# Patient Record
Sex: Female | Born: 1940 | Race: White | Hispanic: No | Smoking: Former smoker
Health system: Southern US, Community
[De-identification: ages and names within clinical notes are randomized; demographics above are authoritative.]

## PROBLEM LIST (undated history)

## (undated) DIAGNOSIS — E119 Type 2 diabetes mellitus without complications: Secondary | ICD-10-CM

## (undated) DIAGNOSIS — M1990 Unspecified osteoarthritis, unspecified site: Secondary | ICD-10-CM

## (undated) DIAGNOSIS — E785 Hyperlipidemia, unspecified: Secondary | ICD-10-CM

## (undated) DIAGNOSIS — I10 Essential (primary) hypertension: Secondary | ICD-10-CM

## (undated) DIAGNOSIS — G4700 Insomnia, unspecified: Secondary | ICD-10-CM

## (undated) DIAGNOSIS — H269 Unspecified cataract: Secondary | ICD-10-CM

## (undated) DIAGNOSIS — M549 Dorsalgia, unspecified: Secondary | ICD-10-CM

## (undated) DIAGNOSIS — E039 Hypothyroidism, unspecified: Secondary | ICD-10-CM

## (undated) DIAGNOSIS — H3530 Unspecified macular degeneration: Secondary | ICD-10-CM

## (undated) DIAGNOSIS — R252 Cramp and spasm: Secondary | ICD-10-CM

## (undated) DIAGNOSIS — M797 Fibromyalgia: Secondary | ICD-10-CM

## (undated) DIAGNOSIS — J069 Acute upper respiratory infection, unspecified: Secondary | ICD-10-CM

## (undated) DIAGNOSIS — M2540 Effusion, unspecified joint: Secondary | ICD-10-CM

## (undated) DIAGNOSIS — R233 Spontaneous ecchymoses: Secondary | ICD-10-CM

## (undated) DIAGNOSIS — J42 Unspecified chronic bronchitis: Secondary | ICD-10-CM

## (undated) DIAGNOSIS — H9190 Unspecified hearing loss, unspecified ear: Secondary | ICD-10-CM

## (undated) DIAGNOSIS — IMO0001 Reserved for inherently not codable concepts without codable children: Secondary | ICD-10-CM

## (undated) DIAGNOSIS — K219 Gastro-esophageal reflux disease without esophagitis: Secondary | ICD-10-CM

## (undated) DIAGNOSIS — J302 Other seasonal allergic rhinitis: Secondary | ICD-10-CM

## (undated) DIAGNOSIS — J449 Chronic obstructive pulmonary disease, unspecified: Secondary | ICD-10-CM

## (undated) DIAGNOSIS — J4489 Other specified chronic obstructive pulmonary disease: Secondary | ICD-10-CM

## (undated) DIAGNOSIS — R238 Other skin changes: Secondary | ICD-10-CM

## (undated) DIAGNOSIS — S4290XA Fracture of unspecified shoulder girdle, part unspecified, initial encounter for closed fracture: Secondary | ICD-10-CM

## (undated) DIAGNOSIS — R3915 Urgency of urination: Secondary | ICD-10-CM

## (undated) DIAGNOSIS — J189 Pneumonia, unspecified organism: Secondary | ICD-10-CM

## (undated) DIAGNOSIS — J40 Bronchitis, not specified as acute or chronic: Secondary | ICD-10-CM

## (undated) DIAGNOSIS — G8929 Other chronic pain: Secondary | ICD-10-CM

## (undated) DIAGNOSIS — M2550 Pain in unspecified joint: Secondary | ICD-10-CM

## (undated) DIAGNOSIS — R519 Headache, unspecified: Secondary | ICD-10-CM

## (undated) DIAGNOSIS — C801 Malignant (primary) neoplasm, unspecified: Secondary | ICD-10-CM

## (undated) DIAGNOSIS — R51 Headache: Secondary | ICD-10-CM

## (undated) HISTORY — PX: BREAST BIOPSY: SHX20

## (undated) HISTORY — PX: JOINT REPLACEMENT: SHX530

## (undated) HISTORY — DX: Other specified chronic obstructive pulmonary disease: J44.89

## (undated) HISTORY — DX: Essential (primary) hypertension: I10

## (undated) HISTORY — DX: Gastro-esophageal reflux disease without esophagitis: K21.9

## (undated) HISTORY — DX: Chronic obstructive pulmonary disease, unspecified: J44.9

---

## 1970-02-16 HISTORY — PX: BREAST SURGERY: SHX581

## 2003-12-18 ENCOUNTER — Ambulatory Visit: Payer: Self-pay | Admitting: Critical Care Medicine

## 2004-04-18 ENCOUNTER — Ambulatory Visit: Payer: Self-pay | Admitting: Family Medicine

## 2004-04-25 ENCOUNTER — Ambulatory Visit: Payer: Self-pay | Admitting: Critical Care Medicine

## 2004-10-17 ENCOUNTER — Ambulatory Visit: Payer: Self-pay | Admitting: Critical Care Medicine

## 2004-12-25 ENCOUNTER — Ambulatory Visit: Payer: Self-pay | Admitting: Pulmonary Disease

## 2005-03-12 ENCOUNTER — Ambulatory Visit: Payer: Self-pay | Admitting: Critical Care Medicine

## 2005-04-07 ENCOUNTER — Ambulatory Visit: Payer: Self-pay | Admitting: Pulmonary Disease

## 2005-04-15 ENCOUNTER — Ambulatory Visit: Payer: Self-pay | Admitting: Critical Care Medicine

## 2005-05-25 ENCOUNTER — Ambulatory Visit: Payer: Self-pay | Admitting: Critical Care Medicine

## 2005-10-15 ENCOUNTER — Ambulatory Visit: Payer: Self-pay | Admitting: Critical Care Medicine

## 2005-11-23 ENCOUNTER — Ambulatory Visit: Payer: Self-pay | Admitting: Critical Care Medicine

## 2006-01-27 ENCOUNTER — Ambulatory Visit: Payer: Self-pay | Admitting: Critical Care Medicine

## 2006-02-10 ENCOUNTER — Ambulatory Visit: Payer: Self-pay | Admitting: Critical Care Medicine

## 2006-04-13 ENCOUNTER — Ambulatory Visit: Payer: Self-pay | Admitting: Critical Care Medicine

## 2006-05-13 ENCOUNTER — Ambulatory Visit: Payer: Self-pay | Admitting: Critical Care Medicine

## 2006-06-16 ENCOUNTER — Ambulatory Visit: Payer: Self-pay | Admitting: Pulmonary Disease

## 2006-10-07 ENCOUNTER — Ambulatory Visit: Payer: Self-pay | Admitting: Critical Care Medicine

## 2006-11-01 ENCOUNTER — Ambulatory Visit: Payer: Self-pay | Admitting: Critical Care Medicine

## 2006-11-02 DIAGNOSIS — K219 Gastro-esophageal reflux disease without esophagitis: Secondary | ICD-10-CM

## 2006-11-02 DIAGNOSIS — J449 Chronic obstructive pulmonary disease, unspecified: Secondary | ICD-10-CM

## 2006-12-07 ENCOUNTER — Ambulatory Visit: Payer: Self-pay | Admitting: Critical Care Medicine

## 2007-01-11 ENCOUNTER — Ambulatory Visit: Payer: Self-pay | Admitting: Critical Care Medicine

## 2007-02-23 ENCOUNTER — Ambulatory Visit: Payer: Self-pay | Admitting: Critical Care Medicine

## 2007-02-23 DIAGNOSIS — I10 Essential (primary) hypertension: Secondary | ICD-10-CM | POA: Insufficient documentation

## 2007-03-22 ENCOUNTER — Ambulatory Visit: Payer: Self-pay | Admitting: Critical Care Medicine

## 2007-04-13 ENCOUNTER — Ambulatory Visit: Payer: Self-pay | Admitting: Critical Care Medicine

## 2007-07-13 ENCOUNTER — Ambulatory Visit: Payer: Self-pay | Admitting: Critical Care Medicine

## 2007-08-16 ENCOUNTER — Ambulatory Visit: Payer: Self-pay | Admitting: Critical Care Medicine

## 2007-09-05 ENCOUNTER — Ambulatory Visit: Payer: Self-pay | Admitting: Internal Medicine

## 2007-09-29 ENCOUNTER — Ambulatory Visit: Payer: Self-pay | Admitting: Critical Care Medicine

## 2007-11-07 ENCOUNTER — Ambulatory Visit: Payer: Self-pay | Admitting: Critical Care Medicine

## 2007-12-14 ENCOUNTER — Ambulatory Visit: Payer: Self-pay | Admitting: Critical Care Medicine

## 2008-02-16 ENCOUNTER — Ambulatory Visit: Payer: Self-pay | Admitting: Critical Care Medicine

## 2008-04-12 ENCOUNTER — Telehealth (INDEPENDENT_AMBULATORY_CARE_PROVIDER_SITE_OTHER): Payer: Self-pay | Admitting: *Deleted

## 2008-04-25 ENCOUNTER — Ambulatory Visit: Payer: Self-pay | Admitting: Critical Care Medicine

## 2008-05-14 ENCOUNTER — Telehealth: Payer: Self-pay | Admitting: Adult Health

## 2008-05-17 ENCOUNTER — Ambulatory Visit: Payer: Self-pay | Admitting: Critical Care Medicine

## 2008-06-26 ENCOUNTER — Ambulatory Visit: Payer: Self-pay | Admitting: Critical Care Medicine

## 2008-06-28 ENCOUNTER — Telehealth: Payer: Self-pay | Admitting: Adult Health

## 2008-08-22 ENCOUNTER — Ambulatory Visit: Payer: Self-pay | Admitting: Internal Medicine

## 2008-08-22 ENCOUNTER — Ambulatory Visit: Payer: Self-pay | Admitting: Critical Care Medicine

## 2008-08-22 ENCOUNTER — Telehealth: Payer: Self-pay | Admitting: Adult Health

## 2008-09-12 ENCOUNTER — Ambulatory Visit: Payer: Self-pay | Admitting: Critical Care Medicine

## 2008-09-17 ENCOUNTER — Telehealth (INDEPENDENT_AMBULATORY_CARE_PROVIDER_SITE_OTHER): Payer: Self-pay | Admitting: *Deleted

## 2008-10-12 ENCOUNTER — Ambulatory Visit: Payer: Self-pay | Admitting: Pulmonary Disease

## 2008-11-14 ENCOUNTER — Telehealth: Payer: Self-pay | Admitting: Critical Care Medicine

## 2008-12-11 ENCOUNTER — Ambulatory Visit: Payer: Self-pay | Admitting: Critical Care Medicine

## 2008-12-31 ENCOUNTER — Telehealth: Payer: Self-pay | Admitting: Critical Care Medicine

## 2009-01-09 ENCOUNTER — Telehealth: Payer: Self-pay | Admitting: Critical Care Medicine

## 2009-01-09 ENCOUNTER — Encounter: Payer: Self-pay | Admitting: Critical Care Medicine

## 2009-02-04 ENCOUNTER — Ambulatory Visit: Payer: Self-pay | Admitting: Critical Care Medicine

## 2009-02-18 ENCOUNTER — Ambulatory Visit: Payer: Self-pay | Admitting: Cardiology

## 2009-02-18 ENCOUNTER — Ambulatory Visit: Payer: Self-pay | Admitting: Critical Care Medicine

## 2009-04-05 ENCOUNTER — Ambulatory Visit: Payer: Self-pay | Admitting: Critical Care Medicine

## 2009-05-09 ENCOUNTER — Ambulatory Visit: Payer: Self-pay | Admitting: Critical Care Medicine

## 2009-05-20 ENCOUNTER — Ambulatory Visit: Payer: Self-pay | Admitting: Critical Care Medicine

## 2009-05-27 ENCOUNTER — Telehealth (INDEPENDENT_AMBULATORY_CARE_PROVIDER_SITE_OTHER): Payer: Self-pay | Admitting: *Deleted

## 2009-06-10 ENCOUNTER — Ambulatory Visit: Payer: Self-pay | Admitting: Critical Care Medicine

## 2009-07-22 ENCOUNTER — Emergency Department: Payer: Self-pay | Admitting: Emergency Medicine

## 2009-08-06 ENCOUNTER — Ambulatory Visit: Payer: Self-pay | Admitting: Critical Care Medicine

## 2009-10-14 ENCOUNTER — Telehealth: Payer: Self-pay | Admitting: Critical Care Medicine

## 2009-10-29 ENCOUNTER — Telehealth (INDEPENDENT_AMBULATORY_CARE_PROVIDER_SITE_OTHER): Payer: Self-pay | Admitting: *Deleted

## 2009-11-11 ENCOUNTER — Ambulatory Visit: Payer: Self-pay | Admitting: Critical Care Medicine

## 2009-12-11 ENCOUNTER — Ambulatory Visit: Payer: Self-pay | Admitting: Critical Care Medicine

## 2010-03-07 ENCOUNTER — Ambulatory Visit
Admission: RE | Admit: 2010-03-07 | Discharge: 2010-03-07 | Payer: Self-pay | Source: Home / Self Care | Attending: Critical Care Medicine | Admitting: Critical Care Medicine

## 2010-03-18 NOTE — Progress Notes (Signed)
Summary: pt req neb order now/ pt at med supply  Phone Note Call from Patient   Caller: Patient Call For: wright Summary of Call: pt is at [facility] now, requesting that an ordere for new nebulizor as well as supplies/ kit for this be faxed to [phone] attn: nicki. her  (nicki) contact # at [facility] is: [phone].  pt needs this now as she will not be able to come back tomorrow (spouse will have surgery). call pt's cell at [phone]. NOTE: order needs to state how long pt needs to be on neb (pt states "forever") Initial call taken by: Tivis Ringer, CNA,  May 27, 2009 11:24 AM  Follow-up for Phone Call        Please advise if ok to place order for new neb machine and supplies. Pt staets her machine is from 2003 and is has broken. Pt aware PW in office tomorrow.[name] CMA  May 27, 2009 1:25 PM   Additional Follow-up for Phone Call Additional follow up Details #1::        this is ok  Additional Follow-up by: Storm Frisk MD,  May 27, 2009 2:19 PM    Additional Follow-up for Phone Call Additional follow up Details #2::    Order was sent to [facility]. Vernie Murders  May 27, 2009 2:38 PM

## 2010-03-18 NOTE — Progress Notes (Signed)
Summary: fyi  Phone Note Call from Patient   Caller: Patient Call For: wright Summary of Call: pt started taking avelox 09/29/09. pls add to med list Initial call taken by: Rickard Patience,  October 14, 2009 1:40 PM  Follow-up for Phone Call        Spoke with pt.  She states Dr. Delford Field gave her avelox rx to keep on hand.  Would like him to be aware she had to take it d/t sinusitis.  Started on 09/29/09 and finished on 10/03/09.  Will forward message to PW as FYI.   Follow-up by: Gweneth Dimitri RN,  October 14, 2009 1:51 PM  Additional Follow-up for Phone Call Additional follow up Details #1::        noted Additional Follow-up by: Storm Frisk MD,  October 14, 2009 3:55 PM

## 2010-03-18 NOTE — Assessment & Plan Note (Signed)
Summary: Pulmonary OV   Primary Provider/Referring Provider:  Dr. Leim Fabry in [place]  CC:  2 wk follow up.  states breathing is better and still having head congestion with very little green mucus and sinus pressure. Marland Kitchen  History of Present Illness: This is a 70 yo WF with hx of asthmatic bronchitis  10/28 At the last ov we Rx: Rocephin injection will be given Avelox for 7 days one daily  prednisone 10mg  for 12 days  4 each am x3days, 3 x 3days, 2 x 3days, 1 x 3days then stop ambien as needed sleep Pt now at baseline and markedly better.  February 16, 2008 9:49 AM no changes made at last OV two weeks of nose bleeds and nose is congested saline opens but not getting it out and now draining did not want H1N1 vaccine    April 25, 2008 10:06 AM Pt developed sinus infection symptoms and Augmentin called in 2/25.  Pt now better.  Less sinus drainage.  Less cough.  No chest pain. Dyspnea is better.  May 17, 2008--Presents for an acute office viist. Complains of prod cough with green/yellow mucus, increased SOB, head congestion with PND, wheezing x2weeks, made worse with the warm/dry weather.  --RX Augmentin x 7 d  Jun 26, 2008--Presents for acute office viist. Complains of  bronchitis - prod cough(green) - temp 101 last hs, increased productive cough for 1 week, green thick mucous, w/ fever. . No otc used. Previously trial of symbicort, did not tolerate due to sore mouth/tongue. Cant afford Spiriva- will place in [place].    August 22, 2008 --Presents for an acute office visit. prod cough with small amounts of green-yellow sputum, burning and soreness in bronchial tubes, sinus congestion x1day. Mild wheezing. Seen last visit, slow to resolve bronchitis required doxcycline and avelox w/ steroid taper. Symptoms resolved. Did well over last month until last 2 days. Temps outside  ~100 F. causing her breahting to be worse. Denies chest pain,  orthopnea, hemoptysis, fever, n/v/d,  edema, headache.  September 12, 2008 11:09 AM Hot weather is an issue now with dyspnea.    NP saw pt on 7/7 and rx: Levaquin 500mg  once daily for 7 days Mucinex DM two times a day as needed  Prednisone taper over next week.  CXR was neg.  Pt took [place] due to shakiness with prednisone and levaquin. Now has slight cough, no wheeze, no mucous now.    October 12, 2008  (ALVA) C/o hoarseness, cough  with yellow phlegm & 'burning in her bronchial tubes'. No wheezing, feels like her 'bronchitis is coming on'. No pain in chest area.   Pt denies  nasal congestion or excess secretions, fever, chills, sweats, unintended weight loss, pleurtic or exertional chest pain, orthopnea PND, or leg swelling   December 11, 2008 2:35 PM Pt saw Vassie Loll and had tussionex refilled and rx amoxicillin The pt has twice has had issues with cough and used mucinex DM and nasacort Will cough more overall,  cough is dry mostly.  Had hoarseness last week. If uses the Advair will make the pt cough Has insomnia as well and sister is ill and is under stress  February 04, 2009 9:58 AM Noting more cough and dyspnea.  This am with  severe cough paroxysms.  Neb Med help.  Coughs for No chest pain.  Burns in anterior chest area. Notes Headaches and eyes water.  Mucous out of nose is green.  Notes more wheeze. Is  using proair more.   February 18, 2009 9:43 AM Still has mucous in back of head, still with pn drip, notes some headaches. The pt notes the  top of head and over eyes is sore.   The pt now has one pred left. Her cough overall is better.  The dyspnea is better.  There is no chest pain.  The pt has not had a recent CT sinus.  Preventive Screening-Counseling & Management  Alcohol-Tobacco     Smoking Status: quit > 6 months  Current Medications (verified): 1)  Symbicort 160-4.5 Mcg/act Aero (Budesonide-Formoterol Fumarate) .... Inhale 2 Puffs Two Times A Day 2)  Fluticasone Propionate 50 Mcg/act Susp (Fluticasone  Propionate) .... 2 Puffs Two Times A Day 3)  Nexium 40 Mg  Cpdr (Esomeprazole Magnesium) .... One Daily 4)  Diclofenac Sodium 75 Mg  Tbec (Diclofenac Sodium) .... One By Mouth Twice Daily 5)  Zolpidem Tartrate 10 Mg Tabs (Zolpidem Tartrate) .... At Bedtime 6)  Triamterene-Hctz 37.5-25 Mg  Caps (Triamterene-Hctz) .... One By Mouth Once Daily 7)  Proair Hfa 108 (90 Base) Mcg/act  Aers (Albuterol Sulfate) .... One To 2 Puffs  Every 4 Hours As Needed 8)  Nystatin 100000 Unit/ml  Susp (Nystatin) .... 5 Ccc Three Times A Day As Needed 9)  Tussionex Pennkinetic Er 8-10 Mg/17ml  Lqcr (Chlorpheniramine-Hydrocodone) .Marland Kitchen.. 1 Tsp Every 12 Hrs As Needed Cough 10)  Mucinex Dm Maximum Strength 60-1200 Mg Xr12h-Tab (Dextromethorphan-Guaifenesin) .Marland Kitchen.. 1 By Mouth Two Times A Day As Needed 11)  Prednisone 10 Mg  Tabs (Prednisone) .... Take As Directed 4 Each Am X 4 Days, 3 X 4 Days, 2 X 4 Days, 1 X 4 Days Then Stop 12)  Amlodipine Besylate 5 Mg Tabs (Amlodipine Besylate) .... Once Daily  Allergies (verified): No Known Drug Allergies  Past History:  Past medical, surgical, family and social histories (including risk factors) reviewed, and no changes noted (except as noted below).  Past Medical History: BRONCHITIS, ACUTE (ICD-466.0) HYPERTENSION (ICD-401.9) GERD (ICD-530.81) COPD (ICD-496)  -FeV1 67%  DLCO 65%  2007  -CT Sinus neg 02/2009  Past Pulmonary History:  Pulmonary History: CT Sinus neg 02/2009  Family History: Reviewed history from 02/23/2007 and no changes required. MI/Heart Attack  Social History: Reviewed history from 09/05/2007 and no changes required. Patient states former smoker.  Divorced (currently engaged) 1 son Retired-prev. worked at [employer]  Review of Systems       The patient complains of shortness of breath with activity, non-productive cough, chest pain, headaches, and nasal congestion/difficulty breathing through nose.  The patient denies shortness of breath at rest,  productive cough, coughing up blood, irregular heartbeats, acid heartburn, indigestion, loss of appetite, weight change, abdominal pain, difficulty swallowing, sore throat, tooth/dental problems, sneezing, itching, ear ache, anxiety, depression, hand/feet swelling, joint stiffness or pain, rash, change in color of mucus, and fever.    Vital Signs:  Patient profile:   70 year old female Height:      64 inches Weight:      177.25 pounds BMI:     30.53 O2 Sat:      96 % on Room air Temp:     97.5 degrees F oral Pulse rate:   86 / minute BP sitting:   136 / 90  (left arm) Cuff size:   regular  Vitals Entered By: Gweneth Dimitri RN (February 18, 2009 9:37 AM)  O2 Flow:  Room air CC: 2 wk follow up.  states breathing is better,  still having head congestion with very little green mucus and sinus pressure.  Comments Medications reviewed with patient Gweneth Dimitri RN  February 18, 2009 9:37 AM    Physical Exam  Additional Exam:  Gen. Pleasant, well-nourished, in no distress ENT - improved purulence but persistent erythema and nasal narrowing  Neck: No JVD, no thyromegaly, no carotid bruits Lungs: no use of accessory muscles, no dullness to percussion, improved airflow Cardiovascular: Rhythm regular, heart sounds  normal, no murmurs or gallops, no peripheral edema Musculoskeletal: No deformities, no cyanosis or clubbing      Impression & Recommendations:  Problem # 1:  OTHER ACUTE SINUSITIS (ICD-461.8) Assessment Improved Acute sinusitis  ? chronic component however CT Sinus performed today 02/18/09 was neg for chronic disease plan cont nasal hygiene finish prednisone no further ABX for now  Her updated medication list for this problem includes:    Fluticasone Propionate 50 Mcg/act Susp (Fluticasone propionate) .Marland Kitchen... 2 puffs once daily    Tussionex Pennkinetic Er 8-10 Mg/4ml Lqcr (Chlorpheniramine-hydrocodone) .Marland Kitchen... 1 tsp every 12 hrs as needed cough    Mucinex Dm Maximum Strength  60-1200 Mg Xr12h-tab (Dextromethorphan-guaifenesin) .Marland Kitchen... 1 by mouth two times a day as needed  Orders: Est. Patient Level IV (16109) Prescription Created Electronically [phone]) Radiology Referral (Radiology)  Problem # 2:  COPD (ICD-496) Assessment: Unchanged Stable COPD  with  AB flare now improved,  no insurance coverage for [facility] available so pt to continue symbicort due to sinusitis plan No change in inhaled medications.   Maintain treatment program as currently prescribed.  Medications Added to Medication List This Visit: 1)  Fluticasone Propionate 50 Mcg/act Susp (Fluticasone propionate) .... 2 puffs once daily 2)  Zolpidem Tartrate 10 Mg Tabs (Zolpidem tartrate) .... At bedtime 3)  Amlodipine Besylate 5 Mg Tabs (Amlodipine besylate) .... Once daily  Complete Medication List: 1)  Symbicort 160-4.5 Mcg/act Aero (Budesonide-formoterol fumarate) .... Inhale 2 puffs two times a day 2)  Fluticasone Propionate 50 Mcg/act Susp (Fluticasone propionate) .... 2 puffs once daily 3)  Nexium 40 Mg Cpdr (Esomeprazole magnesium) .... One daily 4)  Diclofenac Sodium 75 Mg Tbec (Diclofenac sodium) .... One by mouth twice daily 5)  Zolpidem Tartrate 10 Mg Tabs (Zolpidem tartrate) .... At bedtime 6)  Triamterene-hctz 37.5-25 Mg Caps (Triamterene-hctz) .... One by mouth once daily 7)  Proair Hfa 108 (90 Base) Mcg/act Aers (Albuterol sulfate) .... One to 2 puffs  every 4 hours as needed 8)  Nystatin 100000 Unit/ml Susp (Nystatin) .... 5 ccc three times a day as needed 9)  Tussionex Pennkinetic Er 8-10 Mg/70ml Lqcr (Chlorpheniramine-hydrocodone) .Marland Kitchen.. 1 tsp every 12 hrs as needed cough 10)  Mucinex Dm Maximum Strength 60-1200 Mg Xr12h-tab (Dextromethorphan-guaifenesin) .Marland Kitchen.. 1 by mouth two times a day as needed 11)  Prednisone 10 Mg Tabs (Prednisone) .... Take as directed 4 each am x 4 days, 3 x 4 days, 2 x 4 days, 1 x 4 days then stop 12)  Amlodipine Besylate 5 Mg Tabs (Amlodipine besylate) .... Once  daily  Patient Instructions: 1)  A CT Scan of the Sinuses will be scheduled at [place]  2)  Stay on symbicort,  do not refill Dulera 3)  Stay on fluticasone/nasacort two sprays once a day each nostril 4)  Return one month  Prescriptions: FLUTICASONE PROPIONATE 50 MCG/ACT SUSP (FLUTICASONE PROPIONATE) 2 puffs two times a day  #1 month x 6   Entered and Authorized by:   Storm Frisk MD   Signed by:  Storm Frisk MD on 02/18/2009   Method used:   Electronically to        [facility] (retail)       [address]       [place], [place]  [id number]       Ph: [phone]       Fax: [phone]   RxID:   [id number]

## 2010-03-18 NOTE — Assessment & Plan Note (Signed)
Summary: Pulmonary OV   Primary Provider/Referring Provider:  Dr. Leim Fabry in [place]  CC:  2 wk follow up.  States breathing is doing well overall.  states gerd is better.  states she does have a prod cough with a small amount of thick white mucus.  States she is able to get  up a lot more mucus after doing neb treatments - still thick white.  Marland Kitchen  History of Present Illness: This is a 70 yo WF with hx of asthmatic bronchitis   June 10, 2009 9:49 AM At last ov we: Change flonase to veramyst two sprays each nostril daily Stop Nexium Use Dexilant one daily Follow a strict Reflux Diet Since last ov,  lost 4# and was eating poorly,  still with thick mucous,  had sore throat last week,  muocus is white,  is coughing more,  some sl green to white,. If does a neb will get up mucous  .   Preventive Screening-Counseling & Management  Alcohol-Tobacco     Smoking Status: quit > 6 months  Current Medications (verified): 1)  Symbicort 160-4.5 Mcg/act Aero (Budesonide-Formoterol Fumarate) .... Inhale 2 Puffs Two Times A Day 2)  Veramyst 27.5 Mcg/spray  Susp (Fluticasone Furoate) .... Two Puffs Each Nostril Daily 3)  Dexilant 60 Mg Cpdr (Dexlansoprazole) .... One By Mouth Daily 4)  Diclofenac Sodium 75 Mg  Tbec (Diclofenac Sodium) .... One By Mouth Twice Daily 5)  Zolpidem Tartrate 10 Mg Tabs (Zolpidem Tartrate) .... At Bedtime 6)  Triamterene-Hctz 37.5-25 Mg  Caps (Triamterene-Hctz) .... One By Mouth Once Daily 7)  Proair Hfa 108 (90 Base) Mcg/act  Aers (Albuterol Sulfate) .... One To 2 Puffs  Every 4 Hours As Needed 8)  Nystatin 100000 Unit/ml  Susp (Nystatin) .... 5 Ccc Three Times A Day As Needed 9)  Tussionex Pennkinetic Er 8-10 Mg/100ml  Lqcr (Chlorpheniramine-Hydrocodone) .Marland Kitchen.. 1 Tsp Every 12 Hrs As Needed Cough 10)  Mucinex Dm Maximum Strength 60-1200 Mg Xr12h-Tab (Dextromethorphan-Guaifenesin) .Marland Kitchen.. 1 By Mouth Two Times A Day As Needed 11)  Amlodipine Besylate 5 Mg Tabs  (Amlodipine Besylate) .... Once Daily 12)  Ipratropium Bromide 0.02 % Soln (Ipratropium Bromide) .Marland Kitchen.. 1 Vial in Nebulizer Two Times A Day and As Needed 13)  Albuterol Sulfate (2.5 Mg/34ml) 0.083% Nebu (Albuterol Sulfate) .Marland Kitchen.. 1 in Nebulizer With Ipratropium Two Times A Day As Needed  Allergies (verified): 1)  ! Prednisone  Past History:  Past medical, surgical, family and social histories (including risk factors) reviewed, and no changes noted (except as noted below).  Past Medical History: Reviewed history from 02/18/2009 and no changes required. BRONCHITIS, ACUTE (ICD-466.0) HYPERTENSION (ICD-401.9) GERD (ICD-530.81) COPD (ICD-496)  -FeV1 67%  DLCO 65%  2007  -CT Sinus neg 02/2009  Past Pulmonary History:  Pulmonary History: CT Sinus neg 02/2009  Family History: Reviewed history from 02/23/2007 and no changes required. MI/Heart Attack  Social History: Reviewed history from 09/05/2007 and no changes required. Patient states former smoker.  Divorced (currently engaged) 1 son Retired-prev. worked at [employer]  Review of Systems       The patient complains of shortness of breath with activity, productive cough, and nasal congestion/difficulty breathing through nose.  The patient denies shortness of breath at rest, non-productive cough, coughing up blood, chest pain, irregular heartbeats, acid heartburn, indigestion, loss of appetite, weight change, abdominal pain, difficulty swallowing, sore throat, tooth/dental problems, headaches, sneezing, itching, ear ache, anxiety, depression, hand/feet swelling, joint stiffness or pain, rash, change in color of mucus, and  fever.    Vital Signs:  Patient profile:   70 year old female Height:      64 inches Weight:      167.50 pounds BMI:     28.86 O2 Sat:      95 % on Room air Temp:     97.6 degrees F oral Pulse rate:   75 / minute BP sitting:   118 / 78  (left arm) Cuff size:   regular  Vitals Entered By: Gweneth Dimitri RN (June 10, 2009 9:40 AM)  O2 Flow:  Room air  CC: 2 wk follow up.  States breathing is doing well overall.  states gerd is better.  states she does have a prod cough with a small amount of thick white mucus.  States she is able to get  up a lot more mucus after doing neb treatments - still thick white.   Comments Medications reviewed with patient Daytime contact number verified with patient. Gweneth Dimitri RN  June 10, 2009 9:45 AM    Physical Exam  Additional Exam:  Gen. Pleasant, well-nourished, in no distress ENT - improved purulence but persistent erythema and nasal narrowing  Neck: No JVD, no thyromegaly, no carotid bruits Lungs: no use of accessory muscles, no dullness to percussion, improved airflow.   Cardiovascular: Rhythm regular, heart sounds  normal, no murmurs or gallops, no peripheral edema Musculoskeletal: No deformities, no cyanosis or clubbing      Impression & Recommendations:  Problem # 1:  COPD (ICD-496) Assessment Improved stable chronic obstructive lung disease with decrease airway inflammation reflux disease is likely playing a role. Plan Continue reflux treatment. Continue inhaled medications as prescribed  Medications Added to Medication List This Visit: 1)  Flonase 50 Mcg/act Susp (Fluticasone propionate) .... Two puffs each nostril daily 2)  Nexium 40 Mg Cpdr (Esomeprazole magnesium) .... By mouth daily. take one half hour before eating.  Complete Medication List: 1)  Symbicort 160-4.5 Mcg/act Aero (Budesonide-formoterol fumarate) .... Inhale 2 puffs two times a day 2)  Flonase 50 Mcg/act Susp (Fluticasone propionate) .... Two puffs each nostril daily 3)  Nexium 40 Mg Cpdr (Esomeprazole magnesium) .... By mouth daily. take one half hour before eating. 4)  Diclofenac Sodium 75 Mg Tbec (Diclofenac sodium) .... One by mouth twice daily 5)  Zolpidem Tartrate 10 Mg Tabs (Zolpidem tartrate) .... At bedtime 6)  Triamterene-hctz 37.5-25 Mg Caps (Triamterene-hctz) ....  One by mouth once daily 7)  Proair Hfa 108 (90 Base) Mcg/act Aers (Albuterol sulfate) .... One to 2 puffs  every 4 hours as needed 8)  Nystatin 100000 Unit/ml Susp (Nystatin) .... 5 ccc three times a day as needed 9)  Tussionex Pennkinetic Er 8-10 Mg/13ml Lqcr (Chlorpheniramine-hydrocodone) .Marland Kitchen.. 1 tsp every 12 hrs as needed cough 10)  Mucinex Dm Maximum Strength 60-1200 Mg Xr12h-tab (Dextromethorphan-guaifenesin) .Marland Kitchen.. 1 by mouth two times a day as needed 11)  Amlodipine Besylate 5 Mg Tabs (Amlodipine besylate) .... Once daily 12)  Ipratropium Bromide 0.02 % Soln (Ipratropium bromide) .Marland Kitchen.. 1 vial in nebulizer two times a day and as needed 13)  Albuterol Sulfate (2.5 Mg/40ml) 0.083% Nebu (Albuterol sulfate) .Marland Kitchen.. 1 in nebulizer with ipratropium two times a day as needed  Other Orders: Est. Patient Level III (04540)  Patient Instructions: 1)  May resume flonase and nexium 2)  Continue reflux diet 3)  Use avelox samples one daily if green mucous continues 4)  Use nebulizer three times daily 5)  Return 2 months

## 2010-03-18 NOTE — Assessment & Plan Note (Signed)
Summary: Pulmonary OV   Primary Provider/Referring Provider:  Dr. Leim Fabry in [place]  CC:  Acute Visit.  PND, wheezing, increased SOB, and prod cough with beige/green mucus since Saturday.  Denies f/c/s.Marland Kitchen  History of Present Illness: This is a 70 yo WF with hx of asthmatic bronchitis  August 06, 2009 11:05 AM Had to fill avelox last week for bronchitis.  Has had two flareups since 4/11.  Self Rx pred pulse  at first and got better.   Then fell and fx arm on a sunday at church and went to ED.  Then one week ago was at the [facility] and got worse with hoarseness and felt worse and then took 5 d of avelox.  Now is better.  Fx is in the humerus.  Being treated conservatively. Now:  notes sl wheeze at night,  will cough occasionally.  Ortho is Dr [facility] in [place].  November 11, 2009 2:57 PM Note onset pn drip and burning and drainage in throat.  cough is more productive beige green.  Gets ill fast.  Notes more dyspnea, not bad.  No f/c/s.  Notes sinus pressure over R eye. Cough is worse.  No chest pain.  No f/c/s.    Preventive Screening-Counseling & Management  Alcohol-Tobacco     Smoking Status: quit > 6 months     Year Quit: 2003     Pack years: 77yrs, 2 ppd  Current Medications (verified): 1)  Symbicort 160-4.5 Mcg/act Aero (Budesonide-Formoterol Fumarate) .... Inhale 2 Puffs Two Times A Day 2)  Flonase 50 Mcg/act  Susp (Fluticasone Propionate) .... Two Puffs Each Nostril Daily 3)  Nexium 40 Mg  Cpdr (Esomeprazole Magnesium) .... By Mouth Daily. Take One Half Hour Before Eating. 4)  Diclofenac Sodium 75 Mg  Tbec (Diclofenac Sodium) .... One By Mouth Twice Daily 5)  Zolpidem Tartrate 10 Mg Tabs (Zolpidem Tartrate) .... At Bedtime 6)  Triamterene-Hctz 37.5-25 Mg  Caps (Triamterene-Hctz) .... One By Mouth Once Daily 7)  Proair Hfa 108 (90 Base) Mcg/act  Aers (Albuterol Sulfate) .... One To 2 Puffs  Every 4 Hours As Needed 8)  Nystatin 100000 Unit/ml  Susp (Nystatin) .... 5  Ccc Three Times A Day As Needed 9)  Tussionex Pennkinetic Er 8-10 Mg/55ml  Lqcr (Chlorpheniramine-Hydrocodone) .Marland Kitchen.. 1 Tsp Every 12 Hrs As Needed Cough 10)  Mucinex Dm Maximum Strength 60-1200 Mg Xr12h-Tab (Dextromethorphan-Guaifenesin) .Marland Kitchen.. 1 By Mouth Two Times A Day As Needed 11)  Amlodipine Besylate 5 Mg Tabs (Amlodipine Besylate) .... Once Daily 12)  Ipratropium Bromide 0.02 % Soln (Ipratropium Bromide) .Marland Kitchen.. 1 Vial in Nebulizer Two Times A Day and As Needed 13)  Albuterol Sulfate (2.5 Mg/42ml) 0.083% Nebu (Albuterol Sulfate) .Marland Kitchen.. 1 in Nebulizer With Ipratropium Two Times A Day As Needed 14)  Hydrocodone-Acetaminophen 5-325 Mg Tabs (Hydrocodone-Acetaminophen) .... As Needed  Allergies (verified): 1)  ! Prednisone  Past History:  Past medical, surgical, family and social histories (including risk factors) reviewed, and no changes noted (except as noted below).  Past Medical History: Reviewed history from 02/18/2009 and no changes required. BRONCHITIS, ACUTE (ICD-466.0) HYPERTENSION (ICD-401.9) GERD (ICD-530.81) COPD (ICD-496)  -FeV1 67%  DLCO 65%  2007  -CT Sinus neg 02/2009  Past Pulmonary History:  Pulmonary History: CT Sinus neg 02/2009  Family History: Reviewed history from 02/23/2007 and no changes required. MI/Heart Attack  Social History: Reviewed history from 09/05/2007 and no changes required. Patient states former smoker.  Quit in 2003.  2 ppd x 30 yrs.   Divorced (  currently engaged) 1 son Retired-prev. worked at [employer]  Review of Systems       The patient complains of shortness of breath with activity, shortness of breath at rest, productive cough, non-productive cough, nasal congestion/difficulty breathing through nose, and change in color of mucus.  The patient denies coughing up blood, chest pain, irregular heartbeats, acid heartburn, indigestion, loss of appetite, weight change, abdominal pain, difficulty swallowing, sore throat, tooth/dental problems,  headaches, sneezing, itching, ear ache, anxiety, depression, hand/feet swelling, joint stiffness or pain, rash, and fever.    Vital Signs:  Patient profile:   70 year old female Height:      64 inches O2 Sat:      89 % on Room air Temp:     98.4 degrees F oral Pulse rate:   87 / minute BP sitting:   120 / 88  (left arm) Cuff size:   regular  Vitals Entered By: Gweneth Dimitri RN (November 11, 2009 2:49 PM)  O2 Flow:  Room air  O2 Sat Comments Pt arrived to exam room with o2 sat 89% RA.  After resting, o2 sat increased to 96% RA with pulse of 86. Gweneth Dimitri RN  November 11, 2009 2:52 PM  CC: Acute Visit.  PND, wheezing, increased SOB, prod cough with beige/green mucus since Saturday.  Denies f/c/s. Comments Medications reviewed with patient Daytime contact number verified with patient. Gweneth Dimitri RN  November 11, 2009 2:50 PM    Physical Exam  Additional Exam:  Gen. Pleasant, well-nourished, in no distress ENT - improved purulence but persistent erythema and nasal narrowing  Neck: No JVD, no thyromegaly, no carotid bruits Lungs: no use of accessory muscles, no dullness to percussion, expir wheeze, poor airflow Cardiovascular: Rhythm regular, heart sounds  normal, no murmurs or gallops, no peripheral edema Musculoskeletal: No deformities, no cyanosis or clubbing      Impression & Recommendations:  Problem # 1:  ACUTE BRONCHITIS (ICD-466.0) Assessment Deteriorated acute tracheobronchitis and early sinusitis plan avelox 400mg /d x 5days No change in inhaled medications.   Maintain treatment program as currently prescribed.  Medications Added to Medication List This Visit: 1)  Albuterol Sulfate (2.5 Mg/56ml) 0.083% Nebu (Albuterol sulfate) .Marland Kitchen.. 1 in nebulizer with ipratropium two times a day and  as needed 2)  Nexium 40 Mg Cpdr (Esomeprazole magnesium) .... By mouth daily. take one half hour before eating. 3)  Tussionex Pennkinetic Er 8-10 Mg/40ml Lqcr  (Chlorpheniramine-hydrocodone) .Marland Kitchen.. 1 tsp every 12 hrs as needed cough 4)  Hydrocodone-acetaminophen 5-325 Mg Tabs (Hydrocodone-acetaminophen) .... As needed not taking 5)  Avelox 400 Mg Tabs (Moxifloxacin hcl) .... By mouth daily  Complete Medication List: 1)  Albuterol Sulfate (2.5 Mg/79ml) 0.083% Nebu (Albuterol sulfate) .Marland Kitchen.. 1 in nebulizer with ipratropium two times a day and  as needed 2)  Ipratropium Bromide 0.02 % Soln (Ipratropium bromide) .Marland Kitchen.. 1 vial in nebulizer two times a day and as needed 3)  Amlodipine Besylate 5 Mg Tabs (Amlodipine besylate) .... Once daily 4)  Symbicort 160-4.5 Mcg/act Aero (Budesonide-formoterol fumarate) .... Inhale 2 puffs two times a day 5)  Flonase 50 Mcg/act Susp (Fluticasone propionate) .... Two puffs each nostril daily 6)  Nexium 40 Mg Cpdr (Esomeprazole magnesium) .... By mouth daily. take one half hour before eating. 7)  Diclofenac Sodium 75 Mg Tbec (Diclofenac sodium) .... One by mouth twice daily 8)  Zolpidem Tartrate 10 Mg Tabs (Zolpidem tartrate) .... At bedtime 9)  Triamterene-hctz 37.5-25 Mg Caps (Triamterene-hctz) .... One by mouth once  daily 10)  Tussionex Pennkinetic Er 8-10 Mg/34ml Lqcr (Chlorpheniramine-hydrocodone) .Marland Kitchen.. 1 tsp every 12 hrs as needed cough 11)  Mucinex Dm Maximum Strength 60-1200 Mg Xr12h-tab (Dextromethorphan-guaifenesin) .Marland Kitchen.. 1 by mouth two times a day as needed 12)  Hydrocodone-acetaminophen 5-325 Mg Tabs (Hydrocodone-acetaminophen) .... As needed not taking 13)  Proair Hfa 108 (90 Base) Mcg/act Aers (Albuterol sulfate) .... One to 2 puffs  every 4 hours as needed 14)  Nystatin 100000 Unit/ml Susp (Nystatin) .... 5 ccc three times a day as needed 15)  Avelox 400 Mg Tabs (Moxifloxacin hcl) .... By mouth daily  Other Orders: Est. Patient Level IV (47829) Prescription Created Electronically [phone])  Patient Instructions: 1)  Take Avelox one daily for 5days 2)  No other medication changes 3)  Return one month    Prescriptions: TUSSIONEX PENNKINETIC ER 8-10 MG/5ML  LQCR (CHLORPHENIRAMINE-HYDROCODONE) 1 tsp every 12 hrs as needed cough  #240 ML x 3   Entered and Authorized by:   Storm Frisk MD   Signed by:   Storm Frisk MD on 11/11/2009   Method used:   Print then Give to Patient   RxID:   [id number] NEXIUM 40 MG  CPDR (ESOMEPRAZOLE MAGNESIUM) By mouth daily. Take one half hour before eating.  #30 x 6   Entered and Authorized by:   Storm Frisk MD   Signed by:   Storm Frisk MD on 11/11/2009   Method used:   Electronically to        [facility] (retail)       [address]       [place], [place]  [id number]       Ph: [phone]       Fax: [phone]   RxID:   [id number] AVELOX 400 MG  TABS (MOXIFLOXACIN HCL) By mouth daily  #4 x 0   Entered and Authorized by:   Storm Frisk MD   Signed by:   Storm Frisk MD on 11/11/2009   Method used:   Electronically to        [facility] (retail)       [address]       [place], [place]  [id number]       Ph: [phone]       Fax: [phone]   RxID:   [id number]     Appended Document: Pulmonary OV fax Leim Fabry [place]

## 2010-03-18 NOTE — Assessment & Plan Note (Signed)
Summary: Pulmonary OV   Primary Provider/Referring Provider:  Dr. Leim Fabry in [place]  CC:  1 month follow up.  "some SOB in the evenings but overall breathing doing fine."  Nonprod cough.  Denies wheezing and chest tightness.  Marland Kitchen  History of Present Illness: This is a 70 yo WF with hx of asthmatic bronchitis  August 06, 2009 11:05 AM Had to fill avelox last week for bronchitis.  Has had two flareups since 4/11.  Self Rx pred pulse  at first and got better.   Then fell and fx arm on a sunday at church and went to ED.  Then one week ago was at the [facility] and got worse with hoarseness and felt worse and then took 5 d of avelox.  Now is better.  Fx is in the humerus.  Being treated conservatively. Now:  notes sl wheeze at night,  will cough occasionally.  Ortho is Dr Kennith [facility] in [place].  November 11, 2009 2:57 PM Note onset pn drip and burning and drainage in throat.  cough is more productive beige green.  Gets ill fast.  Notes more dyspnea, not bad.  No f/c/s.  Notes sinus pressure over R eye. Cough is worse.  No chest pain.  No f/c/s.  December 11, 2009 2:25 PM Pt is doing well vs last ov. The pt coughs  minimally the pt is more dyspneic in PM  Current Medications (verified): 1)  Albuterol Sulfate (2.5 Mg/33ml) 0.083% Nebu (Albuterol Sulfate) .Marland Kitchen.. 1 in Nebulizer With Ipratropium Two Times A Day and  As Needed 2)  Ipratropium Bromide 0.02 % Soln (Ipratropium Bromide) .Marland Kitchen.. 1 Vial in Nebulizer Two Times A Day and As Needed 3)  Amlodipine Besylate 5 Mg Tabs (Amlodipine Besylate) .... Once Daily 4)  Symbicort 160-4.5 Mcg/act Aero (Budesonide-Formoterol Fumarate) .... Inhale 2 Puffs Two Times A Day 5)  Flonase 50 Mcg/act  Susp (Fluticasone Propionate) .... Two Puffs Each Nostril Daily 6)  Nexium 40 Mg  Cpdr (Esomeprazole Magnesium) .... By Mouth Daily. Take One Half Hour Before Eating. 7)  Diclofenac Sodium 75 Mg  Tbec (Diclofenac Sodium) .... One By Mouth Twice Daily 8)  Zolpidem  Tartrate 10 Mg Tabs (Zolpidem Tartrate) .... At Bedtime 9)  Triamterene-Hctz 37.5-25 Mg  Caps (Triamterene-Hctz) .... One By Mouth Once Daily 10)  Tussionex Pennkinetic Er 8-10 Mg/25ml  Lqcr (Chlorpheniramine-Hydrocodone) .Marland Kitchen.. 1 Tsp Every 12 Hrs As Needed Cough 11)  Mucinex Dm Maximum Strength 60-1200 Mg Xr12h-Tab (Dextromethorphan-Guaifenesin) .Marland Kitchen.. 1 By Mouth Two Times A Day As Needed 12)  Hydrocodone-Acetaminophen 5-325 Mg Tabs (Hydrocodone-Acetaminophen) .... As Needed Not Taking 13)  Proair Hfa 108 [phone] Base) Mcg/act  Aers (Albuterol Sulfate) .... One To 2 Puffs  Every 4 Hours As Needed 14)  Nystatin 100000 Unit/ml  Susp (Nystatin) .... 5 Ccc Three Times A Day As Needed  Allergies (verified): 1)  ! Prednisone  Past History:  Past medical, surgical, family and social histories (including risk factors) reviewed, and no changes noted (except as noted below).  Past Medical History: Reviewed history from 02/18/2009 and no changes required. BRONCHITIS, ACUTE (ICD-466.0) HYPERTENSION (ICD-401.9) GERD (ICD-530.81) COPD (ICD-496)  -FeV1 67%  DLCO 65%  2007  -CT Sinus neg 02/2009  Past Pulmonary History:  Pulmonary History: CT Sinus neg 02/2009  Family History: Reviewed history from 02/23/2007 and no changes required. MI/Heart Attack  Social History: Reviewed history from 11/11/2009 and no changes required. Patient states former smoker.  Quit in 2003.  2 ppd x 30 yrs.  Divorced (currently engaged) 1 son Retired-prev. worked at [employer]  Review of Systems       The patient complains of shortness of breath with activity.  The patient denies shortness of breath at rest, productive cough, non-productive cough, coughing up blood, chest pain, irregular heartbeats, acid heartburn, indigestion, loss of appetite, weight change, abdominal pain, difficulty swallowing, sore throat, tooth/dental problems, headaches, nasal congestion/difficulty breathing through nose, sneezing, itching, ear ache,  anxiety, depression, hand/feet swelling, joint stiffness or pain, rash, change in color of mucus, and fever.    Vital Signs:  Patient profile:   70 year old female Height:      64 inches Weight:      167.50 pounds O2 Sat:      95 % on Room air Temp:     98.3 degrees F oral Pulse rate:   77 / minute BP sitting:   118 / 72  (right arm) Cuff size:   regular  Vitals Entered By: Gweneth Dimitri RN (December 11, 2009 2:00 PM)  O2 Flow:  Room air CC: 1 month follow up.  "some SOB in the evenings but overall breathing doing fine."  Nonprod cough.  Denies wheezing and chest tightness.   Comments Medications reviewed with patient Daytime contact number verified with patient. Gweneth Dimitri RN  December 11, 2009 2:00 PM     Physical Exam  Additional Exam:  Gen. Pleasant, well-nourished, in no distress ENT - improved purulence but persistent erythema and nasal narrowing  Neck: No JVD, no thyromegaly, no carotid bruits Lungs: no use of accessory muscles, no dullness to percussion, no wheeze Cardiovascular: Rhythm regular, heart sounds  normal, no murmurs or gallops, no peripheral edema Musculoskeletal: No deformities, no cyanosis or clubbing      Impression & Recommendations:  Problem # 1:  COPD (ICD-496) Assessment Improved improved copd/ab flare plan flu vaccine No change in inhaled medications.   Maintain treatment program as currently prescribed.  Medications Added to Medication List This Visit: 1)  Proair Hfa 108 (90 Base) Mcg/act Aers (Albuterol sulfate) .... One to 2 puffs  every 4 hours as needed may substitute  Complete Medication List: 1)  Albuterol Sulfate (2.5 Mg/3ml) 0.083% Nebu (Albuterol sulfate) .Marland Kitchen.. 1 in nebulizer with ipratropium two times a day and  as needed 2)  Ipratropium Bromide 0.02 % Soln (Ipratropium bromide) .Marland Kitchen.. 1 vial in nebulizer two times a day and as needed 3)  Amlodipine Besylate 5 Mg Tabs (Amlodipine besylate) .... Once daily 4)  Symbicort 160-4.5  Mcg/act Aero (Budesonide-formoterol fumarate) .... Inhale 2 puffs two times a day 5)  Flonase 50 Mcg/act Susp (Fluticasone propionate) .... Two puffs each nostril daily 6)  Nexium 40 Mg Cpdr (Esomeprazole magnesium) .... By mouth daily. take one half hour before eating. 7)  Diclofenac Sodium 75 Mg Tbec (Diclofenac sodium) .... One by mouth twice daily 8)  Zolpidem Tartrate 10 Mg Tabs (Zolpidem tartrate) .... At bedtime 9)  Triamterene-hctz 37.5-25 Mg Caps (Triamterene-hctz) .... One by mouth once daily 10)  Tussionex Pennkinetic Er 8-10 Mg/79ml Lqcr (Chlorpheniramine-hydrocodone) .Marland Kitchen.. 1 tsp every 12 hrs as needed cough 11)  Mucinex Dm Maximum Strength 60-1200 Mg Xr12h-tab (Dextromethorphan-guaifenesin) .Marland Kitchen.. 1 by mouth two times a day as needed 12)  Hydrocodone-acetaminophen 5-325 Mg Tabs (Hydrocodone-acetaminophen) .... As needed not taking 13)  Proair Hfa 108 (90 Base) Mcg/act Aers (Albuterol sulfate) .... One to 2 puffs  every 4 hours as needed may substitute 14)  Nystatin 100000 Unit/ml Susp (Nystatin) .... 5 ccc three  times a day as needed  Other Orders: Est. Patient Level III (27253) Flu Vaccine 54yrs + MEDICARE PATIENTS (G6440) Administration Flu vaccine - MCR (H4742)  Patient Instructions: 1)  Flu vaccine today 2)  No change in medication 3)  Return 3 months Prescriptions: PROAIR HFA 108 (90 BASE) MCG/ACT  AERS (ALBUTEROL SULFATE) one to 2 puffs  every 4 hours as needed may substitute  #1 x 6   Entered and Authorized by:   Storm Frisk MD   Signed by:   Storm Frisk MD on 12/11/2009   Method used:   Print then Give to Patient   RxID:   [id number]      Prevention & Chronic Care Immunizations   Influenza vaccine: Fluvax 3+  (12/11/2009)    Tetanus booster: Not documented    Pneumococcal vaccine: Pneumovax  (11/23/2007)    H. zoster vaccine: Not documented  Colorectal Screening   Hemoccult: Not documented    Colonoscopy: Not documented  Other Screening    Pap smear: Not documented    Mammogram: Not documented    DXA bone density scan: Not documented   Smoking status: quit > 6 months  (11/11/2009)  Lipids   Total Cholesterol: Not documented   LDL: Not documented   LDL Direct: Not documented   HDL: Not documented   Triglycerides: Not documented  Hypertension   Last Blood Pressure: 118 / 72  (12/11/2009)   Serum creatinine: Not documented   Serum potassium Not documented  Self-Management Support :    Hypertension self-management support: Not documented   Nursing Instructions: Give Flu vaccine today       Flu Vaccine Consent Questions     Do you have a history of severe allergic reactions to this vaccine? no    Any prior history of allergic reactions to egg and/or gelatin? no    Do you have a sensitivity to the preservative Thimersol? no    Do you have a past history of Guillan-Barre Syndrome? no    Do you currently have an acute febrile illness? no    Have you ever had a severe reaction to latex? no    Vaccine information given and explained to patient? yes    Are you currently pregnant? no    Lot Number:[id number]   Exp Date:08/16/2010   Site Given  Right Deltoid IMflu1 Gweneth Dimitri RN  December 11, 2009 2:37 PM  Appended Document: Pulmonary OV fax Britta Mccreedy [place]

## 2010-03-18 NOTE — Progress Notes (Signed)
Summary: rx refills  Phone Note Call from Patient Call back at Home Phone [phone]   Caller: Patient Call For: wright Summary of Call: pt requests refills of albuterol and ipratropium for neb. says she "usually " gets 4 of each at a time. ok to leave msg on pt's home phone. [facility] [address] Initial call taken by: Tivis Ringer, CNA,  October 29, 2009 12:47 PM  Follow-up for Phone Call        Spoke with pt.  She is requesting 90 day supply for neb meds.  Rx was sent to [facility] in [place].  Pt aware to keep upcoming rov with PW for 11/15/09 Follow-up by: Vernie Murders,  October 29, 2009 1:08 PM    Prescriptions: ALBUTEROL SULFATE (2.5 MG/3ML) 0.083% NEBU (ALBUTEROL SULFATE) 1 in nebulizer with ipratropium two times a day as needed  #180 x 3   Entered by:   Vernie Murders   Authorized by:   Storm Frisk MD   Signed by:   Vernie Murders on 10/29/2009   Method used:   Electronically to        [facility] [address] (retail)       [address]       [place], [place]  [address]       Ph: [phone]       Fax: [phone]   RxID:   [id number] IPRATROPIUM BROMIDE 0.02 % SOLN (IPRATROPIUM BROMIDE) 1 vial in nebulizer two times a day and as needed Brand medically necessary #180 x 3   Entered by:   Vernie Murders   Authorized by:   Storm Frisk MD   Signed by:   Vernie Murders on 10/29/2009   Method used:   Electronically to        [facility] [address] (retail)       [address]       [place], [place]  [address]       Ph: [phone]       Fax: [phone]   RxID:   [id number]

## 2010-03-18 NOTE — Assessment & Plan Note (Signed)
Summary: Pulmonary OV   Primary Provider/Referring Provider:  Dr. Leim Fabry in [place]  CC:   and 4 week follow up sob better uses nebulizer twice a day.  History of Present Illness: This is a 70 yo WF with hx of asthmatic bronchitis  February 18, 2009 9:43 AM Still has mucous in back of head, still with pn drip, notes some headaches. The pt notes the  top of head and over eyes is sore.   The pt now has one pred left. Her cough overall is better.  The dyspnea is better.  There is no chest pain.  The pt has not had a recent CT sinus.  April 05, 2009 10:56 AM Still with issues.  Now :  more pn drip.  two weeks ago more head congestion, was clear and white, did not feel bad.  Then 4 days ago, lost voice, and over past 3 days worse .  Now more cough with green mucous.  More congestion in the chest.  Notes blood out of nostrils.  May 09, 2009 3:05 PM This pt is much better.  She did get agitated with steroids.  She was coughing but now is clear. No wheeze.   No chest pain  Preventive Screening-Counseling & Management  Alcohol-Tobacco     Smoking Status: quit > 6 months  Current Medications (verified): 1)  Symbicort 160-4.5 Mcg/act Aero (Budesonide-Formoterol Fumarate) .... Inhale 2 Puffs Two Times A Day 2)  Fluticasone Propionate 50 Mcg/act Susp (Fluticasone Propionate) .... 2 Puffs Once Daily 3)  Nexium 40 Mg  Cpdr (Esomeprazole Magnesium) .... One Daily 4)  Diclofenac Sodium 75 Mg  Tbec (Diclofenac Sodium) .... One By Mouth Twice Daily 5)  Zolpidem Tartrate 10 Mg Tabs (Zolpidem Tartrate) .... At Bedtime 6)  Triamterene-Hctz 37.5-25 Mg  Caps (Triamterene-Hctz) .... One By Mouth Once Daily 7)  Proair Hfa 108 (90 Base) Mcg/act  Aers (Albuterol Sulfate) .... One To 2 Puffs  Every 4 Hours As Needed 8)  Nystatin 100000 Unit/ml  Susp (Nystatin) .... 5 Ccc Three Times A Day As Needed 9)  Tussionex Pennkinetic Er 8-10 Mg/26ml  Lqcr (Chlorpheniramine-Hydrocodone) .Marland Kitchen.. 1 Tsp Every 12  Hrs As Needed Cough 10)  Mucinex Dm Maximum Strength 60-1200 Mg Xr12h-Tab (Dextromethorphan-Guaifenesin) .Marland Kitchen.. 1 By Mouth Two Times A Day As Needed 11)  Amlodipine Besylate 5 Mg Tabs (Amlodipine Besylate) .... Once Daily 12)  Ipratropium Bromide 0.02 % Soln (Ipratropium Bromide) .Marland Kitchen.. 1 Vial in Nebulizer Two Times A Day and As Needed 13)  Albuterol Sulfate (2.5 Mg/82ml) 0.083% Nebu (Albuterol Sulfate) .Marland Kitchen.. 1 in Nebulizer With Ipratropium Two Times A Day As Needed  Allergies (verified): 1)  ! Prednisone  Past History:  Past medical, surgical, family and social histories (including risk factors) reviewed, and no changes noted (except as noted below).  Past Medical History: Reviewed history from 02/18/2009 and no changes required. BRONCHITIS, ACUTE (ICD-466.0) HYPERTENSION (ICD-401.9) GERD (ICD-530.81) COPD (ICD-496)  -FeV1 67%  DLCO 65%  2007  -CT Sinus neg 02/2009  Past Pulmonary History:  Pulmonary History: CT Sinus neg 02/2009  Family History: Reviewed history from 02/23/2007 and no changes required. MI/Heart Attack  Social History: Reviewed history from 09/05/2007 and no changes required. Patient states former smoker.  Divorced (currently engaged) 1 son Retired-prev. worked at [employer]  Review of Systems       The patient complains of shortness of breath with activity and nasal congestion/difficulty breathing through nose.  The patient denies shortness of breath at rest, productive  cough, non-productive cough, coughing up blood, chest pain, irregular heartbeats, acid heartburn, indigestion, loss of appetite, weight change, abdominal pain, difficulty swallowing, sore throat, tooth/dental problems, headaches, sneezing, itching, ear ache, anxiety, depression, hand/feet swelling, joint stiffness or pain, rash, change in color of mucus, and fever.    Vital Signs:  Patient profile:   70 year old female Height:      64 inches Weight:      171.8 pounds O2 Sat:      94 % on Room  air Temp:     98.3 degrees F oral Pulse rate:   78 / minute BP sitting:   134 / 80  (left arm)  Vitals Entered By: Renold Genta RCP, LPN (May 09, 2009 2:31 PM)  O2 Sat at Rest %:  94% O2 Flow:  Room air CC: ,4 week follow up sob better uses nebulizer twice a day Comments Medications reviewed with patient Renold Genta RCP, LPN  May 09, 2009 2:33 PM    Physical Exam  Additional Exam:  Gen. Pleasant, well-nourished, in no distress ENT - improved purulence but persistent erythema and nasal narrowing  Neck: No JVD, no thyromegaly, no carotid bruits Lungs: no use of accessory muscles, no dullness to percussion, improved airflow.   Cardiovascular: Rhythm regular, heart sounds  normal, no murmurs or gallops, no peripheral edema Musculoskeletal: No deformities, no cyanosis or clubbing      Impression & Recommendations:  Problem # 1:  COPD (ICD-496) Assessment Improved  Stable COPD  with  AB flare now improved plan No change in inhaled medications.   Maintain treatment program as currently prescribed.  Complete Medication List: 1)  Symbicort 160-4.5 Mcg/act Aero (Budesonide-formoterol fumarate) .... Inhale 2 puffs two times a day 2)  Fluticasone Propionate 50 Mcg/act Susp (Fluticasone propionate) .... 2 puffs once daily 3)  Nexium 40 Mg Cpdr (Esomeprazole magnesium) .... One daily 4)  Diclofenac Sodium 75 Mg Tbec (Diclofenac sodium) .... One by mouth twice daily 5)  Zolpidem Tartrate 10 Mg Tabs (Zolpidem tartrate) .... At bedtime 6)  Triamterene-hctz 37.5-25 Mg Caps (Triamterene-hctz) .... One by mouth once daily 7)  Proair Hfa 108 (90 Base) Mcg/act Aers (Albuterol sulfate) .... One to 2 puffs  every 4 hours as needed 8)  Nystatin 100000 Unit/ml Susp (Nystatin) .... 5 ccc three times a day as needed 9)  Tussionex Pennkinetic Er 8-10 Mg/42ml Lqcr (Chlorpheniramine-hydrocodone) .Marland Kitchen.. 1 tsp every 12 hrs as needed cough 10)  Mucinex Dm Maximum Strength 60-1200 Mg Xr12h-tab  (Dextromethorphan-guaifenesin) .Marland Kitchen.. 1 by mouth two times a day as needed 11)  Amlodipine Besylate 5 Mg Tabs (Amlodipine besylate) .... Once daily 12)  Ipratropium Bromide 0.02 % Soln (Ipratropium bromide) .Marland Kitchen.. 1 vial in nebulizer two times a day and as needed 13)  Albuterol Sulfate (2.5 Mg/56ml) 0.083% Nebu (Albuterol sulfate) .Marland Kitchen.. 1 in nebulizer with ipratropium two times a day as needed  Other Orders: Est. Patient Level III (95638)  Patient Instructions: 1)  No change in medications 2)  Return 6-8 weeks

## 2010-03-18 NOTE — Assessment & Plan Note (Signed)
Summary: Pulmonary OV   Primary Provider/Referring Provider:  Dr. Leim Fabry in [place]  CC:  2 month follow up.  Pt states she's had a "couple of spells" since last OV.  States last week she had to fill avelox rx d/t prod cough with green mucus and hoarseness.  Pt states breathing is "ok" now.  Still having "a little" wheezing and nonprod cough.  .  History of Present Illness: This is a 70 yo WF with hx of asthmatic bronchitis  August 06, 2009 11:05 AM Had to fill avelox last week for bronchitis.  Has had two flareups since 4/11.  Self Rx pred pulse  at first and got better.   Then fell and fx arm on a sunday at church and went to ED.  Then one week ago was at the [facility] and got worse with hoarseness and felt worse and then took 5 d of avelox.  Now is better.  Fx is in the humerus.  Being treated conservatively. Now:  notes sl wheeze at night,  will cough occasionally.  Ortho is Dr Kennith Center in [place].   Preventive Screening-Counseling & Management  Alcohol-Tobacco     Smoking Status: quit > 6 months  Current Medications (verified): 1)  Symbicort 160-4.5 Mcg/act Aero (Budesonide-Formoterol Fumarate) .... Inhale 2 Puffs Two Times A Day 2)  Flonase 50 Mcg/act  Susp (Fluticasone Propionate) .... Two Puffs Each Nostril Daily 3)  Nexium 40 Mg  Cpdr (Esomeprazole Magnesium) .... By Mouth Daily. Take One Half Hour Before Eating. 4)  Diclofenac Sodium 75 Mg  Tbec (Diclofenac Sodium) .... One By Mouth Twice Daily 5)  Zolpidem Tartrate 10 Mg Tabs (Zolpidem Tartrate) .... At Bedtime 6)  Triamterene-Hctz 37.5-25 Mg  Caps (Triamterene-Hctz) .... One By Mouth Once Daily 7)  Proair Hfa 108 (90 Base) Mcg/act  Aers (Albuterol Sulfate) .... One To 2 Puffs  Every 4 Hours As Needed 8)  Nystatin 100000 Unit/ml  Susp (Nystatin) .... 5 Ccc Three Times A Day As Needed 9)  Tussionex Pennkinetic Er 8-10 Mg/42ml  Lqcr (Chlorpheniramine-Hydrocodone) .Marland Kitchen.. 1 Tsp Every 12 Hrs As Needed Cough 10)  Mucinex Dm  Maximum Strength 60-1200 Mg Xr12h-Tab (Dextromethorphan-Guaifenesin) .Marland Kitchen.. 1 By Mouth Two Times A Day As Needed 11)  Amlodipine Besylate 5 Mg Tabs (Amlodipine Besylate) .... Once Daily 12)  Ipratropium Bromide 0.02 % Soln (Ipratropium Bromide) .Marland Kitchen.. 1 Vial in Nebulizer Two Times A Day and As Needed 13)  Albuterol Sulfate (2.5 Mg/72ml) 0.083% Nebu (Albuterol Sulfate) .Marland Kitchen.. 1 in Nebulizer With Ipratropium Two Times A Day As Needed 14)  Hydrocodone-Acetaminophen 5-325 Mg Tabs (Hydrocodone-Acetaminophen) .... As Needed  Allergies (verified): 1)  ! Prednisone  Past History:  Past medical, surgical, family and social histories (including risk factors) reviewed, and no changes noted (except as noted below).  Past Medical History: Reviewed history from 02/18/2009 and no changes required. BRONCHITIS, ACUTE (ICD-466.0) HYPERTENSION (ICD-401.9) GERD (ICD-530.81) COPD (ICD-496)  -FeV1 67%  DLCO 65%  2007  -CT Sinus neg 02/2009  Past Pulmonary History:  Pulmonary History: CT Sinus neg 02/2009  Family History: Reviewed history from 02/23/2007 and no changes required. MI/Heart Attack  Social History: Reviewed history from 09/05/2007 and no changes required. Patient states former smoker.  Divorced (currently engaged) 1 son Retired-prev. worked at [employer]  Review of Systems       The patient complains of shortness of breath with activity and non-productive cough.  The patient denies shortness of breath at rest, productive cough, coughing up blood, chest pain, irregular  heartbeats, acid heartburn, indigestion, loss of appetite, weight change, abdominal pain, difficulty swallowing, sore throat, tooth/dental problems, headaches, nasal congestion/difficulty breathing through nose, sneezing, itching, ear ache, anxiety, depression, hand/feet swelling, joint stiffness or pain, rash, change in color of mucus, and fever.    Vital Signs:  Patient profile:   70 year old female Height:      64  inches Weight:      165 pounds BMI:     28.42 O2 Sat:      94 % on Room air Temp:     97.9 degrees F oral Pulse rate:   72 / minute BP sitting:   120 / 76  (right arm) Cuff size:   regular  Vitals Entered By: Gweneth Dimitri RN (August 06, 2009 10:58 AM)  O2 Flow:  Room air CC: 2 month follow up.  Pt states she's had a "couple of spells" since last OV.  States last week she had to fill avelox rx d/t prod cough with green mucus and hoarseness.  Pt states breathing is "ok" now.  Still having "a little" wheezing and nonprod cough.   Comments Medications reviewed with patient Daytime contact number verified with patient. Gweneth Dimitri RN  August 06, 2009 10:58 AM    Physical Exam  Additional Exam:  Gen. Pleasant, well-nourished, in no distress ENT - improved purulence but persistent erythema and nasal narrowing  Neck: No JVD, no thyromegaly, no carotid bruits Lungs: no use of accessory muscles, no dullness to percussion, improved airflow.   Cardiovascular: Rhythm regular, heart sounds  normal, no murmurs or gallops, no peripheral edema Musculoskeletal: No deformities, no cyanosis or clubbing      Impression & Recommendations:  Problem # 1:  COPD (ICD-496) Assessment Improved  stable chronic obstructive lung disease with decrease airway inflammation reflux disease is likely playing a role. Plan Continue reflux treatment. Continue inhaled medications as prescribed  Medications Added to Medication List This Visit: 1)  Hydrocodone-acetaminophen 5-325 Mg Tabs (Hydrocodone-acetaminophen) .... As needed  Complete Medication List: 1)  Symbicort 160-4.5 Mcg/act Aero (Budesonide-formoterol fumarate) .... Inhale 2 puffs two times a day 2)  Flonase 50 Mcg/act Susp (Fluticasone propionate) .... Two puffs each nostril daily 3)  Nexium 40 Mg Cpdr (Esomeprazole magnesium) .... By mouth daily. take one half hour before eating. 4)  Diclofenac Sodium 75 Mg Tbec (Diclofenac sodium) .... One by mouth  twice daily 5)  Zolpidem Tartrate 10 Mg Tabs (Zolpidem tartrate) .... At bedtime 6)  Triamterene-hctz 37.5-25 Mg Caps (Triamterene-hctz) .... One by mouth once daily 7)  Proair Hfa 108 (90 Base) Mcg/act Aers (Albuterol sulfate) .... One to 2 puffs  every 4 hours as needed 8)  Nystatin 100000 Unit/ml Susp (Nystatin) .... 5 ccc three times a day as needed 9)  Tussionex Pennkinetic Er 8-10 Mg/23ml Lqcr (Chlorpheniramine-hydrocodone) .Marland Kitchen.. 1 tsp every 12 hrs as needed cough 10)  Mucinex Dm Maximum Strength 60-1200 Mg Xr12h-tab (Dextromethorphan-guaifenesin) .Marland Kitchen.. 1 by mouth two times a day as needed 11)  Amlodipine Besylate 5 Mg Tabs (Amlodipine besylate) .... Once daily 12)  Ipratropium Bromide 0.02 % Soln (Ipratropium bromide) .Marland Kitchen.. 1 vial in nebulizer two times a day and as needed 13)  Albuterol Sulfate (2.5 Mg/63ml) 0.083% Nebu (Albuterol sulfate) .Marland Kitchen.. 1 in nebulizer with ipratropium two times a day as needed 14)  Hydrocodone-acetaminophen 5-325 Mg Tabs (Hydrocodone-acetaminophen) .... As needed  Other Orders: Est. Patient Level III (16109)  Patient Instructions: 1)  No change in medications 2)  Return in  3       months    Appended Document: Pulmonary OV fax Leim Fabry  [place]

## 2010-03-18 NOTE — Miscellaneous (Signed)
Summary: CT Scan Sinus    Clinical Lists Changes  Observations: Added new observation of CT OF SINUS: IMPRESSION: Retention cyst or polyp in the left maxillary sinus. (02/18/2009 17:19)      CT of Sinus  Procedure date:  02/18/2009  Findings:      IMPRESSION: Retention cyst or polyp in the left maxillary sinus.

## 2010-03-18 NOTE — Assessment & Plan Note (Signed)
Summary: Pulmonary OV   Primary Provider/Referring Provider:  Dr. Leim Fabry in [place]  CC:  Acute Visit.  c/o drainage and chest tightness and increased SOB with activity since Saturday.  Marland Kitchen  History of Present Illness: This is a 70 yo WF with hx of asthmatic bronchitis  February 18, 2009 9:43 AM Still has mucous in back of head, still with pn drip, notes some headaches. The pt notes the  top of head and over eyes is sore.   The pt now has one pred left. Her cough overall is better.  The dyspnea is better.  There is no chest pain.  The pt has not had a recent CT sinus.  April 05, 2009 10:56 AM Still with issues.  Now :  more pn drip.  two weeks ago more head congestion, was clear and white, did not feel bad.  Then 4 days ago, lost voice, and over past 3 days worse .  Now more cough with green mucous.  More congestion in the chest.  Notes blood out of nostrils.  May 09, 2009 3:05 PM This pt is much better.  She did get agitated with steroids.  She was coughing but now is clear. No wheeze.   No chest pain  May 20, 2009 3:47 PM Noting over the past weekend more pndrip and sore throat and chest tight and throat burned.  No real cough.  Notes chest tightness.  Pt is more dyspneic with exertion.    Preventive Screening-Counseling & Management  Alcohol-Tobacco     Smoking Status: quit > 6 months  Current Medications (verified): 1)  Symbicort 160-4.5 Mcg/act Aero (Budesonide-Formoterol Fumarate) .... Inhale 2 Puffs Two Times A Day 2)  Fluticasone Propionate 50 Mcg/act Susp (Fluticasone Propionate) .... 2 Puffs Once Daily 3)  Nexium 40 Mg  Cpdr (Esomeprazole Magnesium) .... One Daily 4)  Diclofenac Sodium 75 Mg  Tbec (Diclofenac Sodium) .... One By Mouth Twice Daily 5)  Zolpidem Tartrate 10 Mg Tabs (Zolpidem Tartrate) .... At Bedtime 6)  Triamterene-Hctz 37.5-25 Mg  Caps (Triamterene-Hctz) .... One By Mouth Once Daily 7)  Proair Hfa 108 (90 Base) Mcg/act  Aers (Albuterol  Sulfate) .... One To 2 Puffs  Every 4 Hours As Needed 8)  Nystatin 100000 Unit/ml  Susp (Nystatin) .... 5 Ccc Three Times A Day As Needed 9)  Tussionex Pennkinetic Er 8-10 Mg/66ml  Lqcr (Chlorpheniramine-Hydrocodone) .Marland Kitchen.. 1 Tsp Every 12 Hrs As Needed Cough 10)  Mucinex Dm Maximum Strength 60-1200 Mg Xr12h-Tab (Dextromethorphan-Guaifenesin) .Marland Kitchen.. 1 By Mouth Two Times A Day As Needed 11)  Amlodipine Besylate 5 Mg Tabs (Amlodipine Besylate) .... Once Daily 12)  Ipratropium Bromide 0.02 % Soln (Ipratropium Bromide) .Marland Kitchen.. 1 Vial in Nebulizer Two Times A Day and As Needed 13)  Albuterol Sulfate (2.5 Mg/33ml) 0.083% Nebu (Albuterol Sulfate) .Marland Kitchen.. 1 in Nebulizer With Ipratropium Two Times A Day As Needed  Allergies (verified): 1)  ! Prednisone  Past History:  Past medical, surgical, family and social histories (including risk factors) reviewed, and no changes noted (except as noted below).  Past Medical History: Reviewed history from 02/18/2009 and no changes required. BRONCHITIS, ACUTE (ICD-466.0) HYPERTENSION (ICD-401.9) GERD (ICD-530.81) COPD (ICD-496)  -FeV1 67%  DLCO 65%  2007  -CT Sinus neg 02/2009  Past Pulmonary History:  Pulmonary History: CT Sinus neg 02/2009  Family History: Reviewed history from 02/23/2007 and no changes required. MI/Heart Attack  Social History: Reviewed history from 09/05/2007 and no changes required. Patient states former smoker.  Divorced (currently engaged) 1 son Retired-prev. worked at [employer]  Review of Systems       The patient complains of shortness of breath with activity, productive cough, non-productive cough, difficulty swallowing, sore throat, and nasal congestion/difficulty breathing through nose.  The patient denies shortness of breath at rest, coughing up blood, chest pain, irregular heartbeats, acid heartburn, indigestion, loss of appetite, weight change, abdominal pain, tooth/dental problems, headaches, sneezing, itching, ear ache, anxiety,  depression, hand/feet swelling, joint stiffness or pain, rash, change in color of mucus, and fever.    Vital Signs:  Patient profile:   70 year old female Height:      64 inches Weight:      171.50 pounds BMI:     29.54 O2 Sat:      95 % on Room air Temp:     98.5 degrees F oral Pulse rate:   80 / minute BP sitting:   152 / 80  (left arm) Cuff size:   regular  Vitals Entered By: Gweneth Dimitri RN (May 20, 2009 3:33 PM)  O2 Flow:  Room air CC: Acute Visit.  c/o drainage, chest tightness and increased SOB with activity since Saturday.   Comments Medications reviewed with patient Daytime contact number verified with patient. Gweneth Dimitri RN  May 20, 2009 3:33 PM    Physical Exam  Additional Exam:  Gen. Pleasant, well-nourished, in no distress ENT - improved purulence but persistent erythema and nasal narrowing  Neck: No JVD, no thyromegaly, no carotid bruits Lungs: no use of accessory muscles, no dullness to percussion, improved airflow.   Cardiovascular: Rhythm regular, heart sounds  normal, no murmurs or gallops, no peripheral edema Musculoskeletal: No deformities, no cyanosis or clubbing      Impression & Recommendations:  Problem # 1:  GERD (ICD-530.81) Assessment Deteriorated Suspect sore throat and increase cough is due to reflux flare,  no evident acute infection plan change nexium to dexilant one daily reflux diet  Her updated medication list for this problem includes:    Dexilant 60 Mg Cpdr (Dexlansoprazole) ..... One by mouth daily  Orders: Est. Patient Level IV (16109)  Problem # 2:  COPD (ICD-496) Assessment: Unchanged stable copd with allergic rhinitis plan change flonase to veramyst two sprays each nostril daily no systemic steroids or abx needed  Medications Added to Medication List This Visit: 1)  Veramyst 27.5 Mcg/spray Susp (Fluticasone furoate) .... Two puffs each nostril daily 2)  Dexilant 60 Mg Cpdr (Dexlansoprazole) .... One by mouth  daily  Complete Medication List: 1)  Symbicort 160-4.5 Mcg/act Aero (Budesonide-formoterol fumarate) .... Inhale 2 puffs two times a day 2)  Veramyst 27.5 Mcg/spray Susp (Fluticasone furoate) .... Two puffs each nostril daily 3)  Dexilant 60 Mg Cpdr (Dexlansoprazole) .... One by mouth daily 4)  Diclofenac Sodium 75 Mg Tbec (Diclofenac sodium) .... One by mouth twice daily 5)  Zolpidem Tartrate 10 Mg Tabs (Zolpidem tartrate) .... At bedtime 6)  Triamterene-hctz 37.5-25 Mg Caps (Triamterene-hctz) .... One by mouth once daily 7)  Proair Hfa 108 (90 Base) Mcg/act Aers (Albuterol sulfate) .... One to 2 puffs  every 4 hours as needed 8)  Nystatin 100000 Unit/ml Susp (Nystatin) .... 5 ccc three times a day as needed 9)  Tussionex Pennkinetic Er 8-10 Mg/67ml Lqcr (Chlorpheniramine-hydrocodone) .Marland Kitchen.. 1 tsp every 12 hrs as needed cough 10)  Mucinex Dm Maximum Strength 60-1200 Mg Xr12h-tab (Dextromethorphan-guaifenesin) .Marland Kitchen.. 1 by mouth two times a day as needed 11)  Amlodipine Besylate 5  Mg Tabs (Amlodipine besylate) .... Once daily 12)  Ipratropium Bromide 0.02 % Soln (Ipratropium bromide) .Marland Kitchen.. 1 vial in nebulizer two times a day and as needed 13)  Albuterol Sulfate (2.5 Mg/14ml) 0.083% Nebu (Albuterol sulfate) .Marland Kitchen.. 1 in nebulizer with ipratropium two times a day as needed  Patient Instructions: 1)  Change flonase to veramyst two sprays each nostril daily 2)  Stop Nexium 3)  Use Dexilant one daily 4)  Follow a strict Reflux Diet 5)  No other medication changes 6)  Return two weeks

## 2010-03-18 NOTE — Assessment & Plan Note (Signed)
Summary: Pulmonary OV   Primary Provider/Referring Provider:  Dr. Leim Fabry in [place]  CC:  Acute visit.  Pt c/o wheezing at night x 2 days.  She states that she has also had some nasal congestion and laryngitis over the past 2 wks.  She has been coughing for 2 days- prod with green sputum.  Also has noticed frequent throat clearing.Kristin Estrada  History of Present Illness: This is a 70 yo WF with hx of asthmatic bronchitis  February 18, 2009 9:43 AM Still has mucous in back of head, still with pn drip, notes some headaches. The pt notes the  top of head and over eyes is sore.   The pt now has one pred left. Her cough overall is better.  The dyspnea is better.  There is no chest pain.  The pt has not had a recent CT sinus.  April 05, 2009 10:56 AM Still with issues.  Now :  more pn drip.  two weeks ago more head congestion, was clear and white, did not feel bad.  Then 4 days ago, lost voice, and over past 3 days worse .  Now more cough with green mucous.  More congestion in the chest.  Notes blood out of nostrils.      Preventive Screening-Counseling & Management  Alcohol-Tobacco     Smoking Status: quit > 6 months  Current Medications (verified): 1)  Symbicort 160-4.5 Mcg/act Aero (Budesonide-Formoterol Fumarate) .... Inhale 2 Puffs Two Times A Day 2)  Fluticasone Propionate 50 Mcg/act Susp (Fluticasone Propionate) .... 2 Puffs Once Daily 3)  Nexium 40 Mg  Cpdr (Esomeprazole Magnesium) .... One Daily 4)  Diclofenac Sodium 75 Mg  Tbec (Diclofenac Sodium) .... One By Mouth Twice Daily 5)  Zolpidem Tartrate 10 Mg Tabs (Zolpidem Tartrate) .... At Bedtime 6)  Triamterene-Hctz 37.5-25 Mg  Caps (Triamterene-Hctz) .... One By Mouth Once Daily 7)  Proair Hfa 108 (90 Base) Mcg/act  Aers (Albuterol Sulfate) .... One To 2 Puffs  Every 4 Hours As Needed 8)  Nystatin 100000 Unit/ml  Susp (Nystatin) .... 5 Ccc Three Times A Day As Needed 9)  Tussionex Pennkinetic Er 8-10 Mg/5ml  Lqcr  (Chlorpheniramine-Hydrocodone) .Kristin Estrada.. 1 Tsp Every 12 Hrs As Needed Cough 10)  Mucinex Dm Maximum Strength 60-1200 Mg Xr12h-Tab (Dextromethorphan-Guaifenesin) .Kristin Estrada.. 1 By Mouth Two Times A Day As Needed 11)  Amlodipine Besylate 5 Mg Tabs (Amlodipine Besylate) .... Once Daily 12)  Ipratropium Bromide 0.02 % Soln (Ipratropium Bromide) .Kristin Estrada.. 1 Vial in Nebulizer Two Times A Day and As Needed 13)  Albuterol Sulfate (2.5 Mg/84ml) 0.083% Nebu (Albuterol Sulfate) .Kristin Estrada.. 1 in Nebulizer With Ipratropium Two Times A Day As Needed  Allergies (verified): No Known Drug Allergies  Past History:  Past medical, surgical, family and social histories (including risk factors) reviewed, and no changes noted (except as noted below).  Past Medical History: Reviewed history from 02/18/2009 and no changes required. BRONCHITIS, ACUTE (ICD-466.0) HYPERTENSION (ICD-401.9) GERD (ICD-530.81) COPD (ICD-496)  -FeV1 67%  DLCO 65%  2007  -CT Sinus neg 02/2009  Past Pulmonary History:  Pulmonary History: CT Sinus neg 02/2009  Family History: Reviewed history from 02/23/2007 and no changes required. MI/Heart Attack  Social History: Reviewed history from 09/05/2007 and no changes required. Patient states former smoker.  Divorced (currently engaged) 1 son Retired-prev. worked at [employer]  Review of Systems       The patient complains of shortness of breath with activity, productive cough, non-productive cough, chest pain, acid heartburn, indigestion, headaches,  nasal congestion/difficulty breathing through nose, and change in color of mucus.  The patient denies shortness of breath at rest, coughing up blood, irregular heartbeats, loss of appetite, weight change, abdominal pain, difficulty swallowing, sore throat, tooth/dental problems, sneezing, itching, ear ache, anxiety, depression, hand/feet swelling, joint stiffness or pain, rash, and fever.    Vital Signs:  Patient profile:   70 year old female Weight:      174  pounds O2 Sat:      93 % on Room air Temp:     97.5 degrees F oral Pulse rate:   89 / minute BP sitting:   134 / 82  (left arm)  Vitals Entered By: Vernie Murders (April 05, 2009 10:41 AM)  O2 Flow:  Room air  Physical Exam  Additional Exam:  Gen. Pleasant, well-nourished, in no distress ENT - improved purulence but persistent erythema and nasal narrowing  Neck: No JVD, no thyromegaly, no carotid bruits Lungs: no use of accessory muscles, no dullness to percussion, expired wheeze Cardiovascular: Rhythm regular, heart sounds  normal, no murmurs or gallops, no peripheral edema Musculoskeletal: No deformities, no cyanosis or clubbing      Impression & Recommendations:  Problem # 1:  OTHER ACUTE SINUSITIS (ICD-461.8) Assessment Deteriorated acute sinusitis and bronchitis flare  plan Prednisone 10mg  4 each am x3days, 3 x 3days, 2 x 3days, 1 x 3days then stop Avelox one daily x 5 days Reduce flonase to two sprays daily Stay on mucinex No change in inhalers Use saline nasal spray three sprays each nostril three times daily Return 4 weeks Her updated medication list for this problem includes:    Fluticasone Propionate 50 Mcg/act Susp (Fluticasone propionate) .Kristin Estrada... 2 puffs once daily    Tussionex Pennkinetic Er 8-10 Mg/48ml Lqcr (Chlorpheniramine-hydrocodone) .Kristin Estrada... 1 tsp every 12 hrs as needed cough    Mucinex Dm Maximum Strength 60-1200 Mg Xr12h-tab (Dextromethorphan-guaifenesin) .Kristin Estrada... 1 by mouth two times a day as needed    Avelox 400 Mg Tabs (Moxifloxacin hcl) ..... By mouth daily  Orders: Est. Patient Level IV ([address])  Medications Added to Medication List This Visit: 1)  Albuterol Sulfate (2.5 Mg/63ml) 0.083% Nebu (Albuterol sulfate) .Kristin Estrada.. 1 in nebulizer with ipratropium two times a day as needed 2)  Avelox 400 Mg Tabs (Moxifloxacin hcl) .... By mouth daily 3)  Prednisone 10 Mg Tabs (Prednisone) .... Take as directed 4 each am x3days, 3 x 3days, 2 x 3days, 1 x 3days then  stop  Complete Medication List: 1)  Symbicort 160-4.5 Mcg/act Aero (Budesonide-formoterol fumarate) .... Inhale 2 puffs two times a day 2)  Fluticasone Propionate 50 Mcg/act Susp (Fluticasone propionate) .... 2 puffs once daily 3)  Nexium 40 Mg Cpdr (Esomeprazole magnesium) .... One daily 4)  Diclofenac Sodium 75 Mg Tbec (Diclofenac sodium) .... One by mouth twice daily 5)  Zolpidem Tartrate 10 Mg Tabs (Zolpidem tartrate) .... At bedtime 6)  Triamterene-hctz 37.5-25 Mg Caps (Triamterene-hctz) .... One by mouth once daily 7)  Proair Hfa 108 (90 Base) Mcg/act Aers (Albuterol sulfate) .... One to 2 puffs  every 4 hours as needed 8)  Nystatin 100000 Unit/ml Susp (Nystatin) .... 5 ccc three times a day as needed 9)  Tussionex Pennkinetic Er 8-10 Mg/58ml Lqcr (Chlorpheniramine-hydrocodone) .Kristin Estrada.. 1 tsp every 12 hrs as needed cough 10)  Mucinex Dm Maximum Strength 60-1200 Mg Xr12h-tab (Dextromethorphan-guaifenesin) .Kristin Estrada.. 1 by mouth two times a day as needed 11)  Amlodipine Besylate 5 Mg Tabs (Amlodipine besylate) .... Once daily 12)  Ipratropium  Bromide 0.02 % Soln (Ipratropium bromide) .Kristin Estrada.. 1 vial in nebulizer two times a day and as needed 13)  Albuterol Sulfate (2.5 Mg/75ml) 0.083% Nebu (Albuterol sulfate) .Kristin Estrada.. 1 in nebulizer with ipratropium two times a day as needed 14)  Avelox 400 Mg Tabs (Moxifloxacin hcl) .... By mouth daily 15)  Prednisone 10 Mg Tabs (Prednisone) .... Take as directed 4 each am x3days, 3 x 3days, 2 x 3days, 1 x 3days then stop  Patient Instructions: 1)  Prednisone 10mg  4 each am x3days, 3 x 3days, 2 x 3days, 1 x 3days then stop 2)  Avelox one daily x 5 days 3)  Reduce flonase to two sprays daily 4)  Stay on mucinex 5)  No change in inhalers 6)  Use saline nasal spray three sprays each nostril three times daily 7)  Return 4 weeks Prescriptions: PREDNISONE 10 MG  TABS (PREDNISONE) Take as directed 4 each am x3days, 3 x 3days, 2 x 3days, 1 x 3days then stop  #30 x 0   Entered  and Authorized by:   Storm Frisk MD   Signed by:   Storm Frisk MD on 04/05/2009   Method used:   Electronically to        [facility] (retail)       [address]       [place], [place]  [id number]       Ph: [phone]       Fax: [phone]   RxID:   [id number] AVELOX 400 MG  TABS (MOXIFLOXACIN HCL) By mouth daily  #5 x 0   Entered and Authorized by:   Storm Frisk MD   Signed by:   Storm Frisk MD on 04/05/2009   Method used:   Electronically to        [facility] (retail)       [address]       [place], [place]  [id number]       Ph: [phone]       Fax: [phone]   RxID:   [id number]

## 2010-03-20 NOTE — Assessment & Plan Note (Signed)
Summary: Pulmonary OV   Primary Provider/Referring Provider:  Dr. Leim Fabry in [place]  CC:  3 month follow up.  Pt states overall breathing is unchanged.  Slight SOB at night, wheezing at times, and occas nonprod cough.  .  History of Present Illness: This is a 70 yo WF with hx of asthmatic bronchitis   March 07, 2010 3:15 PM No real cough.  Now not much dyspnea except at night.  Notes sl amount of cough, mucinex helps Pt denies any significant sore throat, nasal congestion or excess secretions, fever, chills, sweats, unintended weight loss, pleurtic or exertional chest pain, orthopnea PND, or leg swelling Pt denies any increase in rescue therapy over baseline, denies waking up needing it or having any early am or nocturnal exacerbations of coughing/wheezing/or dyspnea.   Current Medications (verified): 1)  Albuterol Sulfate (2.5 Mg/59ml) 0.083% Nebu (Albuterol Sulfate) .Marland Kitchen.. 1 in Nebulizer With Ipratropium Two Times A Day and  As Needed 2)  Ipratropium Bromide 0.02 % Soln (Ipratropium Bromide) .Marland Kitchen.. 1 Vial in Nebulizer Two Times A Day and As Needed 3)  Amlodipine Besylate 5 Mg Tabs (Amlodipine Besylate) .... Once Daily 4)  Symbicort 160-4.5 Mcg/act Aero (Budesonide-Formoterol Fumarate) .... Inhale 2 Puffs Two Times A Day 5)  Flonase 50 Mcg/act  Susp (Fluticasone Propionate) .... Two Puffs Each Nostril Daily 6)  Nexium 40 Mg  Cpdr (Esomeprazole Magnesium) .... By Mouth Daily. Take One Half Hour Before Eating. 7)  Diclofenac Sodium 75 Mg  Tbec (Diclofenac Sodium) .... One By Mouth Twice Daily 8)  Zolpidem Tartrate 10 Mg Tabs (Zolpidem Tartrate) .... At Bedtime 9)  Triamterene-Hctz 37.5-25 Mg  Caps (Triamterene-Hctz) .... One By Mouth Once Daily 10)  Tussionex Pennkinetic Er 8-10 Mg/30ml  Lqcr (Chlorpheniramine-Hydrocodone) .Marland Kitchen.. 1 Tsp Every 12 Hrs As Needed Cough 11)  Mucinex Dm Maximum Strength 60-1200 Mg Xr12h-Tab (Dextromethorphan-Guaifenesin) .Marland Kitchen.. 1 By Mouth Two Times A Day As  Needed 12)  Proair Hfa 108 (90 Base) Mcg/act  Aers (Albuterol Sulfate) .... One To 2 Puffs  Every 4 Hours As Needed May Substitute 13)  Nystatin 100000 Unit/ml  Susp (Nystatin) .... 5 Ccc Three Times A Day As Needed 14)  Tramadol Hcl 50 Mg Tabs (Tramadol Hcl) .... As Needed Pain  Allergies (verified): 1)  ! Prednisone  Past History:  Past medical, surgical, family and social histories (including risk factors) reviewed, and no changes noted (except as noted below).  Past Medical History: Reviewed history from 02/18/2009 and no changes required. BRONCHITIS, ACUTE (ICD-466.0) HYPERTENSION (ICD-401.9) GERD (ICD-530.81) COPD (ICD-496)  -FeV1 67%  DLCO 65%  2007  -CT Sinus neg 02/2009  Past Pulmonary History:  Pulmonary History: CT Sinus neg 02/2009  Family History: Reviewed history from 02/23/2007 and no changes required. MI/Heart Attack  Social History: Reviewed history from 11/11/2009 and no changes required. Patient states former smoker.  Quit in 2003.  2 ppd x 30 yrs.   Divorced (currently engaged) 1 son Retired-prev. worked at [employer]  Review of Systems       The patient complains of shortness of breath with activity.  The patient denies shortness of breath at rest, productive cough, non-productive cough, coughing up blood, chest pain, irregular heartbeats, acid heartburn, indigestion, loss of appetite, weight change, abdominal pain, difficulty swallowing, sore throat, tooth/dental problems, headaches, nasal congestion/difficulty breathing through nose, sneezing, itching, ear ache, anxiety, depression, hand/feet swelling, joint stiffness or pain, rash, change in color of mucus, and fever.    Vital Signs:  Patient profile:  70 year old female Height:      64 inches Weight:      169 pounds BMI:     29.11 O2 Sat:      94 % on Room air Temp:     98.0 degrees F oral Pulse rate:   75 / minute BP sitting:   138 / 76  (left arm) Cuff size:   regular  Vitals Entered By:  Gweneth Dimitri RN (March 07, 2010 2:59 PM)  Nutrition Counseling: Patient's BMI is greater than 25 and therefore counseled on weight management options.  O2 Flow:  Room air CC: 3 month follow up.  Pt states overall breathing is unchanged.  Slight SOB at night, wheezing at times, occas nonprod cough.   Comments Medications reviewed with patient Daytime contact number verified with patient. Gweneth Dimitri RN  March 07, 2010 3:00 PM    Physical Exam  Additional Exam:  Gen. Pleasant, well-nourished, in no distress ENT - improved purulence but persistent erythema and nasal narrowing  Neck: No JVD, no thyromegaly, no carotid bruits Lungs: no use of accessory muscles, no dullness to percussion, no wheeze Cardiovascular: Rhythm regular, heart sounds  normal, no murmurs or gallops, no peripheral edema Musculoskeletal: No deformities, no cyanosis or clubbing      Impression & Recommendations:  Problem # 1:  COPD (ICD-496) Assessment Improved  stable ab/copd plan No change in inhaled medications.   Maintain treatment program as currently prescribed.  Medications Added to Medication List This Visit: 1)  Tramadol Hcl 50 Mg Tabs (Tramadol hcl) .... As needed pain  Complete Medication List: 1)  Albuterol Sulfate (2.5 Mg/46ml) 0.083% Nebu (Albuterol sulfate) .Marland Kitchen.. 1 in nebulizer with ipratropium two times a day and  as needed 2)  Ipratropium Bromide 0.02 % Soln (Ipratropium bromide) .Marland Kitchen.. 1 vial in nebulizer two times a day and as needed 3)  Amlodipine Besylate 5 Mg Tabs (Amlodipine besylate) .... Once daily 4)  Symbicort 160-4.5 Mcg/act Aero (Budesonide-formoterol fumarate) .... Inhale 2 puffs two times a day 5)  Flonase 50 Mcg/act Susp (Fluticasone propionate) .... Two puffs each nostril daily 6)  Nexium 40 Mg Cpdr (Esomeprazole magnesium) .... By mouth daily. take one half hour before eating. 7)  Diclofenac Sodium 75 Mg Tbec (Diclofenac sodium) .... One by mouth twice daily 8)  Zolpidem  Tartrate 10 Mg Tabs (Zolpidem tartrate) .... At bedtime 9)  Triamterene-hctz 37.5-25 Mg Caps (Triamterene-hctz) .... One by mouth once daily 10)  Tussionex Pennkinetic Er 8-10 Mg/77ml Lqcr (Chlorpheniramine-hydrocodone) .Marland Kitchen.. 1 tsp every 12 hrs as needed cough 11)  Mucinex Dm Maximum Strength 60-1200 Mg Xr12h-tab (Dextromethorphan-guaifenesin) .Marland Kitchen.. 1 by mouth two times a day as needed 12)  Proair Hfa 108 (90 Base) Mcg/act Aers (Albuterol sulfate) .... One to 2 puffs  every 4 hours as needed may substitute 13)  Nystatin 100000 Unit/ml Susp (Nystatin) .... 5 ccc three times a day as needed 14)  Tramadol Hcl 50 Mg Tabs (Tramadol hcl) .... As needed pain  Other Orders: Est. Patient Level III (45409)  Patient Instructions: 1)  No change in medications 2)  Return in   4       months    Prevention & Chronic Care Immunizations   Influenza vaccine: Fluvax 3+  (12/11/2009)    Tetanus booster: Not documented    Pneumococcal vaccine: Pneumovax  (11/23/2007)    H. zoster vaccine: Not documented  Colorectal Screening   Hemoccult: Not documented    Colonoscopy: Not documented  Other Screening  Pap smear: Not documented    Mammogram: Not documented    DXA bone density scan: Not documented   Smoking status: quit > 6 months  (11/11/2009)  Lipids   Total Cholesterol: Not documented   LDL: Not documented   LDL Direct: Not documented   HDL: Not documented   Triglycerides: Not documented  Hypertension   Last Blood Pressure: 138 / 76  (03/07/2010)   Serum creatinine: Not documented   Serum potassium Not documented  Self-Management Support :    Hypertension self-management support: Not documented

## 2010-04-08 ENCOUNTER — Ambulatory Visit (INDEPENDENT_AMBULATORY_CARE_PROVIDER_SITE_OTHER): Payer: Medicare Other | Admitting: Adult Health

## 2010-04-08 ENCOUNTER — Encounter: Payer: Self-pay | Admitting: Adult Health

## 2010-04-08 DIAGNOSIS — J449 Chronic obstructive pulmonary disease, unspecified: Secondary | ICD-10-CM

## 2010-04-15 NOTE — Assessment & Plan Note (Signed)
Summary: cough//jd   Primary Provider/Referring Provider:  Dr. Leim Fabry in [place]  CC:  Acute Visit.  PND, chest congestion, prod cough with thick white to yellow mucus, and wheezing x 1 wk.  Taking mucinex with some relief..  History of Present Illness: This is a 70 yo WF with hx of asthmatic bronchitis   March 07, 2010 3:15 PM No real cough.  Now not much dyspnea except at night.  Notes sl amount of cough, mucinex helps>>no changes made   April 08, 2010 -Presents for an acute office visit.  Complains of post nasal drainage ,  chest congestion, prod cough with thick white to yellow mucus, wheezing x 1 wk.  Taking mucinex with some relief. Denies chest pain,  orthopnea, hemoptysis, fever, n/v/d, edema, headache,recent travel .   Medications Prior to Update: 1)  Albuterol Sulfate (2.5 Mg/38ml) 0.083% Nebu (Albuterol Sulfate) .Marland Kitchen.. 1 in Nebulizer With Ipratropium Two Times A Day and  As Needed 2)  Ipratropium Bromide 0.02 % Soln (Ipratropium Bromide) .Marland Kitchen.. 1 Vial in Nebulizer Two Times A Day and As Needed 3)  Amlodipine Besylate 5 Mg Tabs (Amlodipine Besylate) .... Once Daily 4)  Symbicort 160-4.5 Mcg/act Aero (Budesonide-Formoterol Fumarate) .... Inhale 2 Puffs Two Times A Day 5)  Flonase 50 Mcg/act  Susp (Fluticasone Propionate) .... Two Puffs Each Nostril Daily 6)  Nexium 40 Mg  Cpdr (Esomeprazole Magnesium) .... By Mouth Daily. Take One Half Hour Before Eating. 7)  Diclofenac Sodium 75 Mg  Tbec (Diclofenac Sodium) .... One By Mouth Twice Daily 8)  Zolpidem Tartrate 10 Mg Tabs (Zolpidem Tartrate) .... At Bedtime 9)  Triamterene-Hctz 37.5-25 Mg  Caps (Triamterene-Hctz) .... One By Mouth Once Daily 10)  Tussionex Pennkinetic Er 8-10 Mg/25ml  Lqcr (Chlorpheniramine-Hydrocodone) .Marland Kitchen.. 1 Tsp Every 12 Hrs As Needed Cough 11)  Mucinex Dm Maximum Strength 60-1200 Mg Xr12h-Tab (Dextromethorphan-Guaifenesin) .Marland Kitchen.. 1 By Mouth Two Times A Day As Needed 12)  Proair Hfa 108 (90 Base)  Mcg/act  Aers (Albuterol Sulfate) .... One To 2 Puffs  Every 4 Hours As Needed May Substitute 13)  Nystatin 100000 Unit/ml  Susp (Nystatin) .... 5 Ccc Three Times A Day As Needed 14)  Tramadol Hcl 50 Mg Tabs (Tramadol Hcl) .... As Needed Pain  Current Medications (verified): 1)  Albuterol Sulfate (2.5 Mg/83ml) 0.083% Nebu (Albuterol Sulfate) .Marland Kitchen.. 1 in Nebulizer With Ipratropium Two Times A Day and  As Needed 2)  Ipratropium Bromide 0.02 % Soln (Ipratropium Bromide) .Marland Kitchen.. 1 Vial in Nebulizer Two Times A Day and As Needed 3)  Amlodipine Besylate 5 Mg Tabs (Amlodipine Besylate) .... Once Daily 4)  Symbicort 160-4.5 Mcg/act Aero (Budesonide-Formoterol Fumarate) .... Inhale 2 Puffs Two Times A Day 5)  Flonase 50 Mcg/act  Susp (Fluticasone Propionate) .... Two Puffs Each Nostril Daily 6)  Nexium 40 Mg  Cpdr (Esomeprazole Magnesium) .... By Mouth Daily. Take One Half Hour Before Eating. 7)  Diclofenac Sodium 75 Mg  Tbec (Diclofenac Sodium) .... One By Mouth Twice Daily 8)  Zolpidem Tartrate 10 Mg Tabs (Zolpidem Tartrate) .... At Bedtime 9)  Triamterene-Hctz 37.5-25 Mg  Caps (Triamterene-Hctz) .... One By Mouth Once Daily 10)  Tussionex Pennkinetic Er 8-10 Mg/40ml  Lqcr (Chlorpheniramine-Hydrocodone) .Marland Kitchen.. 1 Tsp Every 12 Hrs As Needed Cough 11)  Mucinex Dm Maximum Strength 60-1200 Mg Xr12h-Tab (Dextromethorphan-Guaifenesin) .Marland Kitchen.. 1 By Mouth Two Times A Day As Needed 12)  Proair Hfa 108 (90 Base) Mcg/act  Aers (Albuterol Sulfate) .... One To 2 Puffs  Every 4 Hours As  Needed May Substitute 13)  Nystatin 100000 Unit/ml  Susp (Nystatin) .... 5 Ccc Three Times A Day As Needed 14)  Tramadol Hcl 50 Mg Tabs (Tramadol Hcl) .... As Needed Pain  Allergies (verified): 1)  ! Prednisone  Past History:  Past Medical History: Last updated: 02/18/2009 BRONCHITIS, ACUTE (ICD-466.0) HYPERTENSION (ICD-401.9) GERD (ICD-530.81) COPD (ICD-496)  -FeV1 67%  DLCO 65%  2007  -CT Sinus neg 02/2009  Family History: Last  updated: 02/23/2007 MI/Heart Attack  Social History: Last updated: 11/11/2009 Patient states former smoker.  Quit in 2003.  2 ppd x 30 yrs.   Divorced (currently engaged) 1 son Retired-prev. worked at [employer]  Risk Factors: Smoking Status: quit > 6 months (11/11/2009)  Past Pulmonary History:  Pulmonary History: CT Sinus neg 02/2009  Review of Systems      See HPI  Vital Signs:  Patient profile:   70 year old female Height:      64 inches Weight:      173.25 pounds BMI:     29.85 O2 Sat:      94 % on Room air Temp:     97.6 degrees F oral Pulse rate:   74 / minute BP sitting:   94 / 66  (left arm) Cuff size:   regular  Vitals Entered By: Gweneth Dimitri RN (April 08, 2010 9:17 AM)  O2 Flow:  Room air CC: Acute Visit.  PND, chest congestion, prod cough with thick white to yellow mucus, wheezing x 1 wk.  Taking mucinex with some relief. Is Patient Diabetic? No Comments Medications reviewed with patient Daytime contact number verified with patient. Gweneth Dimitri RN  April 08, 2010 9:17 AM    Physical Exam  Additional Exam:  Gen. Pleasant, well-nourished, in no distress ENT -nasal drainage-clear, non tender sinus  Neck: No JVD, no thyromegaly, no carotid bruits Lungs: coarse BS w/no wheezing Cardiovascular: Rhythm regular, heart sounds  normal, no murmurs or gallops, no peripheral edema Musculoskeletal: No deformities, no cyanosis or clubbing      Impression & Recommendations:  Problem # 1:  COPD (ICD-496) Mild flare with URI  Plan :  Augmentin 875mg  two times a day for 7 days w/food  Mucinex DM two times a day as needed cough/congestion Please contact office for sooner follow up if symptoms do not improve or worsen  follow up Dr. Delford Field as planned.   Medications Added to Medication List This Visit: 1)  Amoxicillin-pot Clavulanate 875-125 Mg Tabs (Amoxicillin-pot clavulanate) .Marland Kitchen.. 1 by mouth two times a day  Other Orders: Est. Patient Level III  ([address])  Patient Instructions: 1)  Augmentin 875mg  two times a day for 7 days w/food  2)  Mucinex DM two times a day as needed cough/congestion 3)  Please contact office for sooner follow up if symptoms do not improve or worsen  4)  follow up Dr. Delford Field as planned.  Prescriptions: AMOXICILLIN-POT CLAVULANATE 875-125 MG TABS (AMOXICILLIN-POT CLAVULANATE) 1 by mouth two times a day  #14 x 0   Entered and Authorized by:   Rubye Oaks NP   Signed by:   Rubye Oaks NP on 04/08/2010   Method used:   Electronically to        [facility] (retail)       [address]       [place], [place]  [id number]       Ph: [phone]       Fax: [phone]  RxID:   [id number]

## 2010-07-01 NOTE — Assessment & Plan Note (Signed)
[facility]                             PULMONARY OFFICE NOTE   CHELBI, HERBER                          MRN:          [id number]  DATE:01/11/2007                            DOB:          Mar 15, 1940    Ms. Kristin Estrada returns in followup.  A 70 year old white female with a  history of increased cough, shortness of breath, hoarseness for three  days, soreness in the throat and chest tightness.  Maintains Advair  250/50 1 spray b.i.d.,  Duoneb b.i.d., Nexium 40 mg daily.   PHYSICAL EXAMINATION:  Temp 98, blood pressure 138/80, pulse 94,  saturation 95% on room air.  CHEST:  Diminished breath sounds.  Prolonged expiratory phase.  No  wheeze or rhonchi noted.  CARDIAC:  Regular rate and rhythm without S3.  Normal S1 and S2.  ABDOMEN:  Soft and nontender.  EXTREMITIES:  No clubbing or edema.  SKIN:  Clear.   IMPRESSION:  Chronic obstructive lung disease with acute bronchitic  flare.   PLAN:  Receive doxycycline 100 mg twice daily for 7 days.  Return to  this office in one month.     Kristin Cradle Delford Field, MD, [facility]  Electronically Signed    PEW/MedQ  DD: 01/11/2007  DT: 01/11/2007  Job #: [id number]

## 2010-07-01 NOTE — Assessment & Plan Note (Signed)
[facility]                             PULMONARY OFFICE NOTE   GWYNETH, FERNANDEZ                          MRN:          [id number]  DATE:11/01/2006                            DOB:          [birth year]    Ms. Kristin Estrada returns in follow-up coughing up thick yellow mucous, noting  increased orthopnea and weakness.  She cannot lay flat at night because  of wheezing.  She added Mucinex to her program without much improvement.  She is on Advair 250/50 one spray b.i.d., DuoNeb b.i.d., Nexium 40 mg  daily, Proair p.r.n.   PHYSICAL EXAMINATION:  VITAL SIGNS:  Temperature 97.6, blood pressure  134/80, pulse 80, saturation 94% on room air.  CHEST:  Expiratory wheezes with poor air flow.  HEART:  Regular rate and rhythm without S3, normal S1 and S2.  ABDOMEN:  Soft and nontender.  EXTREMITIES:  No edema or clubbing.  SKIN:  Clear.   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   PLAN:  She is to receive prednisone 40 mg a day with a rapid taper,  Levaquin 750 mg daily for five days and return to this office in six  weeks.     Charlcie Cradle Delford Field, MD, [facility]  Electronically Signed    PEW/MedQ  DD: 11/01/2006  DT: 11/01/2006  Job #: [id number]   cc:   Levan Hurst, M.D.

## 2010-07-01 NOTE — Assessment & Plan Note (Signed)
[facility]                             PULMONARY OFFICE NOTE   GERARD, BONUS                          MRN:          [id number]  DATE:12/07/2006                            DOB:          16-Sep-1940    HISTORY OF PRESENT ILLNESS:  Ms. Kristin Estrada returns in follow up.  A 70-year-  old white female with history of chronic obstructive lung disease,  asthmatic bronchitis.  She is coughing up thick green mucous, having  just finished a course of Zithromax, and prior to this I gave her five  days of Avelox in mid September.  She is less short of breath and  maintains Advair 250/50 one spray b.i.d., DuoNeb b.i.d.   PHYSICAL EXAMINATION:  VITAL SIGNS:  Temperature 98, blood pressure  160/90, pulse 82, saturation 96% on room air.  CHEST:  Distant breath sounds, scattered rhonchi.  CARDIAC:  Regular rate and rhythm without S3.  Normal S1, S2.  ABDOMEN:  Soft, nontender.  EXTREMITIES:  No edema or clubbing.   IMPRESSION:  Asthmatic bronchitic mild flare.   PLAN:  The patient will receive doxycycline 100 mg b.i.d. for seven  days.  Chest x-ray will be obtained and flu vaccine was administered.     Charlcie Cradle Delford Field, MD, [facility]  Electronically Signed    PEW/MedQ  DD: 12/07/2006  DT: 12/08/2006  Job #: [id number]   cc:   [facility]

## 2010-07-01 NOTE — Assessment & Plan Note (Signed)
[facility]                             PULMONARY OFFICE NOTE   Kristin Estrada, Kristin Estrada                          MRN:          [id number]  DATE:10/07/2006                            DOB:          01/17/1941    Ms. Kristin Estrada is a 70 year old white female, history of chronic obstructive  lung disease, asthmatic bronchitis, overall doing well with no active  respiratory complaints.   She maintains:  1. DuoNeb b.i.d.  2. Advair 250/50 one spray b.i.d.  3. Claritin as needed.  4. She has been using Qvar as needed and I am not sure how she started      that.   EXAMINATION:  Temperature 97, blood pressure 120/80, pulse 81,  saturation 94% room air.  CHEST:  Showed distant breath sounds with prolonged expiratory phase, no  wheeze or rhonchi noted.  CARDIAC:  Showed a regular rate and rhythm without S3, normal S1-S2.  ABDOMEN:  Soft, nontender.  EXTREMITIES:  Showed no edema or clubbing.  SKIN:  Clear.   IMPRESSION:  Chronic obstructive lung disease with asthmatic bronchitic  component.   PLAN:  To maintain Advair twice daily, DuoNeb b.i.d. and begin ProAir  p.r.n. and no longer use Qvar as a rescue inhaler.  She was instructed  as to the proper use of the ProAir inhaler.     Charlcie Cradle Delford Field, MD, [facility]  Electronically Signed    PEW/MedQ  DD: 10/07/2006  DT: 10/08/2006  Job #: [id number]   cc:   Levan Hurst, MD

## 2010-07-04 NOTE — Assessment & Plan Note (Signed)
Kristin Estrada                             PULMONARY OFFICE NOTE   MARKIESHA, DELIA                          MRN:          [id number]  DATE:04/13/2006                            DOB:          05/01/1940    Ms. Benna Dunks is a 70 year old white female with history of chronic  obstructive lung disease, asthmatic bronchitis.  Patient noting some  increase burning in the throat for the last day with increased chest  congestion, loss of voice, no real mucus production.  Maintaining Nexium  40 mg daily, DuoNeb b.i.d., Advair 250/50 one spray b.i.d., Claritin 10  mg daily, HCTZ 25 mg daily.   EXAMINATION:  Temperature 98, blood pressure 118/74, pulse 84,  saturation 94% on room air.  CHEST:  Showed diminished breath sounds, a few expired wheezes.  CARDIAC:  Exam showed a regular rate and rhythm without S3.  Normal S1-  S2.  ABDOMEN:  Soft, nontender.  EXTREMITIES:  Showed no edema or clubbing.  SKIN:  Clear.  NEUROLOGICAL:  Exam was intact.  HEENT:  Exam showed no jugular venous distention.  No lymphadenopathy.  Oropharynx clear.  Neck supple.   IMPRESSION:  Early acute bronchitis with flare.   PLAN:  For the patient to receive Augmentin 875 mg b.i.d. for a 7-day  course.  Other maintenance medicine will be maintained as is and we will  see the patient back in return follow-up.     Charlcie Cradle Delford Field, MD, [facility]  Electronically Signed    PEW/MedQ  DD: 04/13/2006  DT: 04/14/2006  Job #: [id number]

## 2010-07-04 NOTE — Assessment & Plan Note (Signed)
[facility]                             PULMONARY OFFICE NOTE   NETTY, SULLIVANT                          MRN:          [id number]  DATE:05/13/2006                            DOB:          05-Oct-1940    Miss Benna Dunks is a 70 year old white female history of chronic obstructive  lung disease, primary emphysema, ex-smoker. Her acute bronchitis from  February is resolved. She is less short of breath. Maintains;  1. Nexium 40 mg daily.  2. DuoNeb b.i.d.  3. Advair 250/50 one spray b.i.d.  4. Claritin 10 mg daily.  Other maintenance medicines listed in the chart are correct as reviewed.   VITAL SIGNS:  On exam, temperature 97, blood pressure 140/90, pulse 73,  saturation 93% on room air.  CHEST: Showed distant breath sounds with prolonged expiratory phase, no  wheeze or rhonchi were noted.  CARDIAC EXAM: Showed a regular rate and rhythm without S3, normal S1,  S2.  ABDOMEN: Soft, nontender.  EXTREMITIES: Showed no edema or clubbing.  SKIN: Clear.   IMPRESSION:  That of chronic lung disease stable at this time.   PLAN:  Maintain inhaled medicines as currently dosed. We will see the  patient back in return follow up in 3 months.     Charlcie Cradle Delford Field, MD, [facility]  Electronically Signed    PEW/MedQ  DD: 05/13/2006  DT: 05/13/2006  Job #: [id number]

## 2010-07-04 NOTE — Assessment & Plan Note (Signed)
[facility]                               PULMONARY OFFICE NOTE   STUART, GUILLEN                          MRN:          [id number]  DATE:10/15/2005                            DOB:          November 17, 1940    Ms. Kristin Estrada is a 70 year old white female with a history of chronic  obstructive lung disease, asthmatic bronchitis, reflux disease, ex-smoker.  Pulmonary-wise the patient has been doing fairly well with decreased  shortness of breath, decreased cough, no excess mucus production.   MEDICATIONS:  1. She maintains the Advair 250/50 1 spray b.i.d.  2. DuoNeb b.i.d.  3. Nexium 40 mg daily.   EXAMINATION:  VITAL SIGNS:  Temperature 98.  Blood pressure 140/88.  Pulse  84.  Saturation 93% on room air.  CHEST:  Showed diminished breath sounds with long expiratory phase.  No  wheeze or rhonchi noted.  CARDIAC:  Showed a regular rate and rhythm without S3.  Normal S1, S2.  ABDOMEN:  Soft, non-tender.  EXTREMITIES:  Showed no edema or clubbing.  SKIN:  Clear.   IMPRESSION:  1. That of chronic obstructive lung disease.  Stable airway function.   PLAN:  1. To maintain Advair 250/50 1 spray b.i.d.,  DuoNeb b.i.d., Nexium 40 mg      daily.  2. Will see the patient back return in followup four months.                                   Charlcie Cradle Delford Field, MD, [facility]   PEW/MedQ  DD:  10/15/2005  DT:  10/16/2005  Job #:  [id number]

## 2010-07-04 NOTE — Assessment & Plan Note (Signed)
[facility]                             PULMONARY OFFICE NOTE   FLOR, WHITACRE                          MRN:          [id number]  DATE:01/27/2006                            DOB:          1940-10-23    Ms. Benna Dunks is a 70 year old white female with a history of chronic  obstructive lung disease and asthmatic bronchitis. The patient notes  slight increased drainage in the throat, some burning in onset,  occasional cough, some difficulty getting any mucus up, and low grade  fever.   The patient maintains:  1. Advair 250/50 1 spray b.i.d.  2. Nexium 50 mg daily.  3. DuoNeb b.i.d. by nebulization.   PHYSICAL EXAMINATION:  VITAL SIGNS: Temperature 100, pulse 105, blood  pressure 150/84, saturation 90% on room air.  CHEST: Showed rales in the right lung, clear on the left.  CARDIAC: Regular rate and rhythm with resting tachycardia.  ABDOMEN: Soft, nontender.  EXTREMITIES: Showed no edema or clubbing.  SKIN: Clear.  NEUROLOGIC: Intact.  HEENT: No jugular venous distension or lymphadenopathy.  Chest x-ray obtained showed a right upper lobe early infiltrate.   IMPRESSION:  Early community acquired pneumonia, right upper lobe, with  mild chronic obstructive pulmonary disease exacerbation.   PLAN:  Obtain Rocephin 1 gram i. m. and then Avelox 400 mg daily for a 7-  day course. We will see the patient back in return in two weeks.     Charlcie Cradle Delford Field, MD, [facility]  Electronically Signed    PEW/MedQ  DD: 01/27/2006  DT: 01/28/2006  Job #: [id number]

## 2010-07-04 NOTE — Assessment & Plan Note (Signed)
[facility]                             PULMONARY OFFICE NOTE   Kristin, Estrada                          MRN:          [id number]  DATE:06/16/2006                            DOB:          [birth year]    HISTORY OF PRESENT ILLNESS:  The patient is a 70 year old white female  patient of Dr. Lynelle Doctor who has a known history of emphysematous COPD  who presents today for acute office visit. The patient complains of a 4-  day history of nasal congestion, sinus pain and pressure with thick  yellowish-green nasal discharge. The patient denies any hemoptysis,  orthopnea, PND or leg swelling.   PAST MEDICAL HISTORY:  Reviewed.   CURRENT MEDICATIONS:  Reviewed.   PHYSICAL EXAMINATION:  The patient is a pleasant female in no acute  distress. She is afebrile with stable vital signs. O2 saturation was 95%  on room air.  HEENT:  Nasal mucosa was erythematous. Maxillary sinus tenderness.  Posterior pharynx is clear.  NECK:  Is supple without cervical adenopathy. No JVD.  LUNGS:  Sounds reveal decreased breath sounds without any wheezing. No  crackles.  CARDIAC:  Regular rate.  ABDOMEN:  Soft and nontender.  EXTREMITIES:  Warm without any edema.   IMPRESSION AND PLAN:  Acute rhinosinusitis. The patient is given Omnicef  x10 days. Is recommended to use Mucinex DM twice daily. The patient will  return back here with Dr. Delford Field as scheduled or sooner if needed.      Kristin Oaks, NP  Electronically Signed      Charlcie Cradle Delford Field, MD, [facility]  Electronically Signed   TP/MedQ  DD: 06/16/2006  DT: 06/16/2006  Job #: [id number]

## 2010-07-04 NOTE — Assessment & Plan Note (Signed)
[facility]                             PULMONARY OFFICE NOTE   HALLEIGH, COMES                          MRN:          [id number]  DATE:02/10/2006                            DOB:          [birth year]    Ms. Kristin Estrada is a 70 year old white female seen today in return followup.  She was seen on the 12th of December with right upper lobe pneumonia,  community acquired.  She received a course of Rocephin IM, 1 gram, and  then Avelox for 7 days.  She is improving with mild chest tightness and  wheezing.  No chest pain, maintains Nexium 40 mg daily, Duo-Neb b.i.d.,  Advair 250/51 spray b.i.d., HCTZ 25 mg daily, Claritin 10 mg daily,  Amitriptyline 75 mg daily, Diclofenac 75 mg b.i.d.   PHYSICAL EXAMINATION:  VITAL SIGNS:  Temp 97, blood pressure 134/80,  pulse 85, saturation 95% on room air.  CHEST:  Showed to be clear without evidence of wheeze or rhonchi or  other adventitious breath sounds.  CARDIAC:  Exam showed a regular rate and rhythm without S3, normal S1,  S2.  ABDOMEN:  Soft, nontender.  EXTREMITIES:  Showed no edema or clubbing.  SKIN:  Clear.  NEUROLOGIC:  Exam was intact.  HEENT:  Showed no jugular venous distention, lymphadenopathy.  Oropharynx clear.  NECK:  Supple.  CHEST:  Obtained today showed clearance of right upper lobe infiltrate.   IMPRESSION:  Improved and resolved community-acquired pneumonia, right  upper lobe.  No organism specified, with asthmatic bronchitic flare.   PLAN:  For the patient is to maintain, is to increase Advair to 500/51  spray b.i.d. x1 Diskus worth, and then reduce it to 250 strength  thereafter.  No further antibiotics are indicated.  Will see the patient  back in followup in 2 months.     Charlcie Cradle Delford Field, MD, [facility]  Electronically Signed    PEW/MedQ  DD: 02/10/2006  DT: 02/10/2006  Job #: [id number]   cc:   Levan Hurst, M.D.

## 2010-07-25 ENCOUNTER — Encounter: Payer: Self-pay | Admitting: Critical Care Medicine

## 2010-07-28 ENCOUNTER — Ambulatory Visit (INDEPENDENT_AMBULATORY_CARE_PROVIDER_SITE_OTHER): Payer: Medicare Other | Admitting: Critical Care Medicine

## 2010-07-28 ENCOUNTER — Encounter: Payer: Self-pay | Admitting: Critical Care Medicine

## 2010-07-28 VITALS — BP 142/70 | HR 87 | Temp 98.2°F | Ht 64.0 in | Wt 172.4 lb

## 2010-07-28 DIAGNOSIS — J449 Chronic obstructive pulmonary disease, unspecified: Secondary | ICD-10-CM

## 2010-07-28 NOTE — Patient Instructions (Signed)
No change in medications. Return in         4 months 

## 2010-07-28 NOTE — Assessment & Plan Note (Signed)
Stable Golds Stage III Copd Plan No change in inhaled or maintenance medications. Return in  4 months

## 2010-07-28 NOTE — Progress Notes (Signed)
Subjective:    Patient ID: Kristin Estrada, female    DOB: Oct 11, 1940, 70 y.o.   MRN: [id number]  HPI This is a 70 y.o.  WF with hx of asthmatic bronchitis   07/28/2010 Pt has done well since last ov 2/12.  Now no real cough, only occasionally.  Wearing a mask ,  Uses mucinex or pseudophed and helps.  No chest pain.  No real wheeze.  Occ nighttime wheeze. Pt denies any significant sore throat, nasal congestion or excess secretions, fever, chills, sweats, unintended weight loss, pleurtic or exertional chest pain, orthopnea PND, or leg swelling Pt denies any increase in rescue therapy over baseline, denies waking up needing it or having any early am or nocturnal exacerbations of coughing/wheezing/or dyspnea. Pt also denies any obvious fluctuation in symptoms with  weather or environmental change or other alleviating or aggravating factors    Past Medical History  Diagnosis Date  . Acute bronchitis   . Unspecified essential hypertension   . Esophageal reflux   . Chronic airway obstruction, not elsewhere classified      Family History  Problem Relation Age of Onset  . Heart disease       History   Social History  . Marital Status: Widowed    Spouse Name: N/A    Number of Children: N/A  . Years of Education: N/A   Occupational History  . Retired    Social History Main Topics  . Smoking status: Former Smoker -- 1.5 packs/day for 30 years    Types: Cigarettes    Quit date: 02/16/2001  . Smokeless tobacco: Never Used   Comment: 2ppd x 30 years  . Alcohol Use: Not on file  . Drug Use: Not on file  . Sexually Active: Not on file   Other Topics Concern  . Not on file   Social History Narrative  . No narrative on file     Allergies  Allergen Reactions  . Prednisone     REACTION: agitation, insomina, mood change     Outpatient Prescriptions Prior to Visit  Medication Sig Dispense Refill  . albuterol (PROAIR HFA) 108 (90 BASE) MCG/ACT inhaler Inhale 2 puffs into the  lungs every 6 (six) hours as needed.        Marland Kitchen amLODipine (NORVASC) 5 MG tablet Take 5 mg by mouth daily.        . budesonide-formoterol (SYMBICORT) 160-4.5 MCG/ACT inhaler Inhale 2 puffs into the lungs 2 (two) times daily.        . chlorpheniramine-HYDROcodone (TUSSIONEX PENNKINETIC ER) 10-8 MG/5ML LQCR Take 5 mLs by mouth every 12 (twelve) hours as needed.        Marland Kitchen dextromethorphan-guaiFENesin (MUCINEX DM) 30-600 MG per 12 hr tablet Take 1 tablet by mouth every 12 (twelve) hours.        . diclofenac (VOLTAREN) 75 MG EC tablet Take 75 mg by mouth 2 (two) times daily.        Marland Kitchen esomeprazole (NEXIUM) 40 MG capsule Take 40 mg by mouth daily before breakfast. 30 minutes before eating       . ipratropium-albuterol (DUONEB) 0.5-2.5 (3) MG/3ML SOLN Take 3 mLs by nebulization 2 (two) times daily. May use as needed also       . nystatin (MYCOSTATIN) 100000 UNIT/ML suspension Take 500,000 Units by mouth 3 (three) times daily as needed.        . traMADol (ULTRAM) 50 MG tablet Take 50 mg by mouth every 6 (six) hours as needed.        Marland Kitchen  zolpidem (AMBIEN) 10 MG tablet Take 10 mg by mouth at bedtime as needed.        . fluticasone (FLONASE) 50 MCG/ACT nasal spray Place 2 sprays into the nose daily.            Review of Systems Constitutional:   No  weight loss, night sweats,  Fevers, chills, fatigue, lassitude. HEENT:   No headaches,  Difficulty swallowing,  Tooth/dental problems,  Sore throat,                No sneezing, itching, ear ache, nasal congestion, post nasal drip,   CV:  No chest pain,  Orthopnea, PND, swelling in lower extremities, anasarca, dizziness, palpitations  GI  No heartburn, indigestion, abdominal pain, nausea, vomiting, diarrhea, change in bowel habits, loss of appetite  Resp: Notes  shortness of breath with exertion not at rest.  No excess mucus, no productive cough,  Notes  non-productive cough,  No coughing up of blood.  No change in color of mucus.  No wheezing.  No chest wall  deformity  Skin: no rash or lesions.  GU: no dysuria, change in color of urine, no urgency or frequency.  No flank pain.  MS:  No joint pain or swelling.  No decreased range of motion.  No back pain.  Psych:  No change in mood or affect. No depression or anxiety.  No memory loss.     Objective:   Physical Exam Filed Vitals:   07/28/10 1449  BP: 142/70  Pulse: 87  Temp: 98.2 F (36.8 C)  TempSrc: Oral  Height: 5\' 4"  (1.626 m)  Weight: 172 lb 6.4 oz (78.2 kg)  SpO2: 92%    Gen: Pleasant, well-nourished, in no distress,  normal affect  ENT: No lesions,  mouth clear,  oropharynx clear, no postnasal drip  Neck: No JVD, no TMG, no carotid bruits  Lungs: No use of accessory muscles, no dullness to percussion, distant BS  Cardiovascular: RRR, heart sounds normal, no murmur or gallops, no peripheral edema  Abdomen: soft and NT, no HSM,  BS normal  Musculoskeletal: No deformities, no cyanosis or clubbing  Neuro: alert, non focal  Skin: Warm, no lesions or rashes        Assessment & Plan:   COPD Stable Golds Stage III Copd Plan No change in inhaled or maintenance medications. Return in  4 months     Updated Medication List Outpatient Encounter Prescriptions as of 07/28/2010  Medication Sig Dispense Refill  . albuterol (PROAIR HFA) 108 (90 BASE) MCG/ACT inhaler Inhale 2 puffs into the lungs every 6 (six) hours as needed.        Marland Kitchen amLODipine (NORVASC) 5 MG tablet Take 5 mg by mouth daily.        . budesonide-formoterol (SYMBICORT) 160-4.5 MCG/ACT inhaler Inhale 2 puffs into the lungs 2 (two) times daily.        . chlorpheniramine-HYDROcodone (TUSSIONEX PENNKINETIC ER) 10-8 MG/5ML LQCR Take 5 mLs by mouth every 12 (twelve) hours as needed.        Marland Kitchen dextromethorphan-guaiFENesin (MUCINEX DM) 30-600 MG per 12 hr tablet Take 1 tablet by mouth every 12 (twelve) hours.        . diclofenac (VOLTAREN) 75 MG EC tablet Take 75 mg by mouth 2 (two) times daily.        Marland Kitchen  esomeprazole (NEXIUM) 40 MG capsule Take 40 mg by mouth daily before breakfast. 30 minutes before eating       . ipratropium-albuterol (  DUONEB) 0.5-2.5 (3) MG/3ML SOLN Take 3 mLs by nebulization 2 (two) times daily. May use as needed also       . nystatin (MYCOSTATIN) 100000 UNIT/ML suspension Take 500,000 Units by mouth 3 (three) times daily as needed.        . traMADol (ULTRAM) 50 MG tablet Take 50 mg by mouth every 6 (six) hours as needed.        . zolpidem (AMBIEN) 10 MG tablet Take 10 mg by mouth at bedtime as needed.        Marland Kitchen DISCONTD: fluticasone (FLONASE) 50 MCG/ACT nasal spray Place 2 sprays into the nose daily.

## 2010-08-13 ENCOUNTER — Ambulatory Visit (INDEPENDENT_AMBULATORY_CARE_PROVIDER_SITE_OTHER): Payer: Medicare Other | Admitting: Critical Care Medicine

## 2010-08-13 ENCOUNTER — Encounter: Payer: Self-pay | Admitting: Critical Care Medicine

## 2010-08-13 VITALS — BP 160/78 | HR 75 | Temp 97.2°F | Ht 64.0 in | Wt 173.6 lb

## 2010-08-13 DIAGNOSIS — J449 Chronic obstructive pulmonary disease, unspecified: Secondary | ICD-10-CM

## 2010-08-13 MED ORDER — MOXIFLOXACIN HCL 400 MG PO TABS: 400.0000 mg | ORAL_TABLET | Freq: Every day | ORAL | Status: AC

## 2010-08-13 NOTE — Progress Notes (Signed)
Subjective:    Patient ID: Kristin Estrada, female    DOB: Jun 04, 1940, 70 y.o.   MRN: [id number]  HPI  This is a 70 y.o.  WF with hx of asthmatic bronchitis   08/13/2010 Was well 07/29/10 then got [place].  Had to go to ED with husband. Now mucus is yellow to green.  Notes more dyspnea    Past Medical History  Diagnosis Date  . Acute bronchitis   . Unspecified essential hypertension   . Esophageal reflux   . Chronic airway obstruction, not elsewhere classified      Family History  Problem Relation Age of Onset  . Heart disease       History   Social History  . Marital Status: Widowed    Spouse Name: N/A    Number of Children: N/A  . Years of Education: N/A   Occupational History  . Retired    Social History Main Topics  . Smoking status: Former Smoker -- 1.5 packs/day for 30 years    Types: Cigarettes    Quit date: 02/16/2001  . Smokeless tobacco: Never Used   Comment: 2ppd x 30 years  . Alcohol Use: Not on file  . Drug Use: Not on file  . Sexually Active: Not on file   Other Topics Concern  . Not on file   Social History Narrative  . No narrative on file     Allergies  Allergen Reactions  . Prednisone     REACTION: agitation, insomina, mood change     Outpatient Prescriptions Prior to Visit  Medication Sig Dispense Refill  . albuterol (PROAIR HFA) 108 (90 BASE) MCG/ACT inhaler Inhale 2 puffs into the lungs every 6 (six) hours as needed.        Marland Kitchen amLODipine (NORVASC) 5 MG tablet Take 5 mg by mouth daily.        . budesonide-formoterol (SYMBICORT) 160-4.5 MCG/ACT inhaler Inhale 2 puffs into the lungs 2 (two) times daily.        . chlorpheniramine-HYDROcodone (TUSSIONEX PENNKINETIC ER) 10-8 MG/5ML LQCR Take 5 mLs by mouth every 12 (twelve) hours as needed.        Marland Kitchen dextromethorphan-guaiFENesin (MUCINEX DM) 30-600 MG per 12 hr tablet Take 1 tablet by mouth every 12 (twelve) hours.        . diclofenac  (VOLTAREN) 75 MG EC tablet Take 75 mg by mouth 2 (two) times daily.        Marland Kitchen esomeprazole (NEXIUM) 40 MG capsule Take 40 mg by mouth daily before breakfast. 30 minutes before eating       . ipratropium-albuterol (DUONEB) 0.5-2.5 (3) MG/3ML SOLN Take 3 mLs by nebulization 2 (two) times daily. May use as needed also       . nystatin (MYCOSTATIN) 100000 UNIT/ML suspension Take 500,000 Units by mouth 3 (three) times daily as needed.        . traMADol (ULTRAM) 50 MG tablet Take 50 mg by mouth every 6 (six) hours as needed.        . zolpidem (AMBIEN) 10 MG tablet Take 10 mg by mouth at bedtime as needed.            Review of Systems  Constitutional:   No  weight loss, night sweats,  Fevers, chills, fatigue, lassitude. HEENT:   No headaches,  Difficulty swallowing,  Tooth/dental problems,  Sore throat,  No sneezing, itching, ear ache, nasal congestion, post nasal drip,   CV:  No chest pain,  Orthopnea, PND, swelling in lower extremities, anasarca, dizziness, palpitations  GI  No heartburn, indigestion, abdominal pain, nausea, vomiting, diarrhea, change in bowel habits, loss of appetite  Resp: Notes  shortness of breath with exertion not at rest.  No excess mucus, no productive cough,  Notes  non-productive cough,  No coughing up of blood.  No change in color of mucus.  No wheezing.  No chest wall deformity  Skin: no rash or lesions.  GU: no dysuria, change in color of urine, no urgency or frequency.  No flank pain.  MS:  No joint pain or swelling.  No decreased range of motion.  No back pain.  Psych:  No change in mood or affect. No depression or anxiety.  No memory loss.     Objective:   Physical Exam  Filed Vitals:   08/13/10 1543  BP: 160/78  Pulse: 75  Temp: 97.2 F (36.2 C)  TempSrc: Oral  Height: 5\' 4"  (1.626 m)  Weight: 173 lb 9.6 oz (78.744 kg)  SpO2: 94%    Gen: Pleasant, well-nourished, in no distress,  normal affect  ENT: No lesions,  mouth clear,   oropharynx clear, no postnasal drip  Neck: No JVD, no TMG, no carotid bruits  Lungs: No use of accessory muscles, no dullness to percussion, distant BS, scattered rhonchi RUL  Cardiovascular: RRR, heart sounds normal, no murmur or gallops, no peripheral edema  Abdomen: soft and NT, no HSM,  BS normal  Musculoskeletal: No deformities, no cyanosis or clubbing  Neuro: alert, non focal  Skin: Warm, no lesions or rashes        Assessment & Plan:   COPD COPD with acute tracheobronchitis exacerbation Plan Avelox 400mg  /d x 5days No prednisone d/t side effects No change inhaled meds      Updated Medication List Outpatient Encounter Prescriptions as of 08/13/2010  Medication Sig Dispense Refill  . albuterol (PROAIR HFA) 108 (90 BASE) MCG/ACT inhaler Inhale 2 puffs into the lungs every 6 (six) hours as needed.        Marland Kitchen amLODipine (NORVASC) 5 MG tablet Take 5 mg by mouth daily.        . budesonide-formoterol (SYMBICORT) 160-4.5 MCG/ACT inhaler Inhale 2 puffs into the lungs 2 (two) times daily.        . cetirizine (ZYRTEC) 10 MG tablet Take 10 mg by mouth daily.        . chlorpheniramine-HYDROcodone (TUSSIONEX PENNKINETIC ER) 10-8 MG/5ML LQCR Take 5 mLs by mouth every 12 (twelve) hours as needed.        Marland Kitchen dextromethorphan-guaiFENesin (MUCINEX DM) 30-600 MG per 12 hr tablet Take 1 tablet by mouth every 12 (twelve) hours.        . diclofenac (VOLTAREN) 75 MG EC tablet Take 75 mg by mouth 2 (two) times daily.        Marland Kitchen esomeprazole (NEXIUM) 40 MG capsule Take 40 mg by mouth daily before breakfast. 30 minutes before eating       . ipratropium-albuterol (DUONEB) 0.5-2.5 (3) MG/3ML SOLN Take 3 mLs by nebulization 2 (two) times daily. May use as needed also       . nystatin (MYCOSTATIN) 100000 UNIT/ML suspension Take 500,000 Units by mouth 3 (three) times daily as needed.        . traMADol (ULTRAM) 50 MG tablet Take 50 mg by mouth every 6 (six) hours as needed.        Marland Kitchen  zolpidem (AMBIEN) 10  MG tablet Take 10 mg by mouth at bedtime as needed.        . moxifloxacin (AVELOX) 400 MG tablet Take 1 tablet (400 mg total) by mouth daily.  5 tablet  0

## 2010-08-13 NOTE — Patient Instructions (Signed)
Take avelox one daily for 5 days (use samples) No other medication changes Call if unimproving

## 2010-08-13 NOTE — Assessment & Plan Note (Signed)
COPD with acute tracheobronchitis exacerbation Plan Avelox 400mg  /d x 5days No prednisone d/t side effects No change inhaled meds

## 2010-08-21 ENCOUNTER — Telehealth: Payer: Self-pay | Admitting: Critical Care Medicine

## 2010-08-21 MED ORDER — PREDNISONE 10 MG PO TABS: ORAL_TABLET | ORAL | Status: DC

## 2010-08-21 NOTE — Telephone Encounter (Signed)
Pt aware Dr. Maple Hudson is prescribing Prednisone taper and this was sent to [facility] in [place]. Pt will call if she does not improve.

## 2010-08-21 NOTE — Telephone Encounter (Signed)
Spoke with the pt and she states she saw Dr. Delford Field on 08-13-10 and was given avelox x 5 days. She states he cough is some improved but she still has a raw feeling in her chest, she still feels like she has phlegm stuck in her throat and she is wheezing. She states she is able to get some phlegm up and it is thick and white in color. She states she is better but not back to baseline. She states Dr. Delford Field mentioned prednisone at last appt, but due to pt intolerance he decide not to give her any. The pt states she is willing to take prednisone if MD thought it was what would make her well. Please advise. [name], CMA Allergies  Allergen Reactions  . Prednisone     REACTION: agitation, insomina, mood change

## 2010-08-21 NOTE — Telephone Encounter (Signed)
ok 

## 2010-08-21 NOTE — Telephone Encounter (Signed)
Per CY-okay to give Prednisone 10 mg #20 take 4 x 2 days, 3 x 2 days, 2 x 2 days, 1 x 2 days, then stop no refills. Stop Early if necessary.

## 2010-11-03 ENCOUNTER — Other Ambulatory Visit: Payer: Self-pay | Admitting: *Deleted

## 2010-11-03 MED ORDER — BUDESONIDE-FORMOTEROL FUMARATE 160-4.5 MCG/ACT IN AERO: 2.0000 | INHALATION_SPRAY | Freq: Two times a day (BID) | RESPIRATORY_TRACT | Status: DC

## 2010-11-03 MED ORDER — ESOMEPRAZOLE MAGNESIUM 40 MG PO CPDR: 40.0000 mg | DELAYED_RELEASE_CAPSULE | Freq: Every day | ORAL | Status: DC

## 2010-11-26 ENCOUNTER — Encounter: Payer: Self-pay | Admitting: Adult Health

## 2010-11-26 ENCOUNTER — Ambulatory Visit (INDEPENDENT_AMBULATORY_CARE_PROVIDER_SITE_OTHER)
Admission: RE | Admit: 2010-11-26 | Discharge: 2010-11-26 | Disposition: A | Discharge status: Home / Self Care | Payer: Medicare Other | Source: Ambulatory Visit | Attending: Adult Health | Admitting: Adult Health

## 2010-11-26 ENCOUNTER — Ambulatory Visit (INDEPENDENT_AMBULATORY_CARE_PROVIDER_SITE_OTHER): Payer: Medicare Other | Admitting: Adult Health

## 2010-11-26 VITALS — BP 122/70 | HR 73 | Temp 97.7°F | Ht 64.0 in | Wt 175.2 lb

## 2010-11-26 DIAGNOSIS — J449 Chronic obstructive pulmonary disease, unspecified: Secondary | ICD-10-CM

## 2010-11-26 MED ORDER — AMOXICILLIN-POT CLAVULANATE 875-125 MG PO TABS: 1.0000 | ORAL_TABLET | Freq: Two times a day (BID) | ORAL | Status: AC

## 2010-11-26 NOTE — Assessment & Plan Note (Signed)
Slow to resolve Flare  cxr pending.   Plan  Augmentin 875mg  Twice daily  For 7 days with food Mucinex DM Twice daily  As needed  Cough/congestion  Fluids and rest  Please contact office for sooner follow up if symptoms do not improve or worsen or seek emergency care  follow up Dr. Delford Field  As planned

## 2010-11-26 NOTE — Progress Notes (Signed)
Addended by: Marcellus Scott on: 11/26/2010 11:36 AM   Modules accepted: Orders

## 2010-11-26 NOTE — Patient Instructions (Signed)
Augmentin 875mg  Twice daily  For 7 days with food Mucinex DM Twice daily  As needed  Cough/congestion  Fluids and rest  Please contact office for sooner follow up if symptoms do not improve or worsen or seek emergency care  follow up Dr. Delford Field  As planned

## 2010-11-26 NOTE — Progress Notes (Signed)
Subjective:    Patient ID: Kristin Estrada, female    DOB: 1941-02-15, 70 y.o.   MRN: [id number]  HPI  This is a 70 y.o.  WF with hx of asthmatic bronchitis   08/13/2010 Was well 07/29/10 then got [place].  Had to go to ED with husband. Now mucus is yellow to green.  Notes more dyspnea >Avelox  rx   11/26/2010 Acute OV  Pt presents for cough, congestion for 1 week. Seen by PCP last week and given Bactrim . Still has  Productive cough (yellow- white) - SOB about the same - Wheezing. Coughing really bad at times.  Cough keeping her up at night, tussionex helps. OTC not helping.  No ER or hospitalizations since last ov.   Past Medical History  Diagnosis Date  . Acute bronchitis   . Unspecified essential hypertension   . Esophageal reflux   . Chronic airway obstruction, not elsewhere classified      Family History  Problem Relation Age of Onset  . Heart disease       History   Social History  . Marital Status: Widowed    Spouse Name: N/A    Number of Children: N/A  . Years of Education: N/A   Occupational History  . Retired    Social History Main Topics  . Smoking status: Former Smoker -- 1.5 packs/day for 30 years    Types: Cigarettes    Quit date: 02/16/2001  . Smokeless tobacco: Never Used   Comment: 2ppd x 30 years  . Alcohol Use: Not on file  . Drug Use: Not on file  . Sexually Active: Not on file   Other Topics Concern  . Not on file   Social History Narrative  . No narrative on file     Allergies  Allergen Reactions  . Prednisone     REACTION: agitation, insomina, mood change     Outpatient Prescriptions Prior to Visit  Medication Sig Dispense Refill  . albuterol (PROAIR HFA) 108 (90 BASE) MCG/ACT inhaler Inhale 2 puffs into the lungs every 6 (six) hours as needed.        Marland Kitchen amLODipine (NORVASC) 5 MG tablet Take 5 mg by mouth daily.        . budesonide-formoterol (SYMBICORT) 160-4.5 MCG/ACT inhaler  Inhale 2 puffs into the lungs 2 (two) times daily.  1 Inhaler  6  . cetirizine (ZYRTEC) 10 MG tablet Take 10 mg by mouth daily.        . chlorpheniramine-HYDROcodone (TUSSIONEX PENNKINETIC ER) 10-8 MG/5ML LQCR Take 5 mLs by mouth every 12 (twelve) hours as needed.        Marland Kitchen dextromethorphan-guaiFENesin (MUCINEX DM) 30-600 MG per 12 hr tablet Take 1 tablet by mouth every 12 (twelve) hours.        . diclofenac (VOLTAREN) 75 MG EC tablet Take 75 mg by mouth 2 (two) times daily.        Marland Kitchen esomeprazole (NEXIUM) 40 MG capsule Take 1 capsule (40 mg total) by mouth daily before breakfast. 30 minutes before eating  30 capsule  6  . ipratropium-albuterol (DUONEB) 0.5-2.5 (3) MG/3ML SOLN Take 3 mLs by nebulization 2 (two) times daily. May use as needed also       . nystatin (MYCOSTATIN) 100000 UNIT/ML suspension Take 500,000 Units by mouth 3 (three) times daily as needed.        . traMADol (ULTRAM) 50 MG tablet Take 50  mg by mouth every 6 (six) hours as needed.        . zolpidem (AMBIEN) 10 MG tablet Take 10 mg by mouth at bedtime as needed.        . predniSONE (DELTASONE) 10 MG tablet Take 4 by mouth x 2 days, 3 by mouth x 2 days, 2 by mouth x 2 days, 1 by mouth x 2 days then stop.  20 tablet  0      Review of Systems  Constitutional:   No  weight loss, night sweats,  Fevers, chills, fatigue, lassitude. HEENT:   No headaches,  Difficulty swallowing,  Tooth/dental problems,  Sore throat,                No sneezing, itching, ear ache,  +nasal congestion, post nasal drip,   CV:  No chest pain,  Orthopnea, PND, swelling in lower extremities, anasarca, dizziness, palpitations  GI  No heartburn, indigestion, abdominal pain, nausea, vomiting, diarrhea, change in bowel habits, loss of appetite  Resp:  ,  No coughing up of blood.  N   No chest wall deformity  Skin: no rash or lesions.  GU: no dysuria, change in color of urine, no urgency or frequency.  No flank pain.  MS:  No joint pain or swelling.  No  decreased range of motion.  No back pain.  Psych:  No change in mood or affect. No depression or anxiety.  No memory loss.     Objective:   Physical Exam  Filed Vitals:   11/26/10 1059  BP: 122/70  Pulse: 73  Temp: 97.7 F (36.5 C)  TempSrc: Oral  Height: 5\' 4"  (1.626 m)  Weight: 175 lb 3.2 oz (79.47 kg)  SpO2: 96%    Gen: Pleasant, well-nourished, in no distress,  normal affect  ENT: No lesions,  mouth clear,  oropharynx clear, no postnasal drip  Neck: No JVD, no TMG, no carotid bruits  Lungs: No use of accessory muscles, no dullness to percussion, distant BS, scattered rhonchi  Cardiovascular: RRR, heart sounds normal, no murmur or gallops, no peripheral edema  Abdomen: soft and NT, no HSM,  BS normal  Musculoskeletal: No deformities, no cyanosis or clubbing  Neuro: alert, non focal  Skin: Warm, no lesions or rashes        Assessment & Plan:   No problem-specific assessment & plan notes found for this encounter.   Updated Medication List Outpatient Encounter Prescriptions as of 11/26/2010  Medication Sig Dispense Refill  . albuterol (PROAIR HFA) 108 (90 BASE) MCG/ACT inhaler Inhale 2 puffs into the lungs every 6 (six) hours as needed.        Marland Kitchen amLODipine (NORVASC) 5 MG tablet Take 5 mg by mouth daily.        . budesonide-formoterol (SYMBICORT) 160-4.5 MCG/ACT inhaler Inhale 2 puffs into the lungs 2 (two) times daily.  1 Inhaler  6  . cetirizine (ZYRTEC) 10 MG tablet Take 10 mg by mouth daily.        . chlorpheniramine-HYDROcodone (TUSSIONEX PENNKINETIC ER) 10-8 MG/5ML LQCR Take 5 mLs by mouth every 12 (twelve) hours as needed.        . Coenzyme Q10 (CO Q 10) 10 MG CAPS Take 1 capsule by mouth daily.        Marland Kitchen dextromethorphan-guaiFENesin (MUCINEX DM) 30-600 MG per 12 hr tablet Take 1 tablet by mouth every 12 (twelve) hours.        . diclofenac (VOLTAREN) 75 MG EC tablet Take  75 mg by mouth 2 (two) times daily.        Marland Kitchen esomeprazole (NEXIUM) 40 MG capsule  Take 1 capsule (40 mg total) by mouth daily before breakfast. 30 minutes before eating  30 capsule  6  . ipratropium-albuterol (DUONEB) 0.5-2.5 (3) MG/3ML SOLN Take 3 mLs by nebulization 2 (two) times daily. May use as needed also       . Magnesium 400 MG CAPS Take 1 capsule by mouth daily.        . Misc Natural Products (OSTEO BI-FLEX TRIPLE STRENGTH PO) Take 1 tablet by mouth daily.        Marland Kitchen nystatin (MYCOSTATIN) 100000 UNIT/ML suspension Take 500,000 Units by mouth 3 (three) times daily as needed.        . Omega-3 Fatty Acids (FISH OIL) 1200 MG CAPS Take 1 capsule by mouth daily.        . traMADol (ULTRAM) 50 MG tablet Take 50 mg by mouth every 6 (six) hours as needed.        . zolpidem (AMBIEN) 10 MG tablet Take 10 mg by mouth at bedtime as needed.        Marland Kitchen DISCONTD: predniSONE (DELTASONE) 10 MG tablet Take 4 by mouth x 2 days, 3 by mouth x 2 days, 2 by mouth x 2 days, 1 by mouth x 2 days then stop.  20 tablet  0

## 2010-12-08 ENCOUNTER — Telehealth: Payer: Self-pay | Admitting: Critical Care Medicine

## 2010-12-08 MED ORDER — MOXIFLOXACIN HCL 400 MG PO TABS: 400.0000 mg | ORAL_TABLET | Freq: Every day | ORAL | Status: DC

## 2010-12-08 NOTE — Telephone Encounter (Signed)
Would prefer her to come in for OV , but if she will not can have  Avelox 400mg  daily #7 , no refills If not improving will need ov sooner, I will work in before next week.  mucinex dm Twice daily  As needed   Fluids and rest Tylenol As needed   Please contact office for sooner follow up if symptoms do not improve or worsen or seek emergency care

## 2010-12-08 NOTE — Telephone Encounter (Signed)
Called and spoke with pt.  Pt states she was recently seen by TP on 11/26/10.  Was given rx for Augmentin.  Pt states she improved after taking Augmentin but states started feeling 'bad" again over the weekend on Saturday.  C/o non productive cough, unable to get sputum up, occ. Rattling in chest, hoarsness, fever last night of 101.7, chills, sweats, and wheezing.  Denies any increased sob from her normal baseline and no sore throat or tightness in chest.  Pt requesting abx.  Pt does have pending appt with PW for 10/30.  Pt is taking Mucinex with no relief of symptoms.  TP, please advise.  Thanks.

## 2010-12-08 NOTE — Telephone Encounter (Signed)
Called and spoke with pt.  Pt declined appt stating she didn't feel well to come in.  Therefore, informed pt of TP's recs.  Rx sent to pharmacy.  Pt aware.  Pt was instructed to call back and schedule ov if symptoms do not improve or worsen or to seek emergency care if needed.

## 2010-12-16 ENCOUNTER — Encounter: Payer: Self-pay | Admitting: Critical Care Medicine

## 2010-12-16 ENCOUNTER — Ambulatory Visit (INDEPENDENT_AMBULATORY_CARE_PROVIDER_SITE_OTHER): Payer: Medicare Other | Admitting: Critical Care Medicine

## 2010-12-16 VITALS — BP 110/72 | HR 80 | Temp 98.4°F | Ht 64.0 in | Wt 175.2 lb

## 2010-12-16 DIAGNOSIS — Z23 Encounter for immunization: Secondary | ICD-10-CM

## 2010-12-16 DIAGNOSIS — J4489 Other specified chronic obstructive pulmonary disease: Secondary | ICD-10-CM

## 2010-12-16 DIAGNOSIS — J449 Chronic obstructive pulmonary disease, unspecified: Secondary | ICD-10-CM

## 2010-12-16 MED ORDER — FLUTICASONE FUROATE 27.5 MCG/SPRAY NA SUSP: 2.0000 | Freq: Every day | NASAL | Status: DC

## 2010-12-16 NOTE — Progress Notes (Signed)
Subjective:    Patient ID: Kristin Estrada, female    DOB: 03/24/1940, 70 y.o.   MRN: [id number]  HPI  This is a 70 y.o.  WF with hx of asthmatic bronchitis   08/13/2010 Was well 07/29/10 then got [place].  Had to go to ED with husband. Now mucus is yellow to green.  Notes more dyspnea >Avelox  rx   10/10Acute OV  Pt presents for cough, congestion for 1 week. Seen by PCP last week and given Bactrim . Still has  Productive cough (yellow- white) - SOB about the same - Wheezing. Coughing really bad at times.  Cough keeping her up at night, tussionex helps. OTC not helping.  No ER or hospitalizations since last ov.    10/30 Pt was seen 10/10 and rx augmentin , then better then worse and then on 10/22:  Called avelox called in for the pt. The second round helped but still not well.  Still with pndrip,  Sore throat.  Notes some sinus pressure.  No real chest pain.  No real wheeze.    Past Medical History  Diagnosis Date  . Acute bronchitis   . Unspecified essential hypertension   . Esophageal reflux   . Chronic airway obstruction, not elsewhere classified      Family History  Problem Relation Age of Onset  . Heart disease       History   Social History  . Marital Status: Widowed    Spouse Name: N/A    Number of Children: N/A  . Years of Education: N/A   Occupational History  . Retired    Social History Main Topics  . Smoking status: Former Smoker -- 1.5 packs/day for 30 years    Types: Cigarettes    Quit date: 02/16/2001  . Smokeless tobacco: Never Used   Comment: 2ppd x 30 years  . Alcohol Use: Not on file  . Drug Use: Not on file  . Sexually Active: Not on file   Other Topics Concern  . Not on file   Social History Narrative  . No narrative on file     Allergies  Allergen Reactions  . Prednisone     REACTION: agitation, insomina, mood change     Outpatient Prescriptions Prior to Visit  Medication Sig  Dispense Refill  . albuterol (PROAIR HFA) 108 (90 BASE) MCG/ACT inhaler Inhale 2 puffs into the lungs every 6 (six) hours as needed.        Marland Kitchen amLODipine (NORVASC) 5 MG tablet Take 5 mg by mouth daily.        . budesonide-formoterol (SYMBICORT) 160-4.5 MCG/ACT inhaler Inhale 2 puffs into the lungs 2 (two) times daily.  1 Inhaler  6  . cetirizine (ZYRTEC) 10 MG tablet Take 10 mg by mouth daily.        . chlorpheniramine-HYDROcodone (TUSSIONEX PENNKINETIC ER) 10-8 MG/5ML LQCR Take 5 mLs by mouth every 12 (twelve) hours as needed.        . Coenzyme Q10 (CO Q 10) 10 MG CAPS Take 1 capsule by mouth daily.        Marland Kitchen dextromethorphan-guaiFENesin (MUCINEX DM) 30-600 MG per 12 hr tablet Take 1 tablet by mouth every 12 (twelve) hours as needed.       . diclofenac (VOLTAREN) 75 MG EC tablet Take 75 mg by mouth 2 (two) times daily.        Marland Kitchen esomeprazole (NEXIUM) 40 MG capsule  Take 1 capsule (40 mg total) by mouth daily before breakfast. 30 minutes before eating  30 capsule  6  . ipratropium-albuterol (DUONEB) 0.5-2.5 (3) MG/3ML SOLN Take 3 mLs by nebulization 2 (two) times daily. May use as needed also       . Magnesium 400 MG CAPS Take 1 capsule by mouth daily.        . Misc Natural Products (OSTEO BI-FLEX TRIPLE STRENGTH PO) Take 1 tablet by mouth daily.        Marland Kitchen nystatin (MYCOSTATIN) 100000 UNIT/ML suspension Take 500,000 Units by mouth 3 (three) times daily as needed.        . Omega-3 Fatty Acids (FISH OIL) 1200 MG CAPS Take 1 capsule by mouth daily.        . traMADol (ULTRAM) 50 MG tablet Take 50 mg by mouth every 6 (six) hours as needed.        . zolpidem (AMBIEN) 10 MG tablet Take 10 mg by mouth at bedtime as needed.        . moxifloxacin (AVELOX) 400 MG tablet Take 1 tablet (400 mg total) by mouth daily.  7 tablet  0      Review of Systems  Constitutional:   No  weight loss, night sweats,  Fevers, chills, fatigue, lassitude. HEENT:   No headaches,  Difficulty swallowing,  Tooth/dental problems,   Sore throat,                No sneezing, itching, ear ache,  +nasal congestion, post nasal drip,   CV:  No chest pain,  Orthopnea, PND, swelling in lower extremities, anasarca, dizziness, palpitations  GI  No heartburn, indigestion, abdominal pain, nausea, vomiting, diarrhea, change in bowel habits, loss of appetite  Resp:  ,  No coughing up of blood.     No chest wall deformity  Skin: no rash or lesions.  GU: no dysuria, change in color of urine, no urgency or frequency.  No flank pain.  MS:  No joint pain or swelling.  No decreased range of motion.  No back pain.  Psych:  No change in mood or affect. No depression or anxiety.  No memory loss.     Objective:   Physical Exam  Filed Vitals:   12/16/10 1355  BP: 110/72  Pulse: 80  Temp: 98.4 F (36.9 C)  TempSrc: Oral  Height: 5\' 4"  (1.626 m)  Weight: 175 lb 3.2 oz (79.47 kg)  SpO2: 93%    Gen: Pleasant, well-nourished, in no distress,  normal affect  ENT: No lesions,  mouth clear,  oropharynx clear, no postnasal drip  Neck: No JVD, no TMG, no carotid bruits  Lungs: No use of accessory muscles, no dullness to percussion, distant BS,   Cardiovascular: RRR, heart sounds normal, no murmur or gallops, no peripheral edema  Abdomen: soft and NT, no HSM,  BS normal  Musculoskeletal: No deformities, no cyanosis or clubbing  Neuro: alert, non focal  Skin: Warm, no lesions or rashes        Assessment & Plan:   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 albuterol (PROAIR HFA) 108 (90 BASE) MCG/ACT inhaler Inhale 2 puffs into the lungs every 6 (six) hours as needed.        Marland Kitchen amLODipine (NORVASC)  5 MG tablet Take 5 mg by mouth daily.        . budesonide-formoterol  (SYMBICORT) 160-4.5 MCG/ACT inhaler Inhale 2 puffs into the lungs 2 (two) times daily.  1 Inhaler  6  . cetirizine (ZYRTEC) 10 MG tablet Take 10 mg by mouth daily.        . chlorpheniramine-HYDROcodone (TUSSIONEX PENNKINETIC ER) 10-8 MG/5ML LQCR Take 5 mLs by mouth every 12 (twelve) hours as needed.        . Coenzyme Q10 (CO Q 10) 10 MG CAPS Take 1 capsule by mouth daily.        Marland Kitchen dextromethorphan-guaiFENesin (MUCINEX DM) 30-600 MG per 12 hr tablet Take 1 tablet by mouth every 12 (twelve) hours as needed.       . diclofenac (VOLTAREN) 75 MG EC tablet Take 75 mg by mouth 2 (two) times daily.        Marland Kitchen esomeprazole (NEXIUM) 40 MG capsule Take 1 capsule (40 mg total) by mouth daily before breakfast. 30 minutes before eating  30 capsule  6  . ipratropium-albuterol (DUONEB) 0.5-2.5 (3) MG/3ML SOLN Take 3 mLs by nebulization 2 (two) times daily. May use as needed also       . Magnesium 400 MG CAPS Take 1 capsule by mouth daily.        . Misc Natural Products (OSTEO BI-FLEX TRIPLE STRENGTH PO) Take 1 tablet by mouth daily.        Marland Kitchen nystatin (MYCOSTATIN) 100000 UNIT/ML suspension Take 500,000 Units by mouth 3 (three) times daily as needed.        . Omega-3 Fatty Acids (FISH OIL) 1200 MG CAPS Take 1 capsule by mouth daily.        . traMADol (ULTRAM) 50 MG tablet Take 50 mg by mouth every 6 (six) hours as needed.        . traZODone (DESYREL) 50 MG tablet Take 50 mg by mouth at bedtime as needed.        . zolpidem (AMBIEN) 10 MG tablet Take 10 mg by mouth at bedtime as needed.        . fluticasone (VERAMYST) 27.5 MCG/SPRAY nasal spray Place 2 sprays into the nose daily.  10 g  1  . DISCONTD: moxifloxacin (AVELOX) 400 MG tablet Take 1 tablet (400 mg total) by mouth daily.  7 tablet  0

## 2010-12-16 NOTE — Patient Instructions (Addendum)
Flu vaccine today Trial veramyst daily, use samples No change in medications. Return in          3 months

## 2010-12-17 NOTE — Assessment & Plan Note (Addendum)
Chronic obstructive lung disease golds stage III with associated allergic rhinitis Plan Trial of veramyst nasal Maintain inhaled medicines as prescribed Flu vaccine

## 2011-01-07 ENCOUNTER — Telehealth: Payer: Self-pay | Admitting: Critical Care Medicine

## 2011-01-07 NOTE — Telephone Encounter (Signed)
ATC pt again and received same message. Wcb

## 2011-01-07 NOTE — Telephone Encounter (Signed)
ATC pt and was advised the wireless caller i was trying to reach was not available at this time and was not able to leave vm. wcb

## 2011-01-09 ENCOUNTER — Telehealth: Payer: Self-pay | Admitting: *Deleted

## 2011-01-09 NOTE — Telephone Encounter (Signed)
I spoke with trey from pharmacy and he states he had a note to call and request an alternative for the pt nexium. He states her copay is $25 per month so he is not sure if it is a financial issue or what. I advised I will call the pt to get more information. [name], CMA   I LMTCBx1 with the pt to discuss issue with nexium? [name], CMA

## 2011-01-09 NOTE — Telephone Encounter (Signed)
We received a call from pt pharmacy stating they had a note to call and ask for an alternative for pt nexium. I LMTCBx1 with the pt to get more information. [name], CMA

## 2011-01-09 NOTE — Telephone Encounter (Signed)
Called pt at home # and spoke with her.  Pt states she has previously been given an acid reflux med by PW and requested this be called into pharmacy.  Nexium is on pt's current med list but pt states it wasn't for nexium.  Reviewed pt's chart and per OV note from 05/20/09, patient instructions were to stop Nexium and start Dexilant qd.  Pt requesting rx for Dexilant. Pt aware PW out of office today and is ok to wait until next week for a response from [facility].  Please advise if ok to send rx or not.  Thanks.  Allergies  Allergen Reactions  . Prednisone     REACTION: agitation, insomina, mood change

## 2011-01-09 NOTE — Telephone Encounter (Signed)
ATC pt.

## 2011-01-09 NOTE — Telephone Encounter (Signed)
This is a duplicate message, see message from 01-07-11. [name], CMA

## 2011-01-09 NOTE — Telephone Encounter (Signed)
Ok for refill? 

## 2011-01-09 NOTE — Telephone Encounter (Signed)
Pt returning call to triage [name]  °

## 2011-01-12 MED ORDER — DEXLANSOPRAZOLE 60 MG PO CPDR: 60.0000 mg | DELAYED_RELEASE_CAPSULE | Freq: Every day | ORAL | Status: DC

## 2011-01-12 NOTE — Telephone Encounter (Signed)
Addended by: Michel Bickers A on: 01/12/2011 08:56 AM   Modules accepted: Orders

## 2011-01-12 NOTE — Telephone Encounter (Signed)
Patient is aware rx for Dexilant has been sent to her pharmacy.

## 2011-01-30 ENCOUNTER — Telehealth: Payer: Self-pay | Admitting: Critical Care Medicine

## 2011-01-30 NOTE — Telephone Encounter (Signed)
Dexilant PA APPROVED from 01/30/11 to 01/29/2012 through [facility] [employer] at [phone]. <e,ber ID # [id number]. Pt has tried and failed Nexium, Omeprazole. Pharmacy and patient notified.

## 2011-02-03 ENCOUNTER — Other Ambulatory Visit: Payer: Self-pay | Admitting: Allergy

## 2011-02-03 MED ORDER — NYSTATIN 100000 UNIT/ML MT SUSP: 500000.0000 [IU] | Freq: Three times a day (TID) | OROMUCOSAL | Status: DC | PRN

## 2011-02-24 ENCOUNTER — Telehealth: Payer: Self-pay | Admitting: Critical Care Medicine

## 2011-02-24 MED ORDER — NYSTATIN 100000 UNIT/ML MT SUSP: 500000.0000 [IU] | Freq: Three times a day (TID) | OROMUCOSAL | Status: DC | PRN

## 2011-02-24 NOTE — Telephone Encounter (Signed)
Pt says her last refill for nystatin was less than normal and she uses this often due to Symbicort use. Refill sent to her pharmacy for a larger quantity and pt is aware.

## 2011-03-20 ENCOUNTER — Telehealth: Payer: Self-pay | Admitting: Critical Care Medicine

## 2011-03-20 MED ORDER — IPRATROPIUM BROMIDE 0.02 % IN SOLN: RESPIRATORY_TRACT | Status: DC

## 2011-03-20 MED ORDER — ALBUTEROL SULFATE (2.5 MG/3ML) 0.083% IN NEBU
INHALATION_SOLUTION | RESPIRATORY_TRACT | Status: DC
Start: 2011-03-20 — End: 2011-03-23

## 2011-03-20 NOTE — Telephone Encounter (Signed)
RX sent electronically 

## 2011-03-23 ENCOUNTER — Other Ambulatory Visit: Payer: Self-pay | Admitting: Critical Care Medicine

## 2011-03-23 ENCOUNTER — Other Ambulatory Visit: Payer: Self-pay | Admitting: *Deleted

## 2011-03-23 MED ORDER — ALBUTEROL SULFATE (2.5 MG/3ML) 0.083% IN NEBU: INHALATION_SOLUTION | RESPIRATORY_TRACT | Status: DC

## 2011-03-23 NOTE — Telephone Encounter (Signed)
RX resent to [facility] for Albuterol nebs.

## 2011-03-24 ENCOUNTER — Ambulatory Visit (INDEPENDENT_AMBULATORY_CARE_PROVIDER_SITE_OTHER): Payer: Medicare Other | Admitting: Critical Care Medicine

## 2011-03-24 ENCOUNTER — Encounter: Payer: Self-pay | Admitting: Critical Care Medicine

## 2011-03-24 VITALS — BP 150/82 | HR 73 | Temp 98.4°F | Ht 64.0 in | Wt 170.0 lb

## 2011-03-24 DIAGNOSIS — J449 Chronic obstructive pulmonary disease, unspecified: Secondary | ICD-10-CM

## 2011-03-24 MED ORDER — AMOXICILLIN-POT CLAVULANATE 875-125 MG PO TABS: 1.0000 | ORAL_TABLET | Freq: Two times a day (BID) | ORAL | Status: AC

## 2011-03-24 MED ORDER — CETIRIZINE HCL 10 MG PO TABS: ORAL_TABLET | ORAL | Status: DC

## 2011-03-24 NOTE — Patient Instructions (Signed)
Use Zetonna sample  one puff each nostril daily in place of veramyst USe Symbicort 80 sample  in place of 160 two puff twice daily  Augmentin 875 twice daily for 10days Stop zyrtec for 10days then resume Use saline rinse in both nostrils twice daily for 10days No other changes Return 1 month

## 2011-03-24 NOTE — Progress Notes (Signed)
Subjective:    Patient ID: Donnita Falls, female    DOB: 11/17/1940, 71 y.o.   MRN: [id number]  HPI  This is a 71 y.o.  WF with hx of asthmatic bronchitis        2/5 Now notes more bleeding from nose. Symptoms for one week, sneezing noted.  Then with pndrip, mucus was clear at first .  Then worse, T101.1   Borrowed sons amoxicillin 875mg .  Took for two days. Helped partially.  Still with nasal discharge.  Sl dyspnea is noted.  Notes some productive cough with sl green, thick and white.  Past Medical History  Diagnosis Date  . Acute bronchitis   . Unspecified essential hypertension   . Esophageal reflux   . Chronic airway obstruction, not elsewhere classified      Family History  Problem Relation Age of Onset  . Heart disease       History   Social History  . Marital Status: Widowed    Spouse Name: N/A    Number of Children: N/A  . Years of Education: N/A   Occupational History  . Retired    Social History Main Topics  . Smoking status: Former Smoker -- 1.5 packs/day for 30 years    Types: Cigarettes    Quit date: 02/16/2001  . Smokeless tobacco: Never Used   Comment: 2ppd x 30 years  . Alcohol Use: Not on file  . Drug Use: Not on file  . Sexually Active: Not on file   Other Topics Concern  . Not on file   Social History Narrative  . No narrative on file     Allergies  Allergen Reactions  . Prednisone     REACTION: agitation, insomina, mood change     Outpatient Prescriptions Prior to Visit  Medication Sig Dispense Refill  . albuterol (PROAIR HFA) 108 (90 BASE) MCG/ACT inhaler Inhale 2 puffs into the lungs every 6 (six) hours as needed.        Marland Kitchen albuterol (PROVENTIL) (2.5 MG/3ML) 0.083% nebulizer solution Use 1 vial in nebulizer twice daily and as needed  DX:  496 FILE MCR PART B  360 mL  5  . amLODipine (NORVASC) 5 MG tablet Take 5 mg by mouth daily.        . budesonide-formoterol (SYMBICORT) 160-4.5 MCG/ACT inhaler Inhale 2 puffs into the lungs 2  (two) times daily.  1 Inhaler  6  . chlorpheniramine-HYDROcodone (TUSSIONEX PENNKINETIC ER) 10-8 MG/5ML LQCR Take 5 mLs by mouth every 12 (twelve) hours as needed.        . Coenzyme Q10 (CO Q 10) 10 MG CAPS Take 1 capsule by mouth daily.        Marland Kitchen dextromethorphan-guaiFENesin (MUCINEX DM) 30-600 MG per 12 hr tablet Take 1 tablet by mouth every 12 (twelve) hours as needed.       . diclofenac (VOLTAREN) 75 MG EC tablet Take 75 mg by mouth 2 (two) times daily.        Marland Kitchen ipratropium (ATROVENT) 0.02 % nebulizer solution Use 1 vial in nebulizer twice daily and as needed   DX:  496  FILE MCR PART B  360 mL  5  . Magnesium 400 MG CAPS Take 1 capsule by mouth daily.        . Misc Natural Products (OSTEO BI-FLEX TRIPLE STRENGTH PO) Take 1 tablet by mouth daily.        Marland Kitchen nystatin (MYCOSTATIN) 100000 UNIT/ML suspension Take 5 mLs (500,000 Units total) by mouth  3 (three) times daily as needed.  240 mL  1  . Omega-3 Fatty Acids (FISH OIL) 1200 MG CAPS Take 1 capsule by mouth daily.        . traMADol (ULTRAM) 50 MG tablet Take 50 mg by mouth every 6 (six) hours as needed.        . traZODone (DESYREL) 50 MG tablet Take 25 mg by mouth at bedtime as needed.       . zolpidem (AMBIEN) 10 MG tablet Take 10 mg by mouth at bedtime as needed.        . cetirizine (ZYRTEC) 10 MG tablet Take 10 mg by mouth daily.        . fluticasone (VERAMYST) 27.5 MCG/SPRAY nasal spray Place 2 sprays into the nose daily.  10 g  1      Review of Systems  Constitutional:   No  weight loss, night sweats,  Fevers, chills, fatigue, lassitude. HEENT:   No headaches,  Difficulty swallowing,  Tooth/dental problems,  Sore throat,                No sneezing, itching, ear ache,  +nasal congestion, post nasal drip,   CV:  No chest pain,  Orthopnea, PND, swelling in lower extremities, anasarca, dizziness, palpitations  GI  No heartburn, indigestion, abdominal pain, nausea, vomiting, diarrhea, change in bowel habits, loss of appetite  Resp:  ,   No coughing up of blood.     No chest wall deformity  Skin: no rash or lesions.  GU: no dysuria, change in color of urine, no urgency or frequency.  No flank pain.  MS:  No joint pain or swelling.  No decreased range of motion.  No back pain.  Psych:  No change in mood or affect. No depression or anxiety.  No memory loss.     Objective:   Physical Exam  Filed Vitals:   03/24/11 1607  BP: 150/82  Pulse: 73  Temp: 98.4 F (36.9 C)  TempSrc: Oral  Height: 5\' 4"  (1.626 m)  Weight: 170 lb (77.111 kg)  SpO2: 91%    Gen: Pleasant, well-nourished, in no distress,  normal affect  ENT: No lesions,  mouth clear,  oropharynx clear, +++  postnasal drip, nasal purulence  Neck: No JVD, no TMG, no carotid bruits  Lungs: No use of accessory muscles, no dullness to percussion, distant BS,   Cardiovascular: RRR, heart sounds normal, no murmur or gallops, no peripheral edema  Abdomen: soft and NT, no HSM,  BS normal  Musculoskeletal: No deformities, no cyanosis or clubbing  Neuro: alert, non focal  Skin: Warm, no lesions or rashes        Assessment & Plan:   COPD Chronic obstructive lung disease golds stage III with associated allergic rhinitis and now  Flare with sinusitis Plan Use Zetonna sample  one puff each nostril daily in place of veramyst USe Symbicort 80 sample  in place of 160 two puff twice daily  Augmentin 875 twice daily for 10days Stop zyrtec for 10days then resume Use saline rinse in both nostrils twice daily for 10days No other changes Return 1 month      Updated Medication List Outpatient Encounter Prescriptions as of 03/24/2011  Medication Sig Dispense Refill  . albuterol (PROAIR HFA) 108 (90 BASE) MCG/ACT inhaler Inhale 2 puffs into the lungs every 6 (six) hours as needed.        Marland Kitchen albuterol (PROVENTIL) (2.5 MG/3ML) 0.083% nebulizer solution Use 1 vial in  nebulizer twice daily and as needed  DX:  496 FILE MCR PART B  360 mL  5  . amLODipine  (NORVASC) 5 MG tablet Take 5 mg by mouth daily.        . budesonide-formoterol (SYMBICORT) 160-4.5 MCG/ACT inhaler Inhale 2 puffs into the lungs 2 (two) times daily.  1 Inhaler  6  . cetirizine (ZYRTEC) 10 MG tablet HOLD for 10days then resume      . chlorpheniramine-HYDROcodone (TUSSIONEX PENNKINETIC ER) 10-8 MG/5ML LQCR Take 5 mLs by mouth every 12 (twelve) hours as needed.        . Coenzyme Q10 (CO Q 10) 10 MG CAPS Take 1 capsule by mouth daily.        Marland Kitchen dexlansoprazole (DEXILANT) 60 MG capsule Take 60 mg by mouth daily as needed. Stops nexium while taking dexilant      . dextromethorphan-guaiFENesin (MUCINEX DM) 30-600 MG per 12 hr tablet Take 1 tablet by mouth every 12 (twelve) hours as needed.       . diclofenac (VOLTAREN) 75 MG EC tablet Take 75 mg by mouth 2 (two) times daily.        . fluticasone (VERAMYST) 27.5 MCG/SPRAY nasal spray Place 2 sprays into the nose daily as needed.      Marland Kitchen ipratropium (ATROVENT) 0.02 % nebulizer solution Use 1 vial in nebulizer twice daily and as needed   DX:  496  FILE MCR PART B  360 mL  5  . Magnesium 400 MG CAPS Take 1 capsule by mouth daily.        . Misc Natural Products (OSTEO BI-FLEX TRIPLE STRENGTH PO) Take 1 tablet by mouth daily.        Marland Kitchen NEXIUM 40 MG capsule Take 1 capsule by mouth Daily.      Marland Kitchen nystatin (MYCOSTATIN) 100000 UNIT/ML suspension Take 5 mLs (500,000 Units total) by mouth 3 (three) times daily as needed.  240 mL  1  . Omega-3 Fatty Acids (FISH OIL) 1200 MG CAPS Take 1 capsule by mouth daily.        . traMADol (ULTRAM) 50 MG tablet Take 50 mg by mouth every 6 (six) hours as needed.        . traZODone (DESYREL) 50 MG tablet Take 25 mg by mouth at bedtime as needed.       . zolpidem (AMBIEN) 10 MG tablet Take 10 mg by mouth at bedtime as needed.        Marland Kitchen DISCONTD: cetirizine (ZYRTEC) 10 MG tablet Take 10 mg by mouth daily.        Marland Kitchen DISCONTD: fluticasone (VERAMYST) 27.5 MCG/SPRAY nasal spray Place 2 sprays into the nose daily.  10 g  1  .  amoxicillin-clavulanate (AUGMENTIN) 875-125 MG per tablet Take 1 tablet by mouth 2 (two) times daily.  20 tablet  0

## 2011-03-25 NOTE — Assessment & Plan Note (Signed)
Chronic obstructive lung disease golds stage III with associated allergic rhinitis and now  Flare with sinusitis Plan Use Zetonna sample  one puff each nostril daily in place of veramyst USe Symbicort 80 sample  in place of 160 two puff twice daily  Augmentin 875 twice daily for 10days Stop zyrtec for 10days then resume Use saline rinse in both nostrils twice daily for 10days No other changes Return 1 month

## 2011-04-28 ENCOUNTER — Encounter: Payer: Self-pay | Admitting: Critical Care Medicine

## 2011-04-28 ENCOUNTER — Ambulatory Visit (INDEPENDENT_AMBULATORY_CARE_PROVIDER_SITE_OTHER): Payer: Medicare Other | Admitting: Critical Care Medicine

## 2011-04-28 VITALS — BP 150/80 | HR 71 | Temp 98.1°F | Ht 64.0 in | Wt 169.8 lb

## 2011-04-28 DIAGNOSIS — J449 Chronic obstructive pulmonary disease, unspecified: Secondary | ICD-10-CM

## 2011-04-28 MED ORDER — CICLESONIDE 37 MCG/ACT NA AERS: 1.0000 | INHALATION_SPRAY | Freq: Every day | NASAL | Status: DC

## 2011-04-28 NOTE — Progress Notes (Signed)
Subjective:    Patient ID: Kristin Estrada, female    DOB: 1940/12/18, 71 y.o.   MRN: [id number]  HPI  This is a 71 y.o.  WF with hx of Gold C COPD       2/5 Now notes more bleeding from nose. Symptoms for one week, sneezing noted.  Then with pndrip, mucus was clear at first .  Then worse, T101.1   Borrowed sons amoxicillin 875mg .  Took for two days. Helped partially.  Still with nasal discharge.  Sl dyspnea is noted.  Notes some productive cough with sl green, thick and white.  04/28/2011 CAT Score 15.   No fever.  Cough is ok.  Dyspnea is at baseline. Pt denies any significant sore throat, nasal congestion or excess secretions, fever, chills, sweats, unintended weight loss, pleurtic or exertional chest pain, orthopnea PND, or leg swelling Pt denies any increase in rescue therapy over baseline, denies waking up needing it or having any early am or nocturnal exacerbations of coughing/wheezing/or dyspnea. Pt also denies any obvious fluctuation in symptoms with  weather or environmental change or other alleviating or aggravating factors   Past Medical History  Diagnosis Date  . Acute bronchitis   . Unspecified essential hypertension   . Esophageal reflux   . Chronic airway obstruction, not elsewhere classified      Family History  Problem Relation Age of Onset  . Heart disease       History   Social History  . Marital Status: Widowed    Spouse Name: N/A    Number of Children: N/A  . Years of Education: N/A   Occupational History  . Retired    Social History Main Topics  . Smoking status: Former Smoker -- 2.0 packs/day for 30 years    Types: Cigarettes    Quit date: 02/16/2001  . Smokeless tobacco: Never Used   Comment: 2ppd x 30 years  . Alcohol Use: Not on file  . Drug Use: Not on file  . Sexually Active: Not on file   Other Topics Concern  . Not on file   Social History Narrative  . No narrative on file     Allergies  Allergen Reactions  . Prednisone       REACTION: agitation, insomina, mood change     Outpatient Prescriptions Prior to Visit  Medication Sig Dispense Refill  . albuterol (PROAIR HFA) 108 (90 BASE) MCG/ACT inhaler Inhale 2 puffs into the lungs every 6 (six) hours as needed.        Marland Kitchen albuterol (PROVENTIL) (2.5 MG/3ML) 0.083% nebulizer solution Use 1 vial in nebulizer twice daily and as needed  DX:  496 FILE MCR PART B  360 mL  5  . amLODipine (NORVASC) 5 MG tablet Take 5 mg by mouth daily.        . budesonide-formoterol (SYMBICORT) 160-4.5 MCG/ACT inhaler Inhale 2 puffs into the lungs 2 (two) times daily.  1 Inhaler  6  . chlorpheniramine-HYDROcodone (TUSSIONEX PENNKINETIC ER) 10-8 MG/5ML LQCR Take 5 mLs by mouth every 12 (twelve) hours as needed.        . Coenzyme Q10 (CO Q 10) 10 MG CAPS Take 1 capsule by mouth daily.        Marland Kitchen dexlansoprazole (DEXILANT) 60 MG capsule Take 60 mg by mouth daily as needed. Stops nexium while taking dexilant      . dextromethorphan-guaiFENesin (MUCINEX DM) 30-600 MG per 12 hr tablet Take 1 tablet by mouth every 12 (twelve) hours as needed.       Marland Kitchen  diclofenac (VOLTAREN) 75 MG EC tablet Take 75 mg by mouth 2 (two) times daily.        Marland Kitchen ipratropium (ATROVENT) 0.02 % nebulizer solution Use 1 vial in nebulizer twice daily and as needed   DX:  496  FILE MCR PART B  360 mL  5  . Magnesium 400 MG CAPS Take 1 capsule by mouth daily.        . Misc Natural Products (OSTEO BI-FLEX TRIPLE STRENGTH PO) Take 1 tablet by mouth daily.        Marland Kitchen NEXIUM 40 MG capsule Take 1 capsule by mouth Daily.      Marland Kitchen nystatin (MYCOSTATIN) 100000 UNIT/ML suspension Take 5 mLs (500,000 Units total) by mouth 3 (three) times daily as needed.  240 mL  1  . Omega-3 Fatty Acids (FISH OIL) 1200 MG CAPS Take 1 capsule by mouth daily.        . traMADol (ULTRAM) 50 MG tablet Take 50 mg by mouth every 6 (six) hours as needed.        . traZODone (DESYREL) 50 MG tablet Take 25 mg by mouth at bedtime as needed.       . zolpidem (AMBIEN) 10 MG  tablet Take 10 mg by mouth at bedtime as needed.        . cetirizine (ZYRTEC) 10 MG tablet HOLD for 10days then resume      . fluticasone (VERAMYST) 27.5 MCG/SPRAY nasal spray Place 2 sprays into the nose daily as needed.          Review of Systems  Constitutional:   No  weight loss, night sweats,  Fevers, chills, fatigue, lassitude. HEENT:   No headaches,  Difficulty swallowing,  Tooth/dental problems,  Sore throat,                No sneezing, itching, ear ache,  +nasal congestion, post nasal drip,   CV:  No chest pain,  Orthopnea, PND, swelling in lower extremities, anasarca, dizziness, palpitations  GI  No heartburn, indigestion, abdominal pain, nausea, vomiting, diarrhea, change in bowel habits, loss of appetite  Resp:  ,  No coughing up of blood.     No chest wall deformity  Skin: no rash or lesions.  GU: no dysuria, change in color of urine, no urgency or frequency.  No flank pain.  MS:  No joint pain or swelling.  No decreased range of motion.  No back pain.  Psych:  No change in mood or affect. No depression or anxiety.  No memory loss.     Objective:   Physical Exam  Filed Vitals:   04/28/11 1022  BP: 150/80  Pulse: 71  Temp: 98.1 F (36.7 C)  TempSrc: Oral  Height: 5\' 4"  (1.626 m)  Weight: 169 lb 12.8 oz (77.021 kg)  SpO2: 94%    Gen: Pleasant, well-nourished, in no distress,  normal affect  ENT: No lesions,  mouth clear,  oropharynx clear, no  postnasal drip Neck: No JVD, no TMG, no carotid bruits  Lungs: No use of accessory muscles, no dullness to percussion, distant BS,   Cardiovascular: RRR, heart sounds normal, no murmur or gallops, no peripheral edema  Abdomen: soft and NT, no HSM,  BS normal  Musculoskeletal: No deformities, no cyanosis or clubbing  Neuro: alert, non focal  Skin: Warm, no lesions or rashes        Assessment & Plan:   COPD Copd Golds Stage C CAT 15 04/28/2011 Stable at this time  Plan No change in inhaled or  maintenance medications. Return in  3 months Refill Zetonna for allergic rhinitis     Updated Medication List Outpatient Encounter Prescriptions as of 04/28/2011  Medication Sig Dispense Refill  . albuterol (PROAIR HFA) 108 (90 BASE) MCG/ACT inhaler Inhale 2 puffs into the lungs every 6 (six) hours as needed.        Marland Kitchen albuterol (PROVENTIL) (2.5 MG/3ML) 0.083% nebulizer solution Use 1 vial in nebulizer twice daily and as needed  DX:  496 FILE MCR PART B  360 mL  5  . amLODipine (NORVASC) 5 MG tablet Take 5 mg by mouth daily.        . budesonide-formoterol (SYMBICORT) 160-4.5 MCG/ACT inhaler Inhale 2 puffs into the lungs 2 (two) times daily.  1 Inhaler  6  . cetirizine (ZYRTEC) 10 MG tablet Take 10 mg by mouth daily.      . chlorpheniramine-HYDROcodone (TUSSIONEX PENNKINETIC ER) 10-8 MG/5ML LQCR Take 5 mLs by mouth every 12 (twelve) hours as needed.        . Coenzyme Q10 (CO Q 10) 10 MG CAPS Take 1 capsule by mouth daily.        Marland Kitchen dexlansoprazole (DEXILANT) 60 MG capsule Take 60 mg by mouth daily as needed. Stops nexium while taking dexilant      . dextromethorphan-guaiFENesin (MUCINEX DM) 30-600 MG per 12 hr tablet Take 1 tablet by mouth every 12 (twelve) hours as needed.       . diclofenac (VOLTAREN) 75 MG EC tablet Take 75 mg by mouth 2 (two) times daily.        Marland Kitchen ipratropium (ATROVENT) 0.02 % nebulizer solution Use 1 vial in nebulizer twice daily and as needed   DX:  496  FILE MCR PART B  360 mL  5  . Magnesium 400 MG CAPS Take 1 capsule by mouth daily.        . Misc Natural Products (OSTEO BI-FLEX TRIPLE STRENGTH PO) Take 1 tablet by mouth daily.        Marland Kitchen NEXIUM 40 MG capsule Take 1 capsule by mouth Daily.      Marland Kitchen nystatin (MYCOSTATIN) 100000 UNIT/ML suspension Take 5 mLs (500,000 Units total) by mouth 3 (three) times daily as needed.  240 mL  1  . Omega-3 Fatty Acids (FISH OIL) 1200 MG CAPS Take 1 capsule by mouth daily.        . traMADol (ULTRAM) 50 MG tablet Take 50 mg by mouth every 6  (six) hours as needed.        . traZODone (DESYREL) 50 MG tablet Take 25 mg by mouth at bedtime as needed.       . zolpidem (AMBIEN) 10 MG tablet Take 10 mg by mouth at bedtime as needed.        Marland Kitchen DISCONTD: cetirizine (ZYRTEC) 10 MG tablet HOLD for 10days then resume      . DISCONTD: fluticasone (VERAMYST) 27.5 MCG/SPRAY nasal spray Place 2 sprays into the nose daily as needed.      . Ciclesonide (ZETONNA) 37 MCG/ACT AERS Place 1 puff into the nose daily.  1 Inhaler  1

## 2011-04-28 NOTE — Patient Instructions (Signed)
Use Zetonna one puff daily each nostril No other medication changes Return 2 months

## 2011-04-28 NOTE — Assessment & Plan Note (Signed)
Copd Golds Stage C CAT 15 04/28/2011 Stable at this time Plan No change in inhaled or maintenance medications. Return in  3 months Refill Zetonna for allergic rhinitis

## 2011-06-18 ENCOUNTER — Telehealth: Payer: Self-pay | Admitting: Critical Care Medicine

## 2011-06-18 NOTE — Telephone Encounter (Signed)
Received PA form from [facility] for dexilant Called [phone] to initiate PA Pt has tried and failed omeprazole and nexium  Was advised that there is already authorization is already in place for this med and that the pharmacy just needs to re-run the rx and this will go through. I faxed this back to the pharm with these instructions.

## 2011-06-26 ENCOUNTER — Encounter: Payer: Self-pay | Admitting: Critical Care Medicine

## 2011-06-26 ENCOUNTER — Ambulatory Visit (INDEPENDENT_AMBULATORY_CARE_PROVIDER_SITE_OTHER): Payer: Medicare Other | Admitting: Critical Care Medicine

## 2011-06-26 VITALS — BP 134/80 | HR 75 | Temp 97.7°F | Ht 64.0 in | Wt 165.0 lb

## 2011-06-26 DIAGNOSIS — J449 Chronic obstructive pulmonary disease, unspecified: Secondary | ICD-10-CM

## 2011-06-26 MED ORDER — TIOTROPIUM BROMIDE MONOHYDRATE 18 MCG IN CAPS: 18.0000 ug | ORAL_CAPSULE | Freq: Every day | RESPIRATORY_TRACT | Status: DC

## 2011-06-26 MED ORDER — HYDROCOD POLST-CHLORPHEN POLST 10-8 MG/5ML PO LQCR: 5.0000 mL | Freq: Two times a day (BID) | ORAL | Status: DC | PRN

## 2011-06-26 NOTE — Progress Notes (Signed)
Subjective:    Patient ID: Kristin Estrada, female    DOB: 05-10-1940, 71 y.o.   MRN: [id number]  HPI  This is a 71 y.o.  WF with hx of Gold C COPD      06/26/2011 Trouble with symbicort.  Mouth sores.  Now is coughing more.  Not using symbicort d/t mouth issues. Dyspnea is worse and coughing more thick mucus.  Side effects from  Doxycycline. PCP rx this for bronchitis first week of April.    Past Medical History  Diagnosis Date  . Acute bronchitis   . Unspecified essential hypertension   . Esophageal reflux   . Chronic airway obstruction, not elsewhere classified      Family History  Problem Relation Age of Onset  . Heart disease       History   Social History  . Marital Status: Widowed    Spouse Name: N/A    Number of Children: N/A  . Years of Education: N/A   Occupational History  . Retired    Social History Main Topics  . Smoking status: Former Smoker -- 2.0 packs/day for 30 years    Types: Cigarettes    Quit date: 02/16/2001  . Smokeless tobacco: Never Used   Comment: 2ppd x 30 years  . Alcohol Use: Not on file  . Drug Use: Not on file  . Sexually Active: Not on file   Other Topics Concern  . Not on file   Social History Narrative  . No narrative on file     Allergies  Allergen Reactions  . Prednisone     REACTION: agitation, insomina, mood change     Outpatient Prescriptions Prior to Visit  Medication Sig Dispense Refill  . albuterol (PROAIR HFA) 108 (90 BASE) MCG/ACT inhaler Inhale 2 puffs into the lungs every 6 (six) hours as needed.        Marland Kitchen albuterol (PROVENTIL) (2.5 MG/3ML) 0.083% nebulizer solution Use 1 vial in nebulizer twice daily and as needed  DX:  496 FILE MCR PART B  360 mL  5  . amLODipine (NORVASC) 5 MG tablet Take 5 mg by mouth daily.        . cetirizine (ZYRTEC) 10 MG tablet Take 10 mg by mouth daily.      . Ciclesonide (ZETONNA) 37 MCG/ACT AERS Place 1 puff into the nose daily.  1 Inhaler  1  . Coenzyme Q10 (CO Q 10) 10 MG  CAPS Take 1 capsule by mouth daily.        Marland Kitchen dextromethorphan-guaiFENesin (MUCINEX DM) 30-600 MG per 12 hr tablet Take 1 tablet by mouth every 12 (twelve) hours as needed.       . diclofenac (VOLTAREN) 75 MG EC tablet Take 75 mg by mouth 2 (two) times daily.        Marland Kitchen ipratropium (ATROVENT) 0.02 % nebulizer solution Use 1 vial in nebulizer twice daily and as needed   DX:  496  FILE MCR PART B  360 mL  5  . Magnesium 400 MG CAPS Take 1 capsule by mouth daily.        . Misc Natural Products (OSTEO BI-FLEX TRIPLE STRENGTH PO) Take 1 tablet by mouth daily.        Marland Kitchen NEXIUM 40 MG capsule Take 1 capsule by mouth Daily.      Marland Kitchen nystatin (MYCOSTATIN) 100000 UNIT/ML suspension Take 5 mLs (500,000 Units total) by mouth 3 (three) times daily as needed.  240 mL  1  . traMADol (  ULTRAM) 50 MG tablet Take 50 mg by mouth every 6 (six) hours as needed.        . traZODone (DESYREL) 50 MG tablet Take 25 mg by mouth at bedtime as needed.       . zolpidem (AMBIEN) 10 MG tablet Take 10 mg by mouth at bedtime as needed.        . Omega-3 Fatty Acids (FISH OIL) 1200 MG CAPS Take 1 capsule by mouth daily.        Marland Kitchen dexlansoprazole (DEXILANT) 60 MG capsule Take 1 capsule (60 mg total) by mouth daily.  30 capsule  5  . dexlansoprazole (DEXILANT) 60 MG capsule Take 60 mg by mouth daily as needed. Stops nexium while taking dexilant      . budesonide-formoterol (SYMBICORT) 160-4.5 MCG/ACT inhaler Inhale 2 puffs into the lungs 2 (two) times daily.  1 Inhaler  6  . chlorpheniramine-HYDROcodone (TUSSIONEX PENNKINETIC ER) 10-8 MG/5ML LQCR Take 5 mLs by mouth every 12 (twelve) hours as needed.            Review of Systems  Constitutional:   No  weight loss, night sweats,  Fevers, chills, fatigue, lassitude. HEENT:   No headaches,  Difficulty swallowing,  Tooth/dental problems,  Sore throat,                No sneezing, itching, ear ache,  +nasal congestion, post nasal drip,   CV:  No chest pain,  Orthopnea, PND, swelling in lower  extremities, anasarca, dizziness, palpitations  GI  No heartburn, indigestion, abdominal pain, nausea, vomiting, diarrhea, change in bowel habits, loss of appetite  Resp:  ,  No coughing up of blood.     No chest wall deformity  Skin: no rash or lesions.  GU: no dysuria, change in color of urine, no urgency or frequency.  No flank pain.  MS:  No joint pain or swelling.  No decreased range of motion.  No back pain.  Psych:  No change in mood or affect. No depression or anxiety.  No memory loss.     Objective:   Physical Exam  Filed Vitals:   06/26/11 1345  BP: 134/80  Pulse: 75  Temp: 97.7 F (36.5 C)  TempSrc: Oral  Height: 5\' 4"  (1.626 m)  Weight: 165 lb (74.844 kg)  SpO2: 93%    Gen: Pleasant, well-nourished, in no distress,  normal affect  ENT: No lesions,  mouth clear,  oropharynx clear, no  postnasal drip Neck: No JVD, no TMG, no carotid bruits  Lungs: No use of accessory muscles, no dullness to percussion, distant BS,   Cardiovascular: RRR, heart sounds normal, no murmur or gallops, no peripheral edema  Abdomen: soft and NT, no HSM,  BS normal  Musculoskeletal: No deformities, no cyanosis or clubbing  Neuro: alert, non focal  Skin: Warm, no lesions or rashes        Assessment & Plan:   COPD Chronic obstructive lung disease with asthmatic bronchitic component and intolerance to Symbicort. Plan Discontinue Symbicort Begin Spiriva      Updated Medication List Outpatient Encounter Prescriptions as of 06/26/2011  Medication Sig Dispense Refill  . albuterol (PROAIR HFA) 108 (90 BASE) MCG/ACT inhaler Inhale 2 puffs into the lungs every 6 (six) hours as needed.        Marland Kitchen albuterol (PROVENTIL) (2.5 MG/3ML) 0.083% nebulizer solution Use 1 vial in nebulizer twice daily and as needed  DX:  496 FILE MCR PART B  360 mL  5  .  amLODipine (NORVASC) 5 MG tablet Take 5 mg by mouth daily.        . cetirizine (ZYRTEC) 10 MG tablet Take 10 mg by mouth daily.        . Ciclesonide (ZETONNA) 37 MCG/ACT AERS Place 1 puff into the nose daily.  1 Inhaler  1  . Coenzyme Q10 (CO Q 10) 10 MG CAPS Take 1 capsule by mouth daily.        Marland Kitchen dextromethorphan-guaiFENesin (MUCINEX DM) 30-600 MG per 12 hr tablet Take 1 tablet by mouth every 12 (twelve) hours as needed.       . diclofenac (VOLTAREN) 75 MG EC tablet Take 75 mg by mouth 2 (two) times daily.        Marland Kitchen ipratropium (ATROVENT) 0.02 % nebulizer solution Use 1 vial in nebulizer twice daily and as needed   DX:  496  FILE MCR PART B  360 mL  5  . Magnesium 400 MG CAPS Take 1 capsule by mouth daily.        . Misc Natural Products (OSTEO BI-FLEX TRIPLE STRENGTH PO) Take 1 tablet by mouth daily.        Marland Kitchen NEXIUM 40 MG capsule Take 1 capsule by mouth Daily.      Marland Kitchen nystatin (MYCOSTATIN) 100000 UNIT/ML suspension Take 5 mLs (500,000 Units total) by mouth 3 (three) times daily as needed.  240 mL  1  . traMADol (ULTRAM) 50 MG tablet Take 50 mg by mouth every 6 (six) hours as needed.        . traZODone (DESYREL) 50 MG tablet Take 25 mg by mouth at bedtime as needed.       . zolpidem (AMBIEN) 10 MG tablet Take 10 mg by mouth at bedtime as needed.        Marland Kitchen DISCONTD: Omega-3 Fatty Acids (FISH OIL) 1200 MG CAPS Take 1 capsule by mouth daily.        . chlorpheniramine-HYDROcodone (TUSSIONEX PENNKINETIC ER) 10-8 MG/5ML LQCR Take 5 mLs by mouth every 12 (twelve) hours as needed.  140 mL  2  . dexlansoprazole (DEXILANT) 60 MG capsule Take 1 capsule (60 mg total) by mouth daily.  30 capsule  5  . dexlansoprazole (DEXILANT) 60 MG capsule Take 60 mg by mouth daily as needed. Stops nexium while taking dexilant      . tiotropium (SPIRIVA HANDIHALER) 18 MCG inhalation capsule Place 1 capsule (18 mcg total) into inhaler and inhale daily.  30 capsule  11  . DISCONTD: budesonide-formoterol (SYMBICORT) 160-4.5 MCG/ACT inhaler Inhale 2 puffs into the lungs 2 (two) times daily.  1 Inhaler  6  . DISCONTD: chlorpheniramine-HYDROcodone (TUSSIONEX  PENNKINETIC ER) 10-8 MG/5ML LQCR Take 5 mLs by mouth every 12 (twelve) hours as needed.

## 2011-06-26 NOTE — Patient Instructions (Signed)
Use Zetonna samples Stop symbicort Start Spiriva daily Return 3 months

## 2011-06-26 NOTE — Assessment & Plan Note (Signed)
Chronic obstructive lung disease with asthmatic bronchitic component and intolerance to Symbicort. Plan Discontinue Symbicort Begin Spiriva

## 2011-06-29 ENCOUNTER — Telehealth: Payer: Self-pay | Admitting: Critical Care Medicine

## 2011-06-29 MED ORDER — MOXIFLOXACIN HCL 400 MG PO TABS: 400.0000 mg | ORAL_TABLET | Freq: Every day | ORAL | Status: AC

## 2011-06-29 NOTE — Telephone Encounter (Signed)
Call in avelox 400mg  daily for 5days

## 2011-06-29 NOTE — Telephone Encounter (Signed)
I spoke with pt and is aware of PW recs. Rx has been sent to the pharmacy

## 2011-06-29 NOTE — Telephone Encounter (Signed)
I spoke with pt and she states she thought she was under the impression an abx was going to be called in for her on Friday when she saw PW. I looked in OV note and did not see any mention of this. Pt is c/o couging w/ green phlem and has been running a temp off and on x Friday of 99.8-102, and "bronchial tubes feel raw". Pt has been taking tylenol for this. She has not filled her tussionex cough syrup yet. Pt is requesting something to be called in for this. Please advise Dr. Delford Field, thanks  Allergies  Allergen Reactions  . Prednisone     REACTION: agitation, insomina, mood change      [facility]

## 2011-07-03 ENCOUNTER — Encounter: Payer: Self-pay | Admitting: Internal Medicine

## 2011-07-03 ENCOUNTER — Ambulatory Visit (INDEPENDENT_AMBULATORY_CARE_PROVIDER_SITE_OTHER): Payer: Medicare Other | Admitting: Internal Medicine

## 2011-07-03 ENCOUNTER — Telehealth: Payer: Self-pay | Admitting: Internal Medicine

## 2011-07-03 VITALS — BP 124/80 | HR 86 | Temp 97.9°F | Ht 64.0 in | Wt 165.8 lb

## 2011-07-03 DIAGNOSIS — J449 Chronic obstructive pulmonary disease, unspecified: Secondary | ICD-10-CM

## 2011-07-03 MED ORDER — FORMOTEROL FUMARATE 20 MCG/2ML IN NEBU: 20.0000 ug | INHALATION_SOLUTION | Freq: Two times a day (BID) | RESPIRATORY_TRACT | Status: DC

## 2011-07-03 MED ORDER — METHYLPREDNISOLONE ACETATE 80 MG/ML IJ SUSP
120.0000 mg | Freq: Once | INTRAMUSCULAR | Status: AC
  Administered 2011-07-03: 120 mg via INTRAMUSCULAR

## 2011-07-03 MED ORDER — BUDESONIDE 0.25 MG/2ML IN SUSP: 0.2500 mg | Freq: Two times a day (BID) | RESPIRATORY_TRACT | Status: DC

## 2011-07-03 MED ORDER — BUDESONIDE 0.25 MG/2ML IN SUSP: 0.2500 mg | Freq: Every day | RESPIRATORY_TRACT | Status: DC

## 2011-07-03 NOTE — Telephone Encounter (Signed)
MW will advise on Monday-Kristin Estrada is aware of this.

## 2011-07-03 NOTE — Assessment & Plan Note (Signed)
Flare on spiriva and can't take symbicort or predniosne po,  also on ipatropium nebs  Gold III and difficult to control symptoms. DDX of  difficult airways managment all start with A and  include Adherence, Ace Inhibitors, Acid Reflux, Active Sinus Disease, Alpha 1 Antitripsin deficiency, Anxiety masquerading as Airways dz,  ABPA,  allergy(esp in young), Aspiration (esp in elderly), Adverse effects of DPI,  Active smokers, plus two Bs  = Bronchiectasis and Beta blocker use..and one C= CHF  Adherence is always the initial "prime suspect" and is a multilayered concern that requires a "trust but verify" approach in every patient - starting with knowing how to use medications, especially inhalers, correctly, keeping up with refills and understanding the fundamental difference between maintenance and prns vs those medications only taken for a very short course and then stopped and not refilled. May do better with bid formoterol and bud and change nebs to just using alb q 4 h prn.  ? Acid reflux > reviewed optimal timing of ppi and add H2 hs while exacerbation in case she has any noct gerd contributing to airways instability.  See instructions for specific recommendations which were reviewed directly with the patient who was given a copy with highlighter outlining the key components.

## 2011-07-03 NOTE — Progress Notes (Signed)
Subjective:    Patient ID: Donnita Falls, female    DOB: 16-May-1940, 71 y.o.   MRN: [id number]  HPI This is a 71 y.o.  WF with hx of Gold C COPD quit smoking in 2003       06/26/2011 Trouble with symbicort.  Mouth sores.  Now is coughing more.  Not using symbicort d/t mouth issues. Dyspnea is worse and coughing more thick mucus.  Side effects from  Doxycycline. PCP rx this for bronchitis first week of April. Rec Use Zetonna samples Stop symbicort Start Spiriva daily    07/03/11 Shubh Chiara/ Acute w/in visit cc abuptly  felt worse with fever / yellow mucus 5/11 then started avelox 5/13 cc  prod cough with large amount of yellow sputum and increased SOB for the past x 5 days, jittery from nebulizer this am but did help breathing. No fever, no cp or sore throat nausea or vomiting/ leg swelling. Mucus turning lighter yellow but still difficult to cough up.  Sleeping ok without nocturnal  or early am exacerbation  of respiratory  c/o's or need for noct saba. Also denies any obvious fluctuation of symptoms with weather or environmental changes or other aggravating or alleviating factors except as outlined above      Past Medical History  Diagnosis Date  . Acute bronchitis   . Unspecified essential hypertension   . Esophageal reflux   . Chronic airway obstruction, not elsewhere classified      Family History  Problem Relation Age of Onset  . Heart disease       History   Social History  . Marital Status: Widowed    Spouse Name: N/A    Number of Children: N/A  . Years of Education: N/A   Occupational History  . Retired    Social History Main Topics  . Smoking status: Former Smoker -- 2.0 packs/day for 30 years    Types: Cigarettes    Quit date: 02/16/2001  . Smokeless tobacco: Never Used   Comment: 2ppd x 30 years  . Alcohol Use: Not on file  . Drug Use: Not on file  . Sexually Active: Not on file   Other Topics Concern  . Not on file   Social History Narrative  . No  narrative on file     Allergies  Allergen Reactions  . Prednisone     REACTION: agitation, insomina, mood change     Outpatient Prescriptions Prior to Visit  Medication Sig Dispense Refill  . albuterol (PROAIR HFA) 108 (90 BASE) MCG/ACT inhaler Inhale 2 puffs into the lungs every 6 (six) hours as needed.        Marland Kitchen albuterol (PROVENTIL) (2.5 MG/3ML) 0.083% nebulizer solution Use 1 vial in nebulizer twice daily and as needed  DX:  496 FILE MCR PART B  360 mL  5  . amLODipine (NORVASC) 5 MG tablet Take 5 mg by mouth daily.        . cetirizine (ZYRTEC) 10 MG tablet Take 10 mg by mouth daily.      . chlorpheniramine-HYDROcodone (TUSSIONEX PENNKINETIC ER) 10-8 MG/5ML LQCR Take 5 mLs by mouth every 12 (twelve) hours as needed.  140 mL  2  . Ciclesonide (ZETONNA) 37 MCG/ACT AERS Place 1 puff into the nose daily.  1 Inhaler  1  . Coenzyme Q10 (CO Q 10) 10 MG CAPS Take 1 capsule by mouth daily.        Marland Kitchen dexlansoprazole (DEXILANT) 60 MG capsule Take 1 capsule (60 mg total)  by mouth daily.  30 capsule  5  . dexlansoprazole (DEXILANT) 60 MG capsule Take 60 mg by mouth daily as needed. Stops nexium while taking dexilant      . dextromethorphan-guaiFENesin (MUCINEX DM) 30-600 MG per 12 hr tablet Take 1 tablet by mouth every 12 (twelve) hours as needed.       . diclofenac (VOLTAREN) 75 MG EC tablet Take 75 mg by mouth 2 (two) times daily.        Marland Kitchen ipratropium (ATROVENT) 0.02 % nebulizer solution Use 1 vial in nebulizer twice daily and as needed   DX:  496  FILE MCR PART B  360 mL  5  . Magnesium 400 MG CAPS Take 1 capsule by mouth daily.        . Misc Natural Products (OSTEO BI-FLEX TRIPLE STRENGTH PO) Take 1 tablet by mouth daily.        Marland Kitchen moxifloxacin (AVELOX) 400 MG tablet Take 1 tablet (400 mg total) by mouth daily.  5 tablet  0  . NEXIUM 40 MG capsule Take 1 capsule by mouth Daily.      Marland Kitchen nystatin (MYCOSTATIN) 100000 UNIT/ML suspension Take 5 mLs (500,000 Units total) by mouth 3 (three) times daily  as needed.  240 mL  1  . tiotropium (SPIRIVA HANDIHALER) 18 MCG inhalation capsule Place 1 capsule (18 mcg total) into inhaler and inhale daily.  30 capsule  11  . traMADol (ULTRAM) 50 MG tablet Take 50 mg by mouth every 6 (six) hours as needed.        . traZODone (DESYREL) 50 MG tablet Take 25 mg by mouth at bedtime as needed.       . zolpidem (AMBIEN) 10 MG tablet Take 10 mg by mouth at bedtime as needed.            Review of Systems Constitutional:   No  weight loss, night sweats,  Fevers, chills, fatigue, lassitude. HEENT:   No headaches,  Difficulty swallowing,  Tooth/dental problems,  Sore throat,                No sneezing, itching, ear ache,  +nasal congestion, post nasal drip,   CV:  No chest pain,  Orthopnea, PND, swelling in lower extremities, anasarca, dizziness, palpitations  GI  No heartburn, indigestion, abdominal pain, nausea, vomiting, diarrhea, change in bowel habits, loss of appetite  Resp:  ,  No coughing up of blood.     No chest wall deformity  Skin: no rash or lesions.  GU: no dysuria, change in color of urine, no urgency or frequency.  No flank pain.  MS:  No joint pain or swelling.  No decreased range of motion.  No back pain.  Psych:  No change in mood or affect. No depression or anxiety.  No memory loss.     Objective:   Physical Exam There were no vitals filed for this visit.  Gen: mod anxious ambulatory wf nad at rest   ENT: No lesions,  mouth clear,  oropharynx clear, no  postnasal drip Neck: No JVD, no TMG, no carotid bruits  Lungs: No use of accessory muscles, no dullness to percussion, distant BS,   Cardiovascular: RRR, heart sounds normal, no murmur or gallops, no peripheral edema  Abdomen: soft and NT, no HSM,  BS normal  Musculoskeletal: No deformities, no cyanosis or clubbing  Neuro: alert, non focal  Skin: Warm, no lesions or rashes        Assessment & Plan:  No problem-specific assessment & plan notes found for this  encounter.   Updated Medication List Outpatient Encounter Prescriptions as of 07/03/2011  Medication Sig Dispense Refill  . albuterol (PROVENTIL) (2.5 MG/3ML) 0.083% nebulizer solution Use 1 vial in nebulizer twice daily and as needed  DX:  496 FILE MCR PART B  360 mL  5  . amLODipine (NORVASC) 5 MG tablet Take 5 mg by mouth daily.        . cetirizine (ZYRTEC) 10 MG tablet Take 10 mg by mouth daily.      . chlorpheniramine-HYDROcodone (TUSSIONEX PENNKINETIC ER) 10-8 MG/5ML LQCR Take 5 mLs by mouth every 12 (twelve) hours as needed.  140 mL  2  . Ciclesonide (ZETONNA) 37 MCG/ACT AERS Place 1 puff into the nose daily.  1 Inhaler  1  . Coenzyme Q10 (CO Q 10) 10 MG CAPS Take 1 capsule by mouth daily.        Marland Kitchen dexlansoprazole (DEXILANT) 60 MG capsule Take 60 mg by mouth daily as needed. Stops nexium while taking dexilant      . dextromethorphan-guaiFENesin (MUCINEX DM) 30-600 MG per 12 hr tablet Take 1 tablet by mouth every 12 (twelve) hours as needed.       . diclofenac (VOLTAREN) 75 MG EC tablet Take 75 mg by mouth 2 (two) times daily.        . Magnesium 400 MG CAPS Take 1 capsule by mouth daily.        . Misc Natural Products (OSTEO BI-FLEX TRIPLE STRENGTH PO) Take 1 tablet by mouth daily.        Marland Kitchen moxifloxacin (AVELOX) 400 MG tablet Take 1 tablet (400 mg total) by mouth daily.  5 tablet  0  . NEXIUM 40 MG capsule Take 1 capsule by mouth Daily.      Marland Kitchen nystatin (MYCOSTATIN) 100000 UNIT/ML suspension Take 5 mLs (500,000 Units total) by mouth 3 (three) times daily as needed.  240 mL  1  . tiotropium (SPIRIVA HANDIHALER) 18 MCG inhalation capsule Place 1 capsule (18 mcg total) into inhaler and inhale daily.  30 capsule  11  . traMADol (ULTRAM) 50 MG tablet Take 50 mg by mouth every 6 (six) hours as needed.        . traZODone (DESYREL) 50 MG tablet Take 25 mg by mouth at bedtime as needed.       . zolpidem (AMBIEN) 10 MG tablet Take 10 mg by mouth at bedtime as needed.        Marland Kitchen DISCONTD: ipratropium  (ATROVENT) 0.02 % nebulizer solution Use 1 vial in nebulizer twice daily and as needed   DX:  496  FILE MCR PART B  360 mL  5  . albuterol (PROAIR HFA) 108 (90 BASE) MCG/ACT inhaler Inhale 2 puffs into the lungs every 6 (six) hours as needed.        Marland Kitchen dexlansoprazole (DEXILANT) 60 MG capsule Take 1 capsule (60 mg total) by mouth daily.  30 capsule  5  . formoterol (PERFOROMIST) 20 MCG/2ML nebulizer solution Take 2 mLs (20 mcg total) by nebulization 2 (two) times daily.  2 mL  11  . DISCONTD: budesonide (PULMICORT) 0.25 MG/2ML nebulizer solution Take 2 mLs (0.25 mg total) by nebulization daily.  60 mL  12   Facility-Administered Encounter Medications as of 07/03/2011  Medication Dose Route Frequency Provider Last Rate Last Dose  . methylPREDNISolone acetate (DEPO-MEDROL) injection 120 mg  120 mg Intramuscular Once Nyoka Cowden, MD   120  mg at 07/03/11 1119

## 2011-07-03 NOTE — Patient Instructions (Addendum)
Nexium 40 mg x 30-60 min before bfast and add Pepcid 20 mg ac at bedtime when coughing  Please see patient coordinator before you leave today to obtain Performist and budosenide in a nebulizer   Stop ipatropium and use albuterol nebulizer every 4 hours  If needed for breathing if you can't catch your breath  See Tammy NP w/in 2 weeks with all your medications, even over the counter meds, separated in two separate bags, the ones you take no matter what vs the ones you stop once you feel better and take only as needed when you feel you need them.   Tammy  will generate for you a new user friendly medication calendar that will put [place] all on the same page re: your medication use.     Without this process, it simply isn't possible to assure that we are providing  your outpatient care  with  the attention to detail we feel you deserve.   If we cannot assure that you're getting that kind of care,  then we cannot manage your problem effectively from this clinic.  Once you have seen Tammy and we are sure that we're all on the same page with your medication use she will arrange follow up with  Dr Delford Field.

## 2011-07-03 NOTE — Telephone Encounter (Signed)
I called pt and corrected the error on our [facility] and encouraged her to call the number she was given to acquire the meds, but the next refill she'll need the bid amt and apologized but the original rx is incorrect, should be bid and is written that way now on our mar

## 2011-07-06 ENCOUNTER — Telehealth: Payer: Self-pay | Admitting: Internal Medicine

## 2011-07-06 MED ORDER — BUDESONIDE 0.25 MG/2ML IN SUSP: 0.2500 mg | Freq: Two times a day (BID) | RESPIRATORY_TRACT | Status: DC

## 2011-07-06 NOTE — Telephone Encounter (Signed)
Printed new rx for budesonide bid to be sent to [facility] - pt would like this taken care of ASAP.  Rx placed in Dr. Thurston Hole to do and will need to be given to [facility] after signing.

## 2011-07-06 NOTE — Telephone Encounter (Signed)
signed

## 2011-07-06 NOTE — Telephone Encounter (Signed)
LMTCB for [employer]

## 2011-07-07 NOTE — Telephone Encounter (Signed)
lmtcb for Kristin Estrada

## 2011-07-08 MED ORDER — FORMOTEROL FUMARATE 20 MCG/2ML IN NEBU: 20.0000 ug | INHALATION_SOLUTION | Freq: Two times a day (BID) | RESPIRATORY_TRACT | Status: DC

## 2011-07-08 NOTE — Telephone Encounter (Signed)
Called [facility], spoke with Diane, pharmacist, who states the perforomist rx was written for # 2 mL, which is 1 vial with 11 refills.  ? Was this supposed to be for a 1 month supply.  She is requesting VO for this.  I gave verbal order for perforomist #120 mL x 11.  Diane verbalized understanding of this and voiced no further questions/concerns at this time.

## 2011-07-16 ENCOUNTER — Ambulatory Visit (INDEPENDENT_AMBULATORY_CARE_PROVIDER_SITE_OTHER): Payer: Medicare Other | Admitting: Adult Health

## 2011-07-16 ENCOUNTER — Encounter: Payer: Self-pay | Admitting: Adult Health

## 2011-07-16 VITALS — BP 120/78 | HR 67 | Temp 97.7°F | Ht 64.0 in | Wt 163.0 lb

## 2011-07-16 DIAGNOSIS — J449 Chronic obstructive pulmonary disease, unspecified: Secondary | ICD-10-CM

## 2011-07-16 NOTE — Patient Instructions (Signed)
Follow med calendar closely and bring to each visit.  follow up Dr. Delford Field  In 3-4 months and As needed

## 2011-07-16 NOTE — Assessment & Plan Note (Signed)
Recent flare now resolved  Improved on current regimen.  Patient's medications were reviewed today and patient education was given. Computerized medication calendar was adjusted/completed

## 2011-07-16 NOTE — Progress Notes (Signed)
  Subjective:    Patient ID: Kristin Estrada, female    DOB: 06/28/1940, 71 y.o.   MRN: [id number]  HPI This is a 71 y.o.  WF with hx of Gold C COPD quit smoking in 2003       06/26/2011 Trouble with symbicort.  Mouth sores.  Now is coughing more.  Not using symbicort d/t mouth issues. Dyspnea is worse and coughing more thick mucus.  Side effects from  Doxycycline. PCP rx this for bronchitis first week of April. Rec Use Zetonna samples Stop symbicort Start Spiriva daily    07/03/11 Wert/ Acute w/in visit cc abuptly  felt worse with fever / yellow mucus 5/11 then started avelox 5/13 cc  prod cough with large amount of yellow sputum and increased SOB for the past x 5 days, jittery from nebulizer this am but did help breathing. No fever, no cp or sore throat nausea or vomiting/ leg swelling. Mucus turning lighter yellow but still difficult to cough up. >>Rx Perforomist/Budesonide and PPI   07/16/2011 Follow up  Returns for follow up and medication review. We reviewed all her medications and organized them into a medication calendar with patient education. It appears that she is taking her medications correctly. Last visit. Patient with a COPD exacerbation treated with antibiotics, and addition of Perforomist, and budesonide via nebulizer. Since last visit. Patient feels substantially improved and feels that she is back to her baseline. She has decreased cough and congestion. She denies any hemoptysis, chest pain, orthopnea, PND, or leg swelling  Review of Systems Constitutional:   No  weight loss, night sweats,  Fevers, chills, fatigue, lassitude. HEENT:   No headaches,  Difficulty swallowing,  Tooth/dental problems,  Sore throat,                No sneezing, itching, ear ache,  +nasal congestion, post nasal drip,   CV:  No chest pain,  Orthopnea, PND, swelling in lower extremities, anasarca, dizziness, palpitations  GI  No heartburn, indigestion, abdominal pain, nausea, vomiting,  diarrhea, change in bowel habits, loss of appetite  Resp:  ,  No coughing up of blood.     No chest wall deformity  Skin: no rash or lesions.  GU: no dysuria, change in color of urine, no urgency or frequency.  No flank pain.  MS:  No joint pain or swelling.  No decreased range of motion.  No back pain.  Psych:  No change in mood or affect. No depression or anxiety.  No memory loss.     Objective:   Physical Exam    Gen: NAD   ENT: No lesions,  mouth clear,  oropharynx clear, no  postnasal drip  Neck: No JVD, no TMG, no carotid bruits  Lungs: No use of accessory muscles, no dullness to percussion, distant BS,   Cardiovascular: RRR, heart sounds normal, no murmur or gallops, no peripheral edema  Abdomen: soft and NT, no HSM,  BS normal  Musculoskeletal: No deformities, no cyanosis or clubbing  Neuro: alert, non focal  Skin: Warm, no lesions or rashes        Assessment & Plan:

## 2011-07-20 NOTE — Progress Notes (Signed)
Addended by: Boone Master E on: 07/20/2011 05:35 PM   Modules accepted: Orders

## 2011-07-20 NOTE — Progress Notes (Signed)
Addended by: Boone Master E on: 07/20/2011 02:06 PM   Modules accepted: Orders

## 2011-08-07 ENCOUNTER — Telehealth: Payer: Self-pay | Admitting: Critical Care Medicine

## 2011-08-07 NOTE — Telephone Encounter (Signed)
PHARMACY REQUESTING  NYSTATIN 100000 U/ML <> TAKE (CC) 3 times a day as needed . Allergies  Allergen Reactions  . Prednisone     REACTION: agitation, insomina, mood change   Dr Delford Field is this ok to fill  Thank you

## 2011-08-12 NOTE — Telephone Encounter (Signed)
Ok to fill request for nystatin

## 2011-08-12 NOTE — Telephone Encounter (Signed)
Dr Delford Field is out of the office until July 1 Dr Maple Hudson  please advise  Thank you

## 2011-08-13 MED ORDER — NYSTATIN 100000 UNIT/ML MT SUSP: OROMUCOSAL | Status: DC

## 2011-08-13 NOTE — Telephone Encounter (Signed)
Per cy ok to call in the nystatin.rx sent

## 2011-10-13 ENCOUNTER — Telehealth: Payer: Self-pay | Admitting: Critical Care Medicine

## 2011-10-13 MED ORDER — NYSTATIN 100000 UNIT/ML MT SUSP: OROMUCOSAL | Status: DC

## 2011-10-13 NOTE — Telephone Encounter (Signed)
Ok to refill 

## 2011-10-13 NOTE — Telephone Encounter (Signed)
[employer] requesting  Nystatin 100000 u/ml  sus #240 Take 5 mls (cc) 3 times a day as needed.  Allergies  Allergen Reactions  . Prednisone     REACTION: agitation, insomina, mood change   Dr Delford Field is this ok to fill with 2 refills ? Please advise  Thank you

## 2011-10-14 ENCOUNTER — Ambulatory Visit (INDEPENDENT_AMBULATORY_CARE_PROVIDER_SITE_OTHER): Payer: Medicare Other | Admitting: Critical Care Medicine

## 2011-10-14 ENCOUNTER — Encounter: Payer: Self-pay | Admitting: Critical Care Medicine

## 2011-10-14 VITALS — BP 156/74 | HR 81 | Temp 98.3°F | Ht 64.0 in | Wt 174.2 lb

## 2011-10-14 DIAGNOSIS — J449 Chronic obstructive pulmonary disease, unspecified: Secondary | ICD-10-CM

## 2011-10-14 MED ORDER — MOXIFLOXACIN HCL 400 MG PO TABS: 400.0000 mg | ORAL_TABLET | Freq: Every day | ORAL | Status: AC

## 2011-10-14 NOTE — Assessment & Plan Note (Signed)
Golds COPD stage C. with frequent and recurrent exacerbations Intolerance of inhaled steroid and oral steroids Now with acute tracheobronchitis Plan Stop pulmicort in neb meds Stay on perforomist Start Avelox for 5days No other changes Return 2 months

## 2011-10-14 NOTE — Patient Instructions (Addendum)
Stop pulmicort in neb meds Stay on perforomist Start Avelox for 5days No other changes Return 2 months

## 2011-10-14 NOTE — Progress Notes (Signed)
Subjective:    Patient ID: Kristin Estrada, female    DOB: Mar 25, 1940, 71 y.o.   MRN: [id number]  HPI  This is a 71 y.o.  WF with hx of Gold C COPD quit smoking in 2003        07/03/11 Wert/ Acute w/in visit cc abuptly  felt worse with fever / yellow mucus 5/11 then started avelox 5/13 cc  prod cough with large amount of yellow sputum and increased SOB for the past x 5 days, jittery from nebulizer this am but did help breathing. No fever, no cp or sore throat nausea or vomiting/ leg swelling. Mucus turning lighter yellow but still difficult to cough up.  Sleeping ok without nocturnal  or early am exacerbation  of respiratory  c/o's or need for noct saba. Also denies any obvious fluctuation of symptoms with weather or environmental changes or other aggravating or alleviating factors except as outlined above   10/14/2011 Pt worse since 73/13.  Pt had gone off symbicort and went to spiriva.  Using albuterol in neb med. Dyspnea is better on perforomist and pulmicort but still having mouth issues.  Also on spiriva Was worse off symbicort.  Pt had shingles vaccine 7/19:  Red yeast rice rx for HL>>reaction and stopped.     Past Medical History  Diagnosis Date  . Acute bronchitis   . Unspecified essential hypertension   . Esophageal reflux   . Chronic airway obstruction, not elsewhere classified      Family History  Problem Relation Age of Onset  . Heart disease       History   Social History  . Marital Status: Widowed    Spouse Name: N/A    Number of Children: N/A  . Years of Education: N/A   Occupational History  . Retired    Social History Main Topics  . Smoking status: Former Smoker -- 2.0 packs/day for 30 years    Types: Cigarettes    Quit date: 02/16/2001  . Smokeless tobacco: Never Used   Comment: 2ppd x 30 years  . Alcohol Use: Not on file  . Drug Use: Not on file  . Sexually Active: Not on file   Other Topics Concern  . Not on file   Social History Narrative    . No narrative on file     Allergies  Allergen Reactions  . Prednisone     REACTION: agitation, insomina, mood change     Outpatient Prescriptions Prior to Visit  Medication Sig Dispense Refill  . albuterol (PROAIR HFA) 108 (90 BASE) MCG/ACT inhaler Inhale 2 puffs into the lungs every 4 (four) hours as needed.       Marland Kitchen albuterol (PROVENTIL) (2.5 MG/3ML) 0.083% nebulizer solution Take 2.5 mg by nebulization every 4 (four) hours as needed. DX:  496 FILE MCR PART B      . amLODipine (NORVASC) 5 MG tablet Take 5 mg by mouth daily.        . cetirizine (ZYRTEC) 10 MG tablet Take 10 mg by mouth at bedtime.       . chlorpheniramine-HYDROcodone (TUSSIONEX PENNKINETIC ER) 10-8 MG/5ML LQCR Take 5 mLs by mouth every 12 (twelve) hours as needed.  140 mL  2  . Ciclesonide 37 MCG/ACT AERS Place 2 puffs into the nose daily as needed.       . Cinnamon 500 MG capsule Take 500 mg by mouth daily.      . Coenzyme Q10 (CO Q 10) 10 MG CAPS Take 1  capsule by mouth daily.        Marland Kitchen dextromethorphan-guaiFENesin (MUCINEX DM) 30-600 MG per 12 hr tablet Take 1 tablet by mouth every 12 (twelve) hours as needed.       . diclofenac (VOLTAREN) 75 MG EC tablet Take 75 mg by mouth 2 (two) times daily.        . formoterol (PERFOROMIST) 20 MCG/2ML nebulizer solution Take 2 mLs (20 mcg total) by nebulization 2 (two) times daily.  120 mL  11  . Homeopathic Products ([employer] LEG CRAMPS PAIN RELIEF PO) Per bottle as needed      . hydrochlorothiazide (HYDRODIURIL) 25 MG tablet Take 25 mg by mouth daily.      . Magnesium 400 MG CAPS Take 2 capsules by mouth daily.       . Misc Natural Products (OSTEO BI-FLEX TRIPLE STRENGTH PO) Take 1 tablet by mouth daily.        Marland Kitchen NEXIUM 40 MG capsule Take 1 capsule by mouth Daily.      Marland Kitchen nystatin (MYCOSTATIN) 100000 UNIT/ML suspension Use 5 ml (cc) three times a day as needed.  240 mL  1  . tiotropium (SPIRIVA HANDIHALER) 18 MCG inhalation capsule Place 1 capsule (18 mcg total) into inhaler and  inhale daily.  30 capsule  11  . traMADol (ULTRAM) 50 MG tablet Take 50 mg by mouth every 6 (six) hours as needed.        . traZODone (DESYREL) 50 MG tablet Take 25 mg by mouth at bedtime.       . vitamin B-12 (CYANOCOBALAMIN) 1000 MCG tablet Take 1,000 mcg by mouth daily.      Marland Kitchen zolpidem (AMBIEN) 10 MG tablet Take 10 mg by mouth at bedtime.       . budesonide (PULMICORT) 0.25 MG/2ML nebulizer solution Take 2 mLs (0.25 mg total) by nebulization 2 (two) times daily.  360 mL  3  . dexlansoprazole (DEXILANT) 60 MG capsule Take 1 capsule (60 mg total) by mouth daily.  30 capsule  5      Review of Systems  Constitutional:   No  weight loss, night sweats,  Fevers, chills, fatigue, lassitude. HEENT:   No headaches,  Difficulty swallowing,  Tooth/dental problems,  Sore throat,                No sneezing, itching, ear ache,  +nasal congestion, post nasal drip,   CV:  No chest pain,  Orthopnea, PND, swelling in lower extremities, anasarca, dizziness, palpitations  GI  No heartburn, indigestion, abdominal pain, nausea, vomiting, diarrhea, change in bowel habits, loss of appetite  Resp:  ,  No coughing up of blood.     No chest wall deformity  Skin: no rash or lesions.  GU: no dysuria, change in color of urine, no urgency or frequency.  No flank pain.  MS:  No joint pain or swelling.  No decreased range of motion.  No back pain.  Psych:  No change in mood or affect. No depression or anxiety.  No memory loss.     Objective:   Physical Exam  Filed Vitals:   10/14/11 1215  BP: 156/74  Pulse: 81  Temp: 98.3 F (36.8 C)  TempSrc: Oral  Height: 5\' 4"  (1.626 m)  Weight: 174 lb 3.2 oz (79.017 kg)  SpO2: 94%    Gen: mod anxious ambulatory wf nad at rest   ENT: No lesions,  mouth clear,  oropharynx clear, no  postnasal drip Neck: No JVD,  no TMG, no carotid bruits  Lungs: No use of accessory muscles, no dullness to percussion, distant BS,   Cardiovascular: RRR, heart sounds normal, no  murmur or gallops, no peripheral edema  Abdomen: soft and NT, no HSM,  BS normal  Musculoskeletal: No deformities, no cyanosis or clubbing  Neuro: alert, non focal  Skin: Warm, no lesions or rashes        Assessment & Plan:   COPD Golds COPD stage C. with frequent and recurrent exacerbations Intolerance of inhaled steroid and oral steroids Now with acute tracheobronchitis Plan Stop pulmicort in neb meds Stay on perforomist Start Avelox for 5days No other changes Return 2 months     Updated Medication List Outpatient Encounter Prescriptions as of 10/14/2011  Medication Sig Dispense Refill  . albuterol (PROAIR HFA) 108 (90 BASE) MCG/ACT inhaler Inhale 2 puffs into the lungs every 4 (four) hours as needed.       Marland Kitchen albuterol (PROVENTIL) (2.5 MG/3ML) 0.083% nebulizer solution Take 2.5 mg by nebulization every 4 (four) hours as needed. DX:  496 FILE MCR PART B      . amLODipine (NORVASC) 5 MG tablet Take 5 mg by mouth daily.        . cetirizine (ZYRTEC) 10 MG tablet Take 10 mg by mouth at bedtime.       . chlorpheniramine-HYDROcodone (TUSSIONEX PENNKINETIC ER) 10-8 MG/5ML LQCR Take 5 mLs by mouth every 12 (twelve) hours as needed.  140 mL  2  . Ciclesonide 37 MCG/ACT AERS Place 2 puffs into the nose daily as needed.       . Cinnamon 500 MG capsule Take 500 mg by mouth daily.      . Coenzyme Q10 (CO Q 10) 10 MG CAPS Take 1 capsule by mouth daily.        Marland Kitchen dextromethorphan-guaiFENesin (MUCINEX DM) 30-600 MG per 12 hr tablet Take 1 tablet by mouth every 12 (twelve) hours as needed.       . diclofenac (VOLTAREN) 75 MG EC tablet Take 75 mg by mouth 2 (two) times daily.        . famotidine (PEPCID) 20 MG tablet Take 20 mg by mouth at bedtime as needed. When coughing      . formoterol (PERFOROMIST) 20 MCG/2ML nebulizer solution Take 2 mLs (20 mcg total) by nebulization 2 (two) times daily.  120 mL  11  . Homeopathic Products ([employer] LEG CRAMPS PAIN RELIEF PO) Per bottle as needed      .  hydrochlorothiazide (HYDRODIURIL) 25 MG tablet Take 25 mg by mouth daily.      . Magnesium 400 MG CAPS Take 2 capsules by mouth daily.       . Misc Natural Products (OSTEO BI-FLEX TRIPLE STRENGTH PO) Take 1 tablet by mouth daily.        Marland Kitchen NEXIUM 40 MG capsule Take 1 capsule by mouth Daily.      Marland Kitchen nystatin (MYCOSTATIN) 100000 UNIT/ML suspension Use 5 ml (cc) three times a day as needed.  240 mL  1  . tiotropium (SPIRIVA HANDIHALER) 18 MCG inhalation capsule Place 1 capsule (18 mcg total) into inhaler and inhale daily.  30 capsule  11  . traMADol (ULTRAM) 50 MG tablet Take 50 mg by mouth every 6 (six) hours as needed.        . traZODone (DESYREL) 50 MG tablet Take 25 mg by mouth at bedtime.       . vitamin B-12 (CYANOCOBALAMIN) 1000 MCG tablet Take 1,000  mcg by mouth daily.      Marland Kitchen zolpidem (AMBIEN) 10 MG tablet Take 10 mg by mouth at bedtime.       Marland Kitchen DISCONTD: budesonide (PULMICORT) 0.25 MG/2ML nebulizer solution Take 2 mLs (0.25 mg total) by nebulization 2 (two) times daily.  360 mL  3  . DISCONTD: lansoprazole (PREVACID) 15 MG capsule Take 15 mg by mouth daily as needed. When coughing      . moxifloxacin (AVELOX) 400 MG tablet Take 1 tablet (400 mg total) by mouth daily.  5 tablet  0  . DISCONTD: dexlansoprazole (DEXILANT) 60 MG capsule Take 1 capsule (60 mg total) by mouth daily.  30 capsule  5

## 2011-10-29 ENCOUNTER — Telehealth: Payer: Self-pay | Admitting: Critical Care Medicine

## 2011-10-29 NOTE — Telephone Encounter (Signed)
Spoke with pt. She states that she is needing to know if she can still use her albuterol HHN prn. She was unsure b/c some meds were changed at last ov. I advised that she can still take the albuterol prn. She verbalized understanding and states nothing further needed.

## 2011-11-02 ENCOUNTER — Telehealth: Payer: Self-pay | Admitting: Critical Care Medicine

## 2011-11-02 NOTE — Telephone Encounter (Signed)
Dr Delford Field just had a cancellation for tomorrow. I have called pt and she will come in. msg no longer needed. Hazel Sams

## 2011-11-03 ENCOUNTER — Encounter: Payer: Self-pay | Admitting: Critical Care Medicine

## 2011-11-03 ENCOUNTER — Ambulatory Visit (INDEPENDENT_AMBULATORY_CARE_PROVIDER_SITE_OTHER): Payer: Medicare Other | Admitting: Critical Care Medicine

## 2011-11-03 VITALS — BP 162/86 | HR 79 | Temp 98.3°F | Ht 64.0 in | Wt 172.6 lb

## 2011-11-03 DIAGNOSIS — J449 Chronic obstructive pulmonary disease, unspecified: Secondary | ICD-10-CM

## 2011-11-03 DIAGNOSIS — J4489 Other specified chronic obstructive pulmonary disease: Secondary | ICD-10-CM

## 2011-11-03 MED ORDER — PREDNISONE 10 MG PO TABS: ORAL_TABLET | ORAL | Status: DC

## 2011-11-03 MED ORDER — ACLIDINIUM BROMIDE 400 MCG/ACT IN AEPB: 1.0000 | INHALATION_SPRAY | Freq: Two times a day (BID) | RESPIRATORY_TRACT | Status: DC

## 2011-11-03 NOTE — Progress Notes (Signed)
Subjective:    Patient ID: Kristin Estrada, female    DOB: [birth year], 71 y.o.   MRN: [id number]  HPI  This is a 71 y.o.  WF with hx of Gold C COPD quit smoking in 2003        07/03/11 Wert/ Acute w/in visit cc abuptly  felt worse with fever / yellow mucus 5/11 then started avelox 5/13 cc  prod cough with large amount of yellow sputum and increased SOB for the past x 5 days, jittery from nebulizer this am but did help breathing. No fever, no cp or sore throat nausea or vomiting/ leg swelling. Mucus turning lighter yellow but still difficult to cough up.  Sleeping ok without nocturnal  or early am exacerbation  of respiratory  c/o's or need for noct saba. Also denies any obvious fluctuation of symptoms with weather or environmental changes or other aggravating or alleviating factors except as outlined above   10/14/2011 Pt worse since 5/13.  Pt had gone off symbicort and went to spiriva.  Using albuterol in neb med. Dyspnea is better on perforomist and pulmicort but still having mouth issues.  Also on spiriva Was worse off symbicort.  Pt had shingles vaccine 7/19:  Red yeast rice rx for HL>>reaction and stopped.    11/03/2011 At last ov we rx avelox and stopped pulmicort. Pt still with issues and now off perforomist and spiriva and now on symbicort alone. Now has productive cough and is wheezing.  Pt coughing beige mucus.  Notes some pndrip. Pt notes side of throat is sore.  No real chest pain.  No edema in feet.   Past Medical History  Diagnosis Date  . Acute bronchitis   . Unspecified essential hypertension   . Esophageal reflux   . Chronic airway obstruction, not elsewhere classified      Family History  Problem Relation Age of Onset  . Heart disease       History   Social History  . Marital Status: Widowed    Spouse Name: N/A    Number of Children: N/A  . Years of Education: N/A   Occupational History  . Retired    Social History Main Topics  . Smoking status:  Former Smoker -- 2.0 packs/day for 30 years    Types: Cigarettes    Quit date: 02/16/2001  . Smokeless tobacco: Never Used   Comment: 2ppd x 30 years  . Alcohol Use: Not on file  . Drug Use: Not on file  . Sexually Active: Not on file   Other Topics Concern  . Not on file   Social History Narrative  . No narrative on file     Allergies  Allergen Reactions  . Prednisone     REACTION: agitation, insomina, mood change     Outpatient Prescriptions Prior to Visit  Medication Sig Dispense Refill  . albuterol (PROAIR HFA) 108 (90 BASE) MCG/ACT inhaler Inhale 2 puffs into the lungs every 4 (four) hours as needed.       Marland Kitchen albuterol (PROVENTIL) (2.5 MG/3ML) 0.083% nebulizer solution Take 2.5 mg by nebulization every 4 (four) hours as needed. DX:  496 FILE MCR PART B      . amLODipine (NORVASC) 5 MG tablet Take 5 mg by mouth daily.        . cetirizine (ZYRTEC) 10 MG tablet Take 10 mg by mouth at bedtime.       . chlorpheniramine-HYDROcodone (TUSSIONEX PENNKINETIC ER) 10-8 MG/5ML LQCR Take 5 mLs by mouth every  12 (twelve) hours as needed.  140 mL  2  . Ciclesonide 37 MCG/ACT AERS Place 2 puffs into the nose daily as needed.       . Cinnamon 500 MG capsule Take 500 mg by mouth daily.      . Coenzyme Q10 (CO Q 10) 10 MG CAPS Take 1 capsule by mouth daily.        Marland Kitchen dextromethorphan-guaiFENesin (MUCINEX DM) 30-600 MG per 12 hr tablet Take 1 tablet by mouth every 12 (twelve) hours as needed.       . diclofenac (VOLTAREN) 75 MG EC tablet Take 75 mg by mouth 2 (two) times daily.        . famotidine (PEPCID) 20 MG tablet Take 20 mg by mouth at bedtime as needed. When coughing      . Homeopathic Products ([employer] LEG CRAMPS PAIN RELIEF PO) Per bottle as needed      . hydrochlorothiazide (HYDRODIURIL) 25 MG tablet Take 25 mg by mouth daily.      . Magnesium 400 MG CAPS Take 2 capsules by mouth daily.       . Misc Natural Products (OSTEO BI-FLEX TRIPLE STRENGTH PO) Take 1 tablet by mouth daily.        Marland Kitchen  NEXIUM 40 MG capsule Take 1 capsule by mouth Daily.      Marland Kitchen nystatin (MYCOSTATIN) 100000 UNIT/ML suspension Use 5 ml (cc) three times a day as needed.  240 mL  1  . traMADol (ULTRAM) 50 MG tablet Take 50 mg by mouth every 6 (six) hours as needed.        . traZODone (DESYREL) 50 MG tablet Take 25 mg by mouth at bedtime.       . vitamin B-12 (CYANOCOBALAMIN) 1000 MCG tablet Take 1,000 mcg by mouth daily.      Marland Kitchen zolpidem (AMBIEN) 10 MG tablet Take 10 mg by mouth at bedtime.       . formoterol (PERFOROMIST) 20 MCG/2ML nebulizer solution Take 2 mLs (20 mcg total) by nebulization 2 (two) times daily.  120 mL  11  . tiotropium (SPIRIVA HANDIHALER) 18 MCG inhalation capsule Place 1 capsule (18 mcg total) into inhaler and inhale daily.  30 capsule  11      Review of Systems  Constitutional:   No  weight loss, night sweats,  Fevers, chills, fatigue, lassitude. HEENT:   No headaches,  Difficulty swallowing,  Tooth/dental problems,  Sore throat,                No sneezing, itching, ear ache,  +nasal congestion, post nasal drip,   CV:  No chest pain,  Orthopnea, PND, swelling in lower extremities, anasarca, dizziness, palpitations  GI  No heartburn, indigestion, abdominal pain, nausea, vomiting, diarrhea, change in bowel habits, loss of appetite  Resp:  ,  No coughing up of blood.     No chest wall deformity  Skin: no rash or lesions.  GU: no dysuria, change in color of urine, no urgency or frequency.  No flank pain.  MS:  No joint pain or swelling.  No decreased range of motion.  No back pain.  Psych:  No change in mood or affect. No depression or anxiety.  No memory loss.     Objective:   Physical Exam  Filed Vitals:   11/03/11 1059  BP: 162/86  Pulse: 79  Temp: 98.3 F (36.8 C)  TempSrc: Oral  Height: 5\' 4"  (1.626 m)  Weight: 172 lb 9.6 oz (  78.291 kg)  SpO2: 93%    Gen: mod anxious ambulatory wf nad at rest   ENT: No lesions,  mouth clear,  oropharynx clear, no  postnasal  drip Neck: No JVD, no TMG, no carotid bruits  Lungs: No use of accessory muscles, no dullness to percussion, distant BS,   Cardiovascular: RRR, heart sounds normal, no murmur or gallops, no peripheral edema  Abdomen: soft and NT, no HSM,  BS normal  Musculoskeletal: No deformities, no cyanosis or clubbing  Neuro: alert, non focal  Skin: Warm, no lesions or rashes        Assessment & Plan:   COPD Stable Copd Golds C Recurrent exacerbations Intolerant of BD neb meds Plan Stop perforomist Stop ipratroprium Use albuterol as needed in nebulizer Stay on symbicort two puff twice daily Try Tudorza one puff twice daily Prednisone 10mg  Take 4 for three days 3 for three days 2 for three days 1 for three days and stop Return two weeks     Updated Medication List Outpatient Encounter Prescriptions as of 11/03/2011  Medication Sig Dispense Refill  . albuterol (PROAIR HFA) 108 (90 BASE) MCG/ACT inhaler Inhale 2 puffs into the lungs every 4 (four) hours as needed.       Marland Kitchen albuterol (PROVENTIL) (2.5 MG/3ML) 0.083% nebulizer solution Take 2.5 mg by nebulization every 4 (four) hours as needed. DX:  496 FILE MCR PART B      . amLODipine (NORVASC) 5 MG tablet Take 5 mg by mouth daily.        . cetirizine (ZYRTEC) 10 MG tablet Take 10 mg by mouth at bedtime.       . chlorpheniramine-HYDROcodone (TUSSIONEX PENNKINETIC ER) 10-8 MG/5ML LQCR Take 5 mLs by mouth every 12 (twelve) hours as needed.  140 mL  2  . Ciclesonide 37 MCG/ACT AERS Place 2 puffs into the nose daily as needed.       . Cinnamon 500 MG capsule Take 500 mg by mouth daily.      . Coenzyme Q10 (CO Q 10) 10 MG CAPS Take 1 capsule by mouth daily.        Marland Kitchen dextromethorphan-guaiFENesin (MUCINEX DM) 30-600 MG per 12 hr tablet Take 1 tablet by mouth every 12 (twelve) hours as needed.       . diclofenac (VOLTAREN) 75 MG EC tablet Take 75 mg by mouth 2 (two) times daily.        . famotidine (PEPCID) 20 MG tablet Take 20 mg by mouth at  bedtime as needed. When coughing      . Homeopathic Products ([employer] LEG CRAMPS PAIN RELIEF PO) Per bottle as needed      . hydrochlorothiazide (HYDRODIURIL) 25 MG tablet Take 25 mg by mouth daily.      . Magnesium 400 MG CAPS Take 2 capsules by mouth daily.       . Misc Natural Products (OSTEO BI-FLEX TRIPLE STRENGTH PO) Take 1 tablet by mouth daily.        Marland Kitchen NEXIUM 40 MG capsule Take 1 capsule by mouth Daily.      Marland Kitchen nystatin (MYCOSTATIN) 100000 UNIT/ML suspension Use 5 ml (cc) three times a day as needed.  240 mL  1  . SYMBICORT 160-4.5 MCG/ACT inhaler Inhale 2 puffs into the lungs Twice daily.      . traMADol (ULTRAM) 50 MG tablet Take 50 mg by mouth every 6 (six) hours as needed.        . traZODone (DESYREL) 50 MG tablet  Take 25 mg by mouth at bedtime.       . vitamin B-12 (CYANOCOBALAMIN) 1000 MCG tablet Take 1,000 mcg by mouth daily.      Marland Kitchen zolpidem (AMBIEN) 10 MG tablet Take 10 mg by mouth at bedtime.       Marland Kitchen DISCONTD: ipratropium-albuterol (DUONEB) 0.5-2.5 (3) MG/3ML SOLN Take 3 mLs by nebulization 4 (four) times daily as needed.      . Aclidinium Bromide (TUDORZA PRESSAIR) 400 MCG/ACT AEPB Inhale 1 puff into the lungs 2 (two) times daily.  1 each  6  . predniSONE (DELTASONE) 10 MG tablet Take 4 for three days 3 for three days 2 for three days 1 for three days and stop  30 tablet  0  . DISCONTD: formoterol (PERFOROMIST) 20 MCG/2ML nebulizer solution Take 2 mLs (20 mcg total) by nebulization 2 (two) times daily.  120 mL  11  . DISCONTD: tiotropium (SPIRIVA HANDIHALER) 18 MCG inhalation capsule Place 1 capsule (18 mcg total) into inhaler and inhale daily.  30 capsule  11

## 2011-11-03 NOTE — Patient Instructions (Addendum)
Stop perforomist Stop ipratroprium Use albuterol as needed in nebulizer Stay on symbicort two puff twice daily Try Tudorza one puff twice daily Prednisone 10mg  Take 4 for three days 3 for three days 2 for three days 1 for three days and stop Return two weeks

## 2011-11-04 NOTE — Assessment & Plan Note (Signed)
Stable Copd Golds C Recurrent exacerbations Intolerant of BD neb meds Plan Stop perforomist Stop ipratroprium Use albuterol as needed in nebulizer Stay on symbicort two puff twice daily Try Tudorza one puff twice daily Prednisone 10mg  Take 4 for three days 3 for three days 2 for three days 1 for three days and stop Return two weeks

## 2011-11-25 ENCOUNTER — Ambulatory Visit (INDEPENDENT_AMBULATORY_CARE_PROVIDER_SITE_OTHER): Payer: Medicare Other | Admitting: Critical Care Medicine

## 2011-11-25 ENCOUNTER — Encounter: Payer: Self-pay | Admitting: Critical Care Medicine

## 2011-11-25 VITALS — BP 140/76 | HR 80 | Temp 98.5°F | Ht 64.0 in | Wt 177.4 lb

## 2011-11-25 DIAGNOSIS — Z23 Encounter for immunization: Secondary | ICD-10-CM

## 2011-11-25 DIAGNOSIS — J449 Chronic obstructive pulmonary disease, unspecified: Secondary | ICD-10-CM

## 2011-11-25 NOTE — Progress Notes (Signed)
Subjective:    Patient ID: Kristin Estrada, female    DOB: 1941/01/11, 71 y.o.   MRN: [id number]  HPI  This is a 71 y.o.  WF with hx of Gold C COPD quit smoking in 2003        07/03/11 Wert/ Acute w/in visit cc abuptly  felt worse with fever / yellow mucus 5/11 then started avelox 5/13 cc  prod cough with large amount of yellow sputum and increased SOB for the past x 5 days, jittery from nebulizer this am but did help breathing. No fever, no cp or sore throat nausea or vomiting/ leg swelling. Mucus turning lighter yellow but still difficult to cough up.  Sleeping ok without nocturnal  or early am exacerbation  of respiratory  c/o's or need for noct saba. Also denies any obvious fluctuation of symptoms with weather or environmental changes or other aggravating or alleviating factors except as outlined above   10/14/2011 Pt worse since 5/13.  Pt had gone off symbicort and went to spiriva.  Using albuterol in neb med. Dyspnea is better on perforomist and pulmicort but still having mouth issues.  Also on spiriva Was worse off symbicort.  Pt had shingles vaccine 7/19:  Red yeast rice rx for HL>>reaction and stopped.    11/03/2011 At last ov we rx avelox and stopped pulmicort. Pt still with issues and now off perforomist and spiriva and now on symbicort alone. Now has productive cough and is wheezing.  Pt coughing beige mucus.  Notes some pndrip. Pt notes side of throat is sore.  No real chest pain.  No edema in feet.  11/25/2011 Now is better, less cough and wheezing.  No beige mucus than as before.  Sore throat occasionally, but better. Pt denies any significant sore throat, nasal congestion or excess secretions, fever, chills, sweats, unintended weight loss, pleurtic or exertional chest pain, orthopnea PND, or leg swelling Pt denies any increase in rescue therapy over baseline, denies waking up needing it or having any early am or nocturnal exacerbations of coughing/wheezing/or dyspnea. Pt also  denies any obvious fluctuation in symptoms with  weather or environmental change or other alleviating or aggravating factors [name] has helped.  Also still on symbicort. Also using albuerol tid in neb med.    Past Medical History  Diagnosis Date  . Acute bronchitis   . Unspecified essential hypertension   . Esophageal reflux   . Chronic airway obstruction, not elsewhere classified      Family History  Problem Relation Age of Onset  . Heart disease       History   Social History  . Marital Status: Widowed    Spouse Name: N/A    Number of Children: N/A  . Years of Education: N/A   Occupational History  . Retired    Social History Main Topics  . Smoking status: Former Smoker -- 2.0 packs/day for 30 years    Types: Cigarettes    Quit date: 02/16/2001  . Smokeless tobacco: Never Used   Comment: 2ppd x 30 years  . Alcohol Use: Not on file  . Drug Use: Not on file  . Sexually Active: Not on file   Other Topics Concern  . Not on file   Social History Narrative  . No narrative on file     Allergies  Allergen Reactions  . Prednisone     REACTION: agitation, insomina, mood change     Outpatient Prescriptions Prior to Visit  Medication Sig Dispense Refill  .  Aclidinium Bromide (TUDORZA PRESSAIR) 400 MCG/ACT AEPB Inhale 1 puff into the lungs 2 (two) times daily.  1 each  6  . albuterol (PROAIR HFA) 108 (90 BASE) MCG/ACT inhaler Inhale 2 puffs into the lungs every 4 (four) hours as needed.       Marland Kitchen albuterol (PROVENTIL) (2.5 MG/3ML) 0.083% nebulizer solution Take 2.5 mg by nebulization every 4 (four) hours as needed. DX:  496 FILE MCR PART B      . amLODipine (NORVASC) 5 MG tablet Take 5 mg by mouth daily.        . cetirizine (ZYRTEC) 10 MG tablet Take 10 mg by mouth at bedtime.       . chlorpheniramine-HYDROcodone (TUSSIONEX PENNKINETIC ER) 10-8 MG/5ML LQCR Take 5 mLs by mouth every 12 (twelve) hours as needed.  140 mL  2  . Ciclesonide 37 MCG/ACT AERS Place 2  puffs into the nose daily as needed.       . Cinnamon 500 MG capsule Take 500 mg by mouth daily.      . Coenzyme Q10 (CO Q 10) 10 MG CAPS Take 1 capsule by mouth daily.        Marland Kitchen dextromethorphan-guaiFENesin (MUCINEX DM) 30-600 MG per 12 hr tablet Take 1 tablet by mouth every 12 (twelve) hours as needed.       . diclofenac (VOLTAREN) 75 MG EC tablet Take 75 mg by mouth 2 (two) times daily.        . famotidine (PEPCID) 20 MG tablet Take 20 mg by mouth at bedtime as needed. When coughing      . Homeopathic Products ([employer] LEG CRAMPS PAIN RELIEF PO) Per bottle as needed      . hydrochlorothiazide (HYDRODIURIL) 25 MG tablet Take 25 mg by mouth daily.      . Magnesium 400 MG CAPS Take 2 capsules by mouth daily.       . Misc Natural Products (OSTEO BI-FLEX TRIPLE STRENGTH PO) Take 1 tablet by mouth daily.        Marland Kitchen NEXIUM 40 MG capsule Take 1 capsule by mouth Daily.      Marland Kitchen nystatin (MYCOSTATIN) 100000 UNIT/ML suspension Use 5 ml (cc) three times a day as needed.  240 mL  1  . SYMBICORT 160-4.5 MCG/ACT inhaler Inhale 2 puffs into the lungs Twice daily.      . traMADol (ULTRAM) 50 MG tablet Take 50 mg by mouth every 6 (six) hours as needed.        . traZODone (DESYREL) 50 MG tablet Take 25 mg by mouth at bedtime.       . vitamin B-12 (CYANOCOBALAMIN) 1000 MCG tablet Take 1,000 mcg by mouth daily.      Marland Kitchen zolpidem (AMBIEN) 10 MG tablet Take 10 mg by mouth at bedtime.       . predniSONE (DELTASONE) 10 MG tablet Take 4 for three days 3 for three days 2 for three days 1 for three days and stop  30 tablet  0      Review of Systems  Constitutional:   No  weight loss, night sweats,  Fevers, chills, fatigue, lassitude. HEENT:   No headaches,  Difficulty swallowing,  Tooth/dental problems,  Sore throat,                No sneezing, itching, ear ache,  +nasal congestion, post nasal drip,   CV:  No chest pain,  Orthopnea, PND, swelling in lower extremities, anasarca, dizziness, palpitations  GI  No heartburn,  indigestion, abdominal pain, nausea, vomiting, diarrhea, change in bowel habits, loss of appetite  Resp:  ,  No coughing up of blood.     No chest wall deformity  Skin: no rash or lesions.  GU: no dysuria, change in color of urine, no urgency or frequency.  No flank pain.  MS:  No joint pain or swelling.  No decreased range of motion.  No back pain.  Psych:  No change in mood or affect. No depression or anxiety.  No memory loss.     Objective:   Physical Exam  Filed Vitals:   11/25/11 1044  BP: 140/76  Pulse: 80  Temp: 98.5 F (36.9 C)  TempSrc: Oral  Height: 5\' 4"  (1.626 m)  Weight: 177 lb 6.4 oz (80.468 kg)  SpO2: 91%    Gen: mod anxious ambulatory wf nad at rest   ENT: No lesions,  mouth clear,  oropharynx clear, no  postnasal drip Neck: No JVD, no TMG, no carotid bruits  Lungs: No use of accessory muscles, no dullness to percussion, distant BS,   Cardiovascular: RRR, heart sounds normal, no murmur or gallops, no peripheral edema  Abdomen: soft and NT, no HSM,  BS normal  Musculoskeletal: No deformities, no cyanosis or clubbing  Neuro: alert, non focal  Skin: Warm, no lesions or rashes        Assessment & Plan:   COPD Gold C Copd improved with recent copd flare CAT 16 11/25/2011 Plan  No change in inhaled or maintenance medications. Return in  3 months Flu vaccine       Updated Medication List Outpatient Encounter Prescriptions as of 11/25/2011  Medication Sig Dispense Refill  . Aclidinium Bromide (TUDORZA PRESSAIR) 400 MCG/ACT AEPB Inhale 1 puff into the lungs 2 (two) times daily.  1 each  6  . albuterol (PROAIR HFA) 108 (90 BASE) MCG/ACT inhaler Inhale 2 puffs into the lungs every 4 (four) hours as needed.       Marland Kitchen albuterol (PROVENTIL) (2.5 MG/3ML) 0.083% nebulizer solution Take 2.5 mg by nebulization every 4 (four) hours as needed. DX:  496 FILE MCR PART B      . amLODipine (NORVASC) 5 MG tablet Take 5 mg by mouth daily.        . cetirizine  (ZYRTEC) 10 MG tablet Take 10 mg by mouth at bedtime.       . chlorpheniramine-HYDROcodone (TUSSIONEX PENNKINETIC ER) 10-8 MG/5ML LQCR Take 5 mLs by mouth every 12 (twelve) hours as needed.  140 mL  2  . Ciclesonide 37 MCG/ACT AERS Place 2 puffs into the nose daily as needed.       . Cinnamon 500 MG capsule Take 500 mg by mouth daily.      . Coenzyme Q10 (CO Q 10) 10 MG CAPS Take 1 capsule by mouth daily.        Marland Kitchen dextromethorphan-guaiFENesin (MUCINEX DM) 30-600 MG per 12 hr tablet Take 1 tablet by mouth every 12 (twelve) hours as needed.       . diclofenac (VOLTAREN) 75 MG EC tablet Take 75 mg by mouth 2 (two) times daily.        . famotidine (PEPCID) 20 MG tablet Take 20 mg by mouth at bedtime as needed. When coughing      . Homeopathic Products ([employer] LEG CRAMPS PAIN RELIEF PO) Per bottle as needed      . hydrochlorothiazide (HYDRODIURIL) 25 MG tablet Take 25 mg by mouth daily.      Marland Kitchen  LOTEMAX 0.5 % ophthalmic suspension Place 1 drop into the right eye 4 times daily.      . Magnesium 400 MG CAPS Take 2 capsules by mouth daily.       . Misc Natural Products (OSTEO BI-FLEX TRIPLE STRENGTH PO) Take 1 tablet by mouth daily.        Marland Kitchen NEXIUM 40 MG capsule Take 1 capsule by mouth Daily.      Marland Kitchen nystatin (MYCOSTATIN) 100000 UNIT/ML suspension Use 5 ml (cc) three times a day as needed.  240 mL  1  . SYMBICORT 160-4.5 MCG/ACT inhaler Inhale 2 puffs into the lungs Twice daily.      . traMADol (ULTRAM) 50 MG tablet Take 50 mg by mouth every 6 (six) hours as needed.        . traZODone (DESYREL) 50 MG tablet Take 25 mg by mouth at bedtime.       . vitamin B-12 (CYANOCOBALAMIN) 1000 MCG tablet Take 1,000 mcg by mouth daily.      Marland Kitchen zolpidem (AMBIEN) 10 MG tablet Take 10 mg by mouth at bedtime.       Marland Kitchen DISCONTD: predniSONE (DELTASONE) 10 MG tablet Take 4 for three days 3 for three days 2 for three days 1 for three days and stop  30 tablet  0

## 2011-11-25 NOTE — Patient Instructions (Addendum)
No change in medications. Return in        2 months       Flu vaccine today 

## 2011-11-25 NOTE — Assessment & Plan Note (Addendum)
Gold C Copd improved with recent copd flare CAT 16 11/25/2011 Plan  No change in inhaled or maintenance medications. Return in  3 months Flu vaccine

## 2011-12-22 ENCOUNTER — Ambulatory Visit: Payer: Medicare Other | Admitting: Critical Care Medicine

## 2011-12-23 ENCOUNTER — Encounter: Payer: Self-pay | Admitting: Adult Health

## 2011-12-23 ENCOUNTER — Ambulatory Visit (INDEPENDENT_AMBULATORY_CARE_PROVIDER_SITE_OTHER): Payer: Medicare Other | Admitting: Adult Health

## 2011-12-23 VITALS — BP 122/72 | HR 69 | Temp 97.0°F | Ht 64.0 in | Wt 176.2 lb

## 2011-12-23 DIAGNOSIS — J449 Chronic obstructive pulmonary disease, unspecified: Secondary | ICD-10-CM

## 2011-12-23 MED ORDER — AMOXICILLIN-POT CLAVULANATE 875-125 MG PO TABS: 1.0000 | ORAL_TABLET | Freq: Two times a day (BID) | ORAL | Status: AC

## 2011-12-23 MED ORDER — BUDESONIDE-FORMOTEROL FUMARATE 160-4.5 MCG/ACT IN AERO: 2.0000 | INHALATION_SPRAY | Freq: Two times a day (BID) | RESPIRATORY_TRACT | Status: DC

## 2011-12-23 NOTE — Progress Notes (Signed)
Subjective:    Patient ID: Kristin Estrada, female    DOB: August 26, 1940, 71 y.o.   MRN: [id number]  HPI  This is a 71 y.o.  WF with hx of Gold C COPD quit smoking in 2003      07/03/11 Wert/ Acute w/in visit cc abuptly  felt worse with fever / yellow mucus 5/11 then started avelox 5/13 cc  prod cough with large amount of yellow sputum and increased SOB for the past x 5 days, jittery from nebulizer this am but did help breathing. No fever, no cp or sore throat nausea or vomiting/ leg swelling. Mucus turning lighter yellow but still difficult to cough up.  Sleeping ok without nocturnal  or early am exacerbation  of respiratory  c/o's or need for noct saba. Also denies any obvious fluctuation of symptoms with weather or environmental changes or other aggravating or alleviating factors except as outlined above   10/14/2011 Pt worse since 5/13.  Pt had gone off symbicort and went to spiriva.  Using albuterol in neb med. Dyspnea is better on perforomist and pulmicort but still having mouth issues.  Also on spiriva Was worse off symbicort.  Pt had shingles vaccine 7/19:  Red yeast rice rx for HL>>reaction and stopped.    11/03/2011 At last ov we rx avelox and stopped pulmicort. Pt still with issues and now off perforomist and spiriva and now on symbicort alone. Now has productive cough and is wheezing.  Pt coughing beige mucus.  Notes some pndrip. Pt notes side of throat is sore.  No real chest pain.  No edema in feet.  11/25/2011 Now is better, less cough and wheezing.  No beige mucus than as before.  Sore throat occasionally, but better. [name] has helped.  Also still on symbicort. Also using albuerol tid in neb med. >>no changes   12/23/2011 Acute OV  Complains of chest/sinus congestion, cough with some yellow/white thick sputum for 3 days. Minimal wheezing . No chest pain , fever, orthopnea or edema.  No n/v.  Congestion is mainly white thick mucus.      Past Medical History    Diagnosis Date  . Acute bronchitis   . Unspecified essential hypertension   . Esophageal reflux   . Chronic airway obstruction, not elsewhere classified      Family History  Problem Relation Age of Onset  . Heart disease       History   Social History  . Marital Status: Widowed    Spouse Name: N/A    Number of Children: N/A  . Years of Education: N/A   Occupational History  . Retired    Social History Main Topics  . Smoking status: Former Smoker -- 2.0 packs/day for 30 years    Types: Cigarettes    Quit date: 02/16/2001  . Smokeless tobacco: Never Used     Comment: 2ppd x 30 years  . Alcohol Use: Not on file  . Drug Use: Not on file  . Sexually Active: Not on file   Other Topics Concern  . Not on file   Social History Narrative  . No narrative on file     Allergies  Allergen Reactions  . Prednisone     REACTION: agitation, insomina, mood change     Outpatient Prescriptions Prior to Visit  Medication Sig Dispense Refill  . Aclidinium Bromide (TUDORZA PRESSAIR) 400 MCG/ACT AEPB Inhale 1 puff into the lungs 2 (two) times daily.  1 each  6  . albuterol (  PROAIR HFA) 108 (90 BASE) MCG/ACT inhaler Inhale 2 puffs into the lungs every 4 (four) hours as needed.       Marland Kitchen albuterol (PROVENTIL) (2.5 MG/3ML) 0.083% nebulizer solution Take 2.5 mg by nebulization every 4 (four) hours as needed. DX:  496 FILE MCR PART B      . amLODipine (NORVASC) 5 MG tablet Take 5 mg by mouth daily.        . cetirizine (ZYRTEC) 10 MG tablet Take 10 mg by mouth at bedtime.       . Ciclesonide 37 MCG/ACT AERS Place 2 puffs into the nose daily as needed.       . Cinnamon 500 MG capsule Take 500 mg by mouth daily.      . Coenzyme Q10 (CO Q 10) 10 MG CAPS Take 1 capsule by mouth daily.        Marland Kitchen dextromethorphan-guaiFENesin (MUCINEX DM) 30-600 MG per 12 hr tablet Take 1 tablet by mouth every 12 (twelve) hours as needed.       . diclofenac (VOLTAREN) 75 MG EC tablet Take 75 mg by mouth 2 (two)  times daily.        . famotidine (PEPCID) 20 MG tablet Take 20 mg by mouth at bedtime as needed. When coughing      . Homeopathic Products ([employer] LEG CRAMPS PAIN RELIEF PO) Per bottle as needed      . hydrochlorothiazide (HYDRODIURIL) 25 MG tablet Take 25 mg by mouth daily.      Marland Kitchen LOTEMAX 0.5 % ophthalmic suspension Place 1 drop into the right eye 4 times daily.      . Magnesium 400 MG CAPS Take 2 capsules by mouth daily.       . Misc Natural Products (OSTEO BI-FLEX TRIPLE STRENGTH PO) Take 1 tablet by mouth daily.        Marland Kitchen NEXIUM 40 MG capsule Take 1 capsule by mouth Daily.      Marland Kitchen nystatin (MYCOSTATIN) 100000 UNIT/ML suspension Use 5 ml (cc) three times a day as needed.  240 mL  1  . SYMBICORT 160-4.5 MCG/ACT inhaler Inhale 2 puffs into the lungs Twice daily.      . traMADol (ULTRAM) 50 MG tablet Take 50 mg by mouth every 6 (six) hours as needed.        . traZODone (DESYREL) 50 MG tablet Take 25 mg by mouth at bedtime.       . vitamin B-12 (CYANOCOBALAMIN) 1000 MCG tablet Take 1,000 mcg by mouth daily.      Marland Kitchen zolpidem (AMBIEN) 10 MG tablet Take 10 mg by mouth at bedtime.       . chlorpheniramine-HYDROcodone (TUSSIONEX PENNKINETIC ER) 10-8 MG/5ML LQCR Take 5 mLs by mouth every 12 (twelve) hours as needed.  140 mL  2      Review of Systems  Constitutional:   No  weight loss, night sweats,  Fevers, chills, fatigue, lassitude. HEENT:   No headaches,  Difficulty swallowing,  Tooth/dental problems,  Sore throat,                No sneezing, itching, ear ache,  +nasal congestion, post nasal drip,   CV:  No chest pain,  Orthopnea, PND, swelling in lower extremities, anasarca, dizziness, palpitations  GI  No heartburn, indigestion, abdominal pain, nausea, vomiting, diarrhea, change in bowel habits, loss of appetite  Resp:  ,  No coughing up of blood.     No chest wall deformity  Skin: no rash  or lesions.  GU: no dysuria, change in color of urine, no urgency or frequency.  No flank pain.  MS:   No joint pain or swelling.  No decreased range of motion.  No back pain.  Psych:  No change in mood or affect. No depression or anxiety.  No memory loss.     Objective:   Physical Exam  Filed Vitals:   12/23/11 1028  BP: 122/72  Pulse: 69  Temp: 97 F (36.1 C)  Height: 5\' 4"  (1.626 m)  Weight: 176 lb 3.2 oz (79.924 kg)  SpO2: 96%    Gen: mod anxious ambulatory wf nad at rest   ENT: No lesions,  mouth clear,  oropharynx clear, no  postnasal drip Neck: No JVD, no TMG, no carotid bruits  Lungs: No use of accessory muscles, no dullness to percussion, distant BS,   Cardiovascular: RRR, heart sounds normal, no murmur or gallops, no peripheral edema  Abdomen: soft and NT, no HSM,  BS normal  Musculoskeletal: No deformities, no cyanosis or clubbing  Neuro: alert, non focal  Skin: Warm, no lesions or rashes        Assessment & Plan:   No problem-specific assessment & plan notes found for this encounter.   Updated Medication List Outpatient Encounter Prescriptions as of 12/23/2011  Medication Sig Dispense Refill  . Aclidinium Bromide (TUDORZA PRESSAIR) 400 MCG/ACT AEPB Inhale 1 puff into the lungs 2 (two) times daily.  1 each  6  . albuterol (PROAIR HFA) 108 (90 BASE) MCG/ACT inhaler Inhale 2 puffs into the lungs every 4 (four) hours as needed.       Marland Kitchen albuterol (PROVENTIL) (2.5 MG/3ML) 0.083% nebulizer solution Take 2.5 mg by nebulization every 4 (four) hours as needed. DX:  496 FILE MCR PART B      . amLODipine (NORVASC) 5 MG tablet Take 5 mg by mouth daily.        . cetirizine (ZYRTEC) 10 MG tablet Take 10 mg by mouth at bedtime.       . Ciclesonide 37 MCG/ACT AERS Place 2 puffs into the nose daily as needed.       . Cinnamon 500 MG capsule Take 500 mg by mouth daily.      . Coenzyme Q10 (CO Q 10) 10 MG CAPS Take 1 capsule by mouth daily.        Marland Kitchen dextromethorphan-guaiFENesin (MUCINEX DM) 30-600 MG per 12 hr tablet Take 1 tablet by mouth every 12 (twelve) hours as  needed.       . diclofenac (VOLTAREN) 75 MG EC tablet Take 75 mg by mouth 2 (two) times daily.        . famotidine (PEPCID) 20 MG tablet Take 20 mg by mouth at bedtime as needed. When coughing      . Homeopathic Products ([employer] LEG CRAMPS PAIN RELIEF PO) Per bottle as needed      . hydrochlorothiazide (HYDRODIURIL) 25 MG tablet Take 25 mg by mouth daily.      Marland Kitchen LOTEMAX 0.5 % ophthalmic suspension Place 1 drop into the right eye 4 times daily.      . Magnesium 400 MG CAPS Take 2 capsules by mouth daily.       . Misc Natural Products (OSTEO BI-FLEX TRIPLE STRENGTH PO) Take 1 tablet by mouth daily.        Marland Kitchen NEXIUM 40 MG capsule Take 1 capsule by mouth Daily.      Marland Kitchen nystatin (MYCOSTATIN) 100000 UNIT/ML suspension Use 5  ml (cc) three times a day as needed.  240 mL  1  . SYMBICORT 160-4.5 MCG/ACT inhaler Inhale 2 puffs into the lungs Twice daily.      . traMADol (ULTRAM) 50 MG tablet Take 50 mg by mouth every 6 (six) hours as needed.        . traZODone (DESYREL) 50 MG tablet Take 25 mg by mouth at bedtime.       . vitamin B-12 (CYANOCOBALAMIN) 1000 MCG tablet Take 1,000 mcg by mouth daily.      Marland Kitchen zolpidem (AMBIEN) 10 MG tablet Take 10 mg by mouth at bedtime.       . chlorpheniramine-HYDROcodone (TUSSIONEX PENNKINETIC ER) 10-8 MG/5ML LQCR Take 5 mLs by mouth every 12 (twelve) hours as needed.  140 mL  2

## 2011-12-23 NOTE — Patient Instructions (Addendum)
Augmentin 875mg  Twice daily  For 7 days with food-to have on hold if symptoms with discolored mucus.  Mucinex DM Twice daily  As needed  Cough/congestion  Fluids and rest  Please contact office for sooner follow up if symptoms do not improve or worsen or seek emergency care  follow up Dr. Delford Field  As planned

## 2011-12-23 NOTE — Assessment & Plan Note (Signed)
Flare   Plan Augmentin 875mg  Twice daily  For 7 days with food-to have on hold if symptoms with discolored mucus.  Mucinex DM Twice daily  As needed  Cough/congestion  Fluids and rest  Please contact office for sooner follow up if symptoms do not improve or worsen or seek emergency care  follow up Dr. Delford Field  As planned

## 2012-01-26 ENCOUNTER — Ambulatory Visit (INDEPENDENT_AMBULATORY_CARE_PROVIDER_SITE_OTHER): Payer: Medicare Other | Admitting: Critical Care Medicine

## 2012-01-26 ENCOUNTER — Encounter: Payer: Self-pay | Admitting: Critical Care Medicine

## 2012-01-26 VITALS — BP 140/70 | HR 84 | Temp 98.4°F | Ht 64.0 in | Wt 176.0 lb

## 2012-01-26 DIAGNOSIS — J449 Chronic obstructive pulmonary disease, unspecified: Secondary | ICD-10-CM

## 2012-01-26 MED ORDER — FAMOTIDINE 20 MG PO TABS: 40.0000 mg | ORAL_TABLET | Freq: Every day | ORAL | Status: DC

## 2012-01-26 NOTE — Patient Instructions (Signed)
No antibiotic needed Use Pepcid 40mg  at bedtime (refill sent) USe Nexium in the day time 1/2 hour before breakfast No other medication changes Follow STRICT reflux diet for two weeks  No other medication changes Return 2 months

## 2012-01-26 NOTE — Progress Notes (Signed)
Subjective:    Patient ID: Kristin Estrada, female    DOB: Mar 24, 1940, 71 y.o.   MRN: [id number]  HPI  This is a 71 y.o.  WF with hx of Gold C COPD quit smoking in 2003      07/03/11 Wert/ Acute w/in visit cc abuptly  felt worse with fever / yellow mucus 5/11 then started avelox 5/13 cc  prod cough with large amount of yellow sputum and increased SOB for the past x 5 days, jittery from nebulizer this am but did help breathing. No fever, no cp or sore throat nausea or vomiting/ leg swelling. Mucus turning lighter yellow but still difficult to cough up.  Sleeping ok without nocturnal  or early am exacerbation  of respiratory  c/o's or need for noct saba. Also denies any obvious fluctuation of symptoms with weather or environmental changes or other aggravating or alleviating factors except as outlined above   10/14/2011 Pt worse since 5/13.  Pt had gone off symbicort and went to spiriva.  Using albuterol in neb med. Dyspnea is better on perforomist and pulmicort but still having mouth issues.  Also on spiriva Was worse off symbicort.  Pt had shingles vaccine 7/19:  Red yeast rice rx for HL>>reaction and stopped.    11/03/2011 At last ov we rx avelox and stopped pulmicort. Pt still with issues and now off perforomist and spiriva and now on symbicort alone. Now has productive cough and is wheezing.  Pt coughing beige mucus.  Notes some pndrip. Pt notes side of throat is sore.  No real chest pain.  No edema in feet.  11/25/2011 Now is better, less cough and wheezing.  No beige mucus than as before.  Sore throat occasionally, but better. The [name] has helped.  Also still on symbicort. Also using albuerol tid in neb med. >>no changes   11/6 Acute OV  Complains of chest/sinus congestion, cough with some yellow/white thick sputum for 3 days. Minimal wheezing . No chest pain , fever, orthopnea or edema.  No n/v.  Congestion is mainly white thick mucus.   01/26/2012 Pt got better over symptoms  in 11/13. Then noted 4 days of sl drainage, then more over weekend.  Pt then noted more burning in throat over last 24hrs.  Notes some mucus yellow. Pt having to clear throat.  No f/c/s. No real chest pain.  Notes sl wheeze.  No real dyspnea noted.     Past Medical History  Diagnosis Date  . Acute bronchitis   . Unspecified essential hypertension   . Esophageal reflux   . Chronic airway obstruction, not elsewhere classified      Family History  Problem Relation Age of Onset  . Heart disease       History   Social History  . Marital Status: Widowed    Spouse Name: N/A    Number of Children: N/A  . Years of Education: N/A   Occupational History  . Retired    Social History Main Topics  . Smoking status: Former Smoker -- 2.0 packs/day for 30 years    Types: Cigarettes    Quit date: 02/16/2001  . Smokeless tobacco: Never Used     Comment: 2ppd x 30 years  . Alcohol Use: Not on file  . Drug Use: Not on file  . Sexually Active: Not on file   Other Topics Concern  . Not on file   Social History Narrative  . No narrative on file     Allergies  Allergen Reactions  . Prednisone     REACTION: agitation, insomina, mood change     Outpatient Prescriptions Prior to Visit  Medication Sig Dispense Refill  . Aclidinium Bromide (TUDORZA PRESSAIR) 400 MCG/ACT AEPB Inhale 1 puff into the lungs 2 (two) times daily.  1 each  6  . albuterol (PROAIR HFA) 108 (90 BASE) MCG/ACT inhaler Inhale 2 puffs into the lungs every 4 (four) hours as needed.       Marland Kitchen albuterol (PROVENTIL) (2.5 MG/3ML) 0.083% nebulizer solution Take 2.5 mg by nebulization every 4 (four) hours as needed. DX:  496 FILE MCR PART B      . amLODipine (NORVASC) 5 MG tablet Take 5 mg by mouth daily.        . budesonide-formoterol (SYMBICORT) 160-4.5 MCG/ACT inhaler Inhale 2 puffs into the lungs 2 (two) times daily.  1 Inhaler  0  . cetirizine (ZYRTEC) 10 MG tablet Take 10 mg by mouth at bedtime.       .  chlorpheniramine-HYDROcodone (TUSSIONEX PENNKINETIC ER) 10-8 MG/5ML LQCR Take 5 mLs by mouth every 12 (twelve) hours as needed.  140 mL  2  . Ciclesonide 37 MCG/ACT AERS Place 2 puffs into the nose daily as needed.       . Cinnamon 500 MG capsule Take 500 mg by mouth daily.      . Coenzyme Q10 (CO Q 10) 10 MG CAPS Take 1 capsule by mouth daily.        Marland Kitchen dextromethorphan-guaiFENesin (MUCINEX DM) 30-600 MG per 12 hr tablet Take 1 tablet by mouth every 12 (twelve) hours as needed.       . diclofenac (VOLTAREN) 75 MG EC tablet Take 75 mg by mouth 2 (two) times daily.        . Homeopathic Products ([employer] LEG CRAMPS PAIN RELIEF PO) Per bottle as needed      . hydrochlorothiazide (HYDRODIURIL) 25 MG tablet Take 25 mg by mouth daily.      . Magnesium 400 MG CAPS Take 3 capsules by mouth daily.       . Misc Natural Products (OSTEO BI-FLEX TRIPLE STRENGTH PO) Take 1 tablet by mouth daily.        Marland Kitchen NEXIUM 40 MG capsule Take 1 capsule by mouth Daily.      Marland Kitchen nystatin (MYCOSTATIN) 100000 UNIT/ML suspension Use 5 ml (cc) three times a day as needed.  240 mL  1  . traMADol (ULTRAM) 50 MG tablet Take 50 mg by mouth every 6 (six) hours as needed.        . traZODone (DESYREL) 50 MG tablet Take 25 mg by mouth at bedtime.       . vitamin B-12 (CYANOCOBALAMIN) 1000 MCG tablet Take 1,000 mcg by mouth daily.      Marland Kitchen zolpidem (AMBIEN) 10 MG tablet Take 10 mg by mouth at bedtime.       . [DISCONTINUED] famotidine (PEPCID) 20 MG tablet Take 20 mg by mouth at bedtime as needed. When coughing      . [DISCONTINUED] LOTEMAX 0.5 % ophthalmic suspension Place 1 drop into the right eye 4 times daily.      Last reviewed on 01/26/2012 12:04 PM by Storm Frisk, MD    Review of Systems  Constitutional:   No  weight loss, night sweats,  Fevers, chills, fatigue, lassitude. HEENT:   No headaches,  Difficulty swallowing,  Tooth/dental problems,  Sore throat,  No sneezing, itching, ear ache,  +nasal congestion, post  nasal drip,   CV:  No chest pain,  Orthopnea, PND, swelling in lower extremities, anasarca, dizziness, palpitations  GI  No heartburn, indigestion, abdominal pain, nausea, vomiting, diarrhea, change in bowel habits, loss of appetite  Resp:  ,  No coughing up of blood.     No chest wall deformity  Skin: no rash or lesions.  GU: no dysuria, change in color of urine, no urgency or frequency.  No flank pain.  MS:  No joint pain or swelling.  No decreased range of motion.  No back pain.  Psych:  No change in mood or affect. No depression or anxiety.  No memory loss.     Objective:   Physical Exam  Filed Vitals:   01/26/12 1156  BP: 140/70  Pulse: 84  Temp: 98.4 F (36.9 C)  TempSrc: Oral  Height: 5\' 4"  (1.626 m)  Weight: 176 lb (79.833 kg)  SpO2: 91%    Gen: mod anxious ambulatory wf nad at rest   ENT: No lesions,  mouth clear,  oropharynx clear, no  postnasal drip Neck: No JVD, no TMG, no carotid bruits  Lungs: No use of accessory muscles, no dullness to percussion, distant BS,   Cardiovascular: RRR, heart sounds normal, no murmur or gallops, no peripheral edema  Abdomen: soft and NT, no HSM,  BS normal  Musculoskeletal: No deformities, no cyanosis or clubbing  Neuro: alert, non focal  Skin: Warm, no lesions or rashes        Assessment & Plan:   COPD Gold C Gold stage C. COPD with recent exacerbation now resolved Plan Maintain inhaled medications as prescribed    Updated Medication List Outpatient Encounter Prescriptions as of 01/26/2012  Medication Sig Dispense Refill  . Aclidinium Bromide (TUDORZA PRESSAIR) 400 MCG/ACT AEPB Inhale 1 puff into the lungs 2 (two) times daily.  1 each  6  . albuterol (PROAIR HFA) 108 (90 BASE) MCG/ACT inhaler Inhale 2 puffs into the lungs every 4 (four) hours as needed.       Marland Kitchen albuterol (PROVENTIL) (2.5 MG/3ML) 0.083% nebulizer solution Take 2.5 mg by nebulization every 4 (four) hours as needed. DX:  496 FILE MCR PART B       . amLODipine (NORVASC) 5 MG tablet Take 5 mg by mouth daily.        . budesonide-formoterol (SYMBICORT) 160-4.5 MCG/ACT inhaler Inhale 2 puffs into the lungs 2 (two) times daily.  1 Inhaler  0  . cetirizine (ZYRTEC) 10 MG tablet Take 10 mg by mouth at bedtime.       . chlorpheniramine-HYDROcodone (TUSSIONEX PENNKINETIC ER) 10-8 MG/5ML LQCR Take 5 mLs by mouth every 12 (twelve) hours as needed.  140 mL  2  . Ciclesonide 37 MCG/ACT AERS Place 2 puffs into the nose daily as needed.       . Cinnamon 500 MG capsule Take 500 mg by mouth daily.      . Coenzyme Q10 (CO Q 10) 10 MG CAPS Take 1 capsule by mouth daily.        Marland Kitchen dextromethorphan-guaiFENesin (MUCINEX DM) 30-600 MG per 12 hr tablet Take 1 tablet by mouth every 12 (twelve) hours as needed.       . diclofenac (VOLTAREN) 75 MG EC tablet Take 75 mg by mouth 2 (two) times daily.        . famotidine (PEPCID) 20 MG tablet Take 2 tablets (40 mg total) by mouth at bedtime.  When coughing  30 tablet  4  . Homeopathic Products ([employer] LEG CRAMPS PAIN RELIEF PO) Per bottle as needed      . hydrochlorothiazide (HYDRODIURIL) 25 MG tablet Take 25 mg by mouth daily.      . Magnesium 400 MG CAPS Take 3 capsules by mouth daily.       . Misc Natural Products (OSTEO BI-FLEX TRIPLE STRENGTH PO) Take 1 tablet by mouth daily.        Marland Kitchen NEXIUM 40 MG capsule Take 1 capsule by mouth Daily.      Marland Kitchen nystatin (MYCOSTATIN) 100000 UNIT/ML suspension Use 5 ml (cc) three times a day as needed.  240 mL  1  . traMADol (ULTRAM) 50 MG tablet Take 50 mg by mouth every 6 (six) hours as needed.        . traZODone (DESYREL) 50 MG tablet Take 25 mg by mouth at bedtime.       . vitamin B-12 (CYANOCOBALAMIN) 1000 MCG tablet Take 1,000 mcg by mouth daily.      Marland Kitchen zolpidem (AMBIEN) 10 MG tablet Take 10 mg by mouth at bedtime.       . [DISCONTINUED] famotidine (PEPCID) 20 MG tablet Take 20 mg by mouth at bedtime as needed. When coughing      . [DISCONTINUED] LOTEMAX 0.5 % ophthalmic  suspension Place 1 drop into the right eye 4 times daily.

## 2012-01-27 NOTE — Assessment & Plan Note (Signed)
Gold stage C. COPD with recent exacerbation now resolved Plan Maintain inhaled medications as prescribed

## 2012-01-29 ENCOUNTER — Telehealth: Payer: Self-pay | Admitting: Critical Care Medicine

## 2012-01-29 ENCOUNTER — Other Ambulatory Visit: Payer: Self-pay | Admitting: Critical Care Medicine

## 2012-01-29 MED ORDER — OSELTAMIVIR PHOSPHATE 75 MG PO CAPS: 75.0000 mg | ORAL_CAPSULE | Freq: Two times a day (BID) | ORAL | Status: DC

## 2012-01-29 MED ORDER — NYSTATIN 100000 UNIT/ML MT SUSP: OROMUCOSAL | Status: DC

## 2012-01-29 MED ORDER — MOXIFLOXACIN HCL 400 MG PO TABS: 400.0000 mg | ORAL_TABLET | Freq: Every day | ORAL | Status: DC

## 2012-01-29 NOTE — Telephone Encounter (Signed)
This is ok

## 2012-01-29 NOTE — Telephone Encounter (Signed)
rx sent

## 2012-01-29 NOTE — Telephone Encounter (Signed)
I spoke with pt and is aware of PW recs. She voiced her understanding and needed nothing further. RX's has been sent to the pharmacy.

## 2012-01-29 NOTE — Telephone Encounter (Signed)
Received faxed refill request for nystatin suspension.  Use 5 mL tid prn.  Rx was last given on 10/13/11 # 240 mL x 1.  Dr. Delford Field, pls advise if rx ok.  Thank you.

## 2012-01-29 NOTE — Telephone Encounter (Signed)
rx avelox 400mg  /d x 7days Tamiflu 75mg  bid x 5 days

## 2012-01-29 NOTE — Telephone Encounter (Signed)
Called spoke with patient who c/o prod cough with green mucus, some wheezing, fever up to 101 last night, some tightness in chest, some discomfort in her bronchials and sinus congestion.  Symptoms worsened gradually since 12.10.13 ov.  [facility] Allergies  Allergen Reactions  . Prednisone     REACTION: agitation, insomina, mood change   Dr Delford Field please advise, thanks.

## 2012-02-11 ENCOUNTER — Other Ambulatory Visit: Payer: Self-pay | Admitting: *Deleted

## 2012-02-11 MED ORDER — BUDESONIDE-FORMOTEROL FUMARATE 160-4.5 MCG/ACT IN AERO: 2.0000 | INHALATION_SPRAY | Freq: Two times a day (BID) | RESPIRATORY_TRACT | Status: DC

## 2012-03-29 ENCOUNTER — Encounter: Payer: Self-pay | Admitting: Critical Care Medicine

## 2012-03-29 ENCOUNTER — Ambulatory Visit (INDEPENDENT_AMBULATORY_CARE_PROVIDER_SITE_OTHER): Payer: Medicare Other | Admitting: Critical Care Medicine

## 2012-03-29 VITALS — BP 150/78 | HR 79 | Temp 98.5°F | Ht 64.0 in | Wt 179.2 lb

## 2012-03-29 DIAGNOSIS — J449 Chronic obstructive pulmonary disease, unspecified: Secondary | ICD-10-CM

## 2012-03-29 MED ORDER — IPRATROPIUM BROMIDE 0.02 % IN SOLN: 500.0000 ug | Freq: Two times a day (BID) | RESPIRATORY_TRACT | Status: DC

## 2012-03-29 NOTE — Assessment & Plan Note (Signed)
Gold stage C. COPD stable at this time Recent exacerbation December 2013 now resolved Plan Ok to stay off Tudorza Stay on nebulizer twice daily Stay on symbicort Return 3 months

## 2012-03-29 NOTE — Progress Notes (Signed)
Subjective:    Patient ID: Kristin Estrada, female    DOB: [birth year], 72 y.o.   MRN: [id number]  HPI  This is a 72 y.o.  WF with hx of Gold C COPD quit smoking in 2003      01/26/2012 Pt got better over symptoms in 11/13. Then noted 4 days of sl drainage, then more over weekend.  Pt then noted more burning in throat over last 24hrs.  Notes some mucus yellow. Pt having to clear throat.  No f/c/s. No real chest pain.  Notes sl wheeze.  No real dyspnea noted.  03/29/2012 Pt feels better. Not much cough now. No chest pain.  Occ mucus, not colored.  Some clearing of throat.  Notes some heartburn. Pt did not tolerate tudorza.  Now on atrovent /albuterol in neb bid and symbicort.  Pt did get tamiflu/avelox 01/28/13 d/t acute bronchitis.     Past Medical History  Diagnosis Date  . Acute bronchitis   . Unspecified essential hypertension   . Esophageal reflux   . Chronic airway obstruction, not elsewhere classified      Family History  Problem Relation Age of Onset  . Heart disease       History   Social History  . Marital Status: Widowed    Spouse Name: N/A    Number of Children: N/A  . Years of Education: N/A   Occupational History  . Retired    Social History Main Topics  . Smoking status: Former Smoker -- 2.00 packs/day for 30 years    Types: Cigarettes    Quit date: 02/16/2001  . Smokeless tobacco: Never Used     Comment: 2ppd x 30 years  . Alcohol Use: Not on file  . Drug Use: Not on file  . Sexually Active: Not on file   Other Topics Concern  . Not on file   Social History Narrative  . No narrative on file     Allergies  Allergen Reactions  . Prednisone     REACTION: agitation, insomina, mood change     Outpatient Prescriptions Prior to Visit  Medication Sig Dispense Refill  . albuterol (PROAIR HFA) 108 (90 BASE) MCG/ACT inhaler Inhale 2 puffs into the lungs every 4 (four) hours as needed.       Marland Kitchen albuterol (PROVENTIL) (2.5 MG/3ML) 0.083% nebulizer  solution Take 2.5 mg by nebulization 2 (two) times daily. And as needed DX:  496 FILE MCR PART B      . amLODipine (NORVASC) 5 MG tablet Take 5 mg by mouth daily.        . budesonide-formoterol (SYMBICORT) 160-4.5 MCG/ACT inhaler Inhale 2 puffs into the lungs 2 (two) times daily.  1 Inhaler  5  . cetirizine (ZYRTEC) 10 MG tablet Take 10 mg by mouth at bedtime.       . chlorpheniramine-HYDROcodone (TUSSIONEX PENNKINETIC ER) 10-8 MG/5ML LQCR Take 5 mLs by mouth every 12 (twelve) hours as needed.  140 mL  2  . Ciclesonide 37 MCG/ACT AERS Place 2 puffs into the nose daily as needed.       . Cinnamon 500 MG capsule Take 500 mg by mouth daily.      . Coenzyme Q10 (CO Q 10) 10 MG CAPS Take 1 capsule by mouth daily.        Marland Kitchen dextromethorphan-guaiFENesin (MUCINEX DM) 30-600 MG per 12 hr tablet Take 1 tablet by mouth every 12 (twelve) hours as needed.       . diclofenac (VOLTAREN) 75  MG EC tablet Take 75 mg by mouth 2 (two) times daily.        . Homeopathic Products ([employer] LEG CRAMPS PAIN RELIEF PO) Per bottle as needed      . hydrochlorothiazide (HYDRODIURIL) 25 MG tablet Take 25 mg by mouth daily.      . Magnesium 400 MG CAPS Take 3 capsules by mouth daily.       . Misc Natural Products (OSTEO BI-FLEX TRIPLE STRENGTH PO) Take 1 tablet by mouth daily.        Marland Kitchen NEXIUM 40 MG capsule Take 1 capsule by mouth Daily.      Marland Kitchen nystatin (MYCOSTATIN) 100000 UNIT/ML suspension Use 5 ml (cc) three times a day as needed.  240 mL  1  . traMADol (ULTRAM) 50 MG tablet Take 50 mg by mouth every 6 (six) hours as needed.        . traZODone (DESYREL) 50 MG tablet Take 25 mg by mouth at bedtime.       . vitamin B-12 (CYANOCOBALAMIN) 1000 MCG tablet Take 1,000 mcg by mouth daily.      Marland Kitchen zolpidem (AMBIEN) 10 MG tablet Take 10 mg by mouth at bedtime.       . famotidine (PEPCID) 20 MG tablet Take 2 tablets (40 mg total) by mouth at bedtime. When coughing  30 tablet  4  . Aclidinium Bromide (TUDORZA PRESSAIR) 400 MCG/ACT AEPB  Inhale 1 puff into the lungs 2 (two) times daily.  1 each  6  . moxifloxacin (AVELOX) 400 MG tablet Take 1 tablet (400 mg total) by mouth daily.  7 tablet  0  . oseltamivir (TAMIFLU) 75 MG capsule Take 1 capsule (75 mg total) by mouth 2 (two) times daily.  10 capsule  0   No facility-administered medications prior to visit.      Review of Systems  Constitutional:   No  weight loss, night sweats,  Fevers, chills, fatigue, lassitude. HEENT:   No headaches,  Difficulty swallowing,  Tooth/dental problems,  Sore throat,                No sneezing, itching, ear ache,  +nasal congestion, post nasal drip,   CV:  No chest pain,  Orthopnea, PND, swelling in lower extremities, anasarca, dizziness, palpitations  GI  No heartburn, indigestion, abdominal pain, nausea, vomiting, diarrhea, change in bowel habits, loss of appetite  Resp:  ,  No coughing up of blood.     No chest wall deformity  Skin: no rash or lesions.  GU: no dysuria, change in color of urine, no urgency or frequency.  No flank pain.  MS:  No joint pain or swelling.  No decreased range of motion.  No back pain.  Psych:  No change in mood or affect. No depression or anxiety.  No memory loss.     Objective:   Physical Exam  Filed Vitals:   03/29/12 1155  BP: 150/78  Pulse: 79  Temp: 98.5 F (36.9 C)  TempSrc: Oral  Height: 5\' 4"  (1.626 m)  Weight: 179 lb 3.2 oz (81.285 kg)  SpO2: 95%    Gen: mod anxious ambulatory wf nad at rest   ENT: No lesions,  mouth clear,  oropharynx clear, no  postnasal drip Neck: No JVD, no TMG, no carotid bruits  Lungs: No use of accessory muscles, no dullness to percussion, distant BS,   Cardiovascular: RRR, heart sounds normal, no murmur or gallops, no peripheral edema  Abdomen: soft and NT,  no HSM,  BS normal  Musculoskeletal: No deformities, no cyanosis or clubbing  Neuro: alert, non focal  Skin: Warm, no lesions or rashes        Assessment & Plan:   COPD Gold C Gold  stage C. COPD stable at this time Recent exacerbation December 2013 now resolved Plan Ok to stay off Tudorza Stay on nebulizer twice daily Stay on symbicort Return 3 months     Updated Medication List Outpatient Encounter Prescriptions as of 03/29/2012  Medication Sig Dispense Refill  . albuterol (PROAIR HFA) 108 (90 BASE) MCG/ACT inhaler Inhale 2 puffs into the lungs every 4 (four) hours as needed.       Marland Kitchen albuterol (PROVENTIL) (2.5 MG/3ML) 0.083% nebulizer solution Take 2.5 mg by nebulization 2 (two) times daily. And as needed DX:  496 FILE MCR PART B      . amLODipine (NORVASC) 5 MG tablet Take 5 mg by mouth daily.        . budesonide-formoterol (SYMBICORT) 160-4.5 MCG/ACT inhaler Inhale 2 puffs into the lungs 2 (two) times daily.  1 Inhaler  5  . cetirizine (ZYRTEC) 10 MG tablet Take 10 mg by mouth at bedtime.       . chlorpheniramine-HYDROcodone (TUSSIONEX PENNKINETIC ER) 10-8 MG/5ML LQCR Take 5 mLs by mouth every 12 (twelve) hours as needed.  140 mL  2  . Ciclesonide 37 MCG/ACT AERS Place 2 puffs into the nose daily as needed.       . Cinnamon 500 MG capsule Take 500 mg by mouth daily.      . Coenzyme Q10 (CO Q 10) 10 MG CAPS Take 1 capsule by mouth daily.        Marland Kitchen dextromethorphan-guaiFENesin (MUCINEX DM) 30-600 MG per 12 hr tablet Take 1 tablet by mouth every 12 (twelve) hours as needed.       . diclofenac (VOLTAREN) 75 MG EC tablet Take 75 mg by mouth 2 (two) times daily.        . Homeopathic Products ([employer] LEG CRAMPS PAIN RELIEF PO) Per bottle as needed      . hydrochlorothiazide (HYDRODIURIL) 25 MG tablet Take 25 mg by mouth daily.      Marland Kitchen ipratropium (ATROVENT) 0.02 % nebulizer solution Take 2.5 mLs (500 mcg total) by nebulization 2 (two) times daily. And as needed  120 mL  6  . Magnesium 400 MG CAPS Take 3 capsules by mouth daily.       . Misc Natural Products (OSTEO BI-FLEX TRIPLE STRENGTH PO) Take 1 tablet by mouth daily.        Marland Kitchen NEXIUM 40 MG capsule Take 1 capsule by  mouth Daily.      Marland Kitchen nystatin (MYCOSTATIN) 100000 UNIT/ML suspension Use 5 ml (cc) three times a day as needed.  240 mL  1  . traMADol (ULTRAM) 50 MG tablet Take 50 mg by mouth every 6 (six) hours as needed.        . traZODone (DESYREL) 50 MG tablet Take 25 mg by mouth at bedtime.       . vitamin B-12 (CYANOCOBALAMIN) 1000 MCG tablet Take 1,000 mcg by mouth daily.      . vitamin C (ASCORBIC ACID) 500 MG tablet Take 500 mg by mouth daily.      Marland Kitchen zolpidem (AMBIEN) 10 MG tablet Take 10 mg by mouth at bedtime.       . [DISCONTINUED] ipratropium (ATROVENT) 0.02 % nebulizer solution Take 500 mcg by nebulization 2 (two) times daily.  And as needed      . famotidine (PEPCID) 20 MG tablet Take 2 tablets (40 mg total) by mouth at bedtime. When coughing  30 tablet  4  . [DISCONTINUED] Aclidinium Bromide (TUDORZA PRESSAIR) 400 MCG/ACT AEPB Inhale 1 puff into the lungs 2 (two) times daily.  1 each  6  . [DISCONTINUED] moxifloxacin (AVELOX) 400 MG tablet Take 1 tablet (400 mg total) by mouth daily.  7 tablet  0  . [DISCONTINUED] oseltamivir (TAMIFLU) 75 MG capsule Take 1 capsule (75 mg total) by mouth 2 (two) times daily.  10 capsule  0   No facility-administered encounter medications on file as of 03/29/2012.

## 2012-03-29 NOTE — Patient Instructions (Addendum)
No change in medications Ok to stay off Tudorza Stay on nebulizer twice daily Stay on symbicort Return 3 months

## 2012-04-20 ENCOUNTER — Encounter: Payer: Self-pay | Admitting: Internal Medicine

## 2012-04-20 ENCOUNTER — Ambulatory Visit (INDEPENDENT_AMBULATORY_CARE_PROVIDER_SITE_OTHER): Payer: Medicare Other | Admitting: Internal Medicine

## 2012-04-20 VITALS — BP 132/70 | HR 80 | Temp 98.2°F | Ht 64.0 in | Wt 175.8 lb

## 2012-04-20 DIAGNOSIS — J449 Chronic obstructive pulmonary disease, unspecified: Secondary | ICD-10-CM

## 2012-04-20 MED ORDER — PREDNISONE (PAK) 10 MG PO TABS: ORAL_TABLET | ORAL | Status: DC

## 2012-04-20 MED ORDER — AMOXICILLIN-POT CLAVULANATE 875-125 MG PO TABS: 1.0000 | ORAL_TABLET | Freq: Two times a day (BID) | ORAL | Status: DC

## 2012-04-20 NOTE — Assessment & Plan Note (Addendum)
PFTs 11/2005 FEV1  1.40 (67%) ratio 61  GOLD III with acute exac and overuse of B2 at baseline with poor control of symptoms  DDX of  difficult airways managment all start with A and  include Adherence, Ace Inhibitors, Acid Reflux, Active Sinus Disease, Alpha 1 Antitripsin deficiency, Anxiety masquerading as Airways dz,  ABPA,  allergy(esp in young), Aspiration (esp in elderly), Adverse effects of DPI,  Active smokers, plus two Bs  = Bronchiectasis and Beta blocker use..and one C= CHF  Adherence is always the initial "prime suspect" and is a multilayered concern that requires a "trust but verify" approach in every patient - starting with knowing how to use medications, especially inhalers, correctly, keeping up with refills and understanding the fundamental difference between maintenance and prns vs those medications only taken for a very short course and then stopped and not refilled. The proper method of use, as well as anticipated side effects, of a metered-dose inhaler are discussed and demonstrated to the patient. Improved effectiveness after extensive coaching during this visit to a level of approximately  75% at best.  ? Acid reflux >  Max rx  ? Sinus Dz , occult >  augmentin x 10 days, consider sinus ct if continues with purulent sputum.

## 2012-04-20 NOTE — Patient Instructions (Addendum)
Work on inhaler technique:  relax and gently blow all the way out then take a nice smooth deep breath back in, triggering the inhaler at same time you start breathing in.  Hold for up to 5 seconds if you can.  Rinse and gargle with water when done   If your mouth or throat starts to bother you,   I suggest you time the inhaler to your dental care and after using the inhaler(s) brush teeth and tongue with a baking soda containing toothpaste and when you rinse this out, gargle with it first to see if this helps your mouth and throat.     augmentin 875 mg twice daily x 10 days (take with glass of water with bfast and supper and eat yogurt for lunch)  Prednisone 10 mg take  4 each am x 2 days,   2 each am x 2 days,  1 each am x2days and stop  If better to your satisfaction try to reduce your nebulizer use as follows  Only use your albuterol (plan B= backup, your proaire and plan c is Nebulizer ) as a rescue medication to be used if you can't catch your breath by resting or doing a relaxed purse lip breathing pattern. The less you use it, the better it will work when you need it. You can use either up to every 4 hours with goal is less than a couple times a week.  Keep appt with Dr Delford Field, call sooner if needed.

## 2012-04-20 NOTE — Progress Notes (Signed)
Subjective:    Patient ID: Kristin Estrada, female    DOB: [birth year], 72 y.o.   MRN: [id number]  HPI This is a 72 y.o.  WF with hx of Gold C COPD quit smoking in 2003      07/03/11 Wert/ Acute w/in visit cc abuptly  felt worse with fever / yellow mucus 5/11 then started avelox 5/13 cc  prod cough with large amount of yellow sputum and increased SOB for the past x 5 days, jittery from nebulizer this am but did help breathing. No fever, no cp or sore throat nausea or vomiting/ leg swelling. Mucus turning lighter yellow but still difficult to cough up.  e   10/14/2011 Pt worse since 5/13.  Pt had gone off symbicort and went to spiriva.  Using albuterol in neb med. Dyspnea is better on perforomist and pulmicort but still having mouth issues.  Also on spiriva Was worse off symbicort.  Pt had shingles vaccine 7/19:  Red yeast rice rx for HL>>reaction and stopped.    11/03/2011 At last ov we rx avelox and stopped pulmicort. Pt still with issues and now off perforomist and spiriva and now on symbicort alone. Now has productive cough and is wheezing.  Pt coughing beige mucus.  Notes some pndrip. Pt notes side of throat is sore.  No real chest pain.  No edema in feet.  11/25/2011 Now is better, less cough and wheezing.  No beige mucus than as before.  Sore throat occasionally, but better. The [name] has helped.  Also still on symbicort. Also using albuerol tid in neb med. >>no changes   11/6 Acute OV  Complains of chest/sinus congestion, cough with some yellow/white thick sputum for 3 days. Minimal wheezing . No chest pain , fever, orthopnea or edema.  No n/v.  Congestion is mainly white thick mucus.   01/26/2012 Pt got better over symptoms in 11/13. Then noted 4 days of sl drainage, then more over weekend.  Pt then noted more burning in throat over last 24hrs.  Notes some mucus yellow. Pt having to clear throat.    rec No change in medications Ok to stay off Tudorza Stay on nebulizer twice  daily Stay on symbicort  04/20/2012 f/u ov/Wert cc  congestion, prod cough with large amounts of green to yellow sputum, dull HA, and wheezing- onset was approx 5 days prior to OV.    At baseline using neb at least bid and still very sob with more than slow minimal adls. Much better p prednisone rx typically.  No obvious daytime variabilty or assoc   cp or chest tightness, subjective wheeze overt sinus or hb symptoms. No unusual exp hx  Sleeping ok without nocturnal  or early am exacerbation  of respiratory  c/o's or need for noct saba. Also denies any obvious fluctuation of symptoms with weather or environmental changes or other aggravating or alleviating factors except as outlined above   ROS  The following are not active complaints unless bolded sore throat, dysphagia, dental problems, itching, sneezing,  nasal congestion or excess/ purulent secretions, ear ache,   fever, chills, sweats, unintended wt loss, pleuritic or exertional cp, hemoptysis,  orthopnea pnd or leg swelling, presyncope, palpitations, heartburn, abdominal pain, anorexia, nausea, vomiting, diarrhea  or change in bowel or urinary habits, change in stools or urine, dysuria,hematuria,  rash, arthralgias, visual complaints, headache, numbness weakness or ataxia or problems with walking or coordination,  change in mood/affect or memory.  Past Medical History  Diagnosis Date  . Acute bronchitis   . Unspecified essential hypertension   . Esophageal reflux   . Chronic airway obstruction, not elsewhere classified      Family History  Problem Relation Age of Onset  . Heart disease       History   Social History  . Marital Status: Widowed    Spouse Name: N/A    Number of Children: N/A  . Years of Education: N/A   Occupational History  . Retired    Social History Main Topics  . Smoking status: Former Smoker -- 2.00 packs/day for 30 years    Types: Cigarettes    Quit date: 02/16/2001  . Smokeless tobacco:  Never Used     Comment: 2ppd x 30 years  . Alcohol Use: Not on file  . Drug Use: Not on file  . Sexually Active: Not on file   Other Topics Concern  . Not on file   Social History Narrative  . No narrative on file     Allergies  Allergen Reactions  . Prednisone     REACTION: agitation, insomina, mood change     Outpatient Prescriptions Prior to Visit  Medication Sig Dispense Refill  . albuterol (PROAIR HFA) 108 (90 BASE) MCG/ACT inhaler Inhale 2 puffs into the lungs every 4 (four) hours as needed.       Marland Kitchen albuterol (PROVENTIL) (2.5 MG/3ML) 0.083% nebulizer solution Take 2.5 mg by nebulization 2 (two) times daily. And as needed DX:  496 FILE MCR PART B      . amLODipine (NORVASC) 5 MG tablet Take 5 mg by mouth daily.        . budesonide-formoterol (SYMBICORT) 160-4.5 MCG/ACT inhaler Inhale 2 puffs into the lungs 2 (two) times daily.  1 Inhaler  5  . cetirizine (ZYRTEC) 10 MG tablet Take 10 mg by mouth at bedtime.       . chlorpheniramine-HYDROcodone (TUSSIONEX PENNKINETIC ER) 10-8 MG/5ML LQCR Take 5 mLs by mouth every 12 (twelve) hours as needed.  140 mL  2  . Ciclesonide 37 MCG/ACT AERS Place 2 puffs into the nose daily as needed.       . Cinnamon 500 MG capsule Take 500 mg by mouth daily.      . Coenzyme Q10 (CO Q 10) 10 MG CAPS Take 1 capsule by mouth daily.        Marland Kitchen dextromethorphan-guaiFENesin (MUCINEX DM) 30-600 MG per 12 hr tablet Take 1 tablet by mouth every 12 (twelve) hours as needed.       . diclofenac (VOLTAREN) 75 MG EC tablet Take 75 mg by mouth 2 (two) times daily.        . famotidine (PEPCID) 20 MG tablet Take 2 tablets (40 mg total) by mouth at bedtime. When coughing  30 tablet  4  . Homeopathic Products ([employer] LEG CRAMPS PAIN RELIEF PO) Per bottle as needed      . hydrochlorothiazide (HYDRODIURIL) 25 MG tablet Take 25 mg by mouth daily.      Marland Kitchen ipratropium (ATROVENT) 0.02 % nebulizer solution Take 2.5 mLs (500 mcg total) by nebulization 2 (two) times daily. And as  needed  120 mL  6  . Magnesium 400 MG CAPS Take 3 capsules by mouth daily.       . Misc Natural Products (OSTEO BI-FLEX TRIPLE STRENGTH PO) Take 1 tablet by mouth daily.        Marland Kitchen NEXIUM 40 MG capsule Take 1 capsule by  mouth Daily.      Marland Kitchen nystatin (MYCOSTATIN) 100000 UNIT/ML suspension Use 5 ml (cc) three times a day as needed.  240 mL  1  . traMADol (ULTRAM) 50 MG tablet Take 50 mg by mouth every 6 (six) hours as needed.        . traZODone (DESYREL) 50 MG tablet Take 25 mg by mouth at bedtime.       . vitamin B-12 (CYANOCOBALAMIN) 1000 MCG tablet Take 1,000 mcg by mouth daily.      . vitamin C (ASCORBIC ACID) 500 MG tablet Take 500 mg by mouth daily.      Marland Kitchen zolpidem (AMBIEN) 10 MG tablet Take 10 mg by mouth at bedtime.        No facility-administered medications prior to visit.      Review of Systems       Objective:   Physical Exam Filed Vitals:   04/20/12 1429  BP: 132/70  Pulse: 80  Temp: 98.2 F (36.8 C)  TempSrc: Oral  Height: 5\' 4"  (1.626 m)  Weight: 175 lb 12.8 oz (79.742 kg)  SpO2: 92%    Gen: mod anxious ambulatory wf nad at rest   ENT: No lesions,  mouth clear,  oropharynx clear, no  postnasal drip Neck: No JVD, no TMG, no carotid bruits  Lungs: No use of accessory muscles, no dullness to percussion, distant BS, trace exp wheeze  Cardiovascular: RRR, heart sounds normal, no murmur or gallops, no peripheral edema  Abdomen: soft and NT, no HSM,  BS normal  Musculoskeletal: No deformities, no cyanosis or clubbing  Neuro: alert, non focal  Skin: Warm, no lesions or rashes        Assessment & Plan:   No problem-specific assessment & plan notes found for this encounter.   Updated Medication List Outpatient Encounter Prescriptions as of 04/20/2012  Medication Sig Dispense Refill  . albuterol (PROAIR HFA) 108 (90 BASE) MCG/ACT inhaler Inhale 2 puffs into the lungs every 4 (four) hours as needed.       Marland Kitchen albuterol (PROVENTIL) (2.5 MG/3ML) 0.083%  nebulizer solution Take 2.5 mg by nebulization 2 (two) times daily. And as needed DX:  496 FILE MCR PART B      . amLODipine (NORVASC) 5 MG tablet Take 5 mg by mouth daily.        . budesonide-formoterol (SYMBICORT) 160-4.5 MCG/ACT inhaler Inhale 2 puffs into the lungs 2 (two) times daily.  1 Inhaler  5  . cetirizine (ZYRTEC) 10 MG tablet Take 10 mg by mouth at bedtime.       . chlorpheniramine-HYDROcodone (TUSSIONEX PENNKINETIC ER) 10-8 MG/5ML LQCR Take 5 mLs by mouth every 12 (twelve) hours as needed.  140 mL  2  . Ciclesonide 37 MCG/ACT AERS Place 2 puffs into the nose daily as needed.       . Cinnamon 500 MG capsule Take 500 mg by mouth daily.      . Coenzyme Q10 (CO Q 10) 10 MG CAPS Take 1 capsule by mouth daily.        Marland Kitchen dextromethorphan-guaiFENesin (MUCINEX DM) 30-600 MG per 12 hr tablet Take 1 tablet by mouth every 12 (twelve) hours as needed.       . diclofenac (VOLTAREN) 75 MG EC tablet Take 75 mg by mouth 2 (two) times daily.        . famotidine (PEPCID) 20 MG tablet Take 2 tablets (40 mg total) by mouth at bedtime. When coughing  30 tablet  4  .  Homeopathic Products (CVS LEG CRAMPS PAIN RELIEF PO) Per bottle as needed      . hydrochlorothiazide (HYDRODIURIL) 25 MG tablet Take 25 mg by mouth daily.      Marland Kitchen ipratropium (ATROVENT) 0.02 % nebulizer solution Take 2.5 mLs (500 mcg total) by nebulization 2 (two) times daily. And as needed  120 mL  6  . Magnesium 400 MG CAPS Take 3 capsules by mouth daily.       . Misc Natural Products (OSTEO BI-FLEX TRIPLE STRENGTH PO) Take 1 tablet by mouth daily.        Marland Kitchen NEXIUM 40 MG capsule Take 1 capsule by mouth Daily.      Marland Kitchen nystatin (MYCOSTATIN) 100000 UNIT/ML suspension Use 5 ml (cc) three times a day as needed.  240 mL  1  . traMADol (ULTRAM) 50 MG tablet Take 50 mg by mouth every 6 (six) hours as needed.        . traZODone (DESYREL) 50 MG tablet Take 25 mg by mouth at bedtime.       . vitamin B-12 (CYANOCOBALAMIN) 1000 MCG tablet Take 1,000 mcg  by mouth daily.      . vitamin C (ASCORBIC ACID) 500 MG tablet Take 500 mg by mouth daily.      Marland Kitchen zolpidem (AMBIEN) 10 MG tablet Take 10 mg by mouth at bedtime.        No facility-administered encounter medications on file as of 04/20/2012.

## 2012-05-08 ENCOUNTER — Other Ambulatory Visit: Payer: Self-pay | Admitting: Critical Care Medicine

## 2012-05-11 ENCOUNTER — Telehealth: Payer: Self-pay | Admitting: Critical Care Medicine

## 2012-05-11 MED ORDER — ALBUTEROL SULFATE (2.5 MG/3ML) 0.083% IN NEBU: INHALATION_SOLUTION | RESPIRATORY_TRACT | Status: DC

## 2012-05-11 NOTE — Telephone Encounter (Signed)
I spoke with [facility]. Was advised pt RX was "accidentally deleted and needed this re-semt. I advised will re-fax this over. Nothing further was needed

## 2012-07-04 ENCOUNTER — Other Ambulatory Visit: Payer: Self-pay | Admitting: *Deleted

## 2012-07-04 MED ORDER — NYSTATIN 100000 UNIT/ML MT SUSP: OROMUCOSAL | Status: DC

## 2012-09-23 ENCOUNTER — Telehealth: Payer: Self-pay | Admitting: *Deleted

## 2012-09-23 MED ORDER — HYDROCOD POLST-CHLORPHEN POLST 10-8 MG/5ML PO LQCR: 5.0000 mL | Freq: Two times a day (BID) | ORAL | Status: DC | PRN

## 2012-09-23 NOTE — Telephone Encounter (Signed)
Per CDY okay to refill this one time. RX has been called in. Nothing further was needed. Will forward to Dr. Delford Field as an Lorain Childes

## 2012-09-23 NOTE — Telephone Encounter (Signed)
Received paper request for tussionex cough syrup. This last refilled 06/2011 #140 x 2 refills Last OV 04/20/12 Pending 10/12/12 Since PW is scheduled off for another week will forward to Dr. Maple Hudson to see if okay to refill. Please advise thanks  Allergies  Allergen Reactions  . Prednisone     REACTION: agitation, insomina, mood change

## 2012-09-24 NOTE — Telephone Encounter (Signed)
noted 

## 2012-10-03 ENCOUNTER — Encounter: Payer: Self-pay | Admitting: Internal Medicine

## 2012-10-03 ENCOUNTER — Ambulatory Visit (INDEPENDENT_AMBULATORY_CARE_PROVIDER_SITE_OTHER): Payer: Self-pay | Admitting: Internal Medicine

## 2012-10-03 ENCOUNTER — Ambulatory Visit (INDEPENDENT_AMBULATORY_CARE_PROVIDER_SITE_OTHER)
Admission: RE | Admit: 2012-10-03 | Discharge: 2012-10-03 | Disposition: A | Discharge status: Home / Self Care | Payer: Medicare Other | Source: Ambulatory Visit | Attending: Internal Medicine | Admitting: Internal Medicine

## 2012-10-03 VITALS — BP 136/82 | HR 86 | Temp 97.2°F | Ht 63.0 in | Wt 173.0 lb

## 2012-10-03 DIAGNOSIS — J449 Chronic obstructive pulmonary disease, unspecified: Secondary | ICD-10-CM

## 2012-10-03 DIAGNOSIS — R05 Cough: Secondary | ICD-10-CM

## 2012-10-03 DIAGNOSIS — J4489 Other specified chronic obstructive pulmonary disease: Secondary | ICD-10-CM

## 2012-10-03 NOTE — Progress Notes (Signed)
Subjective:    Patient ID: Kristin Estrada, female    DOB: [birth year], 72 y.o.   MRN: [id number]  HPI This is a 72 y.o.  WF with hx of Gold C COPD quit smoking in 2003      07/03/11 Kristin Estrada/ Acute w/in visit cc abuptly  felt worse with fever / yellow mucus 5/11 then started avelox 5/13 cc  prod cough with large amount of yellow sputum and increased SOB for the past x 5 days, jittery from nebulizer this am but did help breathing. No fever, no cp or sore throat nausea or vomiting/ leg swelling. Mucus turning lighter yellow but still difficult to cough up.  e   10/14/2011 Pt worse since 5/13.  Pt had gone off symbicort and went to spiriva.  Using albuterol in neb med. Dyspnea is better on perforomist and pulmicort but still having mouth issues.  Also on spiriva Was worse off symbicort.  Pt had shingles vaccine 7/19:  Red yeast rice rx for HL>>reaction and stopped.    11/03/2011 At last ov we rx avelox and stopped pulmicort. Pt still with issues and now off perforomist and spiriva and now on symbicort alone. Now has productive cough and is wheezing.  Pt coughing beige mucus.  Notes some pndrip. Pt notes side of throat is sore.  No real chest pain.  No edema in feet.  11/25/2011 Now is better, less cough and wheezing.  No beige mucus than as before.  Sore throat occasionally, but better. The Kristin Estrada has helped.  Also still on symbicort. Also using albuerol tid in neb med. >>no changes   11/6 Acute OV  Complains of chest/sinus congestion, cough with some yellow/white thick sputum for 3 days. Minimal wheezing . No chest pain , fever, orthopnea or edema.  No n/v.  Congestion is mainly white thick mucus.   01/26/2012 Pt got better over symptoms in 11/13. Then noted 4 days of sl drainage, then more over weekend.  Pt then noted more burning in throat over last 24hrs.  Notes some mucus yellow. Pt having to clear throat.    rec No change in medications Ok to stay off Tudorza Stay on nebulizer twice  daily Stay on symbicort  04/20/2012 f/u ov/Kristin Estrada cc  congestion, prod cough with large amounts of green to yellow sputum, dull HA, and wheezing- onset was approx 5 days prior to OV.    At baseline using neb at least bid and still very sob with more than slow minimal adls. Much better p prednisone rx typically. rec Work on inhaler technique:  relax and gently blow all the way out then take a nice smooth deep breath back in, triggering the inhaler at same time you start breathing in.  Hold for up to 5 seconds if you can.  Rinse and gargle with water when done   If your mouth or throat starts to bother you,   I suggest you time the inhaler to your dental care and after using the inhaler(s) brush teeth and tongue with a baking soda containing toothpaste and when you rinse this out, gargle with it first to see if this helps your mouth and throat.     augmentin 875 mg twice daily x 10 days (take with glass of water with bfast and supper and eat yogurt for lunch)  Prednisone 10 mg take  4 each am x 2 days,   2 each am x 2 days,  1 each am x2days and stop  If better to your  satisfaction try to reduce your nebulizer use as follows  Only use your albuterol (plan B= backup, your proaire and plan c is Nebulizer ) as a rescue medication to be used if you can't catch your breath by resting or doing a relaxed purse lip breathing pattern. The less you use it, the better it will work when you need it. You can use either up to every 4 hours with goal is less than a couple times a week.    10/03/2012 acute  ov/Kristin Estrada re excess mucus Chief Complaint  Patient presents with  . Acute Visit    pt reports increased mucus in throat x 2 weeks-- thick mucus awakens her at night and makes her feel like she is choking  mucus is white and thick and extremely low vol  and not better p augmentin and mucinex, not feeling she needs any saba. Did not start up the pepcid at hs as per instructions Better p mucinex sinus, already on  zyrtec Feels like choking on mucus, peaks about 10 pm and hs  and doesn't wake her in am    No obvious daytime variabilty or assoc sob  cp or chest tightness, subjective wheeze overt sinus or hb symptoms. No unusual exp hx  Sleeping ok without nocturnal  or early am exacerbation  of respiratory  c/o's or need for noct saba. Also denies any obvious fluctuation of symptoms with weather or environmental changes or other aggravating or alleviating factors except as outlined above   Current Medications, Allergies, Past Medical History, Past Surgical History, Family History, and Social History were reviewed in [employer] record.  ROS  The following are not active complaints unless bolded sore throat, dysphagia, dental problems, itching, sneezing,  nasal congestion or excess/ purulent secretions, ear ache,   fever, chills, sweats, unintended wt loss, pleuritic or exertional cp, hemoptysis,  orthopnea pnd or leg swelling, presyncope, palpitations, heartburn, abdominal pain, anorexia, nausea, vomiting, diarrhea  or change in bowel or urinary habits, change in stools or urine, dysuria,hematuria,  rash, arthralgias, visual complaints, headache, numbness weakness or ataxia or problems with walking or coordination,  change in mood/affect or memory.               Past Medical History  Diagnosis Date  . Acute bronchitis   . Unspecified essential hypertension   . Esophageal reflux   . Chronic airway obstruction, not elsewhere classified      Family History  Problem Relation Age of Onset  . Heart disease       History   Social History  . Marital Status: Widowed    Spouse Name: N/A    Number of Children: N/A  . Years of Education: N/A   Occupational History  . Retired    Social History Main Topics  . Smoking status: Former Smoker -- 2.00 packs/day for 30 years    Types: Cigarettes    Quit date: 02/16/2001  . Smokeless tobacco: Never Used     Comment: 2ppd x 30  years  . Alcohol Use: Not on file  . Drug Use: Not on file  . Sexually Active: Not on file   Other Topics Concern  . Not on file   Social History Narrative  . No narrative on file     Allergies  Allergen Reactions  . Prednisone     REACTION: agitation, insomina, mood change           Objective:   Physical Exam   Gen:  mod anxious ambulatory wf nad at rest / chest sounds clear with cough, not rattling at all   Wt Readings from Last 3 Encounters:  10/03/12 173 lb (78.472 kg)  04/20/12 175 lb 12.8 oz (79.742 kg)  03/29/12 179 lb 3.2 oz (81.285 kg)     ENT: No lesions,  mouth clear,  oropharynx clear, no  postnasal drip - edentulous Neck: No JVD, no TMG, no carotid bruits  Lungs: No use of accessory muscles, no dullness to percussion, distant BS no wheeze  Cardiovascular: RRR, heart sounds normal, no murmur or gallops, no peripheral edema  Abdomen: soft and NT, no HSM,  BS normal  Musculoskeletal: No deformities, no cyanosis or clubbing  Neuro: alert, non focal  Skin: Warm, no lesions or rashes      CXR  10/03/2012 :  1. No acute cardiopulmonary abnormalities.   Assessment & Plan:

## 2012-10-03 NOTE — Patient Instructions (Addendum)
Continue symbicort Take 2 puffs first thing in am and then another 2 puffs about 12 hours later and blow it out througth the nose   Stop zyrtec and take chlortrimeton 4 mg in evening when can afford to be sleeping - ok also to conitnue mucinex sinus as needed   Please remember to go to the  x-ray department downstairs for your tests - we will call you with the results when they are available.  Keep appt to see Dr Delford Field - call sooner if needed

## 2012-10-04 NOTE — Progress Notes (Signed)
Quick Note:  Spoke with patient, made patient aware of results/recs as listed below  Patient verbalized understanding and nothing further needed at this time ______

## 2012-10-05 DIAGNOSIS — R05 Cough: Secondary | ICD-10-CM | POA: Insufficient documentation

## 2012-10-05 NOTE — Assessment & Plan Note (Signed)
Most likely this is not copd exac but  Classic Upper airway cough syndrome, so named because it's frequently impossible to sort out how much is  CR/sinusitis with freq throat clearing (which can be related to primary GERD)   vs  causing  secondary (" extra esophageal")  GERD from wide swings in gastric pressure that occur with throat clearing, often  promoting self use of mint and menthol lozenges that reduce the lower esophageal sphincter tone and exacerbate the problem further in a cyclical fashion.   These are the same pts (now being labeled as having "irritable larynx syndrome" by some cough centers) who not infrequently have a history of having failed to tolerate ace inhibitors,  dry powder inhalers or biphosphonates or report having atypical reflux symptoms that don't respond to standard doses of PPI , and are easily confused as having aecopd or asthma flares by even experienced allergists/ pulmonologists.   For now add hs h1 and h2 and then regroup

## 2012-10-05 NOTE — Assessment & Plan Note (Signed)
DDX of  difficult airways managment all start with A and  include Adherence, Ace Inhibitors, Acid Reflux, Active Sinus Disease, Alpha 1 Antitripsin deficiency, Anxiety masquerading as Airways dz,  ABPA,  allergy(esp in young), Aspiration (esp in elderly), Adverse effects of DPI,  Active smokers, plus two Bs  = Bronchiectasis and Beta blocker use..and one C= CHF  Adherence is always the initial "prime suspect" and is a multilayered concern that requires a "trust but verify" approach in every patient - starting with knowing how to use medications, especially inhalers, correctly, keeping up with refills and understanding the fundamental difference between maintenance and prns vs those medications only taken for a very short course and then stopped and not refilled. The proper method of use, as well as anticipated side effects, of a metered-dose inhaler are discussed and demonstrated to the patient. Improved effectiveness after extensive coaching during this visit to a level of approximately  75% so continue symbicort 160 2bid  ? Acid reflux > max rx  ? Active sinus dz > consider sinus ct next

## 2012-10-12 ENCOUNTER — Encounter: Payer: Self-pay | Admitting: Critical Care Medicine

## 2012-10-12 ENCOUNTER — Ambulatory Visit (INDEPENDENT_AMBULATORY_CARE_PROVIDER_SITE_OTHER): Payer: Medicare Other | Admitting: Critical Care Medicine

## 2012-10-12 VITALS — BP 132/78 | HR 86 | Temp 98.6°F | Ht 64.0 in | Wt 171.8 lb

## 2012-10-12 DIAGNOSIS — J449 Chronic obstructive pulmonary disease, unspecified: Secondary | ICD-10-CM

## 2012-10-12 DIAGNOSIS — R05 Cough: Secondary | ICD-10-CM

## 2012-10-12 MED ORDER — PREDNISONE 10 MG PO TABS: ORAL_TABLET | ORAL | Status: DC

## 2012-10-12 MED ORDER — FLUTTER DEVI: Status: DC

## 2012-10-12 MED ORDER — METHYLPREDNISOLONE ACETATE 80 MG/ML IJ SUSP
120.0000 mg | Freq: Once | INTRAMUSCULAR | Status: AC
  Administered 2012-10-12: 120 mg via INTRAMUSCULAR

## 2012-10-12 MED ORDER — MOXIFLOXACIN HCL 400 MG PO TABS: 400.0000 mg | ORAL_TABLET | Freq: Every day | ORAL | Status: DC

## 2012-10-12 NOTE — Progress Notes (Signed)
Subjective:    Patient ID: Kristin Estrada, female    DOB: Oct 01, 1940, 72 y.o.   MRN: [id number]  HPI his is a 72 y.o.  WF with hx of Gold C COPD quit smoking in 2003       10/12/2012 Chief Complaint  Patient presents with  . 6 month follow up    Breathing well since OV.  Coughing with lots of mucus, thick and white in color x 1 month  Pt was doing well, until two weeks ago, developed more cough Mucus is thick and difficult to raise  Mucus is white and thick. Rx chlorpheniramine 4mg  every 4hours, and stay on mucinex. No pred or ABX Pt is unchanged      Review of Systems Constitutional:   No  weight loss, night sweats,  Fevers, chills, fatigue, lassitude. HEENT:   No headaches,  Difficulty swallowing,  Tooth/dental problems,  Sore throat,                No sneezing, itching, ear ache, nasal congestion, post nasal drip,   CV:  No chest pain,  Orthopnea, PND, swelling in lower extremities, anasarca, dizziness, palpitations  GI  No heartburn, indigestion, abdominal pain, nausea, vomiting, diarrhea, change in bowel habits, loss of appetite  Resp: Notes  shortness of breath with exertion not at rest.  Notes  excess mucus, notes productive cough,  No non-productive cough,  No coughing up of blood.  No change in color of mucus.  No wheezing.  No chest wall deformity  Skin: no rash or lesions.  GU: no dysuria, change in color of urine, no urgency or frequency.  No flank pain.  MS:  No joint pain or swelling.  No decreased range of motion.  No back pain.  Psych:  No change in mood or affect. No depression or anxiety.  No memory loss.     Objective:   Physical Exam Filed Vitals:   10/12/12 1603  BP: 132/78  Pulse: 86  Temp: 98.6 F (37 C)  TempSrc: Oral  Height: 5\' 4"  (1.626 m)  Weight: 171 lb 12.8 oz (77.928 kg)  SpO2: 96%    Gen: Pleasant, well-nourished, in no distress,  normal affect  ENT: No lesions,  mouth clear,  oropharynx clear, no postnasal drip  Neck: No  JVD, no TMG, no carotid bruits  Lungs: No use of accessory muscles, no dullness to percussion, scattered rhonchi  Cardiovascular: RRR, heart sounds normal, no murmur or gallops, no peripheral edema  Abdomen: soft and NT, no HSM,  BS normal  Musculoskeletal: No deformities, no cyanosis or clubbing  Neuro: alert, non focal  Skin: Warm, no lesions or rashes  No results found.        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 Gold stage C. COPD with recurrent exacerbation Plan Flutter valve 4 times daily Prednisone 10mg  Take 4 for three days 3 for three days 2 for three days 1 for three days and stop Avelox one daily for 5 days Stay on symbicort twice daily Return 6 weeks   Updated Medication List Outpatient Encounter Prescriptions as of 10/12/2012  Medication Sig Dispense Refill  . albuterol (PROAIR HFA) 108 (90 BASE) MCG/ACT inhaler Inhale 2 puffs into the lungs every 4 (four) hours as needed.       Marland Kitchen albuterol (PROVENTIL) (2.5 MG/3ML) 0.083% nebulizer solution Take 2.5 mg by nebulization 2 (two) times daily. And as needed  360 mL  2  .  amLODipine (NORVASC) 5 MG tablet Take 5 mg by mouth daily.        . budesonide-formoterol (SYMBICORT) 160-4.5 MCG/ACT inhaler Inhale 2 puffs into the lungs 2 (two) times daily.  1 Inhaler  5  . cetirizine (ZYRTEC) 10 MG tablet Take 10 mg by mouth at bedtime.       . Chlorpheniramine Maleate (CHLOR-TABLETS PO) Take 1 tablet by mouth at bedtime as needed.      . chlorpheniramine-HYDROcodone (TUSSIONEX PENNKINETIC ER) 10-8 MG/5ML LQCR Take 5 mLs by mouth every 12 (twelve) hours as needed.  140 mL  0  . Ciclesonide 37 MCG/ACT AERS Place 2 puffs into the nose daily as needed.       . Cinnamon 500 MG capsule Take 500 mg by mouth daily.      . Coenzyme Q10 (CO Q 10) 10 MG CAPS Take 1 capsule by mouth daily.        Marland Kitchen dextromethorphan-guaiFENesin (MUCINEX DM) 30-600 MG per 12 hr tablet Take 1 tablet by mouth  every 12 (twelve) hours as needed.       . diclofenac (VOLTAREN) 75 MG EC tablet Take 75 mg by mouth 2 (two) times daily.        . famotidine (PEPCID) 20 MG tablet Take 2 tablets (40 mg total) by mouth at bedtime. When coughing  30 tablet  4  . Homeopathic Products ([employer] LEG CRAMPS PAIN RELIEF PO) Per bottle as needed      . hydrochlorothiazide (HYDRODIURIL) 25 MG tablet Take 25 mg by mouth daily.      Marland Kitchen ipratropium (ATROVENT) 0.02 % nebulizer solution Take 2.5 mLs (500 mcg total) by nebulization 2 (two) times daily. And as needed  120 mL  6  . Magnesium 400 MG CAPS Take 3 capsules by mouth daily.       . Misc Natural Products (OSTEO BI-FLEX TRIPLE STRENGTH PO) Take 1 tablet by mouth daily.        Marland Kitchen NEXIUM 40 MG capsule Take 1 capsule by mouth Daily.      Marland Kitchen nystatin (MYCOSTATIN) 100000 UNIT/ML suspension Use 5 ml (cc) three times a day as needed.  240 mL  1  . traMADol (ULTRAM) 50 MG tablet Take 50 mg by mouth every 6 (six) hours as needed.        . traZODone (DESYREL) 50 MG tablet Take 25 mg by mouth at bedtime.       . vitamin B-12 (CYANOCOBALAMIN) 1000 MCG tablet Take 1,000 mcg by mouth daily.      . vitamin C (ASCORBIC ACID) 500 MG tablet Take 500 mg by mouth daily.      Marland Kitchen zolpidem (AMBIEN) 10 MG tablet Take 10 mg by mouth at bedtime.       . moxifloxacin (AVELOX) 400 MG tablet Take 1 tablet (400 mg total) by mouth daily.  5 tablet  0  . predniSONE (DELTASONE) 10 MG tablet Take 4 for three days 3 for three days 2 for three days 1 for three days and stop  30 tablet  0  . Respiratory Therapy Supplies (FLUTTER) DEVI Use 4 times daily  1 each  0  . [EXPIRED] methylPREDNISolone acetate (DEPO-MEDROL) injection 120 mg        No facility-administered encounter medications on file as of 10/12/2012.

## 2012-10-12 NOTE — Patient Instructions (Addendum)
Flutter valve 4 times daily Prednisone 10mg  Take 4 for three days 3 for three days 2 for three days 1 for three days and stop Avelox one daily for 5 days Stay on symbicort twice daily Return 6 weeks

## 2012-10-12 NOTE — Assessment & Plan Note (Signed)
Gold stage C. COPD with recurrent exacerbation Plan Flutter valve 4 times daily Prednisone 10mg  Take 4 for three days 3 for three days 2 for three days 1 for three days and stop Avelox one daily for 5 days Stay on symbicort twice daily Return 6 weeks

## 2012-11-25 ENCOUNTER — Other Ambulatory Visit: Payer: Self-pay | Admitting: *Deleted

## 2012-11-25 MED ORDER — NYSTATIN 100000 UNIT/ML MT SUSP: OROMUCOSAL | Status: DC

## 2012-12-02 ENCOUNTER — Encounter: Payer: Self-pay | Admitting: Critical Care Medicine

## 2012-12-02 ENCOUNTER — Ambulatory Visit (INDEPENDENT_AMBULATORY_CARE_PROVIDER_SITE_OTHER): Payer: Medicare Other | Admitting: Critical Care Medicine

## 2012-12-02 ENCOUNTER — Other Ambulatory Visit: Payer: Self-pay | Admitting: *Deleted

## 2012-12-02 VITALS — BP 130/74 | HR 69 | Temp 98.1°F | Ht 64.0 in | Wt 171.4 lb

## 2012-12-02 DIAGNOSIS — J441 Chronic obstructive pulmonary disease with (acute) exacerbation: Secondary | ICD-10-CM

## 2012-12-02 DIAGNOSIS — Z23 Encounter for immunization: Secondary | ICD-10-CM

## 2012-12-02 MED ORDER — CICLESONIDE 37 MCG/ACT NA AERS: 2.0000 | INHALATION_SPRAY | Freq: Every day | NASAL | Status: DC | PRN

## 2012-12-02 NOTE — Patient Instructions (Signed)
Flu vaccine given No medication changes  Return 3 months

## 2012-12-02 NOTE — Progress Notes (Signed)
Subjective:    Patient ID: Kristin Estrada, female    DOB: [birth year], 72 y.o.   MRN: [id number]  HPI  12/02/2012 Chief Complaint  Patient presents with  . Follow-up    Pt c/o dry cough occ, some wheezing  at night, some drainage.    Pt rx avelox last ov and did finally get better. Cough now is better.  Choking on phlegm but is better. Notes some pndrip Notes some wheeze at night Pt denies any significant sore throat, nasal congestion or excess secretions, fever, chills, sweats, unintended weight loss, pleurtic or exertional chest pain, orthopnea PND, or leg swelling Pt denies any increase in rescue therapy over baseline, denies waking up needing it or having any early am or nocturnal exacerbations of coughing/wheezing/or dyspnea. Pt also denies any obvious fluctuation in symptoms with  weather or environmental change or other alleviating or aggravating factors  Review of Systems  Constitutional:   No  weight loss, night sweats,  Fevers, chills, fatigue, lassitude. HEENT:   No headaches,  Difficulty swallowing,  Tooth/dental problems,  Sore throat,                No sneezing, itching, ear ache, nasal congestion, post nasal drip,   CV:  No chest pain,  Orthopnea, PND, swelling in lower extremities, anasarca, dizziness, palpitations  GI  No heartburn, indigestion, abdominal pain, nausea, vomiting, diarrhea, change in bowel habits, loss of appetite  Resp: Notes  shortness of breath with exertion not at rest.  Notes  excess mucus, notes productive cough,  No non-productive cough,  No coughing up of blood.  No change in color of mucus.  No wheezing.  No chest wall deformity  Skin: no rash or lesions.  GU: no dysuria, change in color of urine, no urgency or frequency.  No flank pain.  MS:  No joint pain or swelling.  No decreased range of motion.  No back pain.  Psych:  No change in mood or affect. No depression or anxiety.  No memory loss.     Objective:   Physical Exam  Filed  Vitals:   12/02/12 1412  BP: 130/74  Pulse: 69  Temp: 98.1 F (36.7 C)  TempSrc: Oral  Height: 5\' 4"  (1.626 m)  Weight: 171 lb 6.4 oz (77.747 kg)  SpO2: 94%    Gen: Pleasant, well-nourished, in no distress,  normal affect  ENT: No lesions,  mouth clear,  oropharynx clear, no postnasal drip  Neck: No JVD, no TMG, no carotid bruits  Lungs: No use of accessory muscles, no dullness to percussion, distant BS  Cardiovascular: RRR, heart sounds normal, no murmur or gallops, no peripheral edema  Abdomen: soft and NT, no HSM,  BS normal  Musculoskeletal: No deformities, no cyanosis or clubbing  Neuro: alert, non focal  Skin: Warm, no lesions or rashes  No results found.    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 Gold C copd , stable Plan No change in inhaled or maintenance medications. Return in  3 months    Updated Medication List Outpatient Encounter Prescriptions as of 12/02/2012  Medication Sig Dispense Refill  . albuterol (PROAIR HFA) 108 (90 BASE) MCG/ACT inhaler Inhale 2 puffs into the lungs every 4 (four) hours as needed.       Marland Kitchen albuterol (PROVENTIL) (2.5 MG/3ML) 0.083% nebulizer solution Take 2.5 mg by nebulization as needed. Take 2.5 mg by nebulization 2 (two) times daily. And as needed      .  amLODipine (NORVASC) 5 MG tablet Take 5 mg by mouth daily.        . budesonide-formoterol (SYMBICORT) 160-4.5 MCG/ACT inhaler Inhale 2 puffs into the lungs 2 (two) times daily.  1 Inhaler  5  . cetirizine (ZYRTEC) 10 MG tablet Take 10 mg by mouth at bedtime.       . chlorpheniramine-HYDROcodone (TUSSIONEX PENNKINETIC ER) 10-8 MG/5ML LQCR Take 5 mLs by mouth every 12 (twelve) hours as needed.  140 mL  0  . Cinnamon 500 MG capsule Take 500 mg by mouth daily.      . Coenzyme Q10 (CO Q 10) 10 MG CAPS Take 1 capsule by mouth daily.        Marland Kitchen dextromethorphan-guaiFENesin (MUCINEX DM) 30-600 MG per 12 hr tablet Take 1 tablet by mouth  every 12 (twelve) hours as needed.       . diclofenac (VOLTAREN) 75 MG EC tablet Take 75 mg by mouth 2 (two) times daily.        . famotidine (PEPCID) 20 MG tablet Take 2 tablets (40 mg total) by mouth at bedtime. When coughing  30 tablet  4  . Homeopathic Products ([employer] LEG CRAMPS PAIN RELIEF PO) Per bottle as needed      . hydrochlorothiazide (HYDRODIURIL) 25 MG tablet Take 25 mg by mouth daily.      Marland Kitchen ipratropium (ATROVENT) 0.02 % nebulizer solution Take 500 mcg by nebulization as needed. And as needed      . Magnesium 400 MG CAPS Take 4 capsules by mouth daily.       Marland Kitchen NEXIUM 40 MG capsule Take 1 capsule by mouth Daily.      Marland Kitchen nystatin (MYCOSTATIN) 100000 UNIT/ML suspension Use 5 ml (cc) three times a day as needed.  240 mL  0  . traMADol (ULTRAM) 50 MG tablet Take 50 mg by mouth every 6 (six) hours as needed.        . traZODone (DESYREL) 50 MG tablet Take 25 mg by mouth at bedtime.       . vitamin B-12 (CYANOCOBALAMIN) 1000 MCG tablet Take 1,000 mcg by mouth daily.      . vitamin C (ASCORBIC ACID) 500 MG tablet Take 500 mg by mouth daily.      Marland Kitchen zolpidem (AMBIEN) 10 MG tablet Take 10 mg by mouth at bedtime.       . [DISCONTINUED] albuterol (PROVENTIL) (2.5 MG/3ML) 0.083% nebulizer solution Take 2.5 mg by nebulization 2 (two) times daily. And as needed  360 mL  2  . [DISCONTINUED] Ciclesonide 37 MCG/ACT AERS Place 2 puffs into the nose daily as needed.       . [DISCONTINUED] ipratropium (ATROVENT) 0.02 % nebulizer solution Take 2.5 mLs (500 mcg total) by nebulization 2 (two) times daily. And as needed  120 mL  6  . Misc Natural Products (OSTEO BI-FLEX TRIPLE STRENGTH PO) Take 1 tablet by mouth daily.        Marland Kitchen Respiratory Therapy Supplies (FLUTTER) DEVI Use 4 times daily  1 each  0  . [DISCONTINUED] Chlorpheniramine Maleate (CHLOR-TABLETS PO) Take 1 tablet by mouth at bedtime as needed.      . [DISCONTINUED] moxifloxacin (AVELOX) 400 MG tablet Take 1 tablet (400 mg total) by mouth daily.  5  tablet  0  . [DISCONTINUED] predniSONE (DELTASONE) 10 MG tablet Take 4 for three days 3 for three days 2 for three days 1 for three days and stop  30 tablet  0   No  facility-administered encounter medications on file as of 12/02/2012.

## 2012-12-03 NOTE — Assessment & Plan Note (Signed)
Gold C copd , stable Plan No change in inhaled or maintenance medications. Return in  3 months

## 2012-12-13 ENCOUNTER — Telehealth: Payer: Self-pay | Admitting: *Deleted

## 2012-12-13 MED ORDER — AMOXICILLIN-POT CLAVULANATE 875-125 MG PO TABS: 1.0000 | ORAL_TABLET | Freq: Two times a day (BID) | ORAL | Status: DC

## 2012-12-13 MED ORDER — MOMETASONE FUROATE 50 MCG/ACT NA SUSP: 2.0000 | Freq: Every day | NASAL | Status: DC

## 2012-12-13 NOTE — Telephone Encounter (Signed)
yes

## 2012-12-13 NOTE — Telephone Encounter (Signed)
Called pt to schedule Prevnar injection. Pt states she is currently sick.  C/o nonprod cough, wheezing, burning/rawness feeling in chest, PND, and temp up to 100 this morning.  No increased SOB.  Was seen by PCP yesterday - nothing was given.  Pt hasn't tried any meds yet to help with symptoms.  Dr. Delford Field, pls advise.  Thank you.  Given pt's symptoms, how long should she wait to schedule Prevnar now?  Also, pt states she was given nasonex during last OV.  Would like rx called in.  I do not see this on med list.  Dr. Delford Field, are you ok with this?  Last OV with PW 12/02/12  Allergies  Allergen Reactions  . Prednisone     REACTION: agitation, insomina, mood change    [facility]

## 2012-12-13 NOTE — Telephone Encounter (Signed)
Hold prevnar for now Call in augmentin 875 bid x 7days

## 2012-12-13 NOTE — Telephone Encounter (Signed)
RX has been sent. Nothing further needed 

## 2012-12-13 NOTE — Telephone Encounter (Signed)
I spoke with pt and aware of recs. RX has been sent. Also, she wants to know if it is okay for [place] to call in the nasal spray? Please advise thanks

## 2012-12-26 ENCOUNTER — Telehealth: Payer: Self-pay | Admitting: Critical Care Medicine

## 2012-12-26 NOTE — Telephone Encounter (Signed)
Pt was contacted last week about getting the Prenvar vaccine. She did not come and get it then because she was sick. Will be in [place] tomorrow and would like to come by and get it. I will add her to the injection schedule to get Prenvar.

## 2012-12-26 NOTE — Telephone Encounter (Signed)
Error.Kristin Estrada ° °

## 2012-12-27 ENCOUNTER — Ambulatory Visit (INDEPENDENT_AMBULATORY_CARE_PROVIDER_SITE_OTHER): Payer: Medicare Other

## 2012-12-27 DIAGNOSIS — Z23 Encounter for immunization: Secondary | ICD-10-CM

## 2013-03-06 ENCOUNTER — Ambulatory Visit (INDEPENDENT_AMBULATORY_CARE_PROVIDER_SITE_OTHER): Payer: Medicare HMO | Admitting: Critical Care Medicine

## 2013-03-06 ENCOUNTER — Encounter: Payer: Self-pay | Admitting: Critical Care Medicine

## 2013-03-06 VITALS — BP 146/82 | HR 77 | Temp 98.4°F | Ht 64.0 in | Wt 175.2 lb

## 2013-03-06 DIAGNOSIS — J441 Chronic obstructive pulmonary disease with (acute) exacerbation: Secondary | ICD-10-CM

## 2013-03-06 NOTE — Progress Notes (Signed)
Subjective:    Patient ID: Kristin Estrada, female    DOB: 1940-09-01, 73 y.o.   MRN: [id number]  HPI 03/06/2013 Chief Complaint  Patient presents with  . 3 month follow up    Breathing doing well overall.  Does have DOE, occas wheezing, and occas nonprod cough.  No chest tightness/pain.  Doing well overall.  Doing well on symbicort. Pt denies any significant sore throat, nasal congestion or excess secretions, fever, chills, sweats, unintended weight loss, pleurtic or exertional chest pain, orthopnea PND, or leg swelling Pt denies any increase in rescue therapy over baseline, denies waking up needing it or having any early am or nocturnal exacerbations of coughing/wheezing/or dyspnea. Pt also denies any obvious fluctuation in symptoms with  weather or environmental change or other alleviating or aggravating factors       Review of Systems  Constitutional:   No  weight loss, night sweats,  Fevers, chills, fatigue, lassitude. HEENT:   No headaches,  Difficulty swallowing,  Tooth/dental problems,  Sore throat,                No sneezing, itching, ear ache, nasal congestion, post nasal drip,   CV:  No chest pain,  Orthopnea, PND, swelling in lower extremities, anasarca, dizziness, palpitations  GI  No heartburn, indigestion, abdominal pain, nausea, vomiting, diarrhea, change in bowel habits, loss of appetite  Resp: Notes  shortness of breath with exertion not at rest.  Notes  excess mucus, notes productive cough,  No non-productive cough,  No coughing up of blood.  No change in color of mucus.  No wheezing.  No chest wall deformity  Skin: no rash or lesions.  GU: no dysuria, change in color of urine, no urgency or frequency.  No flank pain.  MS:  No joint pain or swelling.  No decreased range of motion.  No back pain.  Psych:  No change in mood or affect. No depression or anxiety.  No memory loss.     Objective:   Physical Exam  Filed Vitals:   03/06/13 1402  BP: 146/82   Pulse: 77  Temp: 98.4 F (36.9 C)  TempSrc: Oral  Height: 5\' 4"  (1.626 m)  Weight: 175 lb 3.2 oz (79.47 kg)  SpO2: 94%    Gen: Pleasant, well-nourished, in no distress,  normal affect  ENT: No lesions,  mouth clear,  oropharynx clear, no postnasal drip  Neck: No JVD, no TMG, no carotid bruits  Lungs: No use of accessory muscles, no dullness to percussion, distant BS  Cardiovascular: RRR, heart sounds normal, no murmur or gallops, no peripheral edema  Abdomen: soft and NT, no HSM,  BS normal  Musculoskeletal: No deformities, no cyanosis or clubbing  Neuro: alert, non focal  Skin: Warm, no lesions or rashes  No results found.    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 Stable COPD at present Plan Cont inhaled meds as Rx     Updated Medication List Outpatient Encounter Prescriptions as of 03/06/2013  Medication Sig  . albuterol (PROAIR HFA) 108 (90 BASE) MCG/ACT inhaler Inhale 2 puffs into the lungs every 4 (four) hours as needed.   Marland Kitchen albuterol (PROVENTIL) (2.5 MG/3ML) 0.083% nebulizer solution Take 2.5 mg by nebulization as needed. Take 2.5 mg by nebulization 2 (two) times daily. And as needed  . amLODipine (NORVASC) 5 MG tablet Take 5 mg by mouth daily.    . budesonide-formoterol (SYMBICORT) 160-4.5 MCG/ACT inhaler Inhale 2 puffs into  the lungs 2 (two) times daily.  . cetirizine (ZYRTEC) 10 MG tablet Take 10 mg by mouth at bedtime.   . chlorpheniramine-HYDROcodone (TUSSIONEX PENNKINETIC ER) 10-8 MG/5ML LQCR Take 5 mLs by mouth every 12 (twelve) hours as needed.  . Cinnamon 500 MG capsule Take 500 mg by mouth daily.  . Coenzyme Q10 (CO Q 10) 10 MG CAPS Take 1 capsule by mouth daily.    Marland Kitchen dextromethorphan-guaiFENesin (MUCINEX DM) 30-600 MG per 12 hr tablet Take 1 tablet by mouth every 12 (twelve) hours as needed.   . diclofenac (VOLTAREN) 75 MG EC tablet Take 75 mg by mouth 2 (two) times daily.    . famotidine (PEPCID) 20 MG  tablet Take 2 tablets (40 mg total) by mouth at bedtime. When coughing  . Homeopathic Products ([employer] LEG CRAMPS PAIN RELIEF PO) Per bottle as needed  . hydrochlorothiazide (HYDRODIURIL) 25 MG tablet Take 25 mg by mouth daily.  Marland Kitchen ipratropium (ATROVENT) 0.02 % nebulizer solution Take 500 mcg by nebulization as needed. And as needed  . Magnesium 400 MG CAPS Take 4 capsules by mouth daily.   . mometasone (NASONEX) 50 MCG/ACT nasal spray Place 2 sprays into the nose daily.  Marland Kitchen NEXIUM 40 MG capsule Take 1 capsule by mouth Daily.  Marland Kitchen nystatin (MYCOSTATIN) 100000 UNIT/ML suspension Use 5 ml (cc) three times a day as needed.  Marland Kitchen Respiratory Therapy Supplies (FLUTTER) DEVI Use 4 times daily  . traMADol (ULTRAM) 50 MG tablet Take 50 mg by mouth every 6 (six) hours as needed.    . traZODone (DESYREL) 50 MG tablet Take 25 mg by mouth at bedtime.   . vitamin B-12 (CYANOCOBALAMIN) 1000 MCG tablet Take 1,000 mcg by mouth daily.  . vitamin C (ASCORBIC ACID) 500 MG tablet Take 500 mg by mouth daily.  . Vitamin D, Ergocalciferol, (DRISDOL) 50000 UNITS CAPS capsule Take 50,000 Units by mouth every 30 (thirty) days.  Marland Kitchen zolpidem (AMBIEN) 10 MG tablet Take 10 mg by mouth at bedtime.   . [DISCONTINUED] Ciclesonide 37 MCG/ACT AERS Place 2 puffs into the nose daily as needed.  . [DISCONTINUED] amoxicillin-clavulanate (AUGMENTIN) 875-125 MG per tablet Take 1 tablet by mouth 2 (two) times daily.  . [DISCONTINUED] Misc Natural Products (OSTEO BI-FLEX TRIPLE STRENGTH PO) Take 1 tablet by mouth daily.

## 2013-03-06 NOTE — Patient Instructions (Signed)
No change in medications. Return in   3 months 

## 2013-03-06 NOTE — Assessment & Plan Note (Signed)
Stable COPD at present Plan Cont inhaled meds as Rx

## 2013-03-09 ENCOUNTER — Other Ambulatory Visit: Payer: Self-pay | Admitting: *Deleted

## 2013-03-09 MED ORDER — NYSTATIN 100000 UNIT/ML MT SUSP: OROMUCOSAL | Status: DC

## 2013-03-16 ENCOUNTER — Ambulatory Visit (INDEPENDENT_AMBULATORY_CARE_PROVIDER_SITE_OTHER): Payer: Medicare HMO | Admitting: Critical Care Medicine

## 2013-03-16 ENCOUNTER — Encounter: Payer: Self-pay | Admitting: Critical Care Medicine

## 2013-03-16 VITALS — BP 134/72 | HR 68 | Temp 98.1°F | Ht 64.0 in | Wt 174.8 lb

## 2013-03-16 DIAGNOSIS — J441 Chronic obstructive pulmonary disease with (acute) exacerbation: Secondary | ICD-10-CM

## 2013-03-16 DIAGNOSIS — J440 Chronic obstructive pulmonary disease with acute lower respiratory infection: Secondary | ICD-10-CM

## 2013-03-16 MED ORDER — AZITHROMYCIN 250 MG PO TABS: 250.0000 mg | ORAL_TABLET | Freq: Every day | ORAL | Status: DC

## 2013-03-16 MED ORDER — METHYLPREDNISOLONE ACETATE 80 MG/ML IJ SUSP
120.0000 mg | Freq: Once | INTRAMUSCULAR | Status: AC
  Administered 2013-03-16: 120 mg via INTRAMUSCULAR

## 2013-03-16 NOTE — Patient Instructions (Signed)
A depomedrol injection was given Azithromycin 250mg  Take two once then one daily until gone (sent to pharmacy) No change in inhaled medications Return 2 months

## 2013-03-16 NOTE — Progress Notes (Signed)
Subjective:    Patient ID: Kristin Estrada, female    DOB: Apr 04, 1940, 73 y.o.   MRN: [id number]  HPI  03/16/2013 Chief Complaint  Patient presents with  . Acute Visit    c/o started coughing-white x 5days,sorethroat,pnd,ha,denies fcs,occass. wheezing, increased sob x 5 days,"raw" feeling midchest  Coughing mucus white, no chills, no fever, more pndrip, sorethroat, burning into chest .  More dyspnea, sl wheeze Pt denies any significant sore throat, nasal congestion or excess secretions, fever, chills, sweats, unintended weight loss, pleurtic or exertional chest pain, orthopnea PND, or leg swelling Pt denies any increase in rescue therapy over baseline, denies waking up needing it or having any early am or nocturnal exacerbations of coughing/wheezing/or dyspnea. Pt also denies any obvious fluctuation in symptoms with  weather or environmental change or other alleviating or aggravating factors    Review of Systems  Constitutional:   No  weight loss, night sweats,  Fevers, chills, fatigue, lassitude. HEENT:   No headaches,  Difficulty swallowing,  Tooth/dental problems,  Sore throat,                No sneezing, itching, ear ache, nasal congestion, post nasal drip,   CV:  No chest pain,  Orthopnea, PND, swelling in lower extremities, anasarca, dizziness, palpitations  GI  No heartburn, indigestion, abdominal pain, nausea, vomiting, diarrhea, change in bowel habits, loss of appetite  Resp: Notes  shortness of breath with exertion not at rest.  Notes  excess mucus, notes productive cough,  No non-productive cough,  No coughing up of blood.  No change in color of mucus.  No wheezing.  No chest wall deformity  Skin: no rash or lesions.  GU: no dysuria, change in color of urine, no urgency or frequency.  No flank pain.  MS:  No joint pain or swelling.  No decreased range of motion.  No back pain.  Psych:  No change in mood or affect. No depression or anxiety.  No memory loss.      Objective:   Physical Exam  Filed Vitals:   03/16/13 1019  BP: 134/72  Pulse: 68  Temp: 98.1 F (36.7 C)  TempSrc: Oral  Height: 5\' 4"  (1.626 m)  Weight: 174 lb 12.8 oz (79.289 kg)  SpO2: 95%    Gen: Pleasant, well-nourished, in no distress,  normal affect  ENT: No lesions,  mouth clear,  oropharynx clear, no postnasal drip  Neck: No JVD, no TMG, no carotid bruits  Lungs: No use of accessory muscles, no dullness to percussion, distant BS, mild expiratory wheeze  Cardiovascular: RRR, heart sounds normal, no murmur or gallops, no peripheral edema  Abdomen: soft and NT, no HSM,  BS normal  Musculoskeletal: No deformities, no cyanosis or clubbing  Neuro: alert, non focal  Skin: Warm, no lesions or rashes  No results found.    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 Mild exacerbation Plan A depomedrol injection was given Azithromycin 250mg  Take two once then one daily until gone (sent to pharmacy) No change in inhaled medications Return 2 months     Updated Medication List Outpatient Encounter Prescriptions as of 03/16/2013  Medication Sig  . albuterol (PROAIR HFA) 108 (90 BASE) MCG/ACT inhaler Inhale 2 puffs into the lungs every 4 (four) hours as needed.   Marland Kitchen albuterol (PROVENTIL) (2.5 MG/3ML) 0.083% nebulizer solution Take 2.5 mg by nebulization as needed. Take 2.5 mg by nebulization  2 (two) times daily. And as needed  . amLODipine (NORVASC) 5 MG tablet Take 5 mg by mouth daily.    . budesonide-formoterol (SYMBICORT) 160-4.5 MCG/ACT inhaler Inhale 2 puffs into the lungs 2 (two) times daily.  . cetirizine (ZYRTEC) 10 MG tablet Take 10 mg by mouth at bedtime.   . chlorpheniramine-HYDROcodone (TUSSIONEX PENNKINETIC ER) 10-8 MG/5ML LQCR Take 5 mLs by mouth every 12 (twelve) hours as needed.  . Cinnamon 500 MG capsule Take 500 mg by mouth daily.  . Coenzyme Q10 (CO Q 10)  10 MG CAPS Take 1 capsule by mouth daily.    Marland Kitchen dextromethorphan-guaiFENesin (MUCINEX DM) 30-600 MG per 12 hr tablet Take 1 tablet by mouth every 12 (twelve) hours as needed.   . diclofenac (VOLTAREN) 75 MG EC tablet Take 75 mg by mouth 2 (two) times daily.    . famotidine (PEPCID) 20 MG tablet Take 2 tablets (40 mg total) by mouth at bedtime. When coughing  . Homeopathic Products ([employer] LEG CRAMPS PAIN RELIEF PO) Per bottle as needed  . ipratropium (ATROVENT) 0.02 % nebulizer solution Take 500 mcg by nebulization as needed. And as needed  . Magnesium 400 MG CAPS Take 4 capsules by mouth daily.   . mometasone (NASONEX) 50 MCG/ACT nasal spray Place 2 sprays into the nose daily.  Marland Kitchen NEXIUM 40 MG capsule Take 1 capsule by mouth Daily.  Marland Kitchen nystatin (MYCOSTATIN) 100000 UNIT/ML suspension Use 5 ml (cc) three times a day as needed.  Marland Kitchen Respiratory Therapy Supplies (FLUTTER) DEVI Use 4 times daily  . traMADol (ULTRAM) 50 MG tablet Take 50 mg by mouth every 6 (six) hours as needed.    . traZODone (DESYREL) 50 MG tablet Take 25 mg by mouth at bedtime.   . vitamin B-12 (CYANOCOBALAMIN) 1000 MCG tablet Take 1,000 mcg by mouth daily.  . vitamin C (ASCORBIC ACID) 500 MG tablet Take 500 mg by mouth daily.  . Vitamin D, Ergocalciferol, (DRISDOL) 50000 UNITS CAPS capsule Take 50,000 Units by mouth every 30 (thirty) days.  Marland Kitchen zolpidem (AMBIEN) 10 MG tablet Take 10 mg by mouth at bedtime.   Marland Kitchen azithromycin (ZITHROMAX) 250 MG tablet Take 1 tablet (250 mg total) by mouth daily. Take two once then one daily until gone  . hydrochlorothiazide (HYDRODIURIL) 25 MG tablet Take 25 mg by mouth daily.  . [EXPIRED] methylPREDNISolone acetate (DEPO-MEDROL) injection 120 mg

## 2013-03-17 NOTE — Assessment & Plan Note (Signed)
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 Mild exacerbation Plan A depomedrol injection was given Azithromycin 250mg  Take two once then one daily until gone (sent to pharmacy) No change in inhaled medications Return 2 months

## 2013-05-15 ENCOUNTER — Ambulatory Visit (INDEPENDENT_AMBULATORY_CARE_PROVIDER_SITE_OTHER): Payer: Medicare HMO | Admitting: Critical Care Medicine

## 2013-05-15 ENCOUNTER — Encounter: Payer: Self-pay | Admitting: Critical Care Medicine

## 2013-05-15 VITALS — BP 144/66 | HR 73 | Ht 64.0 in | Wt 169.0 lb

## 2013-05-15 DIAGNOSIS — J441 Chronic obstructive pulmonary disease with (acute) exacerbation: Secondary | ICD-10-CM

## 2013-05-15 MED ORDER — NYSTATIN 100000 UNIT/ML MT SUSP: OROMUCOSAL | Status: DC

## 2013-05-15 NOTE — Patient Instructions (Signed)
No change in medications Return 4 months 

## 2013-05-15 NOTE — Progress Notes (Signed)
Subjective:    Patient ID: Kristin Estrada, female    DOB: 1940/02/24, 73 y.o.   MRN: [id number]  HPI  05/15/2013 Chief Complaint  Patient presents with  . Follow-up    Pt c/o some sinus congestion, no other breathing complaints today.    Notes some pndrip.  No other breathing issues. Pt is on nasonex. No cough. No wheezing.   Pt denies any significant sore throat, nasal congestion or excess secretions, fever, chills, sweats, unintended weight loss, pleurtic or exertional chest pain, orthopnea PND, or leg swelling Pt denies any increase in rescue therapy over baseline, denies waking up needing it or having any early am or nocturnal exacerbations of coughing/wheezing/or dyspnea. Pt also denies any obvious fluctuation in symptoms with  weather or environmental change or other alleviating or aggravating factors  Review of Systems  Constitutional:   No  weight loss, night sweats,  Fevers, chills, fatigue, lassitude. HEENT:   No headaches,  Difficulty swallowing,  Tooth/dental problems,  Sore throat,                No sneezing, itching, ear ache, nasal congestion, post nasal drip,   CV:  No chest pain,  Orthopnea, PND, swelling in lower extremities, anasarca, dizziness, palpitations  GI  No heartburn, indigestion, abdominal pain, nausea, vomiting, diarrhea, change in bowel habits, loss of appetite  Resp: Notes  shortness of breath with exertion not at rest.  No  excess mucus, no productive cough,  No non-productive cough,  No coughing up of blood.  No change in color of mucus.  No wheezing.  No chest wall deformity  Skin: no rash or lesions.  GU: no dysuria, change in color of urine, no urgency or frequency.  No flank pain.  MS:  No joint pain or swelling.  No decreased range of motion.  No back pain.  Psych:  No change in mood or affect. No depression or anxiety.  No memory loss.     Objective:   Physical Exam  Filed Vitals:   05/15/13 1426  BP: 144/66  Pulse: 73  Height: 5'  4" (1.626 m)  Weight: 169 lb (76.658 kg)  SpO2: 95%    Gen: Pleasant, well-nourished, in no distress,  normal affect  ENT: No lesions,  mouth clear,  oropharynx clear, no postnasal drip  Neck: No JVD, no TMG, no carotid bruits  Lungs: No use of accessory muscles, no dullness to percussion, distant BS, mild expiratory wheeze  Cardiovascular: RRR, heart sounds normal, no murmur or gallops, no peripheral edema  Abdomen: soft and NT, no HSM,  BS normal  Musculoskeletal: No deformities, no cyanosis or clubbing  Neuro: alert, non focal  Skin: Warm, no lesions or rashes  No results found.    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 Gold stage C. COPD with chronic bronchitis stable at this time Plan Maintain inhaled medications as prescribed     Updated Medication List Outpatient Encounter Prescriptions as of 05/15/2013  Medication Sig  . albuterol (PROAIR HFA) 108 (90 BASE) MCG/ACT inhaler Inhale 2 puffs into the lungs every 4 (four) hours as needed.   Marland Kitchen albuterol (PROVENTIL) (2.5 MG/3ML) 0.083% nebulizer solution Take 2.5 mg by nebulization as needed. Take 2.5 mg by nebulization 2 (two) times daily. And as needed  . amLODipine (NORVASC) 5 MG tablet Take 5 mg by mouth daily.    . budesonide-formoterol (SYMBICORT) 160-4.5 MCG/ACT inhaler Inhale 2 puffs into the lungs 2 (two)  times daily.  . cetirizine (ZYRTEC) 10 MG tablet Take 10 mg by mouth at bedtime.   . chlorpheniramine-HYDROcodone (TUSSIONEX PENNKINETIC ER) 10-8 MG/5ML LQCR Take 5 mLs by mouth every 12 (twelve) hours as needed.  . Cinnamon 500 MG capsule Take 500 mg by mouth daily.  . Coenzyme Q10 (CO Q 10) 10 MG CAPS Take 1 capsule by mouth daily.    Marland Kitchen dextromethorphan-guaiFENesin (MUCINEX DM) 30-600 MG per 12 hr tablet Take 1 tablet by mouth every 12 (twelve) hours as needed.   . diclofenac (VOLTAREN) 75 MG EC tablet Take 75 mg by mouth 2 (two) times daily.    . famotidine  (PEPCID) 20 MG tablet Take 2 tablets (40 mg total) by mouth at bedtime. When coughing  . Homeopathic Products ([employer] LEG CRAMPS PAIN RELIEF PO) Per bottle as needed  . hydrochlorothiazide (HYDRODIURIL) 25 MG tablet Take 25 mg by mouth daily.  Marland Kitchen ipratropium (ATROVENT) 0.02 % nebulizer solution Take 500 mcg by nebulization as needed. And as needed  . Magnesium 400 MG CAPS Take 4 capsules by mouth daily.   . mometasone (NASONEX) 50 MCG/ACT nasal spray Place 2 sprays into the nose daily.  Marland Kitchen NEXIUM 40 MG capsule Take 1 capsule by mouth Daily.  Marland Kitchen nystatin (MYCOSTATIN) 100000 UNIT/ML suspension Use 5 ml (cc) three times a day as needed.  Marland Kitchen Respiratory Therapy Supplies (FLUTTER) DEVI Use 4 times daily  . traMADol (ULTRAM) 50 MG tablet Take 50 mg by mouth every 6 (six) hours as needed.    . traZODone (DESYREL) 50 MG tablet Take 25 mg by mouth at bedtime.   . vitamin B-12 (CYANOCOBALAMIN) 1000 MCG tablet Take 1,000 mcg by mouth daily.  . vitamin C (ASCORBIC ACID) 500 MG tablet Take 500 mg by mouth daily.  . Vitamin D, Ergocalciferol, (DRISDOL) 50000 UNITS CAPS capsule Take 50,000 Units by mouth every 30 (thirty) days.  Marland Kitchen zolpidem (AMBIEN) 10 MG tablet Take 10 mg by mouth at bedtime.   . [DISCONTINUED] nystatin (MYCOSTATIN) 100000 UNIT/ML suspension Use 5 ml (cc) three times a day as needed.  . [DISCONTINUED] azithromycin (ZITHROMAX) 250 MG tablet Take 1 tablet (250 mg total) by mouth daily. Take two once then one daily until gone

## 2013-05-15 NOTE — Assessment & Plan Note (Signed)
Gold stage C. COPD with chronic bronchitis stable at this time Plan Maintain inhaled medications as prescribed

## 2013-07-20 ENCOUNTER — Other Ambulatory Visit: Payer: Self-pay | Admitting: Critical Care Medicine

## 2013-07-20 MED ORDER — NYSTATIN 100000 UNIT/ML MT SUSP: OROMUCOSAL | Status: DC

## 2013-07-20 NOTE — Telephone Encounter (Signed)
Nystatin refilled to [facility] pharmacy per pt request.  Filled last 02/2013 with 1 additional refill Pt filled mouth wash 03/09/13 and 05/19/13 We have record of a refill being sent to [facility] 04/2013 - 283mL x 1 RF Pt states that she uses [facility] for her refills and would like it sent there. Pt states that she has not gotten a prescription for this since we sent one 02/2013.  Spoke with pharmacist--gave verbal order to refill per patient request. Pt has recall in system to be set up for appt with PW 08/2013  Nothing further needed.

## 2013-08-03 ENCOUNTER — Telehealth: Payer: Self-pay | Admitting: Critical Care Medicine

## 2013-08-03 NOTE — Telephone Encounter (Signed)
Spoke with pt  She is c/o cough and hoarseness x 3 days  Feels fatigued  No fever  OV with MW for 10:45 am 08/04/13 Pt advised to bring all meds in hand to appt  Will call sooner or seek emergent care if needed

## 2013-08-04 ENCOUNTER — Ambulatory Visit (INDEPENDENT_AMBULATORY_CARE_PROVIDER_SITE_OTHER): Payer: Medicare HMO | Admitting: Internal Medicine

## 2013-08-04 ENCOUNTER — Ambulatory Visit (INDEPENDENT_AMBULATORY_CARE_PROVIDER_SITE_OTHER)
Admission: RE | Admit: 2013-08-04 | Discharge: 2013-08-04 | Disposition: A | Discharge status: Home / Self Care | Payer: Medicare HMO | Source: Ambulatory Visit | Attending: Internal Medicine | Admitting: Internal Medicine

## 2013-08-04 ENCOUNTER — Encounter: Payer: Self-pay | Admitting: Internal Medicine

## 2013-08-04 VITALS — BP 124/70 | HR 85 | Temp 98.5°F | Ht 64.0 in | Wt 163.0 lb

## 2013-08-04 DIAGNOSIS — J441 Chronic obstructive pulmonary disease with (acute) exacerbation: Secondary | ICD-10-CM

## 2013-08-04 DIAGNOSIS — K219 Gastro-esophageal reflux disease without esophagitis: Secondary | ICD-10-CM

## 2013-08-04 MED ORDER — METHYLPREDNISOLONE ACETATE 80 MG/ML IJ SUSP
120.0000 mg | Freq: Once | INTRAMUSCULAR | Status: AC
  Administered 2013-08-04: 120 mg via INTRAMUSCULAR

## 2013-08-04 MED ORDER — DOXYCYCLINE HYCLATE 100 MG PO TABS: 100.0000 mg | ORAL_TABLET | Freq: Two times a day (BID) | ORAL | Status: DC

## 2013-08-04 MED ORDER — PANTOPRAZOLE SODIUM 40 MG PO TBEC: 40.0000 mg | DELAYED_RELEASE_TABLET | Freq: Every day | ORAL | Status: DC

## 2013-08-04 MED ORDER — BUDESONIDE-FORMOTEROL FUMARATE 80-4.5 MCG/ACT IN AERO: INHALATION_SPRAY | RESPIRATORY_TRACT | Status: DC

## 2013-08-04 NOTE — Progress Notes (Signed)
  Subjective:    Patient ID: Kristin Estrada, female    DOB: 01-19-1941, 73 y.o.   MRN: [id number]    Brief patient profile:  05/15/2013 Chief Complaint  Patient presents with  . Follow-up    Pt c/o some sinus congestion, no other breathing complaints today.    Notes some pndrip.  No other breathing issues. Pt is on nasonex. rec no change rx   08/04/2013 f/u ov/Rosalyn Archambault re:  Chief Complaint  Patient presents with  . ACUTE OFFICE VISIT    Pt c/o increased dry cough, hoarseness and fever x 1 day.  Mucus production at times--yellow in color.  overall doing better p last ov (which she attributes to getting Prevnar)  then acutely worse 08/02/13 hoarse/ no sore throat then fever pm 6/18 and cough with yellow mucus  Not on nexium or H2, cramps from nexium  Not limited by breathing from desired activities    No obvious day to day or daytime variabilty or assoc  cp or chest tightness, subjective wheeze overt sinus or hb symptoms. No unusual exp hx or h/o childhood pna/ asthma or knowledge of premature birth.  Sleeping ok without nocturnal  or early am exacerbation  of respiratory  c/o's or need for noct saba. Also denies any obvious fluctuation of symptoms with weather or environmental changes or other aggravating or alleviating factors except as outlined above   Current Medications, Allergies, Complete Past Medical History, Past Surgical History, Family History, and Social History were reviewed in [employer] record.  ROS  The following are not active complaints unless bolded sore throat, dysphagia, dental problems, itching, sneezing,  nasal congestion or excess/ purulent secretions, ear ache,   fever, chills, sweats, unintended wt loss, pleuritic or exertional cp, hemoptysis,  orthopnea pnd or leg swelling, presyncope, palpitations, heartburn, abdominal pain, anorexia, nausea, vomiting, diarrhea  or change in bowel or urinary habits, change in stools or urine,  dysuria,hematuria,  rash, arthralgias, visual complaints, headache, numbness weakness or ataxia or problems with walking or coordination,  change in mood/affect or memory.                      Objective:   Physical Exam     Wt Readings from Last 3 Encounters:  08/04/13 163 lb (73.936 kg)  05/15/13 169 lb (76.658 kg)  03/16/13 174 lb 12.8 oz (79.289 kg)       Gen: Pleasant, well-nourished, in no distress,  normal affect  ENT: No lesions,  mouth clear,  oropharynx clear, no postnasal drip  Neck: No JVD, no TMG, no carotid bruits  Lungs: No use of accessory muscles, no dullness to percussion, distant BS, mild expiratory wheeze  Cardiovascular: RRR, heart sounds normal, no murmur or gallops, no peripheral edema  Abdomen: soft and NT, no HSM,  BS normal  Musculoskeletal: No deformities, no cyanosis or clubbing  Neuro: alert, non focal  Skin: Warm, no lesions or rashes   CXR  08/04/2013 :  No acute cardiopulmonary disease   .    Assessment & Plan:

## 2013-08-04 NOTE — Patient Instructions (Addendum)
Doxycycline 100 mg twice daily x 10 days  Depomedrol 120 today  For cough > tramdol 50 mg every 4 hours if needed   Try symbicort 80 Take 2 puffs first thing in am and then another 2 puffs about 12 hours later to see if do as well with less mouth/throat irritation and if so switch over to it  Work on inhaler technique:  relax and gently blow all the way out then take a nice smooth deep breath back in, triggering the inhaler at same time you start breathing in.  Hold for up to 5 seconds if you can.  Rinse and gargle with water when done  Pantoprazole (protonix) 40 mg   Take 30-60 min before first meal of the day and Pepcid 20 mg one bedtime for any flare of any respiratory or throat/nasal symptoms because GERD (REFLUX)  is an extremely common cause of respiratory symptoms just like yours, many times with no significant heartburn at all.    It can be treated with medication, but also with lifestyle changes including avoidance of late meals, excessive alcohol, smoking cessation, and avoid fatty foods, chocolate, peppermint, colas, red wine, and acidic juices such as orange juice.  NO MINT OR MENTHOL PRODUCTS SO NO COUGH DROPS  USE SUGARLESS CANDY INSTEAD (jolley ranchers or Stover's)  NO OIL BASED VITAMINS - use powdered substitutes.  Please remember to go to the x-ray department downstairs for your tests - we will call you with the results when they are available.

## 2013-08-07 NOTE — Assessment & Plan Note (Signed)
Explained natural history of uri and why it's necessary in patients at risk to treat GERD aggressively  at least  short term   to reduce risk of evolving cyclical cough initially  triggered by epithelial injury and a heightened sensitivty to the effects of any upper airway irritants,  most importantly acid - related.  That is, the more sensitive the epithelium damaged for virus, the more the cough, the more the secondary reflux (especially in those prone to reflux) the more the irritation of the sensitive mucosa and so on in a cyclical pattern.  - since can't tol nexium will offer combination of generic protonix and hs pepcid  See instructions for specific recommendations which were reviewed directly with the patient who was given a copy with highlighter outlining the key components.

## 2013-08-07 NOTE — Progress Notes (Signed)
Quick Note:  Spoke with pt and notified of results per Dr. Wert. Pt verbalized understanding and denied any questions.  ______ 

## 2013-08-07 NOTE — Assessment & Plan Note (Addendum)
PFTs 11/2005 FEV1  1.40 (67%) ratio 61  DDX of  difficult airways management all start with A and  include Adherence, Ace Inhibitors, Acid Reflux, Active Sinus Disease, Alpha 1 Antitripsin deficiency, Anxiety masquerading as Airways dz,  ABPA,  allergy(esp in young), Aspiration (esp in elderly), Adverse effects of DPI,  Active smokers, plus two Bs  = Bronchiectasis and Beta blocker use..and one C= CHF  Adherence is always the initial "prime suspect" and is a multilayered concern that requires a "trust but verify" approach in every patient - starting with knowing how to use medications, especially inhalers, correctly, keeping up with refills and understanding the fundamental difference between maintenance and prns vs those medications only taken for a very short course and then stopped and not refilled.  - note poor correlation between her med list and what she actually takes but declined to do med rec again (last last med calendar) - the proper method of use, as well as anticipated side effects, of a metered-dose inhaler are discussed and demonstrated to the patient. Improved effectiveness after extensive coaching during this visit to a level of approximately  75% > try symbicort 80 instead of teh 160 as the higher dose may be bothering her throat  ? Acid (or non-acid) GERD > always difficult to exclude as up to 75% of pts in some series report no assoc GI/ Heartburn symptoms> rec max (24h)  acid suppression and diet restrictions/ reviewed and instructions given in writing.   ? Active sinus dx ? Sinus ct next step (last neg in 2011)

## 2013-08-16 ENCOUNTER — Telehealth: Payer: Self-pay | Admitting: Critical Care Medicine

## 2013-08-16 MED ORDER — AMOXICILLIN-POT CLAVULANATE 875-125 MG PO TABS: 1.0000 | ORAL_TABLET | Freq: Two times a day (BID) | ORAL | Status: DC

## 2013-08-16 NOTE — Telephone Encounter (Signed)
ATC - Unable to leave message - Will try back - Rx for Augmentin was sent to pharmacy.

## 2013-08-16 NOTE — Telephone Encounter (Signed)
Spoke with pt Saw Dr Melvyn Novas 08/04/13 for Acute. Requesting Augmentin Rx be sent to pharmacy Completed course abx (Doxy) x 1 week ago Still having persistent head congestion, PND, runny nose, and some cough.  Allergies  Allergen Reactions  . Prednisone     REACTION: agitation, insomina, mood change   Please advise Dr Joya Gaskins. Thanks.

## 2013-08-16 NOTE — Telephone Encounter (Signed)
Ok to call in augmentin 875mg  twice daily x 7days

## 2013-08-17 NOTE — Telephone Encounter (Signed)
Pt advised. Kristin Estrada, CMA  

## 2013-10-25 ENCOUNTER — Ambulatory Visit (INDEPENDENT_AMBULATORY_CARE_PROVIDER_SITE_OTHER): Payer: Medicare HMO | Admitting: Critical Care Medicine

## 2013-10-25 ENCOUNTER — Encounter: Payer: Self-pay | Admitting: Critical Care Medicine

## 2013-10-25 VITALS — BP 174/80 | HR 72 | Temp 97.7°F | Ht 64.0 in | Wt 161.0 lb

## 2013-10-25 DIAGNOSIS — J441 Chronic obstructive pulmonary disease with (acute) exacerbation: Secondary | ICD-10-CM

## 2013-10-25 DIAGNOSIS — Z23 Encounter for immunization: Secondary | ICD-10-CM

## 2013-10-25 NOTE — Progress Notes (Signed)
Subjective:    Patient ID: Kristin Estrada, female    DOB: 1940/05/01, 73 y.o.   MRN: [id number]  HPI 10/25/2013 Chief Complaint  Patient presents with  . Follow-up    Breathing doing well overall.  Feels SOB is at baseline.  Has an occas cough with clear mucus in the mornings.  No wheezing or chest tightness/pain.  Dyspnea at baseline.  occ clear mucus with cough.  No wheezing. Back on 160 symbicort.  Pt had augmentin early 08/2013.  CXR 07/2013 Clear Sees EYE MD [facility] and getting Avastin.   Off all antacids.  PPI causes GI cramps Now on vitamin D     Review of Systems Constitutional:   No  weight loss, night sweats,  Fevers, chills, fatigue, lassitude. HEENT:   No headaches,  Difficulty swallowing,  Tooth/dental problems,  Sore throat,                No sneezing, itching, ear ache, nasal congestion, post nasal drip,   CV:  No chest pain,  Orthopnea, PND, swelling in lower extremities, anasarca, dizziness, palpitations  GI  No heartburn, indigestion, abdominal pain, nausea, vomiting, diarrhea, change in bowel habits, loss of appetite  Resp: No shortness of breath with exertion or at rest.  No excess mucus, no productive cough,  No non-productive cough,  No coughing up of blood.  No change in color of mucus.  No wheezing.  No chest wall deformity  Skin: no rash or lesions.  GU: no dysuria, change in color of urine, no urgency or frequency.  No flank pain.  MS:  No joint pain or swelling.  No decreased range of motion.  No back pain.  Psych:  No change in mood or affect. No depression or anxiety.  No memory loss.     Objective:   Physical Exam Filed Vitals:   10/25/13 1150  BP: 174/80  Pulse: 72  Temp: 97.7 F (36.5 C)  TempSrc: Oral  Height: 5\' 4"  (1.626 m)  Weight: 161 lb (73.029 kg)  SpO2: 94%    Gen: Pleasant, well-nourished, in no distress,  normal affect  ENT: No lesions,  mouth clear,  oropharynx clear, no postnasal drip  Neck: No JVD, no TMG, no  carotid bruits  Lungs: No use of accessory muscles, no dullness to percussion, distant breath sound  Cardiovascular: RRR, heart sounds normal, no murmur or gallops, no peripheral edema  Abdomen: soft and NT, no HSM,  BS normal  Musculoskeletal: No deformities, no cyanosis or clubbing  Neuro: alert, non focal  Skin: Warm, no lesions or rashes  No results found.        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 Plan Flu vaccine was administered No change in inhaled medications   Updated Medication List Outpatient Encounter Prescriptions as of 10/25/2013  Medication Sig  . albuterol (PROAIR HFA) 108 (90 BASE) MCG/ACT inhaler Inhale 2 puffs into the lungs every 4 (four) hours as needed.   Marland Kitchen albuterol (PROVENTIL) (2.5 MG/3ML) 0.083% nebulizer solution Take 2.5 mg by nebulization as needed. Take 2.5 mg by nebulization 2 (two) times daily. And as needed  . amLODipine (NORVASC) 5 MG tablet Take 5 mg by mouth daily.    . Bevacizumab (AVASTIN IV) Inject into the vein. 1.25 mg injection into the right eye every 6 wks  . budesonide-formoterol (SYMBICORT) 160-4.5 MCG/ACT inhaler Inhale 2 puffs into the lungs 2 (two) times daily.  Marland Kitchen  cetirizine (ZYRTEC) 10 MG tablet Take 10 mg by mouth at bedtime.   . chlorpheniramine-HYDROcodone (TUSSIONEX PENNKINETIC ER) 10-8 MG/5ML LQCR Take 5 mLs by mouth every 12 (twelve) hours as needed.  . cholecalciferol (VITAMIN D) 1000 UNITS tablet Take 1,000 Units by mouth daily.  . Cinnamon 500 MG capsule Take 500 mg by mouth daily.  . Coenzyme Q10 (CO Q 10) 10 MG CAPS Take 1 capsule by mouth daily.    Marland Kitchen dextromethorphan-guaiFENesin (MUCINEX DM) 30-600 MG per 12 hr tablet Take 1 tablet by mouth every 12 (twelve) hours as needed.   . diclofenac (VOLTAREN) 75 MG EC tablet Take 75 mg by mouth 2 (two) times daily.    . famotidine (PEPCID) 20 MG tablet Take 40 mg by mouth at bedtime as needed. When coughing   . Homeopathic Products ([employer] LEG CRAMPS PAIN RELIEF PO) Per bottle as needed  . ipratropium (ATROVENT) 0.02 % nebulizer solution Take 500 mcg by nebulization as needed. And as needed  . Magnesium 400 MG CAPS Take 4 capsules by mouth daily.   . mometasone (NASONEX) 50 MCG/ACT nasal spray Place 2 sprays into the nose daily.  Marland Kitchen nystatin (MYCOSTATIN) 100000 UNIT/ML suspension Use 5 ml (cc) three times a day as needed.  Marland Kitchen Respiratory Therapy Supplies (FLUTTER) DEVI as needed.  . traMADol (ULTRAM) 50 MG tablet Take 50 mg by mouth every 6 (six) hours as needed.    . traZODone (DESYREL) 50 MG tablet Take 25 mg by mouth at bedtime.   . vitamin B-12 (CYANOCOBALAMIN) 1000 MCG tablet Take 1,000 mcg by mouth daily.  . vitamin C (ASCORBIC ACID) 500 MG tablet Take 500 mg by mouth daily.  Marland Kitchen zolpidem (AMBIEN) 10 MG tablet Take 10 mg by mouth at bedtime.   . [DISCONTINUED] famotidine (PEPCID) 20 MG tablet Take 2 tablets (40 mg total) by mouth at bedtime. When coughing  . [DISCONTINUED] Respiratory Therapy Supplies (FLUTTER) DEVI Use 4 times daily  . [DISCONTINUED] amoxicillin-clavulanate (AUGMENTIN) 875-125 MG per tablet Take 1 tablet by mouth 2 (two) times daily.  . [DISCONTINUED] budesonide-formoterol (SYMBICORT) 80-4.5 MCG/ACT inhaler Take 2 puffs first thing in am and then another 2 puffs about 12 hours later.  . [DISCONTINUED] doxycycline (VIBRA-TABS) 100 MG tablet Take 1 tablet (100 mg total) by mouth 2 (two) times daily.  . [DISCONTINUED] hydrochlorothiazide (HYDRODIURIL) 25 MG tablet Take 25 mg by mouth daily.  . [DISCONTINUED] NEXIUM 40 MG capsule Take 1 capsule by mouth Daily.  . [DISCONTINUED] pantoprazole (PROTONIX) 40 MG tablet Take 1 tablet (40 mg total) by mouth daily. Take 30-60 min before first meal of the day  . [DISCONTINUED] Vitamin D, Ergocalciferol, (DRISDOL) 50000 UNITS CAPS capsule Take 50,000 Units by mouth every 30 (thirty) days.

## 2013-10-25 NOTE — Patient Instructions (Signed)
Flu vaccine was given No change in medications Return 3 months

## 2013-10-26 NOTE — Assessment & Plan Note (Signed)
COPD gold stage C. Plan Flu vaccine was administered No change in inhaled medications

## 2013-10-30 ENCOUNTER — Telehealth: Payer: Self-pay | Admitting: Critical Care Medicine

## 2013-10-30 MED ORDER — BUDESONIDE-FORMOTEROL FUMARATE 160-4.5 MCG/ACT IN AERO: 2.0000 | INHALATION_SPRAY | Freq: Two times a day (BID) | RESPIRATORY_TRACT | Status: DC

## 2013-10-30 NOTE — Telephone Encounter (Signed)
Symbicort rx sent to pharm - pt aware and voiced no further questions or concerns at this time.

## 2013-12-14 ENCOUNTER — Encounter: Payer: Self-pay | Admitting: Adult Health

## 2013-12-14 ENCOUNTER — Ambulatory Visit (INDEPENDENT_AMBULATORY_CARE_PROVIDER_SITE_OTHER): Payer: Medicare HMO | Admitting: Adult Health

## 2013-12-14 VITALS — BP 130/70 | HR 68 | Temp 97.0°F | Ht 64.0 in | Wt 163.2 lb

## 2013-12-14 DIAGNOSIS — J441 Chronic obstructive pulmonary disease with (acute) exacerbation: Secondary | ICD-10-CM

## 2013-12-14 MED ORDER — METHYLPREDNISOLONE ACETATE 80 MG/ML IJ SUSP
80.0000 mg | Freq: Once | INTRAMUSCULAR | Status: AC
  Administered 2013-12-14: 80 mg via INTRAMUSCULAR

## 2013-12-14 MED ORDER — AMOXICILLIN-POT CLAVULANATE 875-125 MG PO TABS: 1.0000 | ORAL_TABLET | Freq: Two times a day (BID) | ORAL | Status: AC

## 2013-12-14 NOTE — Assessment & Plan Note (Signed)
Exacerbation   Plan  Augmentin 875mg  Twice daily  For 7 days, take with food.  Mucinex DM Twice daily  As needed  Cough/congestion  Depo Medrol shot today .  Fluids and rest  Please contact office for sooner follow up if symptoms do not improve or worsen or seek emergency care  Follow up Dr. Joya Gaskins  As planned and As needed

## 2013-12-14 NOTE — Patient Instructions (Addendum)
Augmentin 875mg  Twice daily  For 7 days, take with food.  Mucinex DM Twice daily  As needed  Cough/congestion  Depo Medrol shot today .  Fluids and rest  Please contact office for sooner follow up if symptoms do not improve or worsen or seek emergency care  Follow up Dr. Joya Gaskins  As planned and As needed

## 2013-12-14 NOTE — Progress Notes (Signed)
   Subjective:    Patient ID: Kristin Estrada, female    DOB: 07-15-1940, 73 y.o.   MRN: [id number]  HPI 73 yo female with COPD   12/14/2013 Acute OV  Patient presents for an acute office visit Patient complains wheezing, chest congestion, prod cough thick yellow for 1 week.  She has been using Mucinex without much relief. She denies any hemoptysis, chest pain, orthopnea, PND or leg swelling . No fever. No recent travel or antibiotic use. Last cxr 08/04/13 NAD .    Review of Systems Constitutional:   No  weight loss, night sweats,  Fevers, chills,  +fatigue, or  lassitude.  HEENT:   No headaches,  Difficulty swallowing,  Tooth/dental problems, or  Sore throat,                No sneezing, itching, ear ache,  +nasal congestion, post nasal drip,   CV:  No chest pain,  Orthopnea, PND, swelling in lower extremities, anasarca, dizziness, palpitations, syncope.   GI  No heartburn, indigestion, abdominal pain, nausea, vomiting, diarrhea, change in bowel habits, loss of appetite, bloody stools.   Resp:  No chest wall deformity  Skin: no rash or lesions.  GU: no dysuria, change in color of urine, no urgency or frequency.  No flank pain, no hematuria   MS:  No joint pain or swelling.  No decreased range of motion.  No back pain.  Psych:  No change in mood or affect. No depression or anxiety.  No memory loss.          Objective:   Physical Exam GEN: A/Ox3; pleasant , NAD, well nourished   HEENT:  [place]/AT,  EACs-clear, TMs-wnl, NOSE-clear, THROAT-clear, no lesions, no postnasal drip or exudate noted.   NECK:  Supple w/ fair ROM; no JVD; normal carotid impulses w/o bruits; no thyromegaly or nodules palpated; no lymphadenopathy.  RESP  Exp wheezing noted no accessory muscle use, no dullness to percussion  CARD:  RRR, no m/r/g  , no peripheral edema, pulses intact, no cyanosis or clubbing.  GI:   Soft & nt; nml bowel sounds; no organomegaly or masses detected.  Musco: Warm bil,  no deformities or joint swelling noted.   Neuro: alert, no focal deficits noted.    Skin: Warm, no lesions or rashes         Assessment & Plan:

## 2013-12-26 ENCOUNTER — Telehealth: Payer: Self-pay | Admitting: Critical Care Medicine

## 2013-12-26 MED ORDER — ALBUTEROL SULFATE HFA 108 (90 BASE) MCG/ACT IN AERS: 2.0000 | INHALATION_SPRAY | RESPIRATORY_TRACT | Status: AC | PRN

## 2013-12-26 NOTE — Telephone Encounter (Signed)
Called pt. Aware RX sent in. Nothing further needed 

## 2013-12-28 ENCOUNTER — Other Ambulatory Visit: Payer: Self-pay | Admitting: Critical Care Medicine

## 2013-12-28 MED ORDER — NYSTATIN 100000 UNIT/ML MT SUSP: OROMUCOSAL | Status: DC

## 2014-01-24 ENCOUNTER — Other Ambulatory Visit: Payer: Self-pay | Admitting: *Deleted

## 2014-01-24 MED ORDER — MOMETASONE FUROATE 50 MCG/ACT NA SUSP: 2.0000 | Freq: Every day | NASAL | Status: DC

## 2014-01-26 ENCOUNTER — Telehealth: Payer: Self-pay | Admitting: Critical Care Medicine

## 2014-01-26 NOTE — Telephone Encounter (Signed)
Received faxed surgery clearance request from [facility] for left shoulder: TSA Total shoulder.  Surgery not scheduled at this time.  Form completed by Dr. Joya Gaskins with recs "pt is cleared for surgery with the following recommendations of: needs o2 during procedure."  Form faxed to [facility] at [phone] and placed in scan folder.

## 2014-01-29 ENCOUNTER — Encounter: Payer: Self-pay | Admitting: Critical Care Medicine

## 2014-01-29 ENCOUNTER — Ambulatory Visit (INDEPENDENT_AMBULATORY_CARE_PROVIDER_SITE_OTHER): Payer: Medicare HMO | Admitting: Critical Care Medicine

## 2014-01-29 VITALS — BP 150/74 | HR 67 | Temp 98.2°F | Ht 64.0 in | Wt 158.6 lb

## 2014-01-29 DIAGNOSIS — J441 Chronic obstructive pulmonary disease with (acute) exacerbation: Secondary | ICD-10-CM

## 2014-01-29 MED ORDER — FAMOTIDINE 20 MG PO TABS: 20.0000 mg | ORAL_TABLET | Freq: Two times a day (BID) | ORAL | Status: AC

## 2014-01-29 NOTE — Patient Instructions (Signed)
No change in medications You are cleared for planned surgery Stop Nexium Start pepcid 20mg  twice daily Return 4 months

## 2014-01-29 NOTE — Progress Notes (Signed)
Subjective:    Patient ID: Kristin Estrada, female    DOB: 11-25-1940, 73 y.o.   MRN: [id number]  HPI 73 y.o.  female with COPD Gold B ex smoker not on oxygen, GERD major issue   01/29/2014 Chief Complaint  Patient presents with  . Follow-up    Feels breathing is at baseline.  Has Occas nonprod cough, wheezing mostly qhs, and chest soreness - relating this to heartburn.    Needs a Left shoulder replacement.   More recently doing ok.  Notes sl wheezing qhs.  Pt had cramps in legs, went off nexium then noted more soreness in chest.   No new issues.   Pt denies any significant sore throat, nasal congestion or excess secretions, fever, chills, sweats, unintended weight loss, pleurtic or exertional chest pain, orthopnea PND, or leg swelling Pt denies any increase in rescue therapy over baseline, denies waking up needing it or having any early am or nocturnal exacerbations of coughing/wheezing/or dyspnea. Pt also denies any obvious fluctuation in symptoms with  weather or environmental change or other alleviating or aggravating factors    Review of Systems Constitutional:   No  weight loss, night sweats,  Fevers, chills,  +fatigue, or  lassitude.  HEENT:   No headaches,  Difficulty swallowing,  Tooth/dental problems, or  Sore throat,                No sneezing, itching, ear ache,  No nasal congestion, post nasal drip,   CV:  No chest pain,  Orthopnea, PND, swelling in lower extremities, anasarca, dizziness, palpitations, syncope.   GI  No heartburn, indigestion, abdominal pain, nausea, vomiting, diarrhea, change in bowel habits, loss of appetite, bloody stools.   Resp:  No chest wall deformity  Skin: no rash or lesions.  GU: no dysuria, change in color of urine, no urgency or frequency.  No flank pain, no hematuria   MS:  No joint pain or swelling.  No decreased range of motion.  No back pain.  Psych:  No change in mood or affect. No depression or anxiety.  No memory loss.     Objective:   Physical Exam BP 150/74 mmHg  Pulse 67  Temp(Src) 98.2 F (36.8 C) (Oral)  Ht 5\' 4"  (1.626 m)  Wt 158 lb 9.6 oz (71.94 kg)  BMI 27.21 kg/m2  SpO2 96%  GEN: A/Ox3; pleasant , NAD, well nourished   HEENT:  [place]/AT,  EACs-clear, TMs-wnl, NOSE-clear, THROAT-clear, no lesions, no postnasal drip or exudate noted.   NECK:  Supple w/ fair ROM; no JVD; normal carotid impulses w/o bruits; no thyromegaly or nodules palpated; no lymphadenopathy.  RESP  Distant BS, no wheezingno accessory muscle use, no dullness to percussion  CARD:  RRR, no m/r/g  , no peripheral edema, pulses intact, no cyanosis or clubbing.  GI:   Soft & nt; nml bowel sounds; no organomegaly or masses detected.  Musco: Warm bil, no deformities or joint swelling noted.   Neuro: alert, no focal deficits noted.    Skin: Warm, no lesions or rashes   amb sats 93%       Assessment & Plan:   Obstructive chronic bronchitis with exacerbation gold stage B Gold B Copd with improved airway function on current inhaler program Note adverse side effects with PPIs Pt needs shoulder replacement surgery  Plan The pt is cleared from pulmonary standpoint for planned surgery on shoulder Stay on inhalers No oxygen needed except for surgery Stop Nexium Start pepcid 20mg   twice daily Return 4 months    Updated Medication List Outpatient Encounter Prescriptions as of 01/29/2014  Medication Sig  . albuterol (PROAIR HFA) 108 (90 BASE) MCG/ACT inhaler Inhale 2 puffs into the lungs every 4 (four) hours as needed.  Marland Kitchen albuterol (PROVENTIL) (2.5 MG/3ML) 0.083% nebulizer solution Take 2.5 mg by nebulization as needed. Take 2.5 mg by nebulization 2 (two) times daily. And as needed  . amLODipine (NORVASC) 10 MG tablet Take 10 mg by mouth daily.  . Bevacizumab (AVASTIN IV) Inject into the vein. 1.25 mg injection into the right eye every 4 wks  . budesonide-formoterol (SYMBICORT) 160-4.5 MCG/ACT inhaler Inhale 2 puffs into  the lungs 2 (two) times daily.  . cetirizine (ZYRTEC) 10 MG tablet Take 10 mg by mouth at bedtime.   . chlorpheniramine-HYDROcodone (TUSSIONEX PENNKINETIC ER) 10-8 MG/5ML LQCR Take 5 mLs by mouth every 12 (twelve) hours as needed.  . cholecalciferol (VITAMIN D) 1000 UNITS tablet Take 1,000 Units by mouth daily.  . Cinnamon 500 MG capsule Take 500 mg by mouth daily.  . Coenzyme Q10 (CO Q 10) 10 MG CAPS Take 1 capsule by mouth daily.    Marland Kitchen dextromethorphan-guaiFENesin (MUCINEX DM) 30-600 MG per 12 hr tablet Take 1 tablet by mouth every 12 (twelve) hours as needed.   . diclofenac (VOLTAREN) 75 MG EC tablet Take 75 mg by mouth 2 (two) times daily.    . famotidine (PEPCID) 20 MG tablet Take 1 tablet (20 mg total) by mouth 2 (two) times daily. When coughing  . Homeopathic Products ([employer]S LEG CRAMPS PAIN RELIEF PO) Per bottle as needed  . ipratropium (ATROVENT) 0.02 % nebulizer solution Take 500 mcg by nebulization as needed. And as needed  . Magnesium 400 MG CAPS Take 4 capsules by mouth daily.   . mometasone (NASONEX) 50 MCG/ACT nasal spray Place 2 sprays into the nose daily.  Marland Kitchen nystatin (MYCOSTATIN) 100000 UNIT/ML suspension Use 5 ml (cc) three times a day as needed.  Marland Kitchen Respiratory Therapy Supplies (FLUTTER) DEVI as needed.  . traMADol (ULTRAM) 50 MG tablet Take 50 mg by mouth every 6 (six) hours as needed.    . traZODone (DESYREL) 50 MG tablet Take 25 mg by mouth at bedtime.   . vitamin B-12 (CYANOCOBALAMIN) 1000 MCG tablet Take 1,000 mcg by mouth daily.  . vitamin C (ASCORBIC ACID) 500 MG tablet Take 500 mg by mouth daily.  Marland Kitchen zolpidem (AMBIEN) 10 MG tablet Take 10 mg by mouth at bedtime.   . [DISCONTINUED] famotidine (PEPCID) 20 MG tablet Take 40 mg by mouth at bedtime as needed. When coughing

## 2014-01-30 NOTE — Assessment & Plan Note (Signed)
Gold B Copd with improved airway function on current inhaler program Note adverse side effects with PPIs Pt needs shoulder replacement surgery  Plan The pt is cleared from pulmonary standpoint for planned surgery on shoulder Stay on inhalers No oxygen needed except for surgery Stop Nexium Start pepcid 20mg  twice daily Return 4 months

## 2014-02-19 ENCOUNTER — Telehealth: Payer: Self-pay | Admitting: Critical Care Medicine

## 2014-02-19 MED ORDER — DEXLANSOPRAZOLE 60 MG PO CPDR: 60.0000 mg | DELAYED_RELEASE_CAPSULE | Freq: Every day | ORAL | Status: DC

## 2014-02-19 NOTE — Telephone Encounter (Signed)
Last OV 01-29-14. Pt states she has been having a lot of issues with reflux. She states she used to be on medication for this but stopped it at last OV. I advised her to call PCP, but she states she is between PCPs at this time. At last OV pt was prescribed pepcid but pt states this does not seem to be helping her reflux.  She states Dr. Joya Gaskins prescribed her [place] in the past and this worked better for her. Pt is asking for an rx for this. Please advisee. Constantine Bing, CMA   Allergies  Allergen Reactions  . Nexium [Esomeprazole Magnesium] Other (See Comments)    Abdominal cramps  . Protonix [Pantoprazole Sodium]     abd cramps  . Prednisone     REACTION: agitation, insomina, mood change

## 2014-02-19 NOTE — Telephone Encounter (Signed)
Called and spoke to pt. Informed pt of the recs per PW. Rx sent to preferred pharmacy. Pt verbalized understanding and denied any further questions or concerns at this time.

## 2014-02-19 NOTE — Telephone Encounter (Signed)
i am ok with Rx Dexilant 60mg  daily #30 6 RF

## 2014-02-20 ENCOUNTER — Inpatient Hospital Stay (HOSPITAL_COMMUNITY): Admission: RE | Admit: 2014-02-20 | Payer: Medicare HMO | Source: Ambulatory Visit

## 2014-02-20 ENCOUNTER — Telehealth: Payer: Self-pay | Admitting: Critical Care Medicine

## 2014-02-20 NOTE — Telephone Encounter (Signed)
I spoke with the pt and she states she called yesterday to get a reflux medication because she felt like she was having increased reflux. She states she still feels like she is having this but she is now having an increase in her cough and come chest congestion. She is requesting an appt for Thursday or Friday this week. Appt set for Thursday at 9:30 am. [place] Bing, CMA

## 2014-02-22 ENCOUNTER — Ambulatory Visit (INDEPENDENT_AMBULATORY_CARE_PROVIDER_SITE_OTHER): Payer: Medicare HMO | Admitting: Adult Health

## 2014-02-22 ENCOUNTER — Encounter: Payer: Self-pay | Admitting: Adult Health

## 2014-02-22 VITALS — BP 126/64 | HR 68 | Temp 98.2°F | Ht 64.0 in | Wt 158.0 lb

## 2014-02-22 DIAGNOSIS — J441 Chronic obstructive pulmonary disease with (acute) exacerbation: Secondary | ICD-10-CM

## 2014-02-22 MED ORDER — METHYLPREDNISOLONE ACETATE 80 MG/ML IJ SUSP
80.0000 mg | Freq: Once | INTRAMUSCULAR | Status: AC
  Administered 2014-02-22: 80 mg via INTRAMUSCULAR

## 2014-02-22 MED ORDER — ESOMEPRAZOLE MAGNESIUM 40 MG PO CPDR: 40.0000 mg | DELAYED_RELEASE_CAPSULE | Freq: Every day | ORAL | Status: DC

## 2014-02-22 MED ORDER — AMOXICILLIN-POT CLAVULANATE 875-125 MG PO TABS
1.0000 | ORAL_TABLET | Freq: Two times a day (BID) | ORAL | Status: DC
Start: 2014-02-22 — End: 2014-10-15

## 2014-02-22 NOTE — Patient Instructions (Addendum)
Augmentin 875mg  Twice daily  For 7 days, take with food.  Mucinex DM Twice daily  As needed  Cough/congestion  Restart Nexium 40mg  daily  GERD diet  Depo Medrol shot today .  Fluids and rest  Please contact office for sooner follow up if symptoms do not improve or worsen or seek emergency care  Follow up Dr. Joya Gaskins  As planned and As needed

## 2014-02-22 NOTE — Addendum Note (Signed)
Addended by: Parke Poisson E on: 02/22/2014 12:35 PM   Modules accepted: Orders, Medications

## 2014-02-22 NOTE — Assessment & Plan Note (Signed)
[place] +GERD  Unable to take oral steroids   Plan  Augmentin 875mg  Twice daily  For 7 days, take with food.  Mucinex DM Twice daily  As needed  Cough/congestion  Restart Nexium 40mg  daily  GERD diet  Depo Medrol shot today .  Fluids and rest  Please contact office for sooner follow up if symptoms do not improve or worsen or seek emergency care  Follow up Dr. Joya Gaskins  As planned and As needed

## 2014-02-22 NOTE — Progress Notes (Signed)
   Subjective:    Patient ID: Kristin Estrada, female    DOB: 1940/11/29, 74 y.o.   MRN: [id number]  HPI 50   female with COPD Gold B ex smoker not on oxygen, GERD major issue   02/22/2014 Acute OV  Patient presents for an acute office visit Patient complains of sneezing, runny nose w/ clear drainage, PND, prod cough with yellow/white mucus, some wheezing and dyspnea x3 -5 days.  Denies f/c/s, n/v/d, hemoptysis. For shoulder surgery next week, rescheduled for later date as she is sick.   Stopped her Nexium , more reflux since stopping , wants to restart.  She denies any chest pain, orthopnea, PND or leg swelling.. Not taking otc for cough.  Remains on Symbicort Twice daily     Review of Systems Constitutional:   No  weight loss, night sweats,  Fevers, chills, fatigue, or  lassitude.  HEENT:   No headaches,  Difficulty swallowing,  Tooth/dental problems, or  Sore throat,                No sneezing, itching, ear ache,  +nasal congestion, post nasal drip,   CV:  No chest pain,  Orthopnea, PND, swelling in lower extremities, anasarca, dizziness, palpitations, syncope.   GI  +heartburn, indigestion,  No abdominal pain, nausea, vomiting, diarrhea, change in bowel habits, loss of appetite, bloody stools.   Resp:  .  No chest wall deformity  Skin: no rash or lesions.  GU: no dysuria, change in color of urine, no urgency or frequency.  No flank pain, no hematuria   MS:  No joint pain or swelling.  No decreased range of motion.  No back pain.  Psych:  No change in mood or affect. No depression or anxiety.  No memory loss.         Objective:   Physical Exam GEN: A/Ox3; pleasant , NAD, elderly   HEENT:  [place]/AT,  EACs-clear, TMs-wnl, NOSE-clear, THROAT-clear, no lesions, no postnasal drip or exudate noted.   NECK:  Supple w/ fair ROM; no JVD; normal carotid impulses w/o bruits; no thyromegaly or nodules palpated; no lymphadenopathy.  RESP  Few exp wheezes , no accessory muscle  use, no dullness to percussion  CARD:  RRR, no m/r/g  , no peripheral edema, pulses intact, no cyanosis or clubbing.  GI:   Soft & nt; nml bowel sounds; no organomegaly or masses detected.  Musco: Warm bil, no deformities or joint swelling noted.   Neuro: alert, no focal deficits noted.    Skin: Warm, no lesions or rashes         Assessment & Plan:

## 2014-03-01 ENCOUNTER — Encounter (HOSPITAL_COMMUNITY): Admission: RE | Payer: Self-pay | Source: Ambulatory Visit

## 2014-03-01 ENCOUNTER — Ambulatory Visit (HOSPITAL_COMMUNITY): Admission: RE | Admit: 2014-03-01 | Payer: Medicare HMO | Source: Ambulatory Visit | Admitting: Orthopedic Surgery

## 2014-03-01 SURGERY — ARTHROPLASTY, SHOULDER, TOTAL
Anesthesia: General | Site: Shoulder | Laterality: Left

## 2014-04-10 ENCOUNTER — Encounter (HOSPITAL_COMMUNITY)
Admission: RE | Admit: 2014-04-10 | Discharge: 2014-04-10 | Disposition: A | Discharge status: Home / Self Care | Payer: Medicare HMO | Source: Ambulatory Visit | Attending: Orthopedic Surgery | Admitting: Orthopedic Surgery

## 2014-04-10 ENCOUNTER — Encounter (HOSPITAL_COMMUNITY): Payer: Self-pay

## 2014-04-10 ENCOUNTER — Encounter (HOSPITAL_COMMUNITY)
Admission: RE | Admit: 2014-04-10 | Discharge: 2014-04-10 | Disposition: A | Discharge status: Home / Self Care | Payer: Medicare HMO | Source: Ambulatory Visit | Attending: Anesthesiology | Admitting: Anesthesiology

## 2014-04-10 DIAGNOSIS — J449 Chronic obstructive pulmonary disease, unspecified: Secondary | ICD-10-CM

## 2014-04-10 DIAGNOSIS — J42 Unspecified chronic bronchitis: Secondary | ICD-10-CM | POA: Insufficient documentation

## 2014-04-10 DIAGNOSIS — J069 Acute upper respiratory infection, unspecified: Secondary | ICD-10-CM

## 2014-04-10 DIAGNOSIS — Z01818 Encounter for other preprocedural examination: Secondary | ICD-10-CM | POA: Insufficient documentation

## 2014-04-10 HISTORY — DX: Urgency of urination: R39.15

## 2014-04-10 HISTORY — DX: Headache, unspecified: R51.9

## 2014-04-10 HISTORY — DX: Unspecified osteoarthritis, unspecified site: M19.90

## 2014-04-10 HISTORY — DX: Headache: R51

## 2014-04-10 HISTORY — DX: Type 2 diabetes mellitus without complications: E11.9

## 2014-04-10 HISTORY — DX: Other skin changes: R23.8

## 2014-04-10 HISTORY — DX: Effusion, unspecified joint: M25.40

## 2014-04-10 HISTORY — DX: Hyperlipidemia, unspecified: E78.5

## 2014-04-10 HISTORY — DX: Spontaneous ecchymoses: R23.3

## 2014-04-10 HISTORY — DX: Insomnia, unspecified: G47.00

## 2014-04-10 HISTORY — DX: Dorsalgia, unspecified: M54.9

## 2014-04-10 HISTORY — DX: Other chronic pain: G89.29

## 2014-04-10 HISTORY — DX: Other seasonal allergic rhinitis: J30.2

## 2014-04-10 HISTORY — DX: Pneumonia, unspecified organism: J18.9

## 2014-04-10 HISTORY — DX: Unspecified chronic bronchitis: J42

## 2014-04-10 HISTORY — DX: Unspecified cataract: H26.9

## 2014-04-10 HISTORY — DX: Acute upper respiratory infection, unspecified: J06.9

## 2014-04-10 HISTORY — DX: Unspecified macular degeneration: H35.30

## 2014-04-10 HISTORY — DX: Pain in unspecified joint: M25.50

## 2014-04-10 LAB — BASIC METABOLIC PANEL
Anion gap: 11 (ref 5–15)
BUN: 17 mg/dL (ref 6–23)
CO2: 25 mmol/L (ref 19–32)
CREATININE: 0.61 mg/dL (ref 0.50–1.10)
Calcium: 9 mg/dL (ref 8.4–10.5)
Chloride: 104 mmol/L (ref 96–112)
GFR calc Af Amer: >90 mL/min (ref >90)
GFR calc non Af Amer: 87 mL/min — ABNORMAL LOW (ref >90)
Glucose, Bld: 107 mg/dL — ABNORMAL HIGH (ref 70–99)
POTASSIUM: 4.1 mmol/L (ref 3.5–5.1)
Sodium: 140 mmol/L (ref 135–145)

## 2014-04-10 LAB — CBC
HCT: 43.4 % (ref 36.0–46.0)
HEMOGLOBIN: 14.4 g/dL (ref 12.0–15.0)
MCH: 28.9 pg (ref 26.0–34.0)
MCHC: 33.2 g/dL (ref 30.0–36.0)
MCV: 87.1 fL (ref 78.0–100.0)
PLATELETS: 353 10*3/uL (ref 150–400)
RBC: 4.98 MIL/uL (ref 3.87–5.11)
RDW: 14.9 % (ref 11.5–15.5)
WBC: 10 10*3/uL (ref 4.0–10.5)

## 2014-04-10 LAB — PROTIME-INR
INR: 0.91 (ref 0.00–1.49)
PROTHROMBIN TIME: 12.4 s (ref 11.6–15.2)

## 2014-04-10 LAB — SURGICAL PCR SCREEN
MRSA, PCR: NEGATIVE
Staphylococcus aureus: NEGATIVE

## 2014-04-10 LAB — APTT: aPTT: 37 seconds (ref 24–37)

## 2014-04-10 MED ORDER — CHLORHEXIDINE GLUCONATE 4 % EX LIQD: 60.0000 mL | Freq: Once | CUTANEOUS | Status: DC

## 2014-04-10 NOTE — Progress Notes (Addendum)
Medical Md is with [facility] is pulmonologist  CXR in epic from 08-04-13  Pt doesn't have a cardiologist  Denies ever having an echo/stress test/heart cath

## 2014-04-10 NOTE — Pre-Procedure Instructions (Signed)
ROMIE KEEBLE  04/10/2014   Your procedure is scheduled on:  Thurs, Mar 3 @ 7:30 AM  Report to [facility]  at 5:30 AM.  Call this number if you have problems the morning of surgery: [phone]   Remember:   Do not eat food or drink liquids after midnight.   Take these medicines the morning of surgery with A SIP OF WATER: Albuterol<Bring Your Inhaler With You> Amlodipine(Norvasc),Augmentin(Amoxicillin),Symbicort,Dexilant(Dexlansoprazole),Pepcid(Famotidine)Atrovent,Nasonex(Mometasone),and Tramadol(Ultram)               Stop taking any Vitamins or Herbal Medications. No Goody's,BC's,Aleve,Ibuprofen,or Fish Oil.    Do not wear jewelry, make-up or nail polish.  Do not wear lotions, powders, or perfumes. You may wear deodorant.  Do not shave 48 hours prior to surgery.   Do not bring valuables to the hospital.  [facility] is not responsible                  for any belongings or valuables.               Contacts, dentures or bridgework may not be worn into surgery.  Leave suitcase in the car. After surgery it may be brought to your room.  For patients admitted to the hospital, discharge time is determined by your                treatment team.                Special Instructions:   - Preparing for Surgery  Before surgery, you can play an important role.  Because skin is not sterile, your skin needs to be as free of germs as possible.  You can reduce the number of germs on you skin by washing with CHG (chlorahexidine gluconate) soap before surgery.  CHG is an antiseptic cleaner which kills germs and bonds with the skin to continue killing germs even after washing.  Please DO NOT use if you have an allergy to CHG or antibacterial soaps.  If your skin becomes reddened/irritated stop using the CHG and inform your nurse when you arrive at Short Stay.  Do not shave (including legs and underarms) for at least 48 hours prior to the first CHG shower.  You may shave your  face.  Please follow these instructions carefully:   1.  Shower with CHG Soap the night before surgery and the                                morning of Surgery.  2.  If you choose to wash your hair, wash your hair first as usual with your       normal shampoo.  3.  After you shampoo, rinse your hair and body thoroughly to remove the                      Shampoo.  4.  Use CHG as you would any other liquid soap.  You can apply chg directly       to the skin and wash gently with scrungie or a clean washcloth.  5.  Apply the CHG Soap to your body ONLY FROM THE NECK DOWN.        Do not use on open wounds or open sores.  Avoid contact with your eyes,       ears, mouth and genitals (private parts).  Wash genitals (private parts)  with your normal soap.  6.  Wash thoroughly, paying special attention to the area where your surgery        will be performed.  7.  Thoroughly rinse your body with warm water from the neck down.  8.  DO NOT shower/wash with your normal soap after using and rinsing off       the CHG Soap.  9.  Pat yourself dry with a clean towel.            10.  Wear clean pajamas.            11.  Place clean sheets on your bed the night of your first shower and do not        sleep with pets.  Day of Surgery  Do not apply any lotions/deoderants the morning of surgery.  Please wear clean clothes to the hospital/surgery center.     Please read over the following fact sheets that you were given: Pain Booklet, Coughing and Deep Breathing, MRSA Information and Surgical Site Infection Prevention

## 2014-04-11 ENCOUNTER — Encounter (HOSPITAL_COMMUNITY): Payer: Self-pay

## 2014-04-11 NOTE — Progress Notes (Addendum)
Anesthesia Chart Review:  Patient is a 74 year old female scheduled for left total shoulder versus reverse total shoulder on 04/19/14 by Dr. Onnie Graham.  History includes former smoker (60 pack years), COPD Gold B/chronic bronchitis, insomnia, HTN, HLD, DM2, right eye macular degeneration, bruises easily, reflux. PCP is with [employer].  Pulmonologist is Dr. Joya Gaskins.  Meds include albuterol, amlodipine, AVASTIN (inject in right eye every 6 weeks, last dose 04/11/14), Symbicort, Zyrtec, Dexilant, Nexium, Pepcid, Atrovent, Nasonex, trazodone, Ambien.    Note from Dr. Joya Gaskins on 01/30/14 states:  Clayborn Heron Copd with improved airway function on current inhaler program Note adverse side effects with PPIs Pt needs shoulder replacement surgery  Plan The pt is cleared from pulmonary standpoint for planned surgery on shoulder Stay on inhalers No oxygen needed except for surgery Stop Nexium Start pepcid 20mg  twice daily Return 4 months  01/29/14 PFTs: FVC 2.14 (77%), FEV1 1.25 (60%), FEF 25-75% 0.39 (23%). Moderate airway obstruction.  EKG 04/10/14: NSR.  04/10/14 CXR: Stable chest exam. No interval change or superimposed acute process.   Preoperative labs noted.   I have attempted to contact patient to confirm last Avastin dose, as current recommendations are to delay major elective surgery for 28 days after last dose.  I spoke with Jaclyn Shaggy PharmD at [facility] ext [phone].  Drug information did not indicate if risks were dose dependent. She recommended someone contacting their department again once dose, timing, prescriber, etc confirmed with patient.  I have updated Abigail Butts at Dr. Susie Cassette office who is also attempting to reach patient.    George Hugh [facility] Short Stay Center/Anesthesiology Phone [phone] 04/11/2014 4:11 PM  Addendum: I received a phone call from Abigail Butts at Dr. Susie Cassette office.  Dr. Onnie Graham had already asked patient to discuss plans for surgery and timing of Avastin with  her ophthalmologist Dr. Oval Linsey [facility]). She spoke with Dr. Oval Linsey yesterday and was told her 1.25 mg of intravitreal Avastin should not affect her upcoming shoulder surgery. (She received 1.25 mg intravitreal, not an IV dose that can be 5-10 mg/kg when used in the treatment of metastatic cancer.)  I discussed with anesthesiologist Dr. Tobias Alexander. If Dr. Onnie Graham is aware of surgical risks of Avastin and is okay proceeding based on input from Dr. Oval Linsey then would anticipate patient could proceed as planned.  I have left a message with Abigail Butts relating above.  George Hugh [facility] Short Stay Center/Anesthesiology Phone [phone] 04/12/2014 2:44 PM

## 2014-04-18 MED ORDER — CEFAZOLIN SODIUM-DEXTROSE 2-3 GM-% IV SOLR
2.0000 g | INTRAVENOUS | Status: AC
  Administered 2014-04-19: 2 g via INTRAVENOUS
  Filled 2014-04-18: qty 50

## 2014-04-19 ENCOUNTER — Inpatient Hospital Stay (HOSPITAL_COMMUNITY)
Admission: RE | Admit: 2014-04-19 | Discharge: 2014-04-20 | DRG: 483 | Disposition: A | Discharge status: Home / Self Care | Payer: Medicare HMO | Source: Ambulatory Visit | Attending: Orthopedic Surgery | Admitting: Orthopedic Surgery

## 2014-04-19 ENCOUNTER — Encounter (HOSPITAL_COMMUNITY): Payer: Self-pay | Admitting: Surgery

## 2014-04-19 ENCOUNTER — Inpatient Hospital Stay (HOSPITAL_COMMUNITY): Payer: Medicare HMO | Admitting: Anesthesiology

## 2014-04-19 ENCOUNTER — Inpatient Hospital Stay (HOSPITAL_COMMUNITY): Payer: Medicare HMO | Admitting: Vascular Surgery

## 2014-04-19 ENCOUNTER — Encounter (HOSPITAL_COMMUNITY)
Admission: RE | Disposition: A | Discharge status: Home / Self Care | Payer: Self-pay | Source: Ambulatory Visit | Attending: Orthopedic Surgery

## 2014-04-19 DIAGNOSIS — Z79899 Other long term (current) drug therapy: Secondary | ICD-10-CM | POA: Diagnosis not present

## 2014-04-19 DIAGNOSIS — M87812 Other osteonecrosis, left shoulder: Secondary | ICD-10-CM | POA: Diagnosis present

## 2014-04-19 DIAGNOSIS — E785 Hyperlipidemia, unspecified: Secondary | ICD-10-CM | POA: Diagnosis present

## 2014-04-19 DIAGNOSIS — J449 Chronic obstructive pulmonary disease, unspecified: Secondary | ICD-10-CM | POA: Diagnosis present

## 2014-04-19 DIAGNOSIS — K219 Gastro-esophageal reflux disease without esophagitis: Secondary | ICD-10-CM | POA: Diagnosis present

## 2014-04-19 DIAGNOSIS — H3530 Unspecified macular degeneration: Secondary | ICD-10-CM | POA: Diagnosis present

## 2014-04-19 DIAGNOSIS — G8929 Other chronic pain: Secondary | ICD-10-CM | POA: Diagnosis present

## 2014-04-19 DIAGNOSIS — I10 Essential (primary) hypertension: Secondary | ICD-10-CM | POA: Diagnosis present

## 2014-04-19 DIAGNOSIS — M549 Dorsalgia, unspecified: Secondary | ICD-10-CM | POA: Diagnosis present

## 2014-04-19 DIAGNOSIS — Z87891 Personal history of nicotine dependence: Secondary | ICD-10-CM

## 2014-04-19 DIAGNOSIS — Z96619 Presence of unspecified artificial shoulder joint: Secondary | ICD-10-CM

## 2014-04-19 DIAGNOSIS — E119 Type 2 diabetes mellitus without complications: Secondary | ICD-10-CM | POA: Diagnosis present

## 2014-04-19 DIAGNOSIS — M87822 Other osteonecrosis, left humerus: Principal | ICD-10-CM | POA: Diagnosis present

## 2014-04-19 DIAGNOSIS — J302 Other seasonal allergic rhinitis: Secondary | ICD-10-CM | POA: Diagnosis present

## 2014-04-19 DIAGNOSIS — G4700 Insomnia, unspecified: Secondary | ICD-10-CM | POA: Diagnosis present

## 2014-04-19 HISTORY — PX: REVERSE SHOULDER ARTHROPLASTY: SHX5054

## 2014-04-19 LAB — GLUCOSE, CAPILLARY
Glucose-Capillary: 101 mg/dL — ABNORMAL HIGH (ref 70–99)
Glucose-Capillary: 129 mg/dL — ABNORMAL HIGH (ref 70–99)
Glucose-Capillary: 93 mg/dL (ref 70–99)

## 2014-04-19 SURGERY — ARTHROPLASTY, SHOULDER, TOTAL, REVERSE
Anesthesia: General | Site: Shoulder | Laterality: Left

## 2014-04-19 MED ORDER — ROCURONIUM BROMIDE 100 MG/10ML IV SOLN
INTRAVENOUS | Status: DC | PRN
  Administered 2014-04-19: 40 mg via INTRAVENOUS

## 2014-04-19 MED ORDER — DOCUSATE SODIUM 100 MG PO CAPS
100.0000 mg | ORAL_CAPSULE | Freq: Two times a day (BID) | ORAL | Status: DC
  Administered 2014-04-19 – 2014-04-20 (×2): 100 mg via ORAL
  Filled 2014-04-19 (×3): qty 1

## 2014-04-19 MED ORDER — LORATADINE 10 MG PO TABS
10.0000 mg | ORAL_TABLET | Freq: Every day | ORAL | Status: DC
  Administered 2014-04-20: 10 mg via ORAL
  Filled 2014-04-19: qty 1

## 2014-04-19 MED ORDER — FLEET ENEMA 7-19 GM/118ML RE ENEM: 1.0000 | ENEMA | Freq: Once | RECTAL | Status: AC | PRN

## 2014-04-19 MED ORDER — SODIUM CHLORIDE 0.9 % IV SOLN
10.0000 mg | INTRAVENOUS | Status: DC | PRN
  Administered 2014-04-19: 10 ug/min via INTRAVENOUS

## 2014-04-19 MED ORDER — METOCLOPRAMIDE HCL 5 MG/ML IJ SOLN: 5.0000 mg | Freq: Three times a day (TID) | INTRAMUSCULAR | Status: DC | PRN

## 2014-04-19 MED ORDER — LOSARTAN POTASSIUM 25 MG PO TABS
25.0000 mg | ORAL_TABLET | Freq: Every day | ORAL | Status: DC
  Administered 2014-04-20: 25 mg via ORAL
  Filled 2014-04-19 (×2): qty 1

## 2014-04-19 MED ORDER — GLYCOPYRROLATE 0.2 MG/ML IJ SOLN
INTRAMUSCULAR | Status: AC
  Filled 2014-04-19: qty 3

## 2014-04-19 MED ORDER — ONDANSETRON HCL 4 MG/2ML IJ SOLN
INTRAMUSCULAR | Status: DC | PRN
  Administered 2014-04-19: 4 mg via INTRAVENOUS

## 2014-04-19 MED ORDER — DIPHENHYDRAMINE HCL 12.5 MG/5ML PO ELIX: 12.5000 mg | ORAL_SOLUTION | ORAL | Status: DC | PRN

## 2014-04-19 MED ORDER — FENTANYL CITRATE 0.05 MG/ML IJ SOLN
INTRAMUSCULAR | Status: AC
  Filled 2014-04-19: qty 5

## 2014-04-19 MED ORDER — ROPIVACAINE HCL 5 MG/ML IJ SOLN
INTRAMUSCULAR | Status: DC | PRN
  Administered 2014-04-19: 20 mL via PERINEURAL

## 2014-04-19 MED ORDER — FENTANYL CITRATE 0.05 MG/ML IJ SOLN
INTRAMUSCULAR | Status: DC | PRN
  Administered 2014-04-19: 50 ug via INTRAVENOUS

## 2014-04-19 MED ORDER — LIDOCAINE HCL (CARDIAC) 20 MG/ML IV SOLN
INTRAVENOUS | Status: AC
  Filled 2014-04-19: qty 5

## 2014-04-19 MED ORDER — NEOSTIGMINE METHYLSULFATE 10 MG/10ML IV SOLN
INTRAVENOUS | Status: DC | PRN
  Administered 2014-04-19: 2.5 mg via INTRAVENOUS

## 2014-04-19 MED ORDER — SODIUM CHLORIDE 0.9 % IJ SOLN
INTRAMUSCULAR | Status: AC
  Filled 2014-04-19: qty 10

## 2014-04-19 MED ORDER — ZOLPIDEM TARTRATE 5 MG PO TABS
5.0000 mg | ORAL_TABLET | Freq: Every day | ORAL | Status: DC
  Administered 2014-04-19: 5 mg via ORAL
  Filled 2014-04-19: qty 1

## 2014-04-19 MED ORDER — ROCURONIUM BROMIDE 50 MG/5ML IV SOLN
INTRAVENOUS | Status: AC
  Filled 2014-04-19: qty 1

## 2014-04-19 MED ORDER — IPRATROPIUM BROMIDE 0.02 % IN SOLN: 500.0000 ug | RESPIRATORY_TRACT | Status: DC | PRN

## 2014-04-19 MED ORDER — SODIUM CHLORIDE 0.9 % IR SOLN
Status: DC | PRN
  Administered 2014-04-19: 1000 mL

## 2014-04-19 MED ORDER — KETOROLAC TROMETHAMINE 15 MG/ML IJ SOLN
7.5000 mg | Freq: Four times a day (QID) | INTRAMUSCULAR | Status: DC
  Administered 2014-04-19 – 2014-04-20 (×3): 7.5 mg via INTRAVENOUS
  Filled 2014-04-19 (×3): qty 1

## 2014-04-19 MED ORDER — GLYCOPYRROLATE 0.2 MG/ML IJ SOLN
INTRAMUSCULAR | Status: AC
  Filled 2014-04-19: qty 2

## 2014-04-19 MED ORDER — OXYCODONE HCL 5 MG PO TABS: 5.0000 mg | ORAL_TABLET | Freq: Once | ORAL | Status: DC | PRN

## 2014-04-19 MED ORDER — LACTATED RINGERS IV SOLN: INTRAVENOUS | Status: DC

## 2014-04-19 MED ORDER — ACETAMINOPHEN 325 MG PO TABS
650.0000 mg | ORAL_TABLET | Freq: Four times a day (QID) | ORAL | Status: DC | PRN
  Administered 2014-04-19: 650 mg via ORAL
  Filled 2014-04-19: qty 2

## 2014-04-19 MED ORDER — BISACODYL 5 MG PO TBEC: 5.0000 mg | DELAYED_RELEASE_TABLET | Freq: Every day | ORAL | Status: DC | PRN

## 2014-04-19 MED ORDER — ONDANSETRON HCL 4 MG/2ML IJ SOLN: 4.0000 mg | Freq: Four times a day (QID) | INTRAMUSCULAR | Status: DC | PRN

## 2014-04-19 MED ORDER — ONDANSETRON HCL 4 MG PO TABS: 4.0000 mg | ORAL_TABLET | Freq: Four times a day (QID) | ORAL | Status: DC | PRN

## 2014-04-19 MED ORDER — TRAMADOL HCL 50 MG PO TABS: 50.0000 mg | ORAL_TABLET | Freq: Four times a day (QID) | ORAL | Status: DC | PRN

## 2014-04-19 MED ORDER — LIDOCAINE HCL (CARDIAC) 20 MG/ML IV SOLN
INTRAVENOUS | Status: DC | PRN
  Administered 2014-04-19: 20 mg via INTRAVENOUS

## 2014-04-19 MED ORDER — METOCLOPRAMIDE HCL 10 MG PO TABS: 5.0000 mg | ORAL_TABLET | Freq: Three times a day (TID) | ORAL | Status: DC | PRN

## 2014-04-19 MED ORDER — ONDANSETRON HCL 4 MG/2ML IJ SOLN
INTRAMUSCULAR | Status: AC
  Filled 2014-04-19: qty 2

## 2014-04-19 MED ORDER — LACTATED RINGERS IV SOLN
INTRAVENOUS | Status: DC | PRN
  Administered 2014-04-19: 07:00:00 via INTRAVENOUS

## 2014-04-19 MED ORDER — PROPOFOL 10 MG/ML IV BOLUS
INTRAVENOUS | Status: DC | PRN
  Administered 2014-04-19: 110 mg via INTRAVENOUS

## 2014-04-19 MED ORDER — PROPOFOL 10 MG/ML IV BOLUS
INTRAVENOUS | Status: AC
Start: 2014-04-19 — End: 2014-04-19
  Filled 2014-04-19: qty 20

## 2014-04-19 MED ORDER — ALBUTEROL SULFATE (2.5 MG/3ML) 0.083% IN NEBU: 2.5000 mg | INHALATION_SOLUTION | RESPIRATORY_TRACT | Status: DC | PRN

## 2014-04-19 MED ORDER — HYDROMORPHONE HCL 1 MG/ML IJ SOLN
0.2500 mg | INTRAMUSCULAR | Status: DC | PRN
  Administered 2014-04-19: 0.5 mg via INTRAVENOUS

## 2014-04-19 MED ORDER — PROMETHAZINE HCL 25 MG/ML IJ SOLN: 6.2500 mg | INTRAMUSCULAR | Status: DC | PRN

## 2014-04-19 MED ORDER — MIDAZOLAM HCL 2 MG/2ML IJ SOLN
INTRAMUSCULAR | Status: AC
  Filled 2014-04-19: qty 2

## 2014-04-19 MED ORDER — MENTHOL 3 MG MT LOZG
1.0000 | LOZENGE | OROMUCOSAL | Status: DC | PRN
  Filled 2014-04-19: qty 9

## 2014-04-19 MED ORDER — ACETAMINOPHEN 650 MG RE SUPP: 650.0000 mg | Freq: Four times a day (QID) | RECTAL | Status: DC | PRN

## 2014-04-19 MED ORDER — POLYETHYLENE GLYCOL 3350 17 G PO PACK: 17.0000 g | PACK | Freq: Every day | ORAL | Status: DC | PRN

## 2014-04-19 MED ORDER — OXYCODONE HCL 5 MG/5ML PO SOLN: 5.0000 mg | Freq: Once | ORAL | Status: DC | PRN

## 2014-04-19 MED ORDER — ALUM & MAG HYDROXIDE-SIMETH 200-200-20 MG/5ML PO SUSP: 30.0000 mL | ORAL | Status: DC | PRN

## 2014-04-19 MED ORDER — CEFAZOLIN SODIUM 1-5 GM-% IV SOLN
1.0000 g | Freq: Four times a day (QID) | INTRAVENOUS | Status: AC
  Administered 2014-04-19 – 2014-04-20 (×3): 1 g via INTRAVENOUS
  Filled 2014-04-19 (×4): qty 50

## 2014-04-19 MED ORDER — EPHEDRINE SULFATE 50 MG/ML IJ SOLN
INTRAMUSCULAR | Status: AC
Start: 2014-04-19 — End: 2014-04-19
  Filled 2014-04-19: qty 1

## 2014-04-19 MED ORDER — OXYCODONE HCL 5 MG PO TABS
5.0000 mg | ORAL_TABLET | ORAL | Status: DC | PRN
  Administered 2014-04-19 – 2014-04-20 (×4): 10 mg via ORAL
  Filled 2014-04-19 (×4): qty 2

## 2014-04-19 MED ORDER — PHENOL 1.4 % MT LIQD: 1.0000 | OROMUCOSAL | Status: DC | PRN

## 2014-04-19 MED ORDER — BUDESONIDE-FORMOTEROL FUMARATE 160-4.5 MCG/ACT IN AERO
2.0000 | INHALATION_SPRAY | Freq: Two times a day (BID) | RESPIRATORY_TRACT | Status: DC
Start: 2014-04-19 — End: 2014-04-20
  Administered 2014-04-20: 2 via RESPIRATORY_TRACT
  Filled 2014-04-19: qty 6

## 2014-04-19 MED ORDER — MIDAZOLAM HCL 5 MG/5ML IJ SOLN
INTRAMUSCULAR | Status: DC | PRN
  Administered 2014-04-19: 1 mg via INTRAVENOUS

## 2014-04-19 MED ORDER — HYDROMORPHONE HCL 1 MG/ML IJ SOLN
0.5000 mg | INTRAMUSCULAR | Status: DC | PRN
  Administered 2014-04-19: 1 mg via INTRAVENOUS
  Administered 2014-04-19: 0.5 mg via INTRAVENOUS
  Administered 2014-04-20: 1 mg via INTRAVENOUS
  Filled 2014-04-19 (×2): qty 1

## 2014-04-19 MED ORDER — DIAZEPAM 5 MG PO TABS: 2.5000 mg | ORAL_TABLET | Freq: Four times a day (QID) | ORAL | Status: DC | PRN

## 2014-04-19 MED ORDER — HYDROMORPHONE HCL 1 MG/ML IJ SOLN
INTRAMUSCULAR | Status: AC
  Filled 2014-04-19: qty 1

## 2014-04-19 MED ORDER — ZOLPIDEM TARTRATE 5 MG PO TABS: 10.0000 mg | ORAL_TABLET | Freq: Every day | ORAL | Status: DC

## 2014-04-19 MED ORDER — AMLODIPINE BESYLATE 10 MG PO TABS
10.0000 mg | ORAL_TABLET | Freq: Every day | ORAL | Status: DC
  Administered 2014-04-20: 10 mg via ORAL
  Filled 2014-04-19 (×2): qty 1

## 2014-04-19 MED ORDER — GLYCOPYRROLATE 0.2 MG/ML IJ SOLN
INTRAMUSCULAR | Status: DC | PRN
  Administered 2014-04-19: 0.4 mg via INTRAVENOUS

## 2014-04-19 MED ORDER — TRAZODONE HCL 50 MG PO TABS
25.0000 mg | ORAL_TABLET | Freq: Every day | ORAL | Status: DC
  Administered 2014-04-19: 25 mg via ORAL
  Filled 2014-04-19: qty 1

## 2014-04-19 SURGICAL SUPPLY — 84 items
ADH SKN CLS APL DERMABOND .7 (GAUZE/BANDAGES/DRESSINGS) ×1
ADH SKN CLS LQ APL DERMABOND (GAUZE/BANDAGES/DRESSINGS)
BIT DRILL 170X2.5X (BIT) IMPLANT
BIT DRL 170X2.5X (BIT)
BLADE SAW SGTL 83.5X18.5 (BLADE) ×3 IMPLANT
BRUSH FEMORAL CANAL (MISCELLANEOUS) IMPLANT
CAPT SHLDR REVTOTAL 1 ×2 IMPLANT
CEMENT BONE DEPUY (Cement) ×5 IMPLANT
COVER SURGICAL LIGHT HANDLE (MISCELLANEOUS) ×3 IMPLANT
DERMABOND ADHESIVE PROPEN (GAUZE/BANDAGES/DRESSINGS)
DERMABOND ADVANCED (GAUZE/BANDAGES/DRESSINGS) ×2
DERMABOND ADVANCED .7 DNX12 (GAUZE/BANDAGES/DRESSINGS) ×1 IMPLANT
DERMABOND ADVANCED .7 DNX6 (GAUZE/BANDAGES/DRESSINGS) ×1 IMPLANT
DRAPE IMP U-DRAPE 54X76 (DRAPES) ×3 IMPLANT
DRAPE INCISE IOBAN 66X45 STRL (DRAPES) ×3 IMPLANT
DRAPE ORTHO SPLIT 77X108 STRL (DRAPES) ×6
DRAPE SURG 17X11 SM STRL (DRAPES) ×3 IMPLANT
DRAPE SURG ORHT 6 SPLT 77X108 (DRAPES) ×2 IMPLANT
DRAPE U-SHAPE 47X51 STRL (DRAPES) ×3 IMPLANT
DRESSING AQUACEL AQ EXTRA 4X5 (GAUZE/BANDAGES/DRESSINGS) ×1 IMPLANT
DRILL 2.5 (BIT)
DRILL BIT 7/64X5 (BIT) ×3 IMPLANT
DRSG AQUACEL AG ADV 3.5X10 (GAUZE/BANDAGES/DRESSINGS) ×3 IMPLANT
DRSG MEPILEX BORDER 4X8 (GAUZE/BANDAGES/DRESSINGS) IMPLANT
DURAPREP 26ML APPLICATOR (WOUND CARE) ×4 IMPLANT
ELECT BLADE 4.0 EZ CLEAN MEGAD (MISCELLANEOUS) ×3
ELECT CAUTERY BLADE 6.4 (BLADE) ×3 IMPLANT
ELECT REM PT RETURN 9FT ADLT (ELECTROSURGICAL) ×3
ELECTRODE BLDE 4.0 EZ CLN MEGD (MISCELLANEOUS) ×1 IMPLANT
ELECTRODE REM PT RTRN 9FT ADLT (ELECTROSURGICAL) ×1 IMPLANT
FACESHIELD WRAPAROUND (MASK) ×6 IMPLANT
FACESHIELD WRAPAROUND OR TEAM (MASK) ×3 IMPLANT
GLOVE BIO SURGEON STRL SZ7.5 (GLOVE) ×3 IMPLANT
GLOVE BIO SURGEON STRL SZ8 (GLOVE) ×3 IMPLANT
GLOVE EUDERMIC 7 POWDERFREE (GLOVE) ×3 IMPLANT
GLOVE SS BIOGEL STRL SZ 7.5 (GLOVE) ×1 IMPLANT
GLOVE SUPERSENSE BIOGEL SZ 7.5 (GLOVE) ×2
GOWN STRL REUS W/ TWL LRG LVL3 (GOWN DISPOSABLE) ×1 IMPLANT
GOWN STRL REUS W/ TWL XL LVL3 (GOWN DISPOSABLE) ×2 IMPLANT
GOWN STRL REUS W/TWL LRG LVL3 (GOWN DISPOSABLE) ×3
GOWN STRL REUS W/TWL XL LVL3 (GOWN DISPOSABLE) ×6
HANDPIECE INTERPULSE COAX TIP (DISPOSABLE)
KIT BASIN OR (CUSTOM PROCEDURE TRAY) ×3 IMPLANT
KIT ROOM TURNOVER OR (KITS) ×3 IMPLANT
MANIFOLD NEPTUNE II (INSTRUMENTS) ×3 IMPLANT
NDL HYPO 25GX1X1/2 BEV (NEEDLE) IMPLANT
NDL SUT 6 .5 CRC .975X.05 MAYO (NEEDLE) ×1 IMPLANT
NEEDLE HYPO 25GX1X1/2 BEV (NEEDLE) IMPLANT
NEEDLE MAYO TAPER (NEEDLE) ×3
NS IRRIG 1000ML POUR BTL (IV SOLUTION) ×3 IMPLANT
PACK SHOULDER (CUSTOM PROCEDURE TRAY) ×3 IMPLANT
PAD ARMBOARD 7.5X6 YLW CONV (MISCELLANEOUS) ×6 IMPLANT
PASSER SUT SWANSON 36MM LOOP (INSTRUMENTS) IMPLANT
PIN GUIDE 1.2 (PIN) IMPLANT
PIN GUIDE GLENOPHERE 1.5MX300M (PIN) ×2 IMPLANT
PIN METAGLENE 2.5 (PIN) IMPLANT
PRESSURIZER FEMORAL UNIV (MISCELLANEOUS) IMPLANT
RESTRICTOR CEMENT PE SZ 2 (Cement) ×2 IMPLANT
SET HNDPC FAN SPRY TIP SCT (DISPOSABLE) IMPLANT
SLING ARM FOAM STRAP LRG (SOFTGOODS) IMPLANT
SLING ARM LRG ADULT FOAM STRAP (SOFTGOODS) ×2 IMPLANT
SLING ARM XL FOAM STRAP (SOFTGOODS) ×1 IMPLANT
SMARTMIX MINI TOWER (MISCELLANEOUS)
SPONGE LAP 18X18 X RAY DECT (DISPOSABLE) ×6 IMPLANT
SPONGE LAP 4X18 X RAY DECT (DISPOSABLE) ×3 IMPLANT
SUCTION FRAZIER TIP 10 FR DISP (SUCTIONS) ×3 IMPLANT
SUT BONE WAX W31G (SUTURE) IMPLANT
SUT FIBERWIRE #2 38 T-5 BLUE (SUTURE) ×6
SUT MNCRL AB 3-0 PS2 18 (SUTURE) ×3 IMPLANT
SUT VIC AB 1 CT1 27 (SUTURE) ×9
SUT VIC AB 1 CT1 27XBRD ANBCTR (SUTURE) ×3 IMPLANT
SUT VIC AB 2-0 CT1 27 (SUTURE) ×6
SUT VIC AB 2-0 CT1 TAPERPNT 27 (SUTURE) ×2 IMPLANT
SUT VIC AB 2-0 SH 27 (SUTURE)
SUT VIC AB 2-0 SH 27X BRD (SUTURE) IMPLANT
SUTURE FIBERWR #2 38 T-5 BLUE (SUTURE) ×2 IMPLANT
SYR 30ML SLIP (SYRINGE) ×3 IMPLANT
SYR CONTROL 10ML LL (SYRINGE) IMPLANT
TOWEL OR 17X24 6PK STRL BLUE (TOWEL DISPOSABLE) ×3 IMPLANT
TOWEL OR 17X26 10 PK STRL BLUE (TOWEL DISPOSABLE) ×3 IMPLANT
TOWER CARTRIDGE SMART MIX (DISPOSABLE) ×2 IMPLANT
TOWER SMARTMIX MINI (MISCELLANEOUS) ×1 IMPLANT
TRAY FOLEY CATH 16FRSI W/METER (SET/KITS/TRAYS/PACK) IMPLANT
WATER STERILE IRR 1000ML POUR (IV SOLUTION) ×1 IMPLANT

## 2014-04-19 NOTE — Progress Notes (Signed)
Report given to maria rn as caregiver 

## 2014-04-19 NOTE — Discharge Instructions (Signed)
° °  Kevin M. Supple, M.D., F.A.A.O.S. °Orthopaedic Surgery °Specializing in Arthroscopic and Reconstructive °Surgery of the Shoulder and Knee °[phone] °[address] - [place], [place] [address] - Fax [phone] ° ° °POST-OP TOTAL SHOULDER REPLACEMENT/SHOULDER HEMIARTHROPLASTY INSTRUCTIONS ° °1. Call the office at [phone] to schedule your first post-op appointment 10-14 days from the date of your surgery. ° °2. The bandage over your incision is waterproof. You may begin showering with this dressing on. You may leave this dressing on until first follow up appointment within 2 weeks. If you would like to remove it you may do so after the 5th day. Go slow and tug at the borders gently to break the bond the dressing has with the skin. The steri strips may come off with the dressing. At this point if there is no drainage it is okay to go without a bandage or you may cover it with a light guaze and tape. Leave the steri-strips in place over your incision. You can expect drainage that is bloody or yellow in nature that should gradually decrease from day of surgery. Change your dressing daily until drainage is completely resolved, then you may feel free to go without a bandage. You can also expect significant bruising around your shoulder that will drift down your arm and into your chest wall. This is very normal and should resolve over several days. ° ° 3. Wear your sling/immobilizer at all times except to perform the exercises below or to occasionally let your arm dangle by your side to stretch your elbow. You also need to sleep in your sling immobilizer until instructed otherwise. ° °4. Range of motion to your elbow, wrist, and hand are encouraged 3-5 times daily. Exercise to your hand and fingers helps to reduce swelling you may experience. ° °5. Utilize ice to the shoulder 3-5 times minimum a day and additionally if you are experiencing pain. ° °6. Prescriptions for a pain medication and a muscle  relaxant are provided for you. It is recommended that if you are experiencing pain that you pain medication alone is not controlling, add the muscle relaxant along with the pain medication which can give additional pain relief. The first 1-2 days is generally the most severe of your pain and then should gradually decrease. As your pain lessens it is recommended that you decrease your use of the pain medications to an "as needed basis'" only and to always comply with the recommended dosages of the pain medications. ° °7. Pain medications can produce constipation along with their use. If you experience this, the use of an over the counter stool softener or laxative daily is recommended.  ° °8. For most patients, if insurance allows, home health services to include therapy has been arranged. ° °9. For additional questions or concerns, please do not hesitate to call the office. If after hours there is an answering service to forward your concerns to the physician on call. ° °POST-OP EXERCISES ° °Pendulum Exercises ° °Perform pendulum exercises while standing and bending at the waist. Support your uninvolved arm on a table or chair and allow your operated arm to hang freely. Make sure to do these exercises passively - not using you shoulder muscles. ° °Repeat 20 times. Do 3 sessions per day. ° ° ° ° °

## 2014-04-19 NOTE — Anesthesia Postprocedure Evaluation (Signed)
  Anesthesia Post-op Note  Patient: Kristin Estrada  Procedure(s) Performed: Procedure(s):  REVERSE TOTAL SHOULDER ARTHROPLASTY (Left)  Patient Location: PACU  Anesthesia Type:GA combined with regional for post-op pain  Level of Consciousness: awake and alert   Airway and Oxygen Therapy: Patient Spontanous Breathing  Post-op Pain: none  Post-op Assessment: Post-op Vital signs reviewed  Post-op Vital Signs: stable  Last Vitals:  Filed Vitals:   04/19/14 1100  BP:   Pulse: 65  Temp:   Resp: 20    Complications: No apparent anesthesia complications

## 2014-04-19 NOTE — Anesthesia Procedure Notes (Addendum)
Anesthesia Regional Block:  Interscalene brachial plexus block  Pre-Anesthetic Checklist: ,, timeout performed, Correct Patient, Correct Site, Correct Laterality, Correct Procedure, Correct Position, site marked, Risks and benefits discussed,  Surgical consent,  Pre-op evaluation,  At surgeon's request and post-op pain management  Laterality: Upper and Left  Prep: chloraprep and alcohol swabs       Needles:  Injection technique: Single-shot  Needle Type: Stimulator Needle - 40     Needle Length: 9cm 9 cm Needle Gauge: 22 and 22 G  Needle insertion depth: 3 cm   Additional Needles:  Procedures: ultrasound guided (picture in chart) and nerve stimulator Interscalene brachial plexus block  Nerve Stimulator or Paresthesia:  Response: Twitch elicited, 0.5 mA, 0.3 ms,   Additional Responses:   Narrative:  Start time: 04/19/2014 7:00 AM End time: 04/19/2014 7:15 AM Injection made incrementally with aspirations every 5 mL.  Performed by: Personally  Anesthesiologist: MASSAGEE, TERRY  Additional Notes: Block assessed prior to start of surgery   Procedure Name: Intubation Date/Time: 04/19/2014 7:37 AM Performed by: Rush Farmer E Pre-anesthesia Checklist: Patient identified, Emergency Drugs available, Suction available, Patient being monitored and Timeout performed Patient Re-evaluated:Patient Re-evaluated prior to inductionOxygen Delivery Method: Circle system utilized Preoxygenation: Pre-oxygenation with 100% oxygen Intubation Type: IV induction Ventilation: Mask ventilation without difficulty Laryngoscope Size: Mac and 3 Grade View: Grade I Tube type: Oral Tube size: 7.0 mm Number of attempts: 1 Airway Equipment and Method: Stylet Placement Confirmation: ETT inserted through vocal cords under direct vision,  positive ETCO2 and breath sounds checked- equal and bilateral Secured at: 20 cm Dental Injury: Teeth and Oropharynx as per pre-operative assessment

## 2014-04-19 NOTE — Progress Notes (Signed)
Utilization review completed.  

## 2014-04-19 NOTE — Op Note (Signed)
04/19/2014  10:11 AM  PATIENT:   Kristin Estrada  74 y.o. female  PRE-OPERATIVE DIAGNOSIS:  AVASCULAR NECROSIS LEFT humeral head   POST-OPERATIVE DIAGNOSIS:  same  PROCEDURE:  Left shoulder reverse arthroplasty cemented #10 stem, +6 poly, 38 eccentric glenosphere  SURGEON:  Shaina Gullatt, Metta Clines M.D.  ASSISTANTS: Shuford pac   ANESTHESIA:   GET + ISB  EBL: 250  SPECIMEN:  none  Drains: none   PATIENT DISPOSITION:  PACU - hemodynamically stable.    PLAN OF CARE: Admit to inpatient   Dictation# [id number]

## 2014-04-19 NOTE — Plan of Care (Signed)
Problem: Consults Goal: Diagnosis - Shoulder Surgery Reverse Total Shoulder Arthroplasty Left

## 2014-04-19 NOTE — H&P (Signed)
Seth Bake    Chief Complaint: AVASCULAR NECROSIS LEFT SHOULDER  HPI: The patient is a 74 y.o. 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 Chronic airway obstruction, not elsewhere classified     Symbicort daily and Albuterol daily as needed  . Seasonal allergies     takes Zyrtec daily as well as using Nasonex  . Insomnia     takes Ambien and Trazodone nightly  . Unspecified essential hypertension     takes Amlodipine and Losartan daily  . Hyperlipidemia     takes CO Q10 daily  . Chronic bronchitis   . Pneumonia     hx of x 3 with most recent one 2009  . URI (upper respiratory infection)     was on Prednisone and Antibiotic-to complete today  . Headache     occasionally  . Arthritis   . Joint pain   . Joint swelling   . Chronic back pain     arthritis  . Esophageal reflux     takes Nexium daily and Dexilant daily as needed  . Urinary urgency   . Diabetes mellitus without complication     borderline-diet and exercise;takes Cinnamon daily  . Bruises easily   . Cataracts, bilateral   . Macular degeneration, right eye     wet and to get injection tomorrow    Past Surgical History  Procedure Laterality Date  . Breast surgery  1972    nodules removed and benign    Family History  Problem Relation Age of Onset  . Heart disease      Social History:  reports that she has quit smoking. Her smoking use included Cigarettes. She has a 60 pack-year smoking history. She has never used smokeless tobacco. She reports that she does not drink alcohol or use illicit drugs.  Allergies:  Allergies  Allergen Reactions  . Protonix [Pantoprazole Sodium]     abd cramps  . Prednisone     REACTION: agitation, insomina, mood change Tolerates injection- not PO    Medications Prior to Admission  Medication Sig Dispense Refill  . albuterol (PROAIR HFA) 108 (90 BASE) MCG/ACT inhaler Inhale 2 puffs into the lungs every 4 (four)  hours as needed. 1 Inhaler 3  . albuterol (PROVENTIL) (2.5 MG/3ML) 0.083% nebulizer solution Take 2.5 mg by nebulization as needed. Take 2.5 mg by nebulization 2 (two) times daily. And as needed    . amLODipine (NORVASC) 10 MG tablet Take 10 mg by mouth daily.    Marland Kitchen amoxicillin-clavulanate (AUGMENTIN) 875-125 MG per tablet Take 1 tablet by mouth 2 (two) times daily. 14 tablet 0  . Bevacizumab (AVASTIN IV) Inject into the vein. 1.25 mg injection into the right eye every  6wks    . budesonide-formoterol (SYMBICORT) 160-4.5 MCG/ACT inhaler Inhale 2 puffs into the lungs 2 (two) times daily. 1 Inhaler 11  . cetirizine (ZYRTEC) 10 MG tablet Take 10 mg by mouth at bedtime.     . chlorpheniramine-HYDROcodone (TUSSIONEX PENNKINETIC ER) 10-8 MG/5ML LQCR Take 5 mLs by mouth every 12 (twelve) hours as needed. 140 mL 0  . cholecalciferol (VITAMIN D) 1000 UNITS tablet Take 1,000 Units by mouth daily.    . Cinnamon 500 MG capsule Take 500 mg by mouth daily.    . Coenzyme Q10 (CO Q 10) 10 MG CAPS Take 1 capsule by mouth daily.      Marland Kitchen dexlansoprazole (DEXILANT) 60 MG capsule Take 1 capsule (  60 mg total) by mouth daily. 30 capsule 6  . dextromethorphan-guaiFENesin (MUCINEX DM) 30-600 MG per 12 hr tablet Take 1 tablet by mouth every 12 (twelve) hours as needed.     . diclofenac (VOLTAREN) 75 MG EC tablet Take 75 mg by mouth 2 (two) times daily.      Marland Kitchen esomeprazole (NEXIUM) 40 MG capsule Take 1 capsule (40 mg total) by mouth daily at 12 noon. 30 capsule 5  . Homeopathic Products ([employer] LEG CRAMPS PAIN RELIEF PO) Per bottle as needed    . ipratropium (ATROVENT) 0.02 % nebulizer solution Take 500 mcg by nebulization as needed. And as needed    . losartan (COZAAR) 25 MG tablet Take 25 mg by mouth daily.    . Magnesium 400 MG CAPS Take 4 capsules by mouth daily.     . mometasone (NASONEX) 50 MCG/ACT nasal spray Place 2 sprays into the nose daily. 17 g 6  . nystatin (MYCOSTATIN) 100000 UNIT/ML suspension Use 5 ml (cc) three  times a day as needed. 240 mL 1  . traMADol (ULTRAM) 50 MG tablet Take 50 mg by mouth every 6 (six) hours as needed.      . traZODone (DESYREL) 50 MG tablet Take 25 mg by mouth at bedtime.     . vitamin C (ASCORBIC ACID) 500 MG tablet Take 500 mg by mouth daily.    Marland Kitchen zolpidem (AMBIEN) 10 MG tablet Take 10 mg by mouth at bedtime.     . famotidine (PEPCID) 20 MG tablet Take 1 tablet (20 mg total) by mouth 2 (two) times daily. When coughing (Patient not taking: Reported on 04/09/2014) 60 tablet 6     Physical Exam: left shoulder with severely restricted motion as noted at recent office visits  Vitals  Temp:  [98.1 F (36.7 C)] 98.1 F (36.7 C) (03/03 0601) Pulse Rate:  [81] 81 (03/03 0601) Resp:  [18] 18 (03/03 0601) BP: (169)/(60) 169/60 mmHg (03/03 0601) SpO2:  [94 %] 94 % (03/03 0601)  Assessment/Plan  Impression: AVASCULAR NECROSIS LEFT SHOULDER   Plan of Action: Procedure(s): LEFT TOTAL SHOULDER ARTHROPLASTY VERSE  POSSIBLE REVERSE TOTAL SHOULDER ARTHROPLASTY  Edell Mesenbrink M 04/19/2014, 7:21 AM

## 2014-04-19 NOTE — Transfer of Care (Signed)
Immediate Anesthesia Transfer of Care Note  Patient: Kristin Estrada  Procedure(s) Performed: Procedure(s):  REVERSE TOTAL SHOULDER ARTHROPLASTY (Left)  Patient Location: PACU  Anesthesia Type:General  Level of Consciousness: awake, alert  and oriented  Airway & Oxygen Therapy: Patient Spontanous Breathing and Patient connected to face mask oxygen  Post-op Assessment: Report given to RN and Post -op Vital signs reviewed and stable  Post vital signs: Reviewed and stable  Last Vitals:  Filed Vitals:   04/19/14 0601  BP: 169/60  Pulse: 81  Temp: 36.7 C  Resp: 18    Complications: No apparent anesthesia complications

## 2014-04-19 NOTE — Anesthesia Preprocedure Evaluation (Addendum)
Anesthesia Evaluation  Patient identified by MRN, date of birth, ID band Patient awake    Reviewed: Allergy & Precautions, NPO status , Patient's Chart, lab work & pertinent test results, reviewed documented beta blocker date and time   Airway Mallampati: I       Dental  (+) Edentulous Upper, Edentulous Lower   Pulmonary COPDformer smoker,  breath sounds clear to auscultation        Cardiovascular hypertension, Rhythm:Regular Rate:Normal     Neuro/Psych  Headaches,    GI/Hepatic GERD-  Medicated,  Endo/Other  diabetes  Renal/GU      Musculoskeletal  (+) Arthritis -,   Abdominal   Peds  Hematology   Anesthesia Other Findings   Reproductive/Obstetrics                            Anesthesia Physical Anesthesia Plan  ASA: III  Anesthesia Plan: General   Post-op Pain Management:    Induction: Intravenous  Airway Management Planned:   Additional Equipment:   Intra-op Plan:   Post-operative Plan: Extubation in OR  Informed Consent: I have reviewed the patients History and Physical, chart, labs and discussed the procedure including the risks, benefits and alternatives for the proposed anesthesia with the patient or authorized representative who has indicated his/her understanding and acceptance.     Plan Discussed with: Surgeon and CRNA  Anesthesia Plan Comments:         Anesthesia Quick Evaluation

## 2014-04-20 ENCOUNTER — Encounter (HOSPITAL_COMMUNITY): Payer: Self-pay | Admitting: Orthopedic Surgery

## 2014-04-20 MED ORDER — DIAZEPAM 5 MG PO TABS: 2.5000 mg | ORAL_TABLET | Freq: Four times a day (QID) | ORAL | Status: AC | PRN

## 2014-04-20 MED ORDER — OXYCODONE-ACETAMINOPHEN 5-325 MG PO TABS: 1.0000 | ORAL_TABLET | ORAL | Status: DC | PRN

## 2014-04-20 NOTE — Care Management Note (Signed)
CARE MANAGEMENT NOTE 04/20/2014  Patient:  Kristin Estrada, Kristin Estrada   Account Number:  [id number]  Date Initiated:  04/20/2014  Documentation initiated by:  Ricki Miller  Subjective/Objective Assessment:   74 yr old female admitted with avascular necrosis of left shoulder. Patient underwent left shoulder reverse total arthroplasty.     Action/Plan:   Patient lives home with husband, has family support at discharge. No home health needs identified by PT/OT.   Anticipated DC Date:  04/20/2014   Anticipated DC Plan:  [place]  CM consult      PAC Choice  NA   Choice offered to / List presented to:     DME arranged  NA        HH arranged  NA      Status of service:  Completed, signed off Medicare Important Message given?  NA - LOS <3 / Initial given by admissions (If response is "NO", the following Medicare IM given date fields will be blank) Date Medicare IM given:   Medicare IM given by:   Date Additional Medicare IM given:   Additional Medicare IM given by:    Discharge Disposition:  HOME/SELF CARE  Per UR Regulation:  Reviewed for med. necessity/level of care/duration of stay  If discussed at [place] of Stay Meetings, dates discussed:    Comments:

## 2014-04-20 NOTE — Op Note (Signed)
NAMEMarland Kitchen  TERRIS, BODIN NO.:  [id number]  MEDICAL RECORD NO.:  [id number]  LOCATION:  5N29C                        FACILITY:  [facility]  PHYSICIAN:  Metta Clines. Lalo Tromp, M.D.  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  PROCEDURE:  Left shoulder reverse arthroplasty utilizing a cemented size 10 DePuy stem, a +6 polyethylene insert, and a 38 eccentric glenosphere.  SURGEON:  Metta Clines. Lajuane Leatham, M.D.  Terrence DupontOlivia Mackie A. Shuford, PA-C  ANESTHESIA:  General endotracheal as well as an interscalene block.  ESTIMATED BLOOD LOSS:  250 mL.  DRAINS:  None.  HISTORY:  Ms. Noblett is a 74 year old female who had a left proximal humerus fracture a number of years ago and went onto avascular necrosis with collapse of the humeral head, now has quite severe deformity of the humeral head and essentially an arthrosis of the shoulder with complete loss of glenohumeral mobility.  Due to her ongoing severe pain, restricted mobility and functional limitation, she was brought to the operating room at this time for planned left shoulder reverse arthroplasty.  Preoperatively, I counseled Ms. Barbara-Cook regarding treatment options and risks versus benefits thereof.  Possible surgical complications were all reviewed including potential for bleeding, infection, neurovascular injury, persistent pain, loss of motion, anesthetic complication, possible need for additional surgery.  She understands and accepts and agrees with our planned procedure.  DESCRIPTION OF PROCEDURE:  After undergoing  routine preop evaluation, the patient received prophylactic antibiotics and an interscalene block was established in the holding area by the Anesthesia Department. Placed supine on the operating table, underwent smooth induction of general endotracheal anesthesia.  Placed in a beach-chair position and appropriately padded and protected.  The left shoulder girdle region was sterilely prepped and draped in standard fashion.  Time-out was called. An anterior deltopectoral approach was made in the left shoulder through a 10 cm incision.  Skin flaps were elevated and electrocautery used for hemostasis.  Cephalic vein was identified and retracted lateral to deltoid.  Pectoralis major retracted medially.  Upper centimeter of pectoralis major was tenotomized to enhance exposure.  Conjoint tendon identified, mobilized, retracted medially.  Adhesions divided beneath the deltoid.  Self-retaining retractors were placed.  There was significant deformity of the humeral head and we went ahead and divided the biceps tendon for later tenodesis and then split the rotator cuff along the rotator interval to the base of the coracoid and divided the subscapularis away from the lesser tuberosity and again there was severe bony deformity.  We released the capsule tissues from the anterior, anteroinferior, and inferior aspect of the humeral head and ultimately, we were able to deliver the head through the wound.  Remnants of the rotator cuff were markedly atrophic, thinned, and went ahead and divided the superior attachments of the cuff which appeared quite nonfunctional. Ultimately, delivered the head.  This demonstrated  severe collapse of the center of the humeral head and essentially the morphology had changed related to the collapse, such that the glenoid was almost convex and the humeral head became concave.  At this point, a rongeur was used to gain access to the cancellous bone of the apex of the humeral  head and performed hand reaming of the humeral canal up to size 10. Intramedullary guide was placed and then made a provisional humeral head resection, 0 degrees of retroversion with an oscillating saw.  Once this was completed, I then performed capsular releases and then exposed the glenoid with combination of Fukuda, pitchfork, and snake tongue retractors.  There was marked scarring around the periphery of the glenoid.  Using electrocautery, I was able to perform a labral resection, gaining clear access to the entire periphery of the glenoid and removing the proximal stump of the biceps and associated labral tissue.  Once we had complete visualization of the glenoid, a guide pin was placed into the center of the glenoid with a slight inferior tilt. Glenoid was then reamed and peripheral debris was then removed.  Central hole was then drilled.  The glenoid base plate was then impacted into position and then transfixed with a series of size 18 fixation screws. None of the screw depths were beyond 18 and so we were not able to use any locking screws, but all of these nonlocking screws which we used x4 had good purchase.  We then placed a 38 eccentric glenosphere over the base plate and this was sequentially tightened, impacted, and retightened.  We then returned our attention to the proximal humerus and hoped to initially use a press-fit stem, we used the size 1 eccentric reaming guide and as we were reaming, the metaphyseal portion of the humerus became clear that there was some malunion of the previous humeral head fracture with a loss of integrity of the majority of the metaphyseal region and has became clear that the press-fit of the metaphysis was not possible.  We converted to the use of a cemented stem.  We then placed our reaming guide and reamed with a size 1 cemented stem and based upon this, performed a trial reduction which showed appropriate soft tissue balance.  The humeral  canal was then irrigated.  Distal cement plug was placed.  The canal was cleaned and dried.  Cement was mixed and at the appropriate consistency, we introduced cement into the humeral canal.  Size 10 stem was then inserted at neutral retroversion.  All extra cement was removed.  We then performed a series of trial reductions and ultimately +6 poly showed the best soft tissue balance.  Our final +6 poly was then impacted.  Final reduction was performed.  Good soft tissue balance, good stability of the shoulder, and good motion was achieved.  The subscapularis was mobilized, although showed somewhat limited quality tissue and had obviously severe disuse atrophy, but this certainly was mobilized, free margin was tagged and then we repaired this back to the metaphyseal region of the humerus with FiberWires that were placed through the islets on the humeral stem.  The joint was then copiously irrigated once again, hemostasis was obtained.  The biceps tendon was tenodesed at the level of the upper pectoralis major with a #1 Vicryl. The deltopectoral interval was then reapproximated with series of #1 Vicryl figure-of-eight sutures, 2-0 Vicryl subcu layer and intracuticular 3-0 Monocryl for the skin followed by Dermabond and dry dressing.  Right arm was placed in  a sling.  The patient was awakened, extubated, and taken to the recovery room in stable condition.  Tracy A. Shuford, PA-C, was used as an Environmental consultant throughout this case, essential for help with positioning the patient, positioning the extremity, retraction, implantation of the prosthesis, wound closure, and intraoperative decision making.     Metta Clines. Mirabelle Cyphers, M.D.     KMS/MEDQ  D:  04/19/2014  T:  04/20/2014  Job:  [id number]

## 2014-04-20 NOTE — Evaluation (Signed)
Occupational Therapy Evaluation Patient Details Name: Kristin Estrada MRN: [id number] DOB: Feb 04, 1941 Today's Date: 04/20/2014    History of Present Illness Pt is a 74 y.o. Female s/p Left Reverse TSA on 04/19/14. PMH: HTN, HLD, macular degenerations, DM, cataracts.    Clinical Impression   PTA pt lived at home with her husband and was independent with ADLs. Pt currently limited by pain, decreased ROM, and shoulder precautions which impair her independence with UB ADLs. Pt requires min guard for functional mobility and is mildly unsteady during ambulation. Pt will benefit from acute OT to address therapeutic exercises of LUE and independence with ADLs. Recommend PT evaluation to assess mobility.     Follow Up Recommendations  No OT follow up;Supervision/Assistance - 24 hour (HHPT for exercises)    Equipment Recommendations  None recommended by OT    Recommendations for Other Services PT consult     Precautions / Restrictions Precautions Precautions: Shoulder Type of Shoulder Precautions: No AROM; Supple Reverse Protocol: AAROM shoulder FF 0-90, ABD 0-60, ER 0-30, pendulums, AROM of elbow, wrist, hand Shoulder Interventions: Shoulder sling/immobilizer;At all times;Off for dressing/bathing/exercises Precaution Booklet Issued: Yes (comment) Precaution Comments: Educated pt on incorporating shoulder precautions into ADLs.  Required Braces or Orthoses: Sling Restrictions Weight Bearing Restrictions: Yes LUE Weight Bearing: Non weight bearing      Mobility Bed Mobility               General bed mobility comments: Pt up in bathroom when OT arrived.   Transfers Overall transfer level: Needs assistance Equipment used: None Transfers: Sit to/from Stand Sit to Stand: Min guard         General transfer comment: Min guard for safety and balance. Mildly unsteady. Use of IV pole for ambulation. No physical assist needed.          ADL Overall ADL's : Needs  assistance/impaired Eating/Feeding: Set up;Sitting Eating/Feeding Details (indicate cue type and reason): assist to open containers and cut food Grooming: Set up;Standing;Wash/dry hands   Upper Body Bathing: Minimal assitance;Sitting   Lower Body Bathing: Min guard;Sit to/from stand   Upper Body Dressing : Maximal assistance;Sitting Upper Body Dressing Details (indicate cue type and reason): including sling Lower Body Dressing: Min guard;Sit to/from stand   Toilet Transfer: Min guard;Ambulation (IV pole)   Toileting- Clothing Manipulation and Hygiene: Min guard;Sit to/from stand       Functional mobility during ADLs: Min guard General ADL Comments: Pt familiar with compensatory techniques from previous LUE injury (received therapy at that time, also used immobilizer). Briefly reviewed compensatory techniques for UB ADLs. Pt participated in UE exercises.      Vision  Pt has hx of cataracts and macular degeneration. She reports no change from baseline.           Pertinent Vitals/Pain Pain Assessment: 0-10 Pain Score: 4  Pain Location: Left shoulder Pain Descriptors / Indicators: Aching Pain Intervention(s): Limited activity within patient's tolerance;Monitored during session;Premedicated before session;Repositioned;Ice applied     Hand Dominance Left   Extremity/Trunk Assessment Upper Extremity Assessment Upper Extremity Assessment: LUE deficits/detail LUE Deficits / Details: L reverse TSA; Supple Protocol (see above) LUE: Unable to fully assess due to pain;Unable to fully assess due to immobilization LUE Coordination: decreased fine motor;decreased gross motor   Lower Extremity Assessment Lower Extremity Assessment: Defer to PT evaluation   Cervical / Trunk Assessment Cervical / Trunk Assessment: Normal   Communication Communication Communication: No difficulties   Cognition Arousal/Alertness: Awake/alert Behavior During Therapy: [facility] for  tasks  assessed/performed Overall Cognitive Status: Within Functional Limits for tasks assessed                        Exercises Exercises: Shoulder     May 05, 2014 0900  Shoulder Instructions  Donning/doffing shirt without moving shoulder Moderate assistance;Patient able to independently direct caregiver  Method for sponge bathing under operated UE Minimal assistance;Patient able to independently direct caregiver  Donning/doffing sling/immobilizer Maximal assistance;Patient able to independently direct caregiver  Correct positioning of sling/immobilizer Minimal assistance;Patient able to independently direct caregiver  Pendulum exercises (written home exercise program) (instructed; not completed at this time)  ROM for elbow, wrist and digits of operated UE Independent  Sling wearing schedule (on at all times/off for ADL's) Independent  Proper positioning of operated UE when showering Independent  Positioning of UE while sleeping Independent      Shoulder Instructions Shoulder Instructions Donning/doffing shirt without moving shoulder: Moderate assistance;Patient able to independently direct caregiver Method for sponge bathing under operated UE: Minimal assistance;Patient able to independently direct caregiver Donning/doffing sling/immobilizer: Maximal assistance;Patient able to independently direct caregiver Correct positioning of sling/immobilizer: Minimal assistance;Patient able to independently direct caregiver Pendulum exercises (written home exercise program):  (instructed; not completed at this time) ROM for elbow, wrist and digits of operated UE: Independent Sling wearing schedule (on at all times/off for ADL's): Independent Proper positioning of operated UE when showering: Independent Positioning of UE while sleeping: [place] expects to be discharged to:: Private residence Living Arrangements: Spouse/significant other;Children (son can  assist) Available Help at Discharge: Family;Available 24 hours/day Type of Home: House Home Access: Stairs to enter [employer] of Steps: 3 Entrance Stairs-Rails: Right;Left Home Layout: One level     Bathroom Shower/Tub: Occupational psychologist: Standard     Home Equipment: Shower seat          Prior Functioning/Environment Level of Independence: Independent             OT Diagnosis: Generalized weakness;Acute pain   OT Problem List: Decreased strength;Decreased range of motion;Decreased activity tolerance;Impaired balance (sitting and/or standing);Decreased knowledge of use of DME or AE;Decreased knowledge of precautions;Impaired UE functional use;Pain   OT Treatment/Interventions: Self-care/ADL training;Therapeutic exercise;Energy conservation;DME and/or AE instruction;Therapeutic activities;Patient/family education;Balance training    OT Goals(Current goals can be found in the care plan section) Acute Rehab OT Goals Patient Stated Goal: to go home OT Goal Formulation: With patient Time For Goal Achievement: 05/04/14 Potential to Achieve Goals: Good ADL Goals Pt Will Perform Grooming: with supervision;standing Pt Will Perform Upper Body Dressing: with min assist;sitting Pt Will Transfer to Toilet: with supervision;ambulating Pt/caregiver will Perform Home Exercise Program: Increased ROM;Left upper extremity;With Supervision;With written HEP provided  OT Frequency: Min 2X/week    End of Session Equipment Utilized During Treatment: Other (comment) (sling; long handled shoe horn (for exercises)) Nurse Communication: Other (comment) (IV site leaking)  Activity Tolerance: Patient tolerated treatment well Patient left: in chair;with call bell/phone within reach   Time: 0830-0908 OT Time Calculation (min): 38 min Charges:  OT General Charges $OT Visit: 1 Procedure OT Evaluation $Initial OT Evaluation Tier I: 1 Procedure OT Treatments $Self  Care/Home Management : 8-22 mins $Therapeutic Exercise: 8-22 mins G-Codes:    Juluis Rainier 05-05-2014, 9:31 AM   Cyndie Chime, OTR/L Occupational Therapist [phone] (pager)

## 2014-04-20 NOTE — Discharge Summary (Signed)
PATIENT ID:      Kristin Estrada  MRN:     [id number] DOB/AGE:    [birth year] / 74 y.o.     DISCHARGE SUMMARY  ADMISSION DATE:    04/19/2014 DISCHARGE DATE:    ADMISSION DIAGNOSIS: AVASCULAR NECROSIS LEFT SHOULDER  Past Medical History  Diagnosis Date  . Chronic airway obstruction, not elsewhere classified     Symbicort daily and Albuterol daily as needed  . Seasonal allergies     takes Zyrtec daily as well as using Nasonex  . Insomnia     takes Ambien and Trazodone nightly  . Unspecified essential hypertension     takes Amlodipine and Losartan daily  . Hyperlipidemia     takes CO Q10 daily  . Chronic bronchitis   . Pneumonia     hx of x 3 with most recent one 2009  . URI (upper respiratory infection)     was on Prednisone and Antibiotic-to complete today  . Headache     occasionally  . Arthritis   . Joint pain   . Joint swelling   . Chronic back pain     arthritis  . Esophageal reflux     takes Nexium daily and Dexilant daily as needed  . Urinary urgency   . Diabetes mellitus without complication     borderline-diet and exercise;takes Cinnamon daily  . Bruises easily   . Cataracts, bilateral   . Macular degeneration, right eye     wet and to get injection tomorrow    DISCHARGE DIAGNOSIS:   Active Problems:   S/P shoulder replacement   PROCEDURE: Procedure(s):  REVERSE TOTAL SHOULDER ARTHROPLASTY on 04/19/2014  CONSULTS:     HISTORY:  See H&P in chart.  HOSPITAL COURSE:  Kristin Estrada is a 74 y.o. admitted on 04/19/2014 with a chief complaint of left shoulder pain and dysfunction with history of shoulder fracture in 2011, and found to have a diagnosis of [place] .  They were brought to the operating room on 04/19/2014 and underwent Procedure(s):  REVERSE TOTAL SHOULDER ARTHROPLASTY.    They were given perioperative antibiotics: Anti-infectives    Start     Dose/Rate Route Frequency Ordered Stop   04/19/14 1615  ceFAZolin (ANCEF) IVPB  1 g/50 mL premix     1 g 100 mL/hr over 30 Minutes Intravenous Every 6 hours 04/19/14 1527 04/20/14 0526   04/19/14 0600  ceFAZolin (ANCEF) IVPB 2 g/50 mL premix     2 g 100 mL/hr over 30 Minutes Intravenous On call to O.R. 04/18/14 1331 04/19/14 0819    .  Patient underwent the above named procedure and tolerated it well. The following day they were hemodynamically stable and pain was controlled on oral analgesics. They were neurovascularly intact to the operative extremity. OT was ordered and worked with patient per protocol. They were medically and orthopaedically stable for discharge on day 1. Home health was arranged    DIAGNOSTIC STUDIES:  RECENT RADIOGRAPHIC STUDIES :  Dg Chest 2 View  04/10/2014   CLINICAL DATA:  60 pack-year history smoker, preop evaluation for left total shoulder arthroplasty.  EXAM: CHEST  2 VIEW  COMPARISON:  08/04/2013  FINDINGS: Mild hyperinflation and slight vascular/interstitial prominence as before. Normal heart size. No focal pneumonia, collapse or consolidation. Negative for edema, effusion or pneumothorax. Degenerative changes of the spine. Atherosclerosis noted of the aorta. Soft tissue calcifications in the right breast project over the right lower lobe.  IMPRESSION: Stable chest  exam. No interval change or superimposed acute process.   Electronically Signed   By: Jerilynn Mages.  Shick M.D.   On: 04/10/2014 22:53    RECENT VITAL SIGNS:  Patient Vitals for the past 24 hrs:  BP Temp Pulse Resp SpO2  04/20/14 0603 (!) 131/48 mmHg 98.3 F (36.8 C) 96 18 90 %  04/20/14 0019 (!) 138/52 mmHg 99 F (37.2 C) 82 18 94 %  04/19/14 2107 (!) 149/55 mmHg 99.3 F (37.4 C) 76 18 92 %  04/19/14 1520 (!) 138/48 mmHg 98.7 F (37.1 C) 89 20 92 %  04/19/14 1500 - - 91 (!) 21 96 %  04/19/14 1445 - 97.9 F (36.6 C) 79 (!) 32 97 %  04/19/14 1436 100/60 mmHg - 86 15 95 %  04/19/14 1430 - - 75 (!) 22 96 %  04/19/14 1421 123/82 mmHg - 78 18 96 %  04/19/14 1415 - - 78 20 96 %   04/19/14 1406 (!) 126/51 mmHg - 69 (!) 21 95 %  04/19/14 1400 - - 76 (!) 22 96 %  04/19/14 1351 108/67 mmHg - 75 (!) 22 96 %  04/19/14 1345 - - 79 (!) 22 96 %  04/19/14 1336 (!) 138/54 mmHg - 78 (!) 22 96 %  04/19/14 1330 - 97.9 F (36.6 C) 78 20 96 %  04/19/14 1321 137/62 mmHg - 85 18 96 %  04/19/14 1315 - - 78 (!) 22 96 %  04/19/14 1307 118/64 mmHg - 85 19 96 %  04/19/14 1300 - - 79 16 96 %  04/19/14 1251 133/67 mmHg - 82 16 96 %  04/19/14 1245 - - 78 19 97 %  04/19/14 1236 (!) 134/56 mmHg - 88 14 96 %  04/19/14 1230 - - 85 19 96 %  04/19/14 1221 (!) 130/49 mmHg - 76 20 96 %  04/19/14 1215 - - 73 (!) 22 96 %  04/19/14 1206 (!) 126/45 mmHg - 71 15 97 %  04/19/14 1200 (!) 126/45 mmHg - 72 18 96 %  04/19/14 1145 - - 73 (!) 23 97 %  04/19/14 1136 (!) 126/46 mmHg - 76 15 97 %  04/19/14 1130 - - 79 14 96 %  04/19/14 1121 128/70 mmHg - 65 19 96 %  04/19/14 1115 - - 67 17 98 %  04/19/14 1107 (!) 119/49 mmHg - 66 (!) 23 97 %  04/19/14 1100 - - 65 20 96 %  04/19/14 1057 - - 70 19 97 %  04/19/14 1053 (!) 115/46 mmHg - 66 (!) 22 96 %  04/19/14 1045 - - 67 (!) 21 95 %  04/19/14 1030 - - 63 (!) 21 93 %  04/19/14 1015 (!) 110/45 mmHg - 71 (!) 23 99 %  04/19/14 1000 (!) 131/53 mmHg 97.8 F (36.6 C) 77 18 100 %  .  RECENT EKG RESULTS:    Orders placed or performed during the hospital encounter of 04/10/14  . EKG 12-Lead  . EKG 12-Lead    DISCHARGE INSTRUCTIONS:    DISCHARGE MEDICATIONS:     Medication List    TAKE these medications        albuterol (2.5 MG/3ML) 0.083% nebulizer solution  Commonly known as:  PROVENTIL  Take 2.5 mg by nebulization as needed. Take 2.5 mg by nebulization 2 (two) times daily. And as needed     albuterol 108 (90 BASE) MCG/ACT inhaler  Commonly known as:  PROAIR HFA  Inhale 2 puffs into the lungs  every 4 (four) hours as needed.     amLODipine 10 MG tablet  Commonly known as:  NORVASC  Take 10 mg by mouth daily.     amoxicillin-clavulanate  875-125 MG per tablet  Commonly known as:  AUGMENTIN  Take 1 tablet by mouth 2 (two) times daily.     AVASTIN IV  Inject into the vein. 1.25 mg injection into the right eye every  6wks     budesonide-formoterol 160-4.5 MCG/ACT inhaler  Commonly known as:  SYMBICORT  Inhale 2 puffs into the lungs 2 (two) times daily.     cetirizine 10 MG tablet  Commonly known as:  ZYRTEC  Take 10 mg by mouth at bedtime.     chlorpheniramine-HYDROcodone 10-8 MG/5ML Lqcr  Commonly known as:  TUSSIONEX PENNKINETIC ER  Take 5 mLs by mouth every 12 (twelve) hours as needed.     cholecalciferol 1000 UNITS tablet  Commonly known as:  VITAMIN D  Take 1,000 Units by mouth daily.     Cinnamon 500 MG capsule  Take 500 mg by mouth daily.     Co Q 10 10 MG Caps  Take 1 capsule by mouth daily.     [employer] LEG CRAMPS PAIN RELIEF PO  Per bottle as needed     dextromethorphan-guaiFENesin 30-600 MG per 12 hr tablet  Commonly known as:  MUCINEX DM  Take 1 tablet by mouth every 12 (twelve) hours as needed.     diazepam 5 MG tablet  Commonly known as:  VALIUM  Take 0.5-1 tablets (2.5-5 mg total) by mouth every 6 (six) hours as needed for muscle spasms or sedation.     diclofenac 75 MG EC tablet  Commonly known as:  VOLTAREN  Take 75 mg by mouth 2 (two) times daily.     famotidine 20 MG tablet  Commonly known as:  PEPCID  Take 1 tablet (20 mg total) by mouth 2 (two) times daily. When coughing     ipratropium 0.02 % nebulizer solution  Commonly known as:  ATROVENT  Take 500 mcg by nebulization as needed. And as needed     losartan 25 MG tablet  Commonly known as:  COZAAR  Take 25 mg by mouth daily.     Magnesium 400 MG Caps  Take 4 capsules by mouth daily.     oxyCODONE-acetaminophen 5-325 MG per tablet  Commonly known as:  PERCOCET  Take 1-2 tablets by mouth every 4 (four) hours as needed.     traMADol 50 MG tablet  Commonly known as:  ULTRAM  Take 50 mg by mouth every 6 (six) hours as needed.      traZODone 50 MG tablet  Commonly known as:  DESYREL  Take 25 mg by mouth at bedtime.     vitamin C 500 MG tablet  Commonly known as:  ASCORBIC ACID  Take 500 mg by mouth daily.     zolpidem 10 MG tablet  Commonly known as:  AMBIEN  Take 10 mg by mouth at bedtime.        FOLLOW UP VISIT:       Follow-up Information    Follow up with Marin Shutter, MD.   Specialty:  Orthopedic Surgery   Why:  call to be seen in 10-14 days   Contact information:   [address] [place] [address] [place] [address] [phone]       DISCHARGE TO: Home  DISPOSITION: Good  DISCHARGE CONDITION:  Festus Barren for Dr.  Justice Britain 04/20/2014, 7:57 AM

## 2014-10-15 ENCOUNTER — Ambulatory Visit (INDEPENDENT_AMBULATORY_CARE_PROVIDER_SITE_OTHER): Payer: Medicare HMO | Admitting: Critical Care Medicine

## 2014-10-15 ENCOUNTER — Encounter: Payer: Self-pay | Admitting: Critical Care Medicine

## 2014-10-15 VITALS — BP 140/72 | HR 65 | Temp 98.3°F | Ht 64.0 in | Wt 162.8 lb

## 2014-10-15 DIAGNOSIS — J449 Chronic obstructive pulmonary disease, unspecified: Secondary | ICD-10-CM | POA: Diagnosis not present

## 2014-10-15 MED ORDER — MOMETASONE FUROATE 50 MCG/ACT NA SUSP: 1.0000 | Freq: Every day | NASAL | Status: DC

## 2014-10-15 MED ORDER — BUDESONIDE-FORMOTEROL FUMARATE 160-4.5 MCG/ACT IN AERO: 2.0000 | INHALATION_SPRAY | Freq: Two times a day (BID) | RESPIRATORY_TRACT | Status: DC

## 2014-10-15 NOTE — Progress Notes (Signed)
Subjective:    Patient ID: Kristin Estrada, female    DOB: 20-Apr-1940, 74 y.o.   MRN: [id number]  HPI 10/15/2014 Chief Complaint  Patient presents with  . Follow-up    Breathing doing well overall.  DOE unchanged.  Occas nonprod cough and occas wheeze.  No chest tightness or CP.  resp status is stable     No new issues Pt denies any significant sore throat, nasal congestion or excess secretions, fever, chills, sweats, unintended weight loss, pleurtic or exertional chest pain, orthopnea PND, or leg swelling Pt denies any increase in rescue therapy over baseline, denies waking up needing it or having any early am or nocturnal exacerbations of coughing/wheezing/or dyspnea. Pt also denies any obvious fluctuation in symptoms with  weather or environmental change or other alleviating or aggravating factors   Current Medications, Allergies, Complete Past Medical History, Past Surgical History, Family History, and Social History were reviewed in [facility] record per todays encounter:  10/15/2014  Review of Systems  Constitutional: Negative.   HENT: Negative.  Negative for ear pain, postnasal drip, rhinorrhea, sinus pressure, sore throat, trouble swallowing and voice change.   Eyes: Negative.   Respiratory: Positive for cough and shortness of breath. Negative for apnea, choking, chest tightness, wheezing and stridor.   Cardiovascular: Negative.  Negative for chest pain, palpitations and leg swelling.  Gastrointestinal: Negative.  Negative for nausea, vomiting, abdominal pain and abdominal distention.  Genitourinary: Negative.   Musculoskeletal: Negative.  Negative for myalgias and arthralgias.  Skin: Negative.  Negative for rash.  Allergic/Immunologic: Negative.  Negative for environmental allergies and food allergies.  Neurological: Negative.  Negative for dizziness, syncope, weakness and headaches.  Hematological: Negative.  Negative for adenopathy. Does not  bruise/bleed easily.  Psychiatric/Behavioral: Negative.  Negative for sleep disturbance and agitation. The patient is not nervous/anxious.        Objective:   Physical Exam Filed Vitals:   10/15/14 0950  BP: 140/72  Pulse: 65  Temp: 98.3 F (36.8 C)  TempSrc: Oral  Height: 5\' 4"  (1.626 m)  Weight: 162 lb 12.8 oz (73.846 kg)  SpO2: 97%    Gen: Pleasant, well-nourished, in no distress,  normal affect  ENT: No lesions,  mouth clear,  oropharynx clear, no postnasal drip  Neck: No JVD, no TMG, no carotid bruits  Lungs: No use of accessory muscles, no dullness to percussion, distant bs   Cardiovascular: RRR, heart sounds normal, no murmur or gallops, no peripheral edema  Abdomen: soft and NT, no HSM,  BS normal  Musculoskeletal: No deformities, no cyanosis or clubbing  Neuro: alert, non focal  Skin: Warm, no lesions or rashes  No results found.     Assessment & Plan:  I personally reviewed all images and lab data in the [facility] system as well as any outside material available during this office visit and agree with the  radiology impressions.   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  Diagnoses and all orders for this visit:  Obstructive chronic bronchitis without exacerbation  Other orders -     budesonide-formoterol (SYMBICORT) 160-4.5 MCG/ACT inhaler; Inhale 2 puffs into the lungs 2 (two) times daily. -     mometasone (NASONEX) 50 MCG/ACT nasal spray; Place 1 spray into the nose daily.

## 2014-10-15 NOTE — Patient Instructions (Addendum)
No change in medications. Return in         4 months  In [place] office with Dr Alva Garnet

## 2014-10-15 NOTE — Assessment & Plan Note (Signed)
Gold B Copd with recurrent exacerbations, now stable Plan No change in inhaled meds symbicort and nasonex

## 2014-10-31 ENCOUNTER — Ambulatory Visit: Payer: Medicare HMO | Admitting: Critical Care Medicine

## 2014-11-08 ENCOUNTER — Encounter: Payer: Self-pay | Admitting: Emergency Medicine

## 2014-11-08 ENCOUNTER — Ambulatory Visit (INDEPENDENT_AMBULATORY_CARE_PROVIDER_SITE_OTHER): Payer: Medicare HMO | Admitting: Emergency Medicine

## 2014-11-08 VITALS — BP 118/66 | HR 75 | Temp 97.5°F | Ht 64.0 in | Wt 163.0 lb

## 2014-11-08 DIAGNOSIS — J449 Chronic obstructive pulmonary disease, unspecified: Secondary | ICD-10-CM

## 2014-11-08 MED ORDER — DOXYCYCLINE HYCLATE 100 MG PO TABS: 100.0000 mg | ORAL_TABLET | Freq: Two times a day (BID) | ORAL | Status: DC

## 2014-11-08 MED ORDER — HYDROCOD POLST-CPM POLST ER 10-8 MG/5ML PO SUER: 5.0000 mL | Freq: Two times a day (BID) | ORAL | Status: DC | PRN

## 2014-11-08 MED ORDER — PREDNISONE 10 MG PO TABS: ORAL_TABLET | ORAL | Status: DC

## 2014-11-08 NOTE — Progress Notes (Signed)
Subjective:    Patient ID: Kristin Estrada, female    DOB: 09/01/1940, 74 y.o.   MRN: [id number]  HPI 74 year old former smoker with a history of COPD, seasonal allergies, hypertension, diabetes. She's been followed by Dr. Joya Gaskins for her allergies and gold stage B COPD. She presents today for an acute visit.  She has noticed more congestion, nasal drainage, throat irritation. She has been on nasonox and zyrtec. She started coughing yesterday, non-prductive. Has had some wheeze, no change in her dyspnea. She has not needed to increase her SABA use.   She is planning for eye sgy in October   Review of Systems As per history of present illness  Past Medical History  Diagnosis Date  . Chronic airway obstruction, not elsewhere classified     Symbicort daily and Albuterol daily as needed  . Seasonal allergies     takes Zyrtec daily as well as using Nasonex  . Insomnia     takes Ambien and Trazodone nightly  . Unspecified essential hypertension     takes Amlodipine and Losartan daily  . Hyperlipidemia     takes CO Q10 daily  . Chronic bronchitis   . Pneumonia     hx of x 3 with most recent one 2009  . URI (upper respiratory infection)     was on Prednisone and Antibiotic-to complete today  . Headache     occasionally  . Arthritis   . Joint pain   . Joint swelling   . Chronic back pain     arthritis  . Esophageal reflux     takes Nexium daily and Dexilant daily as needed  . Urinary urgency   . Diabetes mellitus without complication     borderline-diet and exercise;takes Cinnamon daily  . Bruises easily   . Cataracts, bilateral   . Macular degeneration, right eye     wet and to get injection tomorrow     Family History  Problem Relation Age of Onset  . Heart disease       Social History   Social History  . Marital Status: Married    Spouse Name: N/A  . Number of Children: N/A  . Years of Education: N/A   Occupational History  . Retired    Social History  Main Topics  . Smoking status: Former Smoker -- 2.00 packs/day for 30 years    Types: Cigarettes    Quit date: 02/17/2004  . Smokeless tobacco: Never Used     Comment: quit smoking in 2006  . Alcohol Use: No  . Drug Use: No  . Sexual Activity: Not on file   Other Topics Concern  . Not on file   Social History Narrative     Allergies  Allergen Reactions  . Protonix [Pantoprazole Sodium]     abd cramps  . Prednisone     REACTION: agitation, insomina, mood change Tolerates injection- not PO     Outpatient Prescriptions Prior to Visit  Medication Sig Dispense Refill  . albuterol (PROAIR HFA) 108 (90 BASE) MCG/ACT inhaler Inhale 2 puffs into the lungs every 4 (four) hours as needed. 1 Inhaler 3  . albuterol (PROVENTIL) (2.5 MG/3ML) 0.083% nebulizer solution Take 2.5 mg by nebulization as needed. Take 2.5 mg by nebulization 2 (two) times daily. And as needed    . amLODipine (NORVASC) 10 MG tablet Take 10 mg by mouth daily.    . Bevacizumab (AVASTIN IV) Inject into the vein. 1.25 mg injection into the right eye  every  8wks    . budesonide-formoterol (SYMBICORT) 160-4.5 MCG/ACT inhaler Inhale 2 puffs into the lungs 2 (two) times daily. 1 Inhaler 11  . cetirizine (ZYRTEC) 10 MG tablet Take 10 mg by mouth at bedtime.     . cholecalciferol (VITAMIN D) 1000 UNITS tablet Take 1,000 Units by mouth daily.    . Cinnamon 500 MG capsule Take 500 mg by mouth daily.    . Coenzyme Q10 (CO Q 10) 10 MG CAPS Take 1 capsule by mouth daily.      Marland Kitchen dextromethorphan-guaiFENesin (MUCINEX DM) 30-600 MG per 12 hr tablet Take 1 tablet by mouth every 12 (twelve) hours as needed.     . diazepam (VALIUM) 5 MG tablet Take 0.5-1 tablets (2.5-5 mg total) by mouth every 6 (six) hours as needed for muscle spasms or sedation. 40 tablet 1  . diclofenac (VOLTAREN) 75 MG EC tablet Take 75 mg by mouth 2 (two) times daily.      Marland Kitchen esomeprazole (NEXIUM) 40 MG capsule Take 40 mg by mouth daily at 12 noon.    . famotidine  (PEPCID) 20 MG tablet Take 1 tablet (20 mg total) by mouth 2 (two) times daily. When coughing 60 tablet 6  . Homeopathic Products ([employer] LEG CRAMPS PAIN RELIEF PO) Per bottle as needed    . ipratropium (ATROVENT) 0.02 % nebulizer solution Take 500 mcg by nebulization as needed. And as needed    . losartan (COZAAR) 25 MG tablet Take 25 mg by mouth daily.    . Magnesium 400 MG CAPS Take 4 capsules by mouth daily.     . mometasone (NASONEX) 50 MCG/ACT nasal spray Place 1 spray into the nose daily. 17 g 11  . traMADol (ULTRAM) 50 MG tablet Take 50 mg by mouth every 6 (six) hours as needed.      . vitamin C (ASCORBIC ACID) 500 MG tablet Take 500 mg by mouth daily.    . chlorpheniramine-HYDROcodone (TUSSIONEX PENNKINETIC ER) 10-8 MG/5ML LQCR Take 5 mLs by mouth every 12 (twelve) hours as needed. 140 mL 0   No facility-administered medications prior to visit.        Objective:   Physical Exam Filed Vitals:   11/08/14 0934  BP: 118/66  Pulse: 75  Temp: 97.5 F (36.4 C)  TempSrc: Oral  Height: 5\' 4"  (1.626 m)  Weight: 163 lb (73.936 kg)  SpO2: 92%   Gen: Pleasant, well-nourished, in no distress,  normal affect  ENT: No lesions,  mouth clear,  oropharynx clear, no postnasal drip  Neck: No JVD, no TMG, no carotid bruits  Lungs: No use of accessory muscles, Distantclear without rales or rhonchi  Cardiovascular: RRR, heart sounds normal, no murmur or gallops, no peripheral edema  Musculoskeletal: No deformities, no cyanosis or clubbing  Neuro: alert, non focal  Skin: Warm, no lesions or rashes      Assessment & Plan:  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 She not wheezing she does not have productive cough. I do not believe this is an exacerbation of COPD at this time.. Based on Her history she is at risk for a flare. We discussed the signs and symptoms of an acute exacerbation today and I wrote for  prescriptions for prednisone and doxycycline for her to take if these evolve. I gave her prescription for her Tussionex at her request

## 2014-11-08 NOTE — Patient Instructions (Addendum)
Suspect that you are having an increase in your allergy symptoms due to the change in seasons. This patient should have risk for a flare of your COPD as has happened before.  We will give you prescriptions for a taper of prednisone, doxycycline antibiotic. Please fill these if you develop more cough, more sputum, change in the color of her sputum, fever, wheezing or more shortness of breath. Once you fill them take them completely.  Try using over-the-counter decongestants and cough suppressants like Tylenol Cold and flu or TheraFlu Take Tussionex 5 mL up to every 12 hours as needed for cough Continue your other medications including Zyrtec and Nasonex, Symbicort, albuterol Follow with Dr Joya Gaskins in 3 months or sooner if you have any problems.

## 2014-11-08 NOTE — Assessment & Plan Note (Signed)
Respiratory infection versus flare of her allergies. She not wheezing she does not have productive cough. I do not believe this is an exacerbation of COPD at this time.. Based on Her history she is at risk for a flare. We discussed the signs and symptoms of an acute exacerbation today and I wrote for prescriptions for prednisone and doxycycline for her to take if these evolve. I gave her prescription for her Tussionex at her request

## 2014-11-14 ENCOUNTER — Telehealth: Payer: Self-pay | Admitting: Emergency Medicine

## 2014-11-14 NOTE — Telephone Encounter (Signed)
Continue the doxy and the pred as ordered. Doxy is superior in this situation. I don;t anticipate the doxy will stop the cough immediately, but it has decreased her phlegm which is a good thing. Very likely that there are non-infectious contributors to the cough - abx may not resolve it fully.

## 2014-11-14 NOTE — Telephone Encounter (Signed)
I called spoke with pt, made aware of below. She will finish ABX/pred as prescribed. Nothing further needed

## 2014-11-14 NOTE — Telephone Encounter (Signed)
Per 11/08/14; Patient Instructions        Suspect that you are having an increase in your allergy symptoms due to the change in seasons. This patient should have risk for a flare of your COPD as has happened before.   We will give you prescriptions for a taper of prednisone, doxycycline antibiotic. Please fill these if you develop more cough, more sputum, change in the color of her sputum, fever, wheezing or more shortness of breath. Once you fill them take them completely.   Try using over-the-counter decongestants and cough suppressants like Tylenol Cold and flu or TheraFlu Take Tussionex 5 mL up to every 12 hours as needed for cough Continue your other medications including Zyrtec and Nasonex, Symbicort, albuterol Follow with Dr Joya Gaskins in 3 months or sooner if you have any problems.  --  Spoke with pt. She reports she is scheduled for pre-op on Monday for eye surgery. She has started the doxy and prednisone. She wants augmentin called in bc she does feel doxy is helping her. She reports she has deep cough w/ very little phlem prod now (unsure color) and is wheezing. Pt is taking tussionex BID, tylenol cold and flu as well. Please advise RB thanks

## 2014-11-26 ENCOUNTER — Ambulatory Visit: Admission: RE | Admit: 2014-11-26 | Payer: Medicare HMO | Source: Ambulatory Visit | Admitting: Ophthalmology

## 2014-11-26 ENCOUNTER — Encounter: Admission: RE | Payer: Self-pay | Source: Ambulatory Visit

## 2014-11-26 SURGERY — PHACOEMULSIFICATION, CATARACT, WITH IOL INSERTION
Anesthesia: Choice | Laterality: Left

## 2014-12-28 ENCOUNTER — Encounter: Payer: Self-pay | Admitting: *Deleted

## 2014-12-31 ENCOUNTER — Encounter: Payer: Self-pay | Admitting: Ophthalmology

## 2014-12-31 ENCOUNTER — Ambulatory Visit: Payer: Medicare HMO | Admitting: Certified Registered"

## 2014-12-31 ENCOUNTER — Ambulatory Visit
Admission: RE | Admit: 2014-12-31 | Discharge: 2014-12-31 | Disposition: A | Discharge status: Home / Self Care | Payer: Medicare HMO | Source: Ambulatory Visit | Attending: Ophthalmology | Admitting: Ophthalmology

## 2014-12-31 ENCOUNTER — Encounter
Admission: RE | Disposition: A | Discharge status: Home / Self Care | Payer: Self-pay | Source: Ambulatory Visit | Attending: Ophthalmology

## 2014-12-31 DIAGNOSIS — E118 Type 2 diabetes mellitus with unspecified complications: Secondary | ICD-10-CM | POA: Insufficient documentation

## 2014-12-31 DIAGNOSIS — H9190 Unspecified hearing loss, unspecified ear: Secondary | ICD-10-CM | POA: Insufficient documentation

## 2014-12-31 DIAGNOSIS — Z96612 Presence of left artificial shoulder joint: Secondary | ICD-10-CM | POA: Diagnosis not present

## 2014-12-31 DIAGNOSIS — H2512 Age-related nuclear cataract, left eye: Secondary | ICD-10-CM | POA: Diagnosis not present

## 2014-12-31 DIAGNOSIS — Z87891 Personal history of nicotine dependence: Secondary | ICD-10-CM | POA: Diagnosis not present

## 2014-12-31 DIAGNOSIS — Z6827 Body mass index (BMI) 27.0-27.9, adult: Secondary | ICD-10-CM | POA: Diagnosis not present

## 2014-12-31 DIAGNOSIS — M1990 Unspecified osteoarthritis, unspecified site: Secondary | ICD-10-CM | POA: Diagnosis not present

## 2014-12-31 DIAGNOSIS — R062 Wheezing: Secondary | ICD-10-CM | POA: Insufficient documentation

## 2014-12-31 DIAGNOSIS — M797 Fibromyalgia: Secondary | ICD-10-CM | POA: Insufficient documentation

## 2014-12-31 DIAGNOSIS — E669 Obesity, unspecified: Secondary | ICD-10-CM | POA: Insufficient documentation

## 2014-12-31 DIAGNOSIS — K219 Gastro-esophageal reflux disease without esophagitis: Secondary | ICD-10-CM | POA: Diagnosis not present

## 2014-12-31 DIAGNOSIS — I10 Essential (primary) hypertension: Secondary | ICD-10-CM | POA: Diagnosis not present

## 2014-12-31 DIAGNOSIS — E7800 Pure hypercholesterolemia, unspecified: Secondary | ICD-10-CM | POA: Insufficient documentation

## 2014-12-31 DIAGNOSIS — R05 Cough: Secondary | ICD-10-CM | POA: Diagnosis not present

## 2014-12-31 HISTORY — DX: Reserved for inherently not codable concepts without codable children: IMO0001

## 2014-12-31 HISTORY — DX: Fracture of unspecified shoulder girdle, part unspecified, initial encounter for closed fracture: S42.90XA

## 2014-12-31 HISTORY — DX: Fibromyalgia: M79.7

## 2014-12-31 HISTORY — PX: CATARACT EXTRACTION W/PHACO: SHX586

## 2014-12-31 HISTORY — DX: Unspecified hearing loss, unspecified ear: H91.90

## 2014-12-31 HISTORY — DX: Bronchitis, not specified as acute or chronic: J40

## 2014-12-31 LAB — GLUCOSE, CAPILLARY: Glucose-Capillary: 103 mg/dL — ABNORMAL HIGH (ref 65–99)

## 2014-12-31 SURGERY — PHACOEMULSIFICATION, CATARACT, WITH IOL INSERTION
Anesthesia: Monitor Anesthesia Care | Site: Eye | Laterality: Left | Wound class: Clean

## 2014-12-31 MED ORDER — BSS IO SOLN
INTRAOCULAR | Status: DC | PRN
  Administered 2014-12-31: 1 mL via OPHTHALMIC

## 2014-12-31 MED ORDER — NA CHONDROIT SULF-NA HYALURON 40-17 MG/ML IO SOLN
INTRAOCULAR | Status: DC | PRN
  Administered 2014-12-31: 1 mL via INTRAOCULAR

## 2014-12-31 MED ORDER — CEFUROXIME OPHTHALMIC INJECTION 1 MG/0.1 ML
INJECTION | OPHTHALMIC | Status: DC | PRN
  Administered 2014-12-31: 0.1 mL via INTRACAMERAL

## 2014-12-31 MED ORDER — EPINEPHRINE HCL 1 MG/ML IJ SOLN
INTRAMUSCULAR | Status: AC
  Filled 2014-12-31: qty 2

## 2014-12-31 MED ORDER — LIDOCAINE HCL (PF) 4 % IJ SOLN
INTRAMUSCULAR | Status: AC
  Filled 2014-12-31: qty 10

## 2014-12-31 MED ORDER — BUPIVACAINE HCL (PF) 0.75 % IJ SOLN
INTRAMUSCULAR | Status: AC
  Filled 2014-12-31: qty 10

## 2014-12-31 MED ORDER — SODIUM CHLORIDE 0.9 % IV SOLN
INTRAVENOUS | Status: DC
Start: 2014-12-31 — End: 2014-12-31
  Administered 2014-12-31: 09:00:00 via INTRAVENOUS

## 2014-12-31 MED ORDER — HYALURONIDASE HUMAN 150 UNIT/ML IJ SOLN
INTRAMUSCULAR | Status: AC
  Filled 2014-12-31: qty 1

## 2014-12-31 MED ORDER — CARBACHOL 0.01 % IO SOLN
INTRAOCULAR | Status: DC | PRN
  Administered 2014-12-31: 0.5 mL via INTRAOCULAR

## 2014-12-31 MED ORDER — TETRACAINE HCL 0.5 % OP SOLN
OPHTHALMIC | Status: AC
  Filled 2014-12-31: qty 2

## 2014-12-31 MED ORDER — PHENYLEPHRINE HCL 10 % OP SOLN
1.0000 [drp] | OPHTHALMIC | Status: AC | PRN
  Administered 2014-12-31 (×4): 1 [drp] via OPHTHALMIC

## 2014-12-31 MED ORDER — CYCLOPENTOLATE HCL 2 % OP SOLN
1.0000 [drp] | OPHTHALMIC | Status: AC
  Administered 2014-12-31 (×3): 1 [drp] via OPHTHALMIC
  Administered 2014-12-31: 08:00:00 via OPHTHALMIC

## 2014-12-31 MED ORDER — NA CHONDROIT SULF-NA HYALURON 40-17 MG/ML IO SOLN
INTRAOCULAR | Status: AC
  Filled 2014-12-31: qty 1

## 2014-12-31 MED ORDER — PHENYLEPHRINE HCL 10 % OP SOLN
OPHTHALMIC | Status: AC
  Administered 2014-12-31: 1 [drp] via OPHTHALMIC
  Filled 2014-12-31: qty 5

## 2014-12-31 MED ORDER — ALFENTANIL 500 MCG/ML IJ INJ
INJECTION | INTRAMUSCULAR | Status: DC | PRN
  Administered 2014-12-31: 500 ug via INTRAVENOUS

## 2014-12-31 MED ORDER — LIDOCAINE HCL (PF) 4 % IJ SOLN
INTRAMUSCULAR | Status: DC | PRN
  Administered 2014-12-31: 4 mL via OPHTHALMIC

## 2014-12-31 MED ORDER — MOXIFLOXACIN HCL 0.5 % OP SOLN
OPHTHALMIC | Status: DC | PRN
  Administered 2014-12-31: 1 [drp] via OPHTHALMIC

## 2014-12-31 MED ORDER — MIDAZOLAM HCL 2 MG/2ML IJ SOLN
INTRAMUSCULAR | Status: DC | PRN
  Administered 2014-12-31: 0.5 mg via INTRAVENOUS
  Administered 2014-12-31: 1 mg via INTRAVENOUS

## 2014-12-31 MED ORDER — CEFUROXIME OPHTHALMIC INJECTION 1 MG/0.1 ML
INJECTION | OPHTHALMIC | Status: AC
  Filled 2014-12-31: qty 0.1

## 2014-12-31 MED ORDER — MOXIFLOXACIN HCL 0.5 % OP SOLN
OPHTHALMIC | Status: AC
  Administered 2014-12-31: 1 [drp] via OPHTHALMIC
  Filled 2014-12-31: qty 3

## 2014-12-31 MED ORDER — TETRACAINE HCL 0.5 % OP SOLN
OPHTHALMIC | Status: DC | PRN
  Administered 2014-12-31: 1 [drp] via OPHTHALMIC

## 2014-12-31 MED ORDER — CYCLOPENTOLATE HCL 2 % OP SOLN
OPHTHALMIC | Status: AC
  Filled 2014-12-31: qty 2

## 2014-12-31 MED ORDER — LIDOCAINE HCL (PF) 4 % IJ SOLN
INTRAOCULAR | Status: DC | PRN
  Administered 2014-12-31: 09:00:00 via OPHTHALMIC

## 2014-12-31 MED ORDER — MOXIFLOXACIN HCL 0.5 % OP SOLN
1.0000 [drp] | OPHTHALMIC | Status: AC
  Administered 2014-12-31 (×3): 1 [drp] via OPHTHALMIC

## 2014-12-31 SURGICAL SUPPLY — 30 items
CANNULA ANT/CHMB 27G (MISCELLANEOUS) ×1 IMPLANT
CANNULA ANT/CHMB 27GA (MISCELLANEOUS) ×3 IMPLANT
CORD BIP STRL DISP 12FT (MISCELLANEOUS) ×3 IMPLANT
CUP MEDICINE 2OZ PLAST GRAD ST (MISCELLANEOUS) ×3 IMPLANT
DRAPE XRAY CASSETTE 23X24 (DRAPES) ×3 IMPLANT
ERASER HMR WETFIELD 18G (MISCELLANEOUS) ×3 IMPLANT
GLOVE BIO SURGEON STRL SZ8 (GLOVE) ×3 IMPLANT
GLOVE SURG LX 6.5 MICRO (GLOVE) ×2
GLOVE SURG LX 8.0 MICRO (GLOVE) ×2
GLOVE SURG LX STRL 6.5 MICRO (GLOVE) ×1 IMPLANT
GLOVE SURG LX STRL 8.0 MICRO (GLOVE) ×1 IMPLANT
GOWN STRL REUS W/ TWL LRG LVL3 (GOWN DISPOSABLE) ×1 IMPLANT
GOWN STRL REUS W/ TWL XL LVL3 (GOWN DISPOSABLE) ×1 IMPLANT
GOWN STRL REUS W/TWL LRG LVL3 (GOWN DISPOSABLE) ×3
GOWN STRL REUS W/TWL XL LVL3 (GOWN DISPOSABLE) ×3
LENS IOL ACRYSOF IQ 21.5 (Intraocular Lens) ×2 IMPLANT
PACK CATARACT (MISCELLANEOUS) ×3 IMPLANT
PACK CATARACT DINGLEDEIN LX (MISCELLANEOUS) ×3 IMPLANT
PACK EYE AFTER SURG (MISCELLANEOUS) ×3 IMPLANT
SHLD EYE VISITEC  UNIV (MISCELLANEOUS) ×3 IMPLANT
SOL BSS BAG (MISCELLANEOUS) ×3
SOL PREP PVP 2OZ (MISCELLANEOUS) ×3
SOLUTION BSS BAG (MISCELLANEOUS) ×1 IMPLANT
SOLUTION PREP PVP 2OZ (MISCELLANEOUS) ×1 IMPLANT
SUT SILK 5-0 (SUTURE) ×3 IMPLANT
SYR 3ML LL SCALE MARK (SYRINGE) ×3 IMPLANT
SYR 5ML LL (SYRINGE) ×3 IMPLANT
SYR TB 1ML 27GX1/2 LL (SYRINGE) ×3 IMPLANT
WATER STERILE IRR 1000ML POUR (IV SOLUTION) ×3 IMPLANT
WIPE NON LINTING 3.25X3.25 (MISCELLANEOUS) ×3 IMPLANT

## 2014-12-31 NOTE — Anesthesia Postprocedure Evaluation (Signed)
  Anesthesia Post-op Note  Patient: GENEIVEVE DEUS  Procedure(s) Performed: Procedure(s) with comments: CATARACT EXTRACTION PHACO AND INTRAOCULAR LENS PLACEMENT ([facility]) (Left) - [place] 01:00 AP% 24.0 CDE 25.53 fluid pack # [id number]  Anesthesia type:MAC  Patient location: PACU  Post pain: Pain level controlled  Post assessment: Post-op Vital signs reviewed, Patient's Cardiovascular Status Stable, Respiratory Function Stable, Patent Airway and No signs of Nausea or vomiting  Post vital signs: Reviewed and stable  Last Vitals:  Filed Vitals:   12/31/14 0946  BP: 153/63  Pulse:   Temp: 36.9 C  Resp: 18    Level of consciousness: awake, alert  and patient cooperative  Complications: No apparent anesthesia complications

## 2014-12-31 NOTE — Interval H&P Note (Signed)
History and Physical Interval Note:  12/31/2014 8:57 AM  Kristin Estrada  has presented today for surgery, with the diagnosis of CATARACT  The various methods of treatment have been discussed with the patient and family. After consideration of risks, benefits and other options for treatment, the patient has consented to  Procedure(s): CATARACT EXTRACTION PHACO AND INTRAOCULAR LENS PLACEMENT ([place]) (Left) as a surgical intervention .  The patient's history has been reviewed, patient examined, no change in status, stable for surgery.  I have reviewed the patient's chart and labs.  Questions were answered to the patient's satisfaction.     Dejohn Ibarra

## 2014-12-31 NOTE — H&P (Signed)
See scanned note.

## 2014-12-31 NOTE — Interval H&P Note (Signed)
History and Physical Interval Note:  12/31/2014 7:31 AM  Kristin Estrada  has presented today for surgery, with the diagnosis of CATARACT  The various methods of treatment have been discussed with the patient and family. After consideration of risks, benefits and other options for treatment, the patient has consented to  Procedure(s): CATARACT EXTRACTION PHACO AND INTRAOCULAR LENS PLACEMENT ([place]) (Left) as a surgical intervention .  The patient's history has been reviewed, patient examined, no change in status, stable for surgery.  I have reviewed the patient's chart and labs.  Questions were answered to the patient's satisfaction.     Kristin Estrada

## 2014-12-31 NOTE — Anesthesia Preprocedure Evaluation (Signed)
Anesthesia Evaluation  Patient identified by MRN, date of birth, ID band Patient awake    Reviewed: Allergy & Precautions, NPO status , Patient's Chart, lab work & pertinent test results  Airway Mallampati: III       Dental  (+) Upper Dentures, Lower Dentures   Pulmonary shortness of breath, COPD, former smoker,     + decreased breath sounds      Cardiovascular Exercise Tolerance: Poor hypertension, Pt. on medications  Rhythm:Regular Rate:Normal     Neuro/Psych    GI/Hepatic Neg liver ROS, GERD  ,  Endo/Other  diabetes, Type 2  Renal/GU negative Renal ROS     Musculoskeletal   Abdominal (+) + obese,   Peds  Hematology   Anesthesia Other Findings   Reproductive/Obstetrics                             Anesthesia Physical Anesthesia Plan  ASA: III  Anesthesia Plan: MAC   Post-op Pain Management:    Induction: Intravenous  Airway Management Planned: Nasal Cannula  Additional Equipment:   Intra-op Plan:   Post-operative Plan:   Informed Consent: I have reviewed the patients History and Physical, chart, labs and discussed the procedure including the risks, benefits and alternatives for the proposed anesthesia with the patient or authorized representative who has indicated his/her understanding and acceptance.     Plan Discussed with: CRNA  Anesthesia Plan Comments:         Anesthesia Quick Evaluation

## 2014-12-31 NOTE — Transfer of Care (Signed)
Immediate Anesthesia Transfer of Care Note  Patient: Kristin Estrada  Procedure(s) Performed: Procedure(s) with comments: CATARACT EXTRACTION PHACO AND INTRAOCULAR LENS PLACEMENT ([facility]) (Left) - [place] 01:00 AP% 24.0 CDE 25.53 fluid pack # [id number]  Patient Location: Short Stay  Anesthesia Type:MAC  Level of Consciousness: awake and alert   Airway & Oxygen Therapy: Patient Spontanous Breathing  Post-op Assessment: Report given to RN  Post vital signs: Reviewed  Last Vitals:  Filed Vitals:   12/31/14 0946  BP: 153/63  Pulse:   Temp: 36.9 C  Resp: 18    Complications: No apparent anesthesia complications

## 2014-12-31 NOTE — Op Note (Signed)
Date of Surgery: 12/31/2014 Date of Dictation: 12/31/2014 9:42 AM Pre-operative Diagnosis:  Nuclear Sclerotic Cataract left Eye Post-operative Diagnosis: same Procedure performed: Extra-capsular Cataract Extraction (ECCE) with placement of a posterior chamber intraocular lens (IOL) left Eye IOL:  Implant Name Type Inv. Item Serial No. Manufacturer Lot No. LRB No. Used  LENS IOL ACRYSOF IQ 21.5 - [id number] Intraocular Lens LENS IOL ACRYSOF IQ 21.5 [id number] ALCON   Left 1   Anesthesia: 2% Lidocaine and 4% Marcaine in a 50/50 mixture with 10 unites/ml of Hylenex given as a peribulbar Anesthesiologist: Anesthesiologist: Gijsbertus Lonia Mad, MD CRNA: Rolla Plate, CRNA Complications: none Estimated Blood Loss: less than 1 ml  Description of procedure:  The patient was given anesthesia and sedation via intravenous access. The patient was then prepped and draped in the usual fashion. A 25-gauge needle was bent for initiating the capsulorhexis. A 5-0 silk suture was placed through the conjunctiva superior and inferiorly to serve as bridle sutures. Hemostasis was obtained at the superior limbus using an eraser cautery. A partial thickness groove was made at the anterior surgical limbus with a 64 Beaver blade and this was dissected anteriorly with an [employer]. The anterior chamber was entered at 10 o'clock with a 1.0 mm paracentesis knife and through the lamellar dissection with a 2.6 mm Alcon keratome. Epi-Shugarcaine 0.5 CC [9 cc BSS Plus (Alcon), 3 cc 4% preservative-free lidocaine (Hospira) and 4 cc 1:1000 preservative-free, bisulfite-free epinephrine] was injected into the anterior chamber via the paracentesis tract. Epi-Shugarcaine 0.5 CC [9 cc BSS Plus (Alcon), 3 cc 4% preservative-free lidocaine (Hospira) and 4 cc 1:1000 preservative-free, bisulfite-free epinephrine] was injected into the anterior chamber via the paracentesis tract. DiscoVisc was injected to replace the  aqueous and a continuous tear curvilinear capsulorhexis was performed using a bent 25-gauge needle.  Balance salt on a syringe was used to perform hydro-dissection and phacoemulsification was carried out using a divide and conquer technique. Procedure(s) with comments: CATARACT EXTRACTION PHACO AND INTRAOCULAR LENS PLACEMENT ([facility]) (Left) - [place] 01:00 AP% 24.0 CDE 25.53 fluid pack # [id number]. Irrigation/aspiration was used to remove the residual cortex and the capsular bag was inflated with DiscoVisc. The intraocular lens was inserted into the capsular bag using a pre-loaded UltraSert Delivery System. Irrigation/aspiration was used to remove the residual DiscoVisc. The wound was inflated with balanced salt and checked for leaks. None were found. Miostat was injected via the paracentesis track and 0.1 ml of cefuroxime containing 1 mg of drug  was injected via the paracentesis track. The wound was checked for leaks again and none were found.   The bridal sutures were removed and two drops of Vigamox were placed on the eye. An eye shield was placed to protect the eye and the patient was discharged to the recovery area in good condition.   Graycie Halley MD

## 2014-12-31 NOTE — Discharge Instructions (Signed)
AMBULATORY SURGERY  DISCHARGE INSTRUCTIONS   1) The drugs that you were given will stay in your system until tomorrow so for the next 24 hours you should not:  A) Drive an automobile B) Make any legal decisions C) Drink any alcoholic beverage   2) You may resume regular meals tomorrow.  Today it is better to start with liquids and gradually work up to solid foods.  You may eat anything you prefer, but it is better to start with liquids, then soup and crackers, and gradually work up to solid foods.   3) Please notify your doctor immediately if you have any unusual bleeding, trouble breathing, redness and pain at the surgery site, drainage, fever, or pain not relieved by medication.    4) Additional Instructions:    Eye Surgery Discharge Instructions  Expect mild scratchy sensation or mild soreness. DO NOT RUB YOUR EYE!  The day of surgery:  Minimal physical activity, but bed rest is not required  No reading, computer work, or close hand work  No bending, lifting, or straining.  May watch TV  For 24 hours:  No driving, legal decisions, or alcoholic beverages  Safety precautions  Eat anything you prefer: It is better to start with liquids, then soup then solid foods.  _____ Eye patch should be worn until postoperative exam tomorrow.  ____ Solar shield eyeglasses should be worn for comfort in the sunlight/patch while sleeping  Resume all regular medications including aspirin or Coumadin if these were discontinued prior to surgery. You may shower, bathe, shave, or wash your hair. Tylenol may be taken for mild discomfort.  Call your doctor if you experience significant pain, nausea, or vomiting, fever > 101 or other signs of infection. [phone] or [phone] Specific instructions:  Follow-up Information    Follow up with Horacio Werth, MD In 1 day.   Specialty:  Ophthalmology   Why:  November 15 at 10:35am   Contact information:   [address]   [place] [address] [phone]         Please contact your physician with any problems or Same Day Surgery at [phone], Monday through Friday 6 am to 4 pm, or [place] at [facility] number at [phone].

## 2015-02-01 ENCOUNTER — Ambulatory Visit (INDEPENDENT_AMBULATORY_CARE_PROVIDER_SITE_OTHER): Payer: Medicare HMO | Admitting: Pulmonary Disease

## 2015-02-01 ENCOUNTER — Encounter: Payer: Self-pay | Admitting: Pulmonary Disease

## 2015-02-01 VITALS — BP 130/72 | HR 70 | Ht 64.0 in | Wt 166.8 lb

## 2015-02-01 DIAGNOSIS — J440 Chronic obstructive pulmonary disease with acute lower respiratory infection: Secondary | ICD-10-CM

## 2015-02-01 DIAGNOSIS — J441 Chronic obstructive pulmonary disease with (acute) exacerbation: Secondary | ICD-10-CM | POA: Diagnosis not present

## 2015-02-01 MED ORDER — METHYLPREDNISOLONE ACETATE 80 MG/ML IJ SUSP: 80.0000 mg | Freq: Once | INTRAMUSCULAR | Status: DC

## 2015-02-01 MED ORDER — METHYLPREDNISOLONE ACETATE 80 MG/ML IJ SUSP
80.0000 mg | Freq: Once | INTRAMUSCULAR | Status: AC
  Administered 2015-02-01: 80 mg via INTRAMUSCULAR

## 2015-02-01 NOTE — Progress Notes (Signed)
PROBLEMS: COPD - Gold B. Followed by Dr Joya Gaskins since 2007. Initial diagnosis 2006 when hospitalized for AECOPD. Smoker of up to 1 ppd until 2006. Never hospitalized since. Moderate exertional limitation at baseline. Able to perform ADLs, ambulate 50-100 yds, one flight of stairs. Spirometry 01/2014 revealed FEV1 60% predicted.  INTERVAL HISTORY: Increased cough and dyspnea. Seen by primary provider approx one week ago. Prescribed amoxicillin and 4 days of prednisone 20 mg daily  SUBJ: Continues to report dyspnea worse than baseline and scant white-yellow mucus. Somewhat better than when she was initially treated by her primary MD, Dr Benny Lennert. Denies fever, hemoptysis, CP, LE edema, calf tenderness Current pulmonary medications: Symbicort, PRN albuterol  OBJ: Filed Vitals:   02/01/15 1118  BP: 130/72  Pulse: 70  Height: 5\' 4"  (1.626 m)  Weight: 166 lb 12.8 oz (75.66 kg)  SpO2: 96%    Gen: WDWN in NAD HEENT: All WNL Neck: NO LAN, no JVD noted Lungs: moderately diminished BS, few scattered wheezes Cardiovascular: Reg rate, normal rhythm, no M noted Abdomen: Soft, NT +BS Ext: no C/C/E Neuro: CNs intact, motor/sens grossly intact   DATA:   IMPRESSION: COPD, Gold B Bronchitis, chronic obstructive w acute bronchitis COPD exacerbation - still wheezing  PLAN: Depomedrol 80 mg X 1 today (note previous intolerance to prednisone) Compelte amoxicillin as prescribed by Dr Dulce Sellar Cont Symbicort, PRN albuterol She is to call next week if not improving - would consider doxycycline if symptoms do not resolve ROV 4 wks with CXR  Wilhelmina Mcardle, MD [facility] [place] [facility]M

## 2015-02-06 ENCOUNTER — Telehealth: Payer: Self-pay

## 2015-02-06 MED ORDER — DOXYCYCLINE HYCLATE 100 MG PO TABS: 100.0000 mg | ORAL_TABLET | Freq: Two times a day (BID) | ORAL | Status: DC

## 2015-02-06 NOTE — Telephone Encounter (Signed)
Pt states she needs a antibiotic. States she is still wheezing. Please call

## 2015-02-06 NOTE — Telephone Encounter (Signed)
Pt calls back today with still coughing and wheezing with some congestion. Was seen by DS on 02/01/15. Finished Amoxicillin given by another provider on 02/01/15. DS plan from 02/01/15 listed below. Please advise.  PLAN: Depomedrol 80 mg X 1 today (note previous intolerance to prednisone) Compelte amoxicillin as prescribed by Dr Dulce Sellar Cont Symbicort, PRN albuterol She is to call next week if not improving - would consider doxycycline if symptoms do not resolve ROV 4 wks with CXR

## 2015-02-06 NOTE — Telephone Encounter (Signed)
Medication sent. Pt informed. Nothing further needed.

## 2015-02-06 NOTE — Telephone Encounter (Signed)
Plan as follows - Doxycycline 100mg  - 1 tab PO BID x 10 days.  - albuterol inhaler - 2puff every 6 hours for 3 days, then 2puff as needed every 3-4 hours as needed for shortness of breath\wheezing\recurrent cough

## 2015-03-01 ENCOUNTER — Encounter: Payer: Self-pay | Admitting: Pulmonary Disease

## 2015-03-01 ENCOUNTER — Ambulatory Visit (INDEPENDENT_AMBULATORY_CARE_PROVIDER_SITE_OTHER): Payer: PPO | Admitting: Pulmonary Disease

## 2015-03-01 ENCOUNTER — Ambulatory Visit
Admission: RE | Admit: 2015-03-01 | Discharge: 2015-03-01 | Disposition: A | Discharge status: Home / Self Care | Payer: PPO | Source: Ambulatory Visit | Attending: Pulmonary Disease | Admitting: Pulmonary Disease

## 2015-03-01 VITALS — BP 132/80 | HR 89 | Ht 64.0 in | Wt 167.0 lb

## 2015-03-01 DIAGNOSIS — J440 Chronic obstructive pulmonary disease with acute lower respiratory infection: Secondary | ICD-10-CM | POA: Insufficient documentation

## 2015-03-01 DIAGNOSIS — J40 Bronchitis, not specified as acute or chronic: Secondary | ICD-10-CM | POA: Diagnosis not present

## 2015-03-01 DIAGNOSIS — J449 Chronic obstructive pulmonary disease, unspecified: Secondary | ICD-10-CM | POA: Diagnosis not present

## 2015-03-01 DIAGNOSIS — J984 Other disorders of lung: Secondary | ICD-10-CM | POA: Insufficient documentation

## 2015-03-01 DIAGNOSIS — R921 Mammographic calcification found on diagnostic imaging of breast: Secondary | ICD-10-CM | POA: Diagnosis not present

## 2015-03-01 NOTE — Progress Notes (Signed)
PROBLEMS: COPD - Gold B. Followed by Dr Joya Gaskins since 2007. Initial diagnosis 2006 when hospitalized for AECOPD. Smoker of up to 1 ppd until 2006. Never hospitalized since. Moderate exertional limitation at baseline. Able to perform ADLs, ambulate 50-100 yds, one flight of stairs. Spirometry 01/2014 revealed FEV1 60% predicted.  INTERVAL HISTORY: Since last visit, she called with persistent symptoms and was prescribed doxyxycline  SUBJ: Now back to baseline with full resolution of her symptoms. No new complaints. Denies productive cough, hemoptysis, CP, fever, LE edema, calf tenderness Current pulmonary medications: Symbicort, PRN albuterol  OBJ: Filed Vitals:   03/01/15 1116  BP: 132/80  Pulse: 89  Height: 5\' 4"  (1.626 m)  Weight: 167 lb (75.751 kg)  SpO2: 93%    Gen: WDWN in NAD HEENT: All WNL Neck: NO LAN, no JVD noted Lungs: moderately diminished BS, no wheezes Cardiovascular: Reg rate, normal rhythm, no M noted Abdomen: Soft, NT +BS Ext: no C/C/E Neuro: CNs intact, motor/sens grossly intact   DATA:   IMPRESSION: COPD, Gold B Bronchitis, chronic obstructive COPD exacerbation - resolved  PLAN: Cont her current maintenance medication, Symbicort, and PRN albuterol ROV 4 months  Wilhelmina Mcardle, MD [facility]/CCM

## 2015-03-14 ENCOUNTER — Other Ambulatory Visit: Payer: Self-pay | Admitting: *Deleted

## 2015-03-14 MED ORDER — BUDESONIDE-FORMOTEROL FUMARATE 160-4.5 MCG/ACT IN AERO
2.0000 | INHALATION_SPRAY | Freq: Two times a day (BID) | RESPIRATORY_TRACT | Status: DC
Start: 2015-03-14 — End: 2016-03-20

## 2015-04-24 DIAGNOSIS — H353212 Exudative age-related macular degeneration, right eye, with inactive choroidal neovascularization: Secondary | ICD-10-CM | POA: Diagnosis not present

## 2015-05-01 DIAGNOSIS — M7541 Impingement syndrome of right shoulder: Secondary | ICD-10-CM | POA: Diagnosis not present

## 2015-07-03 DIAGNOSIS — H353212 Exudative age-related macular degeneration, right eye, with inactive choroidal neovascularization: Secondary | ICD-10-CM | POA: Diagnosis not present

## 2015-07-12 ENCOUNTER — Ambulatory Visit (INDEPENDENT_AMBULATORY_CARE_PROVIDER_SITE_OTHER): Payer: PPO | Admitting: Pulmonary Disease

## 2015-07-12 ENCOUNTER — Encounter: Payer: Self-pay | Admitting: Pulmonary Disease

## 2015-07-12 VITALS — BP 140/78 | HR 83 | Ht 64.0 in | Wt 176.0 lb

## 2015-07-12 DIAGNOSIS — J449 Chronic obstructive pulmonary disease, unspecified: Secondary | ICD-10-CM

## 2015-07-15 NOTE — Progress Notes (Signed)
PROBLEMS: COPD - Gold B. Followed by Dr Joya Gaskins since 2007. Initial diagnosis 2006 when hospitalized for AECOPD. Smoker of up to 1 ppd until 2006. Never hospitalized since. Moderate exertional limitation at baseline. Able to perform ADLs, ambulate 50-100 yds, one flight of stairs. Spirometry 01/2014 revealed FEV1 60% predicted.  INTERVAL HISTORY: No events  SUBJ: No new complaints. Denies productive cough, hemoptysis, CP, fever, LE edema, calf tenderness Current pulmonary medications: Symbicort, PRN albuterol. Uses rescue albuterol 2-3 X per week on average  OBJ: Filed Vitals:   07/12/15 1139  BP: 140/78  Pulse: 83  Height: 5\' 4"  (1.626 m)  Weight: 176 lb (79.833 kg)  SpO2: 94%    Gen: WDWN in NAD HEENT: All WNL Neck: NO LAN, no JVD noted Lungs: moderately diminished BS, no wheezes Cardiovascular: Reg rate, normal rhythm, no M noted Abdomen: Soft, NT +BS Ext: no C/C/E Neuro: CNs intact, motor/sens grossly intact   DATA:   IMPRESSION: COPD, Gold B Bronchitis, chronic obstructive  PLAN: Cont her current maintenance medication, Symbicort, and PRN albuterol ROV 6 months  Merton Border, MD PCCM service Mobile [phone] Pager [phone] 07/15/2015

## 2015-08-01 DIAGNOSIS — Z1389 Encounter for screening for other disorder: Secondary | ICD-10-CM | POA: Diagnosis not present

## 2015-08-01 DIAGNOSIS — Z0000 Encounter for general adult medical examination without abnormal findings: Secondary | ICD-10-CM | POA: Diagnosis not present

## 2015-08-01 DIAGNOSIS — I10 Essential (primary) hypertension: Secondary | ICD-10-CM | POA: Diagnosis not present

## 2015-08-01 DIAGNOSIS — E785 Hyperlipidemia, unspecified: Secondary | ICD-10-CM | POA: Diagnosis not present

## 2015-08-01 DIAGNOSIS — E119 Type 2 diabetes mellitus without complications: Secondary | ICD-10-CM | POA: Diagnosis not present

## 2015-08-02 ENCOUNTER — Other Ambulatory Visit: Payer: Self-pay | Admitting: Family Medicine

## 2015-08-02 DIAGNOSIS — Z13820 Encounter for screening for osteoporosis: Secondary | ICD-10-CM

## 2015-08-26 ENCOUNTER — Telehealth: Payer: Self-pay | Admitting: Pulmonary Disease

## 2015-08-26 NOTE — Telephone Encounter (Signed)
Spoke with husband and he gave me a number to try and contact pt in regards to her call. Was given [phone]. Tried calling but no answer and VM not setup to LM.

## 2015-08-26 NOTE — Telephone Encounter (Signed)
Patient coming down with a sinus infection and she has COPD. She is out of town and will be back on Wednesday. Needs a sooner appt. Or an antibiotic called in. Please call patient.

## 2015-08-27 ENCOUNTER — Other Ambulatory Visit: Payer: Self-pay

## 2015-08-27 MED ORDER — AMOXICILLIN-POT CLAVULANATE 500-125 MG PO TABS: 1.0000 | ORAL_TABLET | Freq: Two times a day (BID) | ORAL | Status: DC

## 2015-08-27 MED ORDER — PREDNISONE 20 MG PO TABS: 20.0000 mg | ORAL_TABLET | Freq: Every day | ORAL | Status: DC

## 2015-08-27 NOTE — Telephone Encounter (Signed)
LM for pt x 1  

## 2015-08-27 NOTE — Telephone Encounter (Signed)
Tried returning pt call at [phone]. No answer & VM not setup to LM.

## 2015-08-27 NOTE — Telephone Encounter (Signed)
Patient returning call.  Please call [phone].

## 2015-08-27 NOTE — Telephone Encounter (Signed)
rx sent to [facility]. Pt aware & voices understanding. Nothing further needed.

## 2015-08-27 NOTE — Telephone Encounter (Signed)
Seems like an early AECOPD. (cough, productive sputum with change in characteristics, inc sob).   Plan: Augmentin 500mg  PO  - 1 tab PO BID x 7 days Prednisone 20 mg - 1 tab po x 5 day, please take with breakfast.   Vilinda Boehringer, MD [facility] Pager [phone] (please enter 7-digits) On Call Pager - [phone] (please enter 7-digits)

## 2015-08-27 NOTE — Telephone Encounter (Signed)
DS pt.  Pt states she is currently on her way home from out of town. She has a sinus infection that she is afraid will turn into bronchitis with her hx of COPD. C/o prod cough w/ yellow mucus, wheezing and increased SOB. Would like something called in.  VM please advise. Thanks

## 2015-09-03 ENCOUNTER — Ambulatory Visit: Payer: PPO

## 2015-09-04 DIAGNOSIS — H353212 Exudative age-related macular degeneration, right eye, with inactive choroidal neovascularization: Secondary | ICD-10-CM | POA: Diagnosis not present

## 2015-09-10 ENCOUNTER — Ambulatory Visit
Admission: RE | Admit: 2015-09-10 | Discharge: 2015-09-10 | Disposition: A | Discharge status: Home / Self Care | Payer: PPO | Source: Ambulatory Visit | Attending: Family Medicine | Admitting: Family Medicine

## 2015-09-10 DIAGNOSIS — Z87891 Personal history of nicotine dependence: Secondary | ICD-10-CM | POA: Diagnosis not present

## 2015-09-10 DIAGNOSIS — M85852 Other specified disorders of bone density and structure, left thigh: Secondary | ICD-10-CM | POA: Diagnosis not present

## 2015-09-10 DIAGNOSIS — Z780 Asymptomatic menopausal state: Secondary | ICD-10-CM | POA: Diagnosis not present

## 2015-09-10 DIAGNOSIS — Z8262 Family history of osteoporosis: Secondary | ICD-10-CM | POA: Diagnosis not present

## 2015-09-10 DIAGNOSIS — Z13820 Encounter for screening for osteoporosis: Secondary | ICD-10-CM | POA: Diagnosis not present

## 2015-09-10 DIAGNOSIS — J449 Chronic obstructive pulmonary disease, unspecified: Secondary | ICD-10-CM | POA: Insufficient documentation

## 2015-09-10 DIAGNOSIS — M8588 Other specified disorders of bone density and structure, other site: Secondary | ICD-10-CM | POA: Diagnosis not present

## 2015-10-16 DIAGNOSIS — H353212 Exudative age-related macular degeneration, right eye, with inactive choroidal neovascularization: Secondary | ICD-10-CM | POA: Diagnosis not present

## 2015-12-02 DIAGNOSIS — H353212 Exudative age-related macular degeneration, right eye, with inactive choroidal neovascularization: Secondary | ICD-10-CM | POA: Diagnosis not present

## 2015-12-24 DIAGNOSIS — H353221 Exudative age-related macular degeneration, left eye, with active choroidal neovascularization: Secondary | ICD-10-CM | POA: Diagnosis not present

## 2015-12-27 DIAGNOSIS — H353222 Exudative age-related macular degeneration, left eye, with inactive choroidal neovascularization: Secondary | ICD-10-CM | POA: Diagnosis not present

## 2016-01-13 DIAGNOSIS — H353212 Exudative age-related macular degeneration, right eye, with inactive choroidal neovascularization: Secondary | ICD-10-CM | POA: Diagnosis not present

## 2016-01-16 ENCOUNTER — Ambulatory Visit (INDEPENDENT_AMBULATORY_CARE_PROVIDER_SITE_OTHER): Payer: PPO | Admitting: Pulmonary Disease

## 2016-01-16 ENCOUNTER — Encounter: Payer: Self-pay | Admitting: Pulmonary Disease

## 2016-01-16 VITALS — BP 128/80 | HR 91 | Ht 63.0 in | Wt 173.2 lb

## 2016-01-16 DIAGNOSIS — J449 Chronic obstructive pulmonary disease, unspecified: Secondary | ICD-10-CM

## 2016-01-16 NOTE — Patient Instructions (Signed)
Continue Symbicort twice a day  Continue albuterol rescue inhaler as needed  Follow up in 6 months or as needed

## 2016-01-19 NOTE — Progress Notes (Signed)
PROBLEMS: COPD - Gold B. Followed by Dr Joya Gaskins since 2007. Initial diagnosis 2006 when hospitalized for AECOPD. Smoker of up to 1 ppd until 2006. Never hospitalized since. Moderate exertional limitation at baseline. Able to perform ADLs, ambulate 50-100 yds, one flight of stairs. Spirometry 01/2014 revealed FEV1 60% predicted.  INTERVAL HISTORY: Mild acute exacerbation in August. Treated as outpt with pred and abx. Recovered fully  SUBJ: At baseline. No new complaints. Denies productive cough, hemoptysis, CP, fever, LE edema, calf tenderness Current pulmonary medications: Symbicort, PRN albuterol. Uses rescue albuterol 2-3 X per week on average  OBJ: Vitals:   01/16/16 0959  BP: 128/80  Pulse: 91  SpO2: 93%  Weight: 173 lb 3.2 oz (78.6 kg)  Height: 5\' 3"  (1.6 m)    Gen: WDWN in NAD HEENT: All WNL Neck: NO LAN, no JVD noted Lungs: moderately diminished BS, no wheezes Cardiovascular: Reg rate, normal rhythm, no M noted Abdomen: Soft, NT +BS Ext: no C/C/E Neuro: CNs intact, motor/sens grossly intact   DATA:   IMPRESSION: COPD, Gold B Bronchitis, chronic obstructive  PLAN: Cont her current maintenance medication, Symbicort, and PRN albuterol ROV 6 months  Kristin Border, MD PCCM service Mobile [phone] Pager [phone] 01/19/2016

## 2016-01-27 DIAGNOSIS — H353122 Nonexudative age-related macular degeneration, left eye, intermediate dry stage: Secondary | ICD-10-CM | POA: Diagnosis not present

## 2016-02-24 DIAGNOSIS — H353122 Nonexudative age-related macular degeneration, left eye, intermediate dry stage: Secondary | ICD-10-CM | POA: Diagnosis not present

## 2016-02-25 DIAGNOSIS — H353122 Nonexudative age-related macular degeneration, left eye, intermediate dry stage: Secondary | ICD-10-CM | POA: Diagnosis not present

## 2016-03-02 DIAGNOSIS — H353212 Exudative age-related macular degeneration, right eye, with inactive choroidal neovascularization: Secondary | ICD-10-CM | POA: Diagnosis not present

## 2016-03-03 DIAGNOSIS — R7309 Other abnormal glucose: Secondary | ICD-10-CM | POA: Diagnosis not present

## 2016-03-03 DIAGNOSIS — J441 Chronic obstructive pulmonary disease with (acute) exacerbation: Secondary | ICD-10-CM | POA: Diagnosis not present

## 2016-03-03 DIAGNOSIS — F418 Other specified anxiety disorders: Secondary | ICD-10-CM | POA: Diagnosis not present

## 2016-03-03 DIAGNOSIS — J449 Chronic obstructive pulmonary disease, unspecified: Secondary | ICD-10-CM | POA: Diagnosis not present

## 2016-03-03 DIAGNOSIS — I10 Essential (primary) hypertension: Secondary | ICD-10-CM | POA: Diagnosis not present

## 2016-03-03 DIAGNOSIS — G4700 Insomnia, unspecified: Secondary | ICD-10-CM | POA: Diagnosis not present

## 2016-03-03 DIAGNOSIS — J42 Unspecified chronic bronchitis: Secondary | ICD-10-CM | POA: Diagnosis not present

## 2016-03-04 DIAGNOSIS — E119 Type 2 diabetes mellitus without complications: Secondary | ICD-10-CM | POA: Diagnosis not present

## 2016-03-20 ENCOUNTER — Telehealth: Payer: Self-pay | Admitting: Pulmonary Disease

## 2016-03-20 MED ORDER — BUDESONIDE-FORMOTEROL FUMARATE 160-4.5 MCG/ACT IN AERO: 2.0000 | INHALATION_SPRAY | Freq: Two times a day (BID) | RESPIRATORY_TRACT | 3 refills | Status: DC

## 2016-03-20 NOTE — Telephone Encounter (Signed)
symbicort sent to pharmacy.   Pt states Dr. Joya Gaskins would give her Nystatin to keep on hand because she does get thrush at times from the Symbicort. Please advise if we can send some in to pharmacy. Thanks.

## 2016-03-20 NOTE — Telephone Encounter (Signed)
Pt calling stating she has been trying to get some refills and no answer from [place] to her pharmacy   *STAT* If patient is at the pharmacy, call can be transferred to refill team.   1. Which medications need to be refilled? (please list name of each medication and dose if known) Symbicort   2. Which pharmacy/location (including street and city if local pharmacy) is medication to be sent to? [facility]   3. Do they need a 30 day or 90 day supply? 90 day

## 2016-03-23 DIAGNOSIS — H353122 Nonexudative age-related macular degeneration, left eye, intermediate dry stage: Secondary | ICD-10-CM | POA: Diagnosis not present

## 2016-03-24 ENCOUNTER — Telehealth: Payer: Self-pay | Admitting: Pulmonary Disease

## 2016-03-24 NOTE — Telephone Encounter (Signed)
Patient not feeling well and wants to be seen asap.  Patient saw pcp 2 weeks ago for Sinus infection she was given doxycycline and prednisone.  She woke up today with sneezing and clearing throat .  She has copd and chonic bronchitis doesn't want to get pneumonia.  Patient schedule for 2/9 at 10 am.

## 2016-03-24 NOTE — Telephone Encounter (Signed)
Spoke with Kristin Estrada, and offered sooner apt with NP at [place] office. Kristin Estrada states she would prefer to see DS at the [place] office. Kristin Estrada states she thinks she will be fine until her upcoming apt with DS on 03-27-16. I have advised Kristin Estrada to contact us if her symptoms worsens in the meantime. Kristin Estrada voiced her understanding and had no further questions. Nothing further needed.

## 2016-03-27 ENCOUNTER — Ambulatory Visit (INDEPENDENT_AMBULATORY_CARE_PROVIDER_SITE_OTHER): Payer: PPO | Admitting: Pulmonary Disease

## 2016-03-27 ENCOUNTER — Encounter: Payer: Self-pay | Admitting: Pulmonary Disease

## 2016-03-27 VITALS — BP 122/80 | HR 73 | Wt 174.0 lb

## 2016-03-27 DIAGNOSIS — J0100 Acute maxillary sinusitis, unspecified: Secondary | ICD-10-CM | POA: Diagnosis not present

## 2016-03-27 DIAGNOSIS — B370 Candidal stomatitis: Secondary | ICD-10-CM

## 2016-03-27 DIAGNOSIS — J449 Chronic obstructive pulmonary disease, unspecified: Secondary | ICD-10-CM | POA: Diagnosis not present

## 2016-03-27 MED ORDER — AMOXICILLIN-POT CLAVULANATE 875-125 MG PO TABS: 1.0000 | ORAL_TABLET | Freq: Two times a day (BID) | ORAL | 0 refills | Status: AC

## 2016-03-27 MED ORDER — NYSTATIN 100000 UNIT/ML MT SUSP: 5.0000 mL | OROMUCOSAL | Status: DC | PRN

## 2016-03-27 MED ORDER — NYSTATIN 100000 UNIT/ML MT SUSP: 5.0000 mL | OROMUCOSAL | 5 refills | Status: AC | PRN

## 2016-03-27 NOTE — Progress Notes (Signed)
PROBLEMS: COPD - Gold B. Followed by Dr Joya Gaskins since 2007. Initial diagnosis 2006 when hospitalized for AECOPD. Smoker of up to 1 ppd until 2006. Never hospitalized since. Moderate exertional limitation at baseline. Able to perform ADLs, ambulate 50-100 yds, one flight of stairs. Spirometry 01/2014 revealed FEV1 60% predicted.  INTERVAL HISTORY: No major events until current illness  SUBJ: Over past week, has developed "sinus infection" with bilateral facial pressure, epistaxis, nasal congestion, PNDS, hoarseness. Denies fever and chest symptoms. Asks for refill of Nystatin as she has tendency for thrush while on inhaled steroids  Current pulmonary medications: Symbicort, PRN albuterol. Uses rescue albuterol 2-3 X per week on average  OBJ: Vitals:   03/27/16 1002  BP: 122/80  Pulse: 73  SpO2: 97%  Weight: 174 lb (78.9 kg)    Gen: WDWN in NAD HEENT: + rhinitis Neck: NO LAN, no JVD noted Lungs: mildly diminished BS, no wheezes Cardiovascular: Reg, no M noted Abdomen: Soft, NT +BS Ext: no C/C/E Neuro: CNs intact, motor/sens grossly intact   DATA:   IMPRESSION: COPD, Gold B - controlled Acute sinusitis Recurrent oral candidiasis  PLAN: Cont her current maintenance medication, Symbicort, and PRN albuterol Augmentin BID X 7 days Nystatin solution refilled to be used PRN ROV 4 months  Merton Border, MD PCCM service Mobile [phone] Pager [phone] 03/27/2016

## 2016-03-27 NOTE — Patient Instructions (Addendum)
Augmentin twice a day for 7 days  Nystatin refilled - use as needed  Continue rest of your medications as previously  Follow up in 4 months

## 2016-05-04 DIAGNOSIS — H353212 Exudative age-related macular degeneration, right eye, with inactive choroidal neovascularization: Secondary | ICD-10-CM | POA: Diagnosis not present

## 2016-05-04 DIAGNOSIS — H353122 Nonexudative age-related macular degeneration, left eye, intermediate dry stage: Secondary | ICD-10-CM | POA: Diagnosis not present

## 2016-06-02 DIAGNOSIS — E785 Hyperlipidemia, unspecified: Secondary | ICD-10-CM | POA: Diagnosis not present

## 2016-06-02 DIAGNOSIS — B372 Candidiasis of skin and nail: Secondary | ICD-10-CM | POA: Diagnosis not present

## 2016-06-02 DIAGNOSIS — E119 Type 2 diabetes mellitus without complications: Secondary | ICD-10-CM | POA: Diagnosis not present

## 2016-06-02 DIAGNOSIS — M2550 Pain in unspecified joint: Secondary | ICD-10-CM | POA: Diagnosis not present

## 2016-06-02 DIAGNOSIS — R946 Abnormal results of thyroid function studies: Secondary | ICD-10-CM | POA: Diagnosis not present

## 2016-06-02 DIAGNOSIS — E559 Vitamin D deficiency, unspecified: Secondary | ICD-10-CM | POA: Diagnosis not present

## 2016-06-02 DIAGNOSIS — I10 Essential (primary) hypertension: Secondary | ICD-10-CM | POA: Diagnosis not present

## 2016-06-03 DIAGNOSIS — H353212 Exudative age-related macular degeneration, right eye, with inactive choroidal neovascularization: Secondary | ICD-10-CM | POA: Diagnosis not present

## 2016-06-08 DIAGNOSIS — H353122 Nonexudative age-related macular degeneration, left eye, intermediate dry stage: Secondary | ICD-10-CM | POA: Diagnosis not present

## 2016-07-01 DIAGNOSIS — H353212 Exudative age-related macular degeneration, right eye, with inactive choroidal neovascularization: Secondary | ICD-10-CM | POA: Diagnosis not present

## 2016-07-10 DIAGNOSIS — H353122 Nonexudative age-related macular degeneration, left eye, intermediate dry stage: Secondary | ICD-10-CM | POA: Diagnosis not present

## 2016-07-27 ENCOUNTER — Ambulatory Visit (INDEPENDENT_AMBULATORY_CARE_PROVIDER_SITE_OTHER): Payer: PPO | Admitting: Pulmonary Disease

## 2016-07-27 ENCOUNTER — Encounter: Payer: Self-pay | Admitting: Pulmonary Disease

## 2016-07-27 VITALS — BP 130/84 | HR 70 | Ht 63.0 in | Wt 171.0 lb

## 2016-07-27 DIAGNOSIS — J449 Chronic obstructive pulmonary disease, unspecified: Secondary | ICD-10-CM

## 2016-07-27 NOTE — Patient Instructions (Signed)
Continue Symbicort Continue albuterol as needed Follow-up with [place] as needed

## 2016-07-27 NOTE — Progress Notes (Signed)
PROBLEMS: COPD - Gold B. Followed by Dr Joya Gaskins since 2007. Initial diagnosis 2006 when hospitalized for AECOPD. Smoker of up to 1 ppd until 2006. Never hospitalized since. Moderate exertional limitation at baseline. Able to perform ADLs, ambulate 50-100 yds, one flight of stairs. Spirometry 01/2014 revealed FEV1 60% predicted.  INTERVAL HISTORY: No major events until current illness  SUBJ: No new complaints. Class II dyspnea. Minimal cough.Denies CP, fever, purulent sputum, hemoptysis, LE edema and calf tenderness. Her biggest concern is fatigue. She has been started on Cymbalta for fibromyalgia syndrome. She questions whether this might be causing her fatigue.  Current pulmonary medications: Symbicort, PRN albuterol. Uses rescue albuterol very infrequently  OBJ: Vitals:   07/27/16 1001  Weight: 171 lb (77.6 kg)  Height: 5\' 3"  (1.6 m)    Gen: WDWN in NAD HEENT: NCAT, sclerae white, oropharynx normal Neck: NO LAN, no JVD noted Lungs: Minimally diminished BS, no wheezes Cardiovascular: Reg, no M noted Abdomen: Soft, NT +BS Ext: no edema Neuro: CNs intact, motor/sens grossly intact   DATA: No new CXR or PFT  IMPRESSION: COPD, Gold B - very well compensated  Fatigue - appears to be unrelated to COPD.  PLAN: Cont her current maintenance medication, Symbicort, and PRN albuterol I have referred her back to her primary care physician to address the problem of fatigue ROV PRN  Merton Border, MD PCCM service Mobile [phone] Pager [phone] 07/27/2016 10:30 AM

## 2016-07-30 DIAGNOSIS — H353212 Exudative age-related macular degeneration, right eye, with inactive choroidal neovascularization: Secondary | ICD-10-CM | POA: Diagnosis not present

## 2016-08-05 DIAGNOSIS — Z0000 Encounter for general adult medical examination without abnormal findings: Secondary | ICD-10-CM | POA: Diagnosis not present

## 2016-08-05 DIAGNOSIS — Z1389 Encounter for screening for other disorder: Secondary | ICD-10-CM | POA: Diagnosis not present

## 2016-08-14 ENCOUNTER — Telehealth: Payer: Self-pay | Admitting: Pulmonary Disease

## 2016-08-14 MED ORDER — AMOXICILLIN-POT CLAVULANATE 875-125 MG PO TABS: 1.0000 | ORAL_TABLET | Freq: Two times a day (BID) | ORAL | 0 refills | Status: AC

## 2016-08-14 MED ORDER — AMOXICILLIN-POT CLAVULANATE 875-125 MG PO TABS: 1.0000 | ORAL_TABLET | Freq: Two times a day (BID) | ORAL | 0 refills | Status: DC

## 2016-08-14 NOTE — Telephone Encounter (Signed)
Pt son called, states pt has had a raw throat, fever of 101, yellow drainage. Please call.

## 2016-08-14 NOTE — Telephone Encounter (Signed)
Rx for Augmentin X 5 days entered and pt has been notified

## 2016-08-17 DIAGNOSIS — H353122 Nonexudative age-related macular degeneration, left eye, intermediate dry stage: Secondary | ICD-10-CM | POA: Diagnosis not present

## 2016-08-28 DIAGNOSIS — J209 Acute bronchitis, unspecified: Secondary | ICD-10-CM | POA: Diagnosis not present

## 2016-08-28 DIAGNOSIS — R7309 Other abnormal glucose: Secondary | ICD-10-CM | POA: Diagnosis not present

## 2016-08-31 DIAGNOSIS — H353212 Exudative age-related macular degeneration, right eye, with inactive choroidal neovascularization: Secondary | ICD-10-CM | POA: Diagnosis not present

## 2016-09-01 DIAGNOSIS — J209 Acute bronchitis, unspecified: Secondary | ICD-10-CM | POA: Diagnosis not present

## 2016-09-01 DIAGNOSIS — E119 Type 2 diabetes mellitus without complications: Secondary | ICD-10-CM | POA: Diagnosis not present

## 2016-09-01 DIAGNOSIS — R7309 Other abnormal glucose: Secondary | ICD-10-CM | POA: Diagnosis not present

## 2016-09-01 DIAGNOSIS — Z1389 Encounter for screening for other disorder: Secondary | ICD-10-CM | POA: Diagnosis not present

## 2016-09-21 DIAGNOSIS — H353212 Exudative age-related macular degeneration, right eye, with inactive choroidal neovascularization: Secondary | ICD-10-CM | POA: Diagnosis not present

## 2016-09-30 DIAGNOSIS — H353212 Exudative age-related macular degeneration, right eye, with inactive choroidal neovascularization: Secondary | ICD-10-CM | POA: Diagnosis not present

## 2016-10-22 DIAGNOSIS — H353221 Exudative age-related macular degeneration, left eye, with active choroidal neovascularization: Secondary | ICD-10-CM | POA: Diagnosis not present

## 2016-10-29 DIAGNOSIS — H353212 Exudative age-related macular degeneration, right eye, with inactive choroidal neovascularization: Secondary | ICD-10-CM | POA: Diagnosis not present

## 2016-11-18 DIAGNOSIS — Z23 Encounter for immunization: Secondary | ICD-10-CM | POA: Diagnosis not present

## 2016-11-18 DIAGNOSIS — L02413 Cutaneous abscess of right upper limb: Secondary | ICD-10-CM | POA: Diagnosis not present

## 2016-11-26 DIAGNOSIS — H2511 Age-related nuclear cataract, right eye: Secondary | ICD-10-CM | POA: Diagnosis not present

## 2016-11-26 DIAGNOSIS — H353212 Exudative age-related macular degeneration, right eye, with inactive choroidal neovascularization: Secondary | ICD-10-CM | POA: Diagnosis not present

## 2016-11-27 DIAGNOSIS — H353221 Exudative age-related macular degeneration, left eye, with active choroidal neovascularization: Secondary | ICD-10-CM | POA: Diagnosis not present

## 2016-12-28 ENCOUNTER — Other Ambulatory Visit: Payer: Self-pay | Admitting: Pulmonary Disease

## 2016-12-28 DIAGNOSIS — H353212 Exudative age-related macular degeneration, right eye, with inactive choroidal neovascularization: Secondary | ICD-10-CM | POA: Diagnosis not present

## 2017-01-01 DIAGNOSIS — H353221 Exudative age-related macular degeneration, left eye, with active choroidal neovascularization: Secondary | ICD-10-CM | POA: Diagnosis not present

## 2017-01-27 DIAGNOSIS — H353212 Exudative age-related macular degeneration, right eye, with inactive choroidal neovascularization: Secondary | ICD-10-CM | POA: Diagnosis not present

## 2017-02-10 DIAGNOSIS — H353221 Exudative age-related macular degeneration, left eye, with active choroidal neovascularization: Secondary | ICD-10-CM | POA: Diagnosis not present

## 2017-02-25 DIAGNOSIS — H353212 Exudative age-related macular degeneration, right eye, with inactive choroidal neovascularization: Secondary | ICD-10-CM | POA: Diagnosis not present

## 2017-03-17 DIAGNOSIS — H353221 Exudative age-related macular degeneration, left eye, with active choroidal neovascularization: Secondary | ICD-10-CM | POA: Diagnosis not present

## 2017-03-24 DIAGNOSIS — M7551 Bursitis of right shoulder: Secondary | ICD-10-CM | POA: Diagnosis not present

## 2017-03-29 DIAGNOSIS — H353212 Exudative age-related macular degeneration, right eye, with inactive choroidal neovascularization: Secondary | ICD-10-CM | POA: Diagnosis not present

## 2017-04-07 DIAGNOSIS — J0100 Acute maxillary sinusitis, unspecified: Secondary | ICD-10-CM | POA: Diagnosis not present

## 2017-04-07 DIAGNOSIS — R252 Cramp and spasm: Secondary | ICD-10-CM | POA: Diagnosis not present

## 2017-04-15 DIAGNOSIS — H2511 Age-related nuclear cataract, right eye: Secondary | ICD-10-CM | POA: Diagnosis not present

## 2017-04-20 ENCOUNTER — Encounter: Payer: Self-pay | Admitting: *Deleted

## 2017-04-27 ENCOUNTER — Encounter: Payer: Self-pay | Admitting: *Deleted

## 2017-04-27 ENCOUNTER — Ambulatory Visit: Payer: PPO | Admitting: Anesthesiology

## 2017-04-27 ENCOUNTER — Other Ambulatory Visit: Payer: Self-pay

## 2017-04-27 ENCOUNTER — Ambulatory Visit
Admission: RE | Admit: 2017-04-27 | Discharge: 2017-04-27 | Disposition: A | Discharge status: Home / Self Care | Payer: PPO | Source: Ambulatory Visit | Attending: Ophthalmology | Admitting: Ophthalmology

## 2017-04-27 ENCOUNTER — Encounter
Admission: RE | Disposition: A | Discharge status: Home / Self Care | Payer: Self-pay | Source: Ambulatory Visit | Attending: Ophthalmology

## 2017-04-27 DIAGNOSIS — M797 Fibromyalgia: Secondary | ICD-10-CM | POA: Diagnosis not present

## 2017-04-27 DIAGNOSIS — Z87891 Personal history of nicotine dependence: Secondary | ICD-10-CM | POA: Insufficient documentation

## 2017-04-27 DIAGNOSIS — M7989 Other specified soft tissue disorders: Secondary | ICD-10-CM | POA: Insufficient documentation

## 2017-04-27 DIAGNOSIS — E785 Hyperlipidemia, unspecified: Secondary | ICD-10-CM | POA: Diagnosis not present

## 2017-04-27 DIAGNOSIS — E119 Type 2 diabetes mellitus without complications: Secondary | ICD-10-CM | POA: Diagnosis not present

## 2017-04-27 DIAGNOSIS — M1990 Unspecified osteoarthritis, unspecified site: Secondary | ICD-10-CM | POA: Diagnosis not present

## 2017-04-27 DIAGNOSIS — M549 Dorsalgia, unspecified: Secondary | ICD-10-CM | POA: Diagnosis not present

## 2017-04-27 DIAGNOSIS — Z888 Allergy status to other drugs, medicaments and biological substances status: Secondary | ICD-10-CM | POA: Diagnosis not present

## 2017-04-27 DIAGNOSIS — E1136 Type 2 diabetes mellitus with diabetic cataract: Secondary | ICD-10-CM | POA: Diagnosis not present

## 2017-04-27 DIAGNOSIS — E7800 Pure hypercholesterolemia, unspecified: Secondary | ICD-10-CM | POA: Insufficient documentation

## 2017-04-27 DIAGNOSIS — I10 Essential (primary) hypertension: Secondary | ICD-10-CM | POA: Insufficient documentation

## 2017-04-27 DIAGNOSIS — K219 Gastro-esophageal reflux disease without esophagitis: Secondary | ICD-10-CM | POA: Insufficient documentation

## 2017-04-27 DIAGNOSIS — H9190 Unspecified hearing loss, unspecified ear: Secondary | ICD-10-CM | POA: Insufficient documentation

## 2017-04-27 DIAGNOSIS — H2511 Age-related nuclear cataract, right eye: Secondary | ICD-10-CM | POA: Insufficient documentation

## 2017-04-27 DIAGNOSIS — J449 Chronic obstructive pulmonary disease, unspecified: Secondary | ICD-10-CM | POA: Diagnosis not present

## 2017-04-27 DIAGNOSIS — Z96612 Presence of left artificial shoulder joint: Secondary | ICD-10-CM | POA: Diagnosis not present

## 2017-04-27 DIAGNOSIS — G8929 Other chronic pain: Secondary | ICD-10-CM | POA: Diagnosis not present

## 2017-04-27 DIAGNOSIS — G709 Myoneural disorder, unspecified: Secondary | ICD-10-CM | POA: Diagnosis not present

## 2017-04-27 HISTORY — PX: CATARACT EXTRACTION W/PHACO: SHX586

## 2017-04-27 SURGERY — PHACOEMULSIFICATION, CATARACT, WITH IOL INSERTION
Anesthesia: Monitor Anesthesia Care | Site: Eye | Laterality: Right | Wound class: Clean

## 2017-04-27 MED ORDER — MOXIFLOXACIN HCL 0.5 % OP SOLN: 1.0000 [drp] | Freq: Once | OPHTHALMIC | Status: DC

## 2017-04-27 MED ORDER — EPINEPHRINE PF 1 MG/ML IJ SOLN
INTRAOCULAR | Status: DC | PRN
  Administered 2017-04-27: 11:00:00 via OPHTHALMIC

## 2017-04-27 MED ORDER — MOXIFLOXACIN HCL 0.5 % OP SOLN
OPHTHALMIC | Status: AC
  Filled 2017-04-27: qty 3

## 2017-04-27 MED ORDER — MOXIFLOXACIN HCL 0.5 % OP SOLN
OPHTHALMIC | Status: DC | PRN
  Administered 2017-04-27: 0.2 mL via OPHTHALMIC

## 2017-04-27 MED ORDER — POVIDONE-IODINE 5 % OP SOLN
OPHTHALMIC | Status: DC | PRN
  Administered 2017-04-27: 1 via OPHTHALMIC

## 2017-04-27 MED ORDER — LIDOCAINE HCL (PF) 4 % IJ SOLN
INTRAMUSCULAR | Status: AC
  Filled 2017-04-27: qty 5

## 2017-04-27 MED ORDER — EPINEPHRINE PF 1 MG/ML IJ SOLN
INTRAMUSCULAR | Status: AC
  Filled 2017-04-27: qty 2

## 2017-04-27 MED ORDER — FENTANYL CITRATE (PF) 100 MCG/2ML IJ SOLN
INTRAMUSCULAR | Status: AC
  Filled 2017-04-27: qty 2

## 2017-04-27 MED ORDER — NA CHONDROIT SULF-NA HYALURON 40-17 MG/ML IO SOLN
INTRAOCULAR | Status: AC
  Filled 2017-04-27: qty 1

## 2017-04-27 MED ORDER — CARBACHOL 0.01 % IO SOLN
INTRAOCULAR | Status: DC | PRN
  Administered 2017-04-27: 0.5 mL via INTRAOCULAR

## 2017-04-27 MED ORDER — SODIUM CHLORIDE 0.9 % IV SOLN
INTRAVENOUS | Status: DC
Start: 2017-04-27 — End: 2017-04-27
  Administered 2017-04-27: 10:00:00 via INTRAVENOUS

## 2017-04-27 MED ORDER — ARMC OPHTHALMIC DILATING DROPS
OPHTHALMIC | Status: AC
  Administered 2017-04-27: 1 via OPHTHALMIC
  Filled 2017-04-27: qty 0.4

## 2017-04-27 MED ORDER — POVIDONE-IODINE 5 % OP SOLN
OPHTHALMIC | Status: AC
  Filled 2017-04-27: qty 30

## 2017-04-27 MED ORDER — LIDOCAINE HCL (PF) 4 % IJ SOLN
INTRAOCULAR | Status: DC | PRN
  Administered 2017-04-27: 4 mL via OPHTHALMIC

## 2017-04-27 MED ORDER — NA CHONDROIT SULF-NA HYALURON 40-17 MG/ML IO SOLN
INTRAOCULAR | Status: DC | PRN
  Administered 2017-04-27: 1 mL via INTRAOCULAR

## 2017-04-27 MED ORDER — FENTANYL CITRATE (PF) 100 MCG/2ML IJ SOLN
INTRAMUSCULAR | Status: DC | PRN
  Administered 2017-04-27: 50 ug via INTRAVENOUS

## 2017-04-27 MED ORDER — ARMC OPHTHALMIC DILATING DROPS
1.0000 "application " | OPHTHALMIC | Status: AC
  Administered 2017-04-27 (×3): 1 via OPHTHALMIC

## 2017-04-27 SURGICAL SUPPLY — 18 items
GLOVE BIO SURGEON STRL SZ8 (GLOVE) ×3 IMPLANT
GLOVE BIOGEL M 6.5 STRL (GLOVE) ×3 IMPLANT
GLOVE SURG LX 8.0 MICRO (GLOVE) ×2
GLOVE SURG LX STRL 8.0 MICRO (GLOVE) ×1 IMPLANT
GOWN STRL REUS W/ TWL LRG LVL3 (GOWN DISPOSABLE) ×2 IMPLANT
GOWN STRL REUS W/TWL LRG LVL3 (GOWN DISPOSABLE) ×6
LABEL CATARACT MEDS ST (LABEL) ×3 IMPLANT
LENS IOL ACRYSOF IQ 21.5 (Intraocular Lens) ×2 IMPLANT
LENS IOL TECNIS ITEC 21.5 (Intraocular Lens) IMPLANT
PACK CATARACT (MISCELLANEOUS) ×3 IMPLANT
PACK CATARACT BRASINGTON LX (MISCELLANEOUS) ×3 IMPLANT
PACK EYE AFTER SURG (MISCELLANEOUS) ×3 IMPLANT
SOL BSS BAG (MISCELLANEOUS) ×3
SOLUTION BSS BAG (MISCELLANEOUS) ×1 IMPLANT
SUT ETHILON 10 0 CS140 6 (SUTURE) ×2 IMPLANT
SYR 5ML LL (SYRINGE) ×3 IMPLANT
WATER STERILE IRR 250ML POUR (IV SOLUTION) ×3 IMPLANT
WIPE NON LINTING 3.25X3.25 (MISCELLANEOUS) ×3 IMPLANT

## 2017-04-27 NOTE — Transfer of Care (Signed)
Immediate Anesthesia Transfer of Care Note  Patient: Kristin Estrada  Procedure(s) Performed: CATARACT EXTRACTION PHACO AND INTRAOCULAR LENS PLACEMENT (IOC) (Right Eye)  Patient Location: PACU and Short Stay  Anesthesia Type:MAC  Level of Consciousness: awake, alert  and oriented  Airway & Oxygen Therapy: Patient Spontanous Breathing  Post-op Assessment: Report given to RN and Post -op Vital signs reviewed and stable  Post vital signs: Reviewed and stable  Last Vitals:  Vitals:   04/27/17 0928  BP: (!) 113/92  Pulse: (!) 101  Resp: 16  Temp: 36.5 C  SpO2: 94%    Last Pain:  Vitals:   04/27/17 0928  TempSrc: Temporal         Complications: No apparent anesthesia complications

## 2017-04-27 NOTE — Anesthesia Post-op Follow-up Note (Signed)
Anesthesia QCDR form completed.        

## 2017-04-27 NOTE — Op Note (Signed)
PREOPERATIVE DIAGNOSIS:  Nuclear sclerotic cataract of the right eye.   POSTOPERATIVE DIAGNOSIS:  NUCLEAR SCLEROTIC CATARACT RIGHT EYE   OPERATIVE PROCEDURE: Procedure(s): CATARACT EXTRACTION PHACO AND INTRAOCULAR LENS PLACEMENT (IOC)   SURGEON:  Birder Robson, MD.   ANESTHESIA:  Anesthesiologist: Piscitello, Precious Haws, MD CRNA: Dionne Bucy, CRNA; Hedda Slade, CRNA  1.      Managed anesthesia care. 2.      0.69ml of Shugarcaine was instilled in the eye following the paracentesis.   COMPLICATIONS:  None.   TECHNIQUE:   Stop and chop   DESCRIPTION OF PROCEDURE:  The patient was examined and consented in the preoperative holding area where the aforementioned topical anesthesia was applied to the right eye and then brought back to the Operating Room where the right eye was prepped and draped in the usual sterile ophthalmic fashion and a lid speculum was placed. A paracentesis was created with the side port blade and the anterior chamber was filled with viscoelastic. A near clear corneal incision was performed with the steel keratome. A continuous curvilinear capsulorrhexis was performed with a cystotome followed by the capsulorrhexis forceps. Hydrodissection and hydrodelineation were carried out with BSS on a blunt cannula. The lens was removed in a stop and chop  technique and the remaining cortical material was removed with the irrigation-aspiration handpiece. The capsular bag was inflated with viscoelastic and the Technis [id number]  lens was placed in the capsular bag without complication. The remaining viscoelastic was removed from the eye with the irrigation-aspiration handpiece. The wounds were hydrated. The anterior chamber was flushed with Miostat and the eye was inflated to physiologic pressure. 0.66ml of Vigamox was placed in the anterior chamber. The wounds were found to be water tight. The eye was dressed with Vigamox. The patient was given protective glasses to wear throughout the day  and a shield with which to sleep tonight. The patient was also given drops with which to begin a drop regimen today and will follow-up with me in one day. Implant Name Type Inv. Item Serial No. Manufacturer Lot No. LRB No. Used  LENS IOL DIOP 21.5 - [id number] Intraocular Lens LENS IOL DIOP 21.5 [id number] [employer]O  Right 1  LENS IOL ACRYSOF IQ 21.5 - [id number] Intraocular Lens LENS IOL ACRYSOF IQ 21.5 [id number] [employer]  Right 1   Procedure(s) with comments: CATARACT EXTRACTION PHACO AND INTRAOCULAR LENS PLACEMENT (IOC) (Right) - [place] 00:46.3 AP% 12.7 CDE 5.86 Fluid Pack lot # [id number]  Electronically signed: Birder Robson 04/27/2017 11:33 AM

## 2017-04-27 NOTE — Anesthesia Preprocedure Evaluation (Signed)
Anesthesia Evaluation  Patient identified by MRN, date of birth, ID band Patient awake    Reviewed: Allergy & Precautions, NPO status , Patient's Chart, lab work & pertinent test results  History of Anesthesia Complications Negative for: history of anesthetic complications  Airway Mallampati: III  TM Distance: <3 FB Neck ROM: limited    Dental  (+) Upper Dentures, Lower Dentures, Poor Dentition   Pulmonary shortness of breath, COPD, former smoker,     + decreased breath sounds      Cardiovascular Exercise Tolerance: Poor hypertension, Pt. on medications  Rhythm:Regular Rate:Normal     Neuro/Psych  Headaches,  Neuromuscular disease    GI/Hepatic Neg liver ROS, GERD  ,  Endo/Other  diabetes, Type 2  Renal/GU negative Renal ROS     Musculoskeletal  (+) Arthritis , Fibromyalgia -  Abdominal (+) + obese,   Peds  Hematology   Anesthesia Other Findings Past Medical History: No date: Arthritis No date: Bronchitis No date: Bruises easily No date: Cataracts, bilateral No date: Chronic airway obstruction, not elsewhere classified     Comment:  Symbicort daily and Albuterol daily as needed No date: Chronic back pain     Comment:  arthritis No date: Chronic bronchitis (HCC) No date: Diabetes mellitus without complication (HCC)     Comment:  borderline-diet and exercise;takes Cinnamon daily No date: Esophageal reflux     Comment:  takes Nexium daily and Dexilant daily as needed No date: Fibromyalgia No date: Headache     Comment:  occasionally No date: HOH (hard of hearing) No date: Hyperlipidemia     Comment:  takes CO Q10 daily No date: Insomnia     Comment:  takes Ambien and Trazodone nightly No date: Joint pain No date: Joint swelling No date: Macular degeneration, right eye     Comment:  wet and to get injection tomorrow No date: Pneumonia     Comment:  hx of x 3 with most recent one 2009 No date: Seasonal  allergies     Comment:  takes Zyrtec daily as well as using Nasonex No date: Shortness of breath dyspnea No date: Shoulder fracture No date: Unspecified essential hypertension     Comment:  takes Amlodipine and Losartan daily No date: URI (upper respiratory infection)     Comment:  was on Prednisone and Antibiotic-to complete today No date: Urinary urgency   Reproductive/Obstetrics                             Anesthesia Physical  Anesthesia Plan  ASA: III  Anesthesia Plan: MAC   Post-op Pain Management:    Induction: Intravenous  PONV Risk Score and Plan:   Airway Management Planned: Nasal Cannula  Additional Equipment:   Intra-op Plan:   Post-operative Plan:   Informed Consent: I have reviewed the patients History and Physical, chart, labs and discussed the procedure including the risks, benefits and alternatives for the proposed anesthesia with the patient or authorized representative who has indicated his/her understanding and acceptance.   Dental Advisory Given  Plan Discussed with: CRNA  Anesthesia Plan Comments: (Patient consented for risks of anesthesia including but not limited to:  - adverse reactions to medications - damage to teeth, lips or other oral mucosa - sore throat or hoarseness - Damage to heart, brain, lungs or loss of life  Patient voiced understanding.)        Anesthesia Quick Evaluation

## 2017-04-27 NOTE — Discharge Instructions (Signed)
Eye Surgery Discharge Instructions  Expect mild scratchy sensation or mild soreness. DO NOT RUB YOUR EYE!  The day of surgery:  Minimal physical activity, but bed rest is not required  No reading, computer work, or close hand work  No bending, lifting, or straining.  May watch TV  For 24 hours:  No driving, legal decisions, or alcoholic beverages  Safety precautions  Eat anything you prefer: It is better to start with liquids, then soup then solid foods.  _____ Eye patch should be worn until postoperative exam tomorrow.  ____ Solar shield eyeglasses should be worn for comfort in the sunlight/patch while sleeping  Resume all regular medications including aspirin or Coumadin if these were discontinued prior to surgery. You may shower, bathe, shave, or wash your hair. Tylenol may be taken for mild discomfort.  Call your doctor if you experience significant pain, nausea, or vomiting, fever > 101 or other signs of infection. [phone] or [phone] Specific instructions:  Follow-up Information    Birder Robson, MD Follow up.   Specialty:  Ophthalmology Why:  March 13 at 1:15pm Contact information: [address] [place]  [address] [phone]

## 2017-04-27 NOTE — Anesthesia Postprocedure Evaluation (Signed)
Anesthesia Post Note  Patient: Kristin Estrada  Procedure(s) Performed: CATARACT EXTRACTION PHACO AND INTRAOCULAR LENS PLACEMENT (IOC) (Right Eye)  Patient location during evaluation: Short Stay Anesthesia Type: MAC Level of consciousness: awake, awake and alert and oriented Pain management: pain level controlled Vital Signs Assessment: post-procedure vital signs reviewed and stable Respiratory status: spontaneous breathing and respiratory function stable Cardiovascular status: blood pressure returned to baseline and stable Postop Assessment: no apparent nausea or vomiting Anesthetic complications: no     Last Vitals:  Vitals:   04/27/17 0928  BP: (!) 113/92  Pulse: (!) 101  Resp: 16  Temp: 36.5 C  SpO2: 94%    Last Pain:  Vitals:   04/27/17 0928  TempSrc: Temporal                 Nadene Rubins

## 2017-04-28 DIAGNOSIS — H353221 Exudative age-related macular degeneration, left eye, with active choroidal neovascularization: Secondary | ICD-10-CM | POA: Diagnosis not present

## 2017-04-28 NOTE — H&P (Signed)
All labs reviewed. Abnormal studies sent to patients PCP when indicated.  Previous H&P reviewed, patient examined, there are NO CHANGES.  Kristin Estrada Porfilio3/13/201912:18 PM

## 2017-05-03 DIAGNOSIS — H353212 Exudative age-related macular degeneration, right eye, with inactive choroidal neovascularization: Secondary | ICD-10-CM | POA: Diagnosis not present

## 2017-05-31 DIAGNOSIS — H353211 Exudative age-related macular degeneration, right eye, with active choroidal neovascularization: Secondary | ICD-10-CM | POA: Diagnosis not present

## 2017-06-01 DIAGNOSIS — J441 Chronic obstructive pulmonary disease with (acute) exacerbation: Secondary | ICD-10-CM | POA: Diagnosis not present

## 2017-06-01 DIAGNOSIS — E119 Type 2 diabetes mellitus without complications: Secondary | ICD-10-CM | POA: Diagnosis not present

## 2017-06-01 DIAGNOSIS — I10 Essential (primary) hypertension: Secondary | ICD-10-CM | POA: Diagnosis not present

## 2017-06-02 DIAGNOSIS — H353211 Exudative age-related macular degeneration, right eye, with active choroidal neovascularization: Secondary | ICD-10-CM | POA: Diagnosis not present

## 2017-06-09 DIAGNOSIS — G4700 Insomnia, unspecified: Secondary | ICD-10-CM | POA: Diagnosis not present

## 2017-06-09 DIAGNOSIS — J449 Chronic obstructive pulmonary disease, unspecified: Secondary | ICD-10-CM | POA: Diagnosis not present

## 2017-06-09 DIAGNOSIS — E559 Vitamin D deficiency, unspecified: Secondary | ICD-10-CM | POA: Diagnosis not present

## 2017-06-09 DIAGNOSIS — J441 Chronic obstructive pulmonary disease with (acute) exacerbation: Secondary | ICD-10-CM | POA: Diagnosis not present

## 2017-06-09 DIAGNOSIS — E119 Type 2 diabetes mellitus without complications: Secondary | ICD-10-CM | POA: Diagnosis not present

## 2017-06-09 DIAGNOSIS — J42 Unspecified chronic bronchitis: Secondary | ICD-10-CM | POA: Diagnosis not present

## 2017-06-09 DIAGNOSIS — I10 Essential (primary) hypertension: Secondary | ICD-10-CM | POA: Diagnosis not present

## 2017-06-09 DIAGNOSIS — J019 Acute sinusitis, unspecified: Secondary | ICD-10-CM | POA: Diagnosis not present

## 2017-06-09 DIAGNOSIS — E785 Hyperlipidemia, unspecified: Secondary | ICD-10-CM | POA: Diagnosis not present

## 2017-06-30 ENCOUNTER — Other Ambulatory Visit: Payer: Self-pay | Admitting: Family Medicine

## 2017-06-30 DIAGNOSIS — J019 Acute sinusitis, unspecified: Secondary | ICD-10-CM | POA: Diagnosis not present

## 2017-06-30 DIAGNOSIS — N649 Disorder of breast, unspecified: Secondary | ICD-10-CM

## 2017-06-30 DIAGNOSIS — E785 Hyperlipidemia, unspecified: Secondary | ICD-10-CM | POA: Diagnosis not present

## 2017-06-30 DIAGNOSIS — B372 Candidiasis of skin and nail: Secondary | ICD-10-CM | POA: Diagnosis not present

## 2017-06-30 DIAGNOSIS — I10 Essential (primary) hypertension: Secondary | ICD-10-CM | POA: Diagnosis not present

## 2017-06-30 DIAGNOSIS — R946 Abnormal results of thyroid function studies: Secondary | ICD-10-CM | POA: Diagnosis not present

## 2017-06-30 DIAGNOSIS — E119 Type 2 diabetes mellitus without complications: Secondary | ICD-10-CM | POA: Diagnosis not present

## 2017-07-02 DIAGNOSIS — H353212 Exudative age-related macular degeneration, right eye, with inactive choroidal neovascularization: Secondary | ICD-10-CM | POA: Diagnosis not present

## 2017-07-06 DIAGNOSIS — H353221 Exudative age-related macular degeneration, left eye, with active choroidal neovascularization: Secondary | ICD-10-CM | POA: Diagnosis not present

## 2017-07-09 ENCOUNTER — Ambulatory Visit
Admission: RE | Admit: 2017-07-09 | Discharge: 2017-07-09 | Disposition: A | Discharge status: Home / Self Care | Payer: PPO | Source: Ambulatory Visit | Attending: Family Medicine | Admitting: Family Medicine

## 2017-07-09 DIAGNOSIS — N649 Disorder of breast, unspecified: Secondary | ICD-10-CM | POA: Insufficient documentation

## 2017-07-09 DIAGNOSIS — N6323 Unspecified lump in the left breast, lower outer quadrant: Secondary | ICD-10-CM | POA: Diagnosis not present

## 2017-07-09 DIAGNOSIS — R928 Other abnormal and inconclusive findings on diagnostic imaging of breast: Secondary | ICD-10-CM | POA: Diagnosis not present

## 2017-07-13 ENCOUNTER — Other Ambulatory Visit: Payer: Self-pay | Admitting: Family Medicine

## 2017-07-13 DIAGNOSIS — R928 Other abnormal and inconclusive findings on diagnostic imaging of breast: Secondary | ICD-10-CM

## 2017-07-13 DIAGNOSIS — N6320 Unspecified lump in the left breast, unspecified quadrant: Secondary | ICD-10-CM

## 2017-07-21 ENCOUNTER — Ambulatory Visit
Admission: RE | Admit: 2017-07-21 | Discharge: 2017-07-21 | Disposition: A | Discharge status: Home / Self Care | Payer: PPO | Source: Ambulatory Visit | Attending: Family Medicine | Admitting: Family Medicine

## 2017-07-21 DIAGNOSIS — C50512 Malignant neoplasm of lower-outer quadrant of left female breast: Secondary | ICD-10-CM | POA: Diagnosis not present

## 2017-07-21 DIAGNOSIS — R928 Other abnormal and inconclusive findings on diagnostic imaging of breast: Secondary | ICD-10-CM | POA: Insufficient documentation

## 2017-07-21 DIAGNOSIS — N6320 Unspecified lump in the left breast, unspecified quadrant: Secondary | ICD-10-CM | POA: Insufficient documentation

## 2017-07-21 DIAGNOSIS — N6323 Unspecified lump in the left breast, lower outer quadrant: Secondary | ICD-10-CM | POA: Diagnosis not present

## 2017-07-21 HISTORY — PX: MASTECTOMY: SHX3

## 2017-07-22 ENCOUNTER — Other Ambulatory Visit: Payer: Self-pay | Admitting: Anatomic Pathology & Clinical Pathology

## 2017-07-23 ENCOUNTER — Other Ambulatory Visit: Payer: Self-pay

## 2017-07-23 DIAGNOSIS — C50912 Malignant neoplasm of unspecified site of left female breast: Secondary | ICD-10-CM

## 2017-07-23 NOTE — Progress Notes (Signed)
  Oncology Nurse Navigator Documentation  Navigator Location: [facility]c (07/23/17 1500) Referral date to RadOnc/MedOnc: 07/27/17 (07/23/17 1500) )Navigator Encounter Type: Introductory phone call (07/23/17 1500)   Abnormal Finding Date: 07/09/17 (07/23/17 1500) Confirmed Diagnosis Date: 07/21/17 (07/23/17 1500)               Patient Visit Type: Initial (07/23/17 1500) Treatment Phase: Pre-Tx/Tx Discussion (07/23/17 1500) Barriers/Navigation Needs: Coordination of Care;Education (07/23/17 1500) Education: Accessing Care/ Finding Providers;Coping with Diagnosis/ Prognosis;Newly Diagnosed Cancer Education (07/23/17 1500) Interventions: Coordination of Care;Education (07/23/17 1500)   Coordination of Care: Appts (07/23/17 1500) Education Method: Verbal;Teach-back (07/23/17 1500)                Time Spent with Patient: 60 (07/23/17 1500)    Introduced patient navigation.  Scheduled patient for Med/Onc consult with Dr. Grayland Ormond on 07/27/17 at 11:00 per patient request. Plan to meet patient at that visit for support ,and to give Breast Cancer Treatment Handbook/folder with hospital services.   Information fo case conference collected.

## 2017-07-24 DIAGNOSIS — C50512 Malignant neoplasm of lower-outer quadrant of left female breast: Secondary | ICD-10-CM | POA: Insufficient documentation

## 2017-07-24 NOTE — Progress Notes (Signed)
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  INTERVAL HISTORY: Patient is a 77 year old female who noted discomfort in her left breast several months ago.  More recently, she noted skin changes.  This led to mammogram and biopsy was revealed the above stated breast cancer with noted skin ulceration.  Patient is anxious, but otherwise feels well.  She has breast tenderness at the site of her biopsy.  She has no neurologic complaints.  She denies any recent fevers or illnesses.  She has good appetite and denies weight loss.  She denies any pain.  She has no chest pain or shortness of breath.  She denies any nausea, vomiting, constipation, or diarrhea.  She has no urinary complaints.  Patient otherwise feels well and offers no further specific complaints.  REVIEW OF SYSTEMS:   Review of Systems  Constitutional: Negative.  Negative for fever, malaise/fatigue and weight loss.  Respiratory: Negative.  Negative for cough and shortness of breath.   Cardiovascular: Negative.  Negative for chest pain and leg swelling.  Gastrointestinal: Negative.  Negative for abdominal pain.  Genitourinary: Negative.  Negative for dysuria.  Musculoskeletal: Negative.  Negative for back pain.  Skin: Negative.  Negative for rash.  Neurological: Negative.  Negative for sensory change, focal weakness and weakness.  Psychiatric/Behavioral: Negative.  The patient is not nervous/anxious.     As per HPI. Otherwise, a complete review of systems is negative.  PAST MEDICAL HISTORY: Past Medical History:    Diagnosis Date  . Arthritis   . Bronchitis   . Bruises easily   . Cataracts, bilateral   . Chronic airway obstruction, not elsewhere classified    Symbicort daily and Albuterol daily as needed  . Chronic back pain    arthritis  . Chronic bronchitis ([place])   . Diabetes mellitus without complication ([place])    borderline-diet and exercise;takes Cinnamon daily  . Esophageal reflux    takes Nexium daily and Dexilant daily as needed  . Fibromyalgia   . Headache    occasionally  . HOH (hard of hearing)   . Hyperlipidemia    takes CO Q10 daily  . Insomnia    takes Ambien and Trazodone nightly  . Joint pain   . Joint swelling   . Macular degeneration, right eye    wet and to get injection tomorrow  . Pneumonia    hx of x 3 with most recent one 2009  . Seasonal allergies    takes Zyrtec daily as well as using Nasonex  . Shortness of breath dyspnea   . Shoulder fracture   . Unspecified essential hypertension    takes Amlodipine and Losartan daily  . URI (upper respiratory infection)    was on Prednisone and Antibiotic-to complete today  . Urinary urgency     PAST SURGICAL HISTORY: Past Surgical History:  Procedure Laterality Date  . BREAST SURGERY  1972   nodules removed and benign  . CATARACT EXTRACTION W/PHACO Left 12/31/2014   Procedure: CATARACT EXTRACTION PHACO AND INTRAOCULAR LENS PLACEMENT (IOC);  Surgeon: Estill Cotta, MD;  Location: [facility];  Service: Ophthalmology;  Laterality: Left;  [place] 01:00 AP%  24.0 CDE 25.53 fluid pack # [id number]  . CATARACT EXTRACTION W/PHACO Right 04/27/2017   Procedure: CATARACT EXTRACTION PHACO AND INTRAOCULAR LENS PLACEMENT (IOC);  Surgeon: Birder Robson, MD;  Location: [facility];  Service: Ophthalmology;  Laterality: Right;  [place] 00:46.3 AP% 12.7 CDE 5.86 Fluid Pack lot # [id number]  . JOINT REPLACEMENT     SHOULDER  . REVERSE SHOULDER ARTHROPLASTY Left 04/19/2014   Procedure:  REVERSE TOTAL SHOULDER ARTHROPLASTY;  Surgeon: Marin Shutter, MD;  Location: [place];  Service: Orthopedics;  Laterality: Left;    FAMILY HISTORY: Family History  Problem Relation Age of Onset  . Heart disease Unknown   . Breast cancer Neg Hx     ADVANCED DIRECTIVES (Y/N):  N  HEALTH MAINTENANCE: Social History   Tobacco Use  . Smoking status: Former Smoker    Packs/day: 2.00    Years: 30.00    Pack years: 60.00    Types: Cigarettes    Last attempt to quit: 02/17/2004    Years since quitting: 13.4  . Smokeless tobacco: Never Used  . Tobacco comment: quit smoking in 2006  Substance Use Topics  . Alcohol use: No    Alcohol/week: 0.0 oz  . Drug use: No     Colonoscopy:  PAP:  Bone density:  Lipid panel:  Allergies  Allergen Reactions  . Protonix [Pantoprazole Sodium] Other (See Comments)    abd cramps  . Prednisone Other (See Comments)    Agitation, insomina, mood change. Tolerates injection- not PO  . Statins Other (See Comments)    Legs hurt    Current Outpatient Medications  Medication Sig Dispense Refill  . acetaminophen (TYLENOL) 500 MG tablet Take 500 mg by mouth every 6 (six) hours as needed for moderate pain or headache.     . albuterol (PROAIR HFA) 108 (90 BASE) MCG/ACT inhaler Inhale 2 puffs into the lungs every 4 (four) hours as needed. (Patient taking differently: Inhale 2 puffs into the lungs every 4 (four) hours as needed for wheezing or shortness of breath. ) 1 Inhaler 3  . albuterol (PROVENTIL) (2.5 MG/3ML) 0.083% nebulizer solution Take 2.5 mg by nebulization 2 (two) times daily as needed for wheezing or shortness of breath.     Marland Kitchen amLODipine (NORVASC) 5 MG tablet Take 5 mg by mouth daily.    . Bevacizumab (AVASTIN IV) Place 1 Dose into both eyes every 30 (thirty) days.     . cetirizine (ZYRTEC) 10 MG tablet Take 10 mg by mouth at bedtime.     . cholecalciferol (VITAMIN D) 1000 UNITS tablet Take 1,000 Units by mouth daily.    . Coenzyme Q10 (CO Q 10) 10 MG CAPS Take 10 mg by mouth daily.     Marland Kitchen  dextromethorphan-guaiFENesin (MUCINEX DM) 30-600 MG per 12 hr tablet Take 1 tablet by mouth every 12 (twelve) hours as needed for cough.     . diazepam (VALIUM) 5 MG tablet Take 0.5-1 tablets (2.5-5 mg total) by mouth every 6 (six) hours as needed for muscle spasms or sedation. (Patient taking differently: Take 5 mg by mouth at bedtime. ) 40 tablet 1  . diclofenac (VOLTAREN) 75 MG EC tablet Take 75 mg by mouth 2 (two) times daily.      . DULoxetine (CYMBALTA) 60 MG capsule Take 60 mg by mouth daily as needed (for pain).     Marland Kitchen esomeprazole (NEXIUM) 40 MG capsule Take 40 mg by mouth daily as needed (for acid reflux).     Marland Kitchen  famotidine (PEPCID) 20 MG tablet Take 1 tablet (20 mg total) by mouth 2 (two) times daily. When coughing (Patient taking differently: Take 20 mg by mouth 2 (two) times daily as needed (for coughing). ) 60 tablet 6  . Homeopathic Products ([employer] LEG CRAMPS PAIN RELIEF PO) Take 2 tablets by mouth every 4 (four) hours as needed (for leg cramps).     Marland Kitchen ipratropium (ATROVENT) 0.02 % nebulizer solution Take 0.5 mg by nebulization 2 (two) times daily as needed for wheezing or shortness of breath.     . levothyroxine (SYNTHROID, LEVOTHROID) 25 MCG tablet Take 25 mcg by mouth daily before breakfast.    . losartan (COZAAR) 100 MG tablet Take 100 mg by mouth daily.     . Magnesium 400 MG CAPS Take 400 mg by mouth See admin instructions. Take 400 mg by mouth in the morning every day and take 400 mg by mouth at bedtime 3 times weekly    . metoprolol succinate (TOPROL-XL) 25 MG 24 hr tablet Take 25 mg by mouth daily.    . mometasone (NASONEX) 50 MCG/ACT nasal spray Place 1 spray into the nose daily. 17 g 11  . Multiple Vitamins-Minerals (ICAPS PO) Take 1 capsule by mouth daily.    Marland Kitchen nystatin (MYCOSTATIN) 100000 UNIT/ML suspension Take 5 mLs (500,000 Units total) by mouth as needed. (Patient taking differently: Take 5 mLs by mouth daily as needed (for mouth due to inhaler). ) 60 mL 5  . Polyethyl  Glycol-Propyl Glycol (SYSTANE OP) Place 1 drop into both eyes 3 (three) times daily as needed (for dry eyes).    . SYMBICORT 160-4.5 MCG/ACT inhaler Inhale 2 puffs into the lungs 2 (two) times daily. 30.6 Inhaler 2  . vitamin C (ASCORBIC ACID) 500 MG tablet Take 500 mg by mouth daily.    . clotrimazole (LOTRIMIN) 1 % cream Apply 1 application topically 2 (two) times daily as needed (for yeast infection).     No current facility-administered medications for this visit.     OBJECTIVE: Vitals:   07/27/17 1122 07/27/17 1131  BP:  (!) 172/83  Pulse:  76  Resp: 20   Temp:  98 F (36.7 C)     Body mass index is 32.19 kg/m.    ECOG FS:0 - Asymptomatic  General: Well-developed, well-nourished, no acute distress. Eyes: Pink conjunctiva, anicteric sclera. Breast: Easily palpable mass left breast with notable skin ulceration and ecchymosis. Lungs: Clear to auscultation bilaterally. Heart: Regular rate and rhythm. No rubs, murmurs, or gallops. Abdomen: Soft, nontender, nondistended. No organomegaly noted, normoactive bowel sounds. Musculoskeletal: No edema, cyanosis, or clubbing. Neuro: Alert, answering all questions appropriately. Cranial nerves grossly intact. Skin: No rashes or petechiae noted. Psych: Normal affect.   LAB RESULTS:  Lab Results  Component Value Date   NA 140 04/10/2014   K 4.1 04/10/2014   CL 104 04/10/2014   CO2 25 04/10/2014   GLUCOSE 107 (H) 04/10/2014   BUN 17 04/10/2014   CREATININE 0.61 04/10/2014   CALCIUM 9.0 04/10/2014   GFRNONAA 87 (L) 04/10/2014   GFRAA >90 04/10/2014    Lab Results  Component Value Date   WBC 10.0 04/10/2014   HGB 14.4 04/10/2014   HCT 43.4 04/10/2014   MCV 87.1 04/10/2014   PLT 353 04/10/2014     STUDIES: US Breast Ltd Uni Left Inc Axilla  Result Date: 07/09/2017 CLINICAL DATA:  Left lower central breast area of palpable concern felt by the patient and her clinician. Skin ulceration  over the palpable mass is also present.  EXAM: DIGITAL DIAGNOSTIC BILATERAL MAMMOGRAM WITH CAD AND TOMO ULTRASOUND LEFT BREAST COMPARISON:  Previous exam(s). ACR Breast Density Category b: There are scattered areas of fibroglandular density. FINDINGS: Mammographically, there are no suspicious masses, architectural distortion or suspicious clustered calcifications in the right breast. Postsurgical changes from prior benign excisional biopsy are noted. In the left lower central breast there is a hyperdense spiculated mass with associated microcalcifications, which corresponds to the area of palpable concern described by the patient. The mass measures 4.0 x 3.0 cm and causes retraction of the nipple, and shrunken appearance of the left breast. Mammographic images were processed with CAD. On physical exam, there is approximately 2-1/2 cm ulceration of the skin at 6 o'clock with firm fix associated mass and retraction of the left nipple. Targeted ultrasound is performed, showing left breast 5 o'clock 3 cm from the nipple irregular hypoechoic mass, which extends to the skin and measures 2.2 by 3.3 by 2.8 cm. Internal hypervascularity is noted. There is no evidence of left axillary lymphadenopathy. IMPRESSION: Highly suspicious left breast 5 o'clock mass which measures 3.3 cm in greatest dimensions sonographically, and 4 cm in greatest dimension mammographically, with associated skin ulceration, and retraction of the left nipple. No evidence of left axillary lymphadenopathy. RECOMMENDATION: Ultrasound-guided core needle biopsy of left breast 5 o'clock mass. I have discussed the findings and recommendations with the patient. Results were also provided in writing at the conclusion of the visit. If applicable, a reminder letter will be sent to the patient regarding the next appointment. BI-RADS CATEGORY  5: Highly suggestive of malignancy. Electronically Signed   By: Fidela Salisbury M.D.   On: 07/09/2017 15:30   Mm Diag Breast Tomo Bilateral  Result Date:  07/09/2017 CLINICAL DATA:  Left lower central breast area of palpable concern felt by the patient and her clinician. Skin ulceration over the palpable mass is also present. EXAM: DIGITAL DIAGNOSTIC BILATERAL MAMMOGRAM WITH CAD AND TOMO ULTRASOUND LEFT BREAST COMPARISON:  Previous exam(s). ACR Breast Density Category b: There are scattered areas of fibroglandular density. FINDINGS: Mammographically, there are no suspicious masses, architectural distortion or suspicious clustered calcifications in the right breast. Postsurgical changes from prior benign excisional biopsy are noted. In the left lower central breast there is a hyperdense spiculated mass with associated microcalcifications, which corresponds to the area of palpable concern described by the patient. The mass measures 4.0 x 3.0 cm and causes retraction of the nipple, and shrunken appearance of the left breast. Mammographic images were processed with CAD. On physical exam, there is approximately 2-1/2 cm ulceration of the skin at 6 o'clock with firm fix associated mass and retraction of the left nipple. Targeted ultrasound is performed, showing left breast 5 o'clock 3 cm from the nipple irregular hypoechoic mass, which extends to the skin and measures 2.2 by 3.3 by 2.8 cm. Internal hypervascularity is noted. There is no evidence of left axillary lymphadenopathy. IMPRESSION: Highly suspicious left breast 5 o'clock mass which measures 3.3 cm in greatest dimensions sonographically, and 4 cm in greatest dimension mammographically, with associated skin ulceration, and retraction of the left nipple. No evidence of left axillary lymphadenopathy. RECOMMENDATION: Ultrasound-guided core needle biopsy of left breast 5 o'clock mass. I have discussed the findings and recommendations with the patient. Results were also provided in writing at the conclusion of the visit. If applicable, a reminder letter will be sent to the patient regarding the next appointment. BI-RADS  CATEGORY  5: Highly suggestive  of malignancy. Electronically Signed   By: Fidela Salisbury M.D.   On: 07/09/2017 15:30   Mm Clip Placement Left  Result Date: 07/21/2017 CLINICAL DATA:  Status post ultrasound-guided core needle biopsy of the recently demonstrated 3.3 cm mass in the 5 o'clock position of the left breast. EXAM: DIAGNOSTIC LEFT MAMMOGRAM POST ULTRASOUND BIOPSY COMPARISON:  Previous exam(s). FINDINGS: Mammographic images were obtained following ultrasound guided biopsy of the recently demonstrated 3.3 cm mass in the 5 o'clock position of the left breast. These demonstrate a heart shaped biopsy marker clip within the biopsied mass. IMPRESSION: Appropriate clip deployment following left breast ultrasound-guided core needle biopsy. Final Assessment: Post Procedure Mammograms for Marker Placement Electronically Signed   By: Claudie Revering M.D.   On: 07/21/2017 12:15   [place] Lt Breast Bx W Loc Dev 1st Lesion Img Bx Spec US Guide  Addendum Date: 07/22/2017   ADDENDUM REPORT: 07/22/2017 13:24 ADDENDUM: Pathology results: Pathology results from the ultrasound-guided biopsy of the mass in the left breast at the 5 o'clock position revealed invasive mammary carcinoma, grade 1. This is concordant with the imaging findings. The patient has been notified of the results. Other than soreness, she is doing well and denies any biopsy site complications. Referral to an oncologist is recommended and this will be made by the breast center navigator. The patient has been instructed to call the [facility] with any questions or concerns. Electronically Signed   By: Everlean Alstrom M.D.   On: 07/22/2017 13:24   Result Date: 07/22/2017 CLINICAL DATA:  3.3 cm ulcerated mass with imaging features highly suspicious for malignancy in the 5 o'clock position of the left breast. EXAM: ULTRASOUND GUIDED LEFT BREAST CORE NEEDLE BIOPSY COMPARISON:  Previous exam(s). FINDINGS: I met with the patient and we discussed the procedure  of ultrasound-guided biopsy, including benefits and alternatives. We discussed the high likelihood of a successful procedure. We discussed the risks of the procedure, including infection, bleeding, tissue injury, clip migration, and inadequate sampling. Informed written consent was given. The usual time-out protocol was performed immediately prior to the procedure. Lesion quadrant: Lower outer quadrant Using sterile technique and 1% Lidocaine as local anesthetic, under direct ultrasound visualization, a 12 gauge spring-loaded device was used to perform biopsy of the recently demonstrated 3.3 cm mass in the 5 o'clock position of the left breast using a lateral approach. At the conclusion of the procedure a heart shaped tissue marker clip was deployed into the biopsy cavity. Follow up 2 view mammogram was performed and dictated separately. IMPRESSION: Ultrasound guided biopsy of the recently demonstrated 3.3 cm mass in the 5 o'clock position of the left breast. No apparent complications. Electronically Signed: By: Claudie Revering M.D. 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  PLAN:   1.  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  Patient will also require Udenyca support during the dose dense Adriamycin and Cytoxan portion of her treatment.  A referral was sent to surgery for port placement and to establish care for possible lumpectomy or mastectomy at the conclusion of chemotherapy.  Patient will also will require adjuvant XRT.  At the conclusion of all of her treatments she will take  an aromatase inhibitor for a total of 5 years.  Prior to initiating  treatment patient will require a MUGA scan.  Return to clinic on August 11, 2017 for further evaluation and consideration of cycle 1 of 4 of Adriamycin and Cytoxan.  Approximately 60 minutes was spent in discussion of which greater than 50% was consultation.  Patient expressed understanding and was in agreement with this plan. She also understands that She can call clinic at any time with any questions, concerns, or complaints.   Cancer Staging Primary cancer of lower outer quadrant of left female breast [facility]) Staging form: Breast, AJCC 8th Edition - Clinical stage from 07/27/2017: Stage IIIB (cT4b, cN0, cM0, G1, ER+, PR+, HER2-) - Signed by Lloyd Huger, MD on 07/27/2017   Lloyd Huger, MD   07/30/2017 1:12 PM

## 2017-07-26 LAB — SURGICAL PATHOLOGY

## 2017-07-27 ENCOUNTER — Other Ambulatory Visit: Payer: Self-pay

## 2017-07-27 ENCOUNTER — Inpatient Hospital Stay: Payer: PPO | Attending: Oncology | Admitting: Oncology

## 2017-07-27 ENCOUNTER — Encounter: Payer: Self-pay | Admitting: Oncology

## 2017-07-27 VITALS — BP 172/83 | HR 76 | Temp 98.0°F | Resp 20 | Ht 62.0 in | Wt 176.0 lb

## 2017-07-27 DIAGNOSIS — C50512 Malignant neoplasm of lower-outer quadrant of left female breast: Secondary | ICD-10-CM | POA: Diagnosis not present

## 2017-07-27 DIAGNOSIS — Z170 Estrogen receptor positive status [ER+]: Secondary | ICD-10-CM | POA: Diagnosis not present

## 2017-07-27 NOTE — Progress Notes (Signed)
Patient here for referral for breast cancer.

## 2017-07-28 ENCOUNTER — Ambulatory Visit: Payer: Self-pay | Admitting: General Surgery

## 2017-07-28 DIAGNOSIS — C50512 Malignant neoplasm of lower-outer quadrant of left female breast: Secondary | ICD-10-CM | POA: Diagnosis not present

## 2017-07-28 NOTE — Progress Notes (Signed)
  Oncology Nurse Navigator Documentation  Navigator Location: [facility] (07/28/17 0900)   )Navigator Encounter Type: Telephone (07/28/17 0900) Telephone: Incoming Call;Outgoing Call;Appt Confirmation/Clarification (07/28/17 0900)                   Patient Visit Type: Follow-up (07/28/17 0900) Treatment Phase: Pre-Tx/Tx Discussion (07/28/17 0900) Barriers/Navigation Needs: Coordination of Care (07/28/17 0900) Education: Preparing for Upcoming Surgery/ Treatment (07/28/17 0900) Interventions: Coordination of Care (07/28/17 0900)   Coordination of Care: Appts (07/28/17 0900)                  Time Spent with Patient: 15 (07/28/17 0900)  Patient called saying date of MUGA scan interfered with eye treatment.  Quentin Angst rescheduled MUGA for 08/06/17 at 1:00 patient to arrive at 12:30.  Phoned patient with new appointment information.

## 2017-07-28 NOTE — H&P (Signed)
PATIENT PROFILE: Kristin Estrada is a 77 y.o. female who presents to the Clinic for consultation at the request of Dr. Grayland Ormond for evaluation of breast cancer patient for chemo port placement.  PCP:  Magnus Sinning, MD  HISTORY OF PRESENT ILLNESS: Kristin Estrada reports that two months ago she felt pain on her left breast while sleeping and felt a skin lesion. She thought that it was an insect bite and tried "to let the body absorb it". Two month later she went to the PCP for cholesterol check up and notified the PCP about the skin lesion. The PCP ordered a mammogram and ultrasound of the breast which was done on 07/09/17 and patient was found with a 3 cm mass. The mass was biopsied on 07/21/17 and found with invasive mammary carcinoma. Patient was evaluated by Dr. Grayland Ormond on 07/27/17 who due to the size of the lesion and the ulceration, recommended to start with neoadjuvant chemotherapy. Patient comes today for discussion of port placement.   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  That is, the more sensitive the epithelium damaged for virus, the more the cough, the more the secondary reflux (especially in those prone to reflux) the more the irritation of the sensitive mucosa and so on in a cyclical pattern.  - since can't tol nexium will offer combination of generic protonix and hs pepcid  See instructions for specific recommendations which were reviewed directly with the patient who was given a copy with highlighter outlining the key components.      Obstructive chronic bronchitis without exacerbation , unspecified (CMS-HCC) 11/02/2006   Overview    PFTs 11/2005 FEV1  1.40 (67%) ratio 61 PFTs 01/30/2014>> FeV1 60%  FeV1 78%   FeV1/FVC 67%    Last Assessment & Plan:  Respiratory infection versus flare of her allergies. She not wheezing she does not have productive cough. I do not believe this is an exacerbation of COPD at this time.. Based on Her history she is at risk for a flare. We discussed the signs and symptoms of an acute exacerbation today and I wrote for prescriptions for prednisone and doxycycline for her to take if these evolve. I gave her prescription for her Tussionex at her request         GENERAL REVIEW OF SYSTEMS:   General ROS: negative for - chills, fatigue, fever, weight  gain or weight loss Allergy and Immunology ROS: negative for - hives  Hematological and Lymphatic ROS: Positive for - bleeding problems or bruising, negative for palpable nodes Endocrine ROS: negative for - heat or cold intolerance, hair changes Respiratory ROS: negative for - cough. Positive for shortness of breath or wheezing Cardiovascular ROS: no chest pain or palpitations GI ROS: negative for nausea, vomiting, abdominal pain, diarrhea, constipation Musculoskeletal ROS: Positive for - joint swelling or muscle pain Neurological ROS: negative for -  confusion, syncope Dermatological ROS: negative for pruritus and rash Psychiatric: negative for anxiety, depression. Positive for difficulty sleeping and memory loss  MEDICATIONS: CurrentMedications  Current Outpatient Medications  Medication Sig Dispense Refill  . acetaminophen (TYLENOL) 500 MG tablet Take 500 mg by mouth every 6 (six) hours      . albuterol (PROAIR HFA) 90 mcg/actuation inhaler Inhale 2 inhalations into the lungs every 6 (six) hours as needed      . amLODIPine (NORVASC) 5 MG tablet Take 5 mg by mouth once daily      . ascorbic acid, vitamin C, (VITAMIN C) 500 MG tablet Take 500 mg by mouth once daily      . bevacizumab (AVASTIN) 25 mg/mL intralesional injection Apply to eye    . budesonide-formoterol (SYMBICORT) 160-4.5 mcg/actuation inhaler Inhale 2 puffs into the lungs 2 (two) times daily.    . cetirizine (ZYRTEC) 10 MG tablet Take 10 mg by mouth once daily      . cholecalciferol (CHOLECALCIFEROL) 1,000 unit tablet Take 1,000 Units by mouth once daily      . coenzyme Q10 10 mg capsule Take 10 mg by mouth once daily    . dextromethorphan-guaifenesin (MUCINEX DM) 30-600 mg ER tablet Take 1 tablet by mouth every 12 (twelve) hours    . diazePAM (VALIUM) 5 MG tablet Take 5 mg by mouth as needed      . diclofenac (VOLTAREN) 75 MG EC tablet Take 75 mg by mouth 2 (two) times daily    . FREESTYLE LITE STRIPS test strip once daily      . ipratropium (ATROVENT) 0.02 % nebulizer solution Inhale 0.02 mcg into the lungs as needed    . levothyroxine (SYNTHROID, LEVOTHROID) 25 MCG tablet Take 25 mcg by mouth every morning before breakfast    . losartan (COZAAR) 100 MG tablet Take 100 mg by mouth once daily    . magnesium oxide 400 mg magnesium Take 400 mg by mouth 2 (two) times daily    . metoprolol succinate (TOPROL-XL) 25 MG XL tablet Take 25 mg by mouth once daily    . mometasone (NASONEX) 50 mcg/actuation nasal spray 1 spray by Nasal route once  daily    . nystatin (MYCOSTATIN) 100,000 unit/mL suspension Take 5 mLs by mouth as needed     No current facility-administered medications for this visit.       ALLERGIES: Pantoprazole sodium and Statins-hmg-coa reductase inhibitors  PAST MEDICAL HISTORY:     Past Medical History:  Diagnosis Date  . Cancer (CMS-HCC)   . GERD (gastroesophageal reflux disease)     PAST SURGICAL HISTORY: History reviewed. No pertinent surgical history.   FAMILY HISTORY: History reviewed. No pertinent family history.   SOCIAL HISTORY: Social History          Socioeconomic History  . Marital status: Unknown    Spouse name: Not on file  . Number of children: Not on file  . Years of education: Not on file  . Highest education level:  Not on file  Occupational History  . Not on file  Social Needs  . Financial resource strain: Not on file  . Food insecurity:    Worry: Not on file    Inability: Not on file  . Transportation needs:    Medical: Not on file    Non-medical: Not on file  Tobacco Use  . Smoking status: Former Smoker    Last attempt to quit: 2006    Years since quitting: 13.4  . Smokeless tobacco: Never Used  Substance and Sexual Activity  . Alcohol use: Never    Frequency: Never  . Drug use: Not on file  . Sexual activity: Not on file  Other Topics Concern  . Not on file  Social History Narrative  . Not on file      PHYSICAL EXAM:    Vitals:   07/28/17 1456  BP: 148/70  Pulse: 70  Temp: 36.8 C (98.2 F)   Body mass index is 29.01 kg/m. Weight: 79.1 kg (174 lb 4.8 oz)   GENERAL: Alert, active, oriented x3  HEENT: Pupils equal reactive to light. Extraocular movements are intact. Sclera clear. Palpebral conjunctiva normal red color.Pharynx clear.  NECK: Supple with no palpable mass and no adenopathy.  LUNGS: Sound clear with no rales rhonchi or wheezes.  HEART: Regular rhythm S1 and S2 without murmur.  BREAST:  right breast normal without mass, skin or nipple changes or axillary nodes, abnormal mass palpable on the left breast with skin ulceration and nipple retraction. There is no axillary lymph nodes palpable clinically.  ABDOMEN: Soft and depressible, nontender with no palpable mass, no hepatomegaly.  EXTREMITIES: Well-developed well-nourished symmetrical with no dependent edema.  NEUROLOGICAL: Awake alert oriented, facial expression symmetrical, moving all extremities.  REVIEW OF DATA: I have reviewed the following data today: No results found for any previous visit.    I personally reviewed the mammogram and ultrasound images showing the big mass on the left breast at 0500.  I personally reviewed the images of the mammography after clip placement.   No recent labs on epic to be reviewed.   I reviewed the Oncologist note discussing the recommendation of neoadjuvant therapy.   ASSESSMENT: Kristin Estrada is a 77 y.o. female presenting for consultation for breast cancer.    Patient was oriented again about the pathology results and the complexity of her case. Surgical alternatives were discussed with patient including partial vs total mastectomy. With the size and complex ulceration of the patient, I agree with Oncologist to start with neoadjuvant therapy and if patient respond well to the therapy, discussion of partial mastectomy vs total mastectomy will be done. Clinically and by imaging the axillary lymph nodes are negative. Surgical technique and post operative care was discussed with patient. Risk of surgery was discussed with patient including but not limited to: wound infection, bleeding, pneumothorax, hemothorax, severe vascular injury, deep venous thrombosis, pain, scars, among others.   PLAN: 1. Insertion of Port a Cath 2. CBC, CMP 3. Internal Medicine clearance 4. Do not take aspirin 5 days before procedure  Patient verbalized understanding, all questions were  answered, and were agreeable with the plan outlined above.   This was a more than 60 minutes encounter more than 50% of the time counseling the patient about the diagnosis, pathology report, treatment alternatives, surgical alternatives, benefits, risks and plan of care.   [name], MD  Electronically signed by [name], MD

## 2017-07-29 ENCOUNTER — Other Ambulatory Visit: Payer: Self-pay

## 2017-07-29 ENCOUNTER — Inpatient Hospital Stay: Payer: PPO

## 2017-07-29 NOTE — Patient Instructions (Signed)
Doxorubicin injection What is this medicine? DOXORUBICIN (dox oh ROO bi sin) is a chemotherapy drug. It is used to treat many kinds of cancer like leukemia, lymphoma, neuroblastoma, sarcoma, and Wilms' tumor. It is also used to treat bladder cancer, breast cancer, lung cancer, ovarian cancer, stomach cancer, and thyroid cancer. This medicine may be used for other purposes; ask your health care provider or pharmacist if you have questions. COMMON BRAND NAME(S): Adriamycin, Adriamycin PFS, Adriamycin RDF, Rubex What should I tell my health care provider before I take this medicine? They need to know if you have any of these conditions: -heart disease -history of low blood counts caused by a medicine -liver disease -recent or ongoing radiation therapy -an unusual or allergic reaction to doxorubicin, other chemotherapy agents, other medicines, foods, dyes, or preservatives -pregnant or trying to get pregnant -breast-feeding How should I use this medicine? This drug is given as an infusion into a vein. It is administered in a hospital or clinic by a specially trained health care professional. If you have pain, swelling, burning or any unusual feeling around the site of your injection, tell your health care professional right away. Talk to your pediatrician regarding the use of this medicine in children. Special care may be needed. Overdosage: If you think you have taken too much of this medicine contact a poison control center or emergency room at once. NOTE: This medicine is only for you. Do not share this medicine with others. What if I miss a dose? It is important not to miss your dose. Call your doctor or health care professional if you are unable to keep an appointment. What may interact with this medicine? This medicine may interact with the following medications: -6-mercaptopurine -paclitaxel -phenytoin -St. John's Wort -trastuzumab -verapamil This list may not describe all possible  interactions. Give your health care provider a list of all the medicines, herbs, non-prescription drugs, or dietary supplements you use. Also tell them if you smoke, drink alcohol, or use illegal drugs. Some items may interact with your medicine. What should I watch for while using this medicine? This drug may make you feel generally unwell. This is not uncommon, as chemotherapy can affect healthy cells as well as cancer cells. Report any side effects. Continue your course of treatment even though you feel ill unless your doctor tells you to stop. There is a maximum amount of this medicine you should receive throughout your life. The amount depends on the medical condition being treated and your overall health. Your doctor will watch how much of this medicine you receive in your lifetime. Tell your doctor if you have taken this medicine before. You may need blood work done while you are taking this medicine. Your urine may turn red for a few days after your dose. This is not blood. If your urine is dark or brown, call your doctor. In some cases, you may be given additional medicines to help with side effects. Follow all directions for their use. Call your doctor or health care professional for advice if you get a fever, chills or sore throat, or other symptoms of a cold or flu. Do not treat yourself. This drug decreases your body's ability to fight infections. Try to avoid being around people who are sick. This medicine may increase your risk to bruise or bleed. Call your doctor or health care professional if you notice any unusual bleeding. Talk to your doctor about your risk of cancer. You may be more at risk for certain   types of cancers if you take this medicine. Do not become pregnant while taking this medicine or for 6 months after stopping it. Women should inform their doctor if they wish to become pregnant or think they might be pregnant. Men should not father a child while taking this medicine and  for 6 months after stopping it. There is a potential for serious side effects to an unborn child. Talk to your health care professional or pharmacist for more information. Do not breast-feed an infant while taking this medicine. This medicine has caused ovarian failure in some women and reduced sperm counts in some men This medicine may interfere with the ability to have a child. Talk with your doctor or health care professional if you are concerned about your fertility. What side effects may I notice from receiving this medicine? Side effects that you should report to your doctor or health care professional as soon as possible: -allergic reactions like skin rash, itching or hives, swelling of the face, lips, or tongue -breathing problems -chest pain -fast or irregular heartbeat -low blood counts - this medicine may decrease the number of white blood cells, red blood cells and platelets. You may be at increased risk for infections and bleeding. -pain, redness, or irritation at site where injected -signs of infection - fever or chills, cough, sore throat, pain or difficulty passing urine -signs of decreased platelets or bleeding - bruising, pinpoint red spots on the skin, black, tarry stools, blood in the urine -swelling of the ankles, feet, hands -tiredness -weakness Side effects that usually do not require medical attention (report to your doctor or health care professional if they continue or are bothersome): -diarrhea -hair loss -mouth sores -nail discoloration or damage -nausea -red colored urine -vomiting This list may not describe all possible side effects. Call your doctor for medical advice about side effects. You may report side effects to FDA at 1-800-FDA-1088. Where should I keep my medicine? This drug is given in a hospital or clinic and will not be stored at home. NOTE: This sheet is a summary. It may not cover all possible information. If you have questions about this  medicine, talk to your doctor, pharmacist, or health care provider.  2018 [employer] Standard (2015-04-01 11:28:51) Cyclophosphamide injection What is this medicine? CYCLOPHOSPHAMIDE (sye kloe FOSS fa mide) is a chemotherapy drug. It slows the growth of cancer cells. This medicine is used to treat many types of cancer like lymphoma, myeloma, leukemia, breast cancer, and ovarian cancer, to name a few. This medicine may be used for other purposes; ask your health care provider or pharmacist if you have questions. COMMON BRAND NAME(S): Cytoxan, Neosar What should I tell my health care provider before I take this medicine? They need to know if you have any of these conditions: -blood disorders -history of other chemotherapy -infection -kidney disease -liver disease -recent or ongoing radiation therapy -tumors in the bone marrow -an unusual or allergic reaction to cyclophosphamide, other chemotherapy, other medicines, foods, dyes, or preservatives -pregnant or trying to get pregnant -breast-feeding How should I use this medicine? This drug is usually given as an injection into a vein or muscle or by infusion into a vein. It is administered in a hospital or clinic by a specially trained health care professional. Talk to your pediatrician regarding the use of this medicine in children. Special care may be needed. Overdosage: If you think you have taken too much of this medicine contact a poison control center or emergency room at   once. NOTE: This medicine is only for you. Do not share this medicine with others. What if I miss a dose? It is important not to miss your dose. Call your doctor or health care professional if you are unable to keep an appointment. What may interact with this medicine? This medicine may interact with the following medications: -amiodarone -amphotericin B -azathioprine -certain antiviral medicines for HIV or AIDS such as protease inhibitors (e.g., indinavir,  ritonavir) and zidovudine -certain blood pressure medications such as benazepril, captopril, enalapril, fosinopril, lisinopril, moexipril, monopril, perindopril, quinapril, ramipril, trandolapril -certain cancer medications such as anthracyclines (e.g., daunorubicin, doxorubicin), busulfan, cytarabine, paclitaxel, pentostatin, tamoxifen, trastuzumab -certain diuretics such as chlorothiazide, chlorthalidone, hydrochlorothiazide, indapamide, metolazone -certain medicines that treat or prevent blood clots like warfarin -certain muscle relaxants such as succinylcholine -cyclosporine -etanercept -indomethacin -medicines to increase blood counts like filgrastim, pegfilgrastim, sargramostim -medicines used as general anesthesia -metronidazole -natalizumab This list may not describe all possible interactions. Give your health care provider a list of all the medicines, herbs, non-prescription drugs, or dietary supplements you use. Also tell them if you smoke, drink alcohol, or use illegal drugs. Some items may interact with your medicine. What should I watch for while using this medicine? Visit your doctor for checks on your progress. This drug may make you feel generally unwell. This is not uncommon, as chemotherapy can affect healthy cells as well as cancer cells. Report any side effects. Continue your course of treatment even though you feel ill unless your doctor tells you to stop. Drink water or other fluids as directed. Urinate often, even at night. In some cases, you may be given additional medicines to help with side effects. Follow all directions for their use. Call your doctor or health care professional for advice if you get a fever, chills or sore throat, or other symptoms of a cold or flu. Do not treat yourself. This drug decreases your body's ability to fight infections. Try to avoid being around people who are sick. This medicine may increase your risk to bruise or bleed. Call your doctor or  health care professional if you notice any unusual bleeding. Be careful brushing and flossing your teeth or using a toothpick because you may get an infection or bleed more easily. If you have any dental work done, tell your dentist you are receiving this medicine. You may get drowsy or dizzy. Do not drive, use machinery, or do anything that needs mental alertness until you know how this medicine affects you. Do not become pregnant while taking this medicine or for 1 year after stopping it. Women should inform their doctor if they wish to become pregnant or think they might be pregnant. Men should not father a child while taking this medicine and for 4 months after stopping it. There is a potential for serious side effects to an unborn child. Talk to your health care professional or pharmacist for more information. Do not breast-feed an infant while taking this medicine. This medicine may interfere with the ability to have a child. This medicine has caused ovarian failure in some women. This medicine has caused reduced sperm counts in some men. You should talk with your doctor or health care professional if you are concerned about your fertility. If you are going to have surgery, tell your doctor or health care professional that you have taken this medicine. What side effects may I notice from receiving this medicine? Side effects that you should report to your doctor or health care professional as   soon as possible: -allergic reactions like skin rash, itching or hives, swelling of the face, lips, or tongue -low blood counts - this medicine may decrease the number of white blood cells, red blood cells and platelets. You may be at increased risk for infections and bleeding. -signs of infection - fever or chills, cough, sore throat, pain or difficulty passing urine -signs of decreased platelets or bleeding - bruising, pinpoint red spots on the skin, black, tarry stools, blood in the urine -signs of  decreased red blood cells - unusually weak or tired, fainting spells, lightheadedness -breathing problems -dark urine -dizziness -palpitations -swelling of the ankles, feet, hands -trouble passing urine or change in the amount of urine -weight gain -yellowing of the eyes or skin Side effects that usually do not require medical attention (report to your doctor or health care professional if they continue or are bothersome): -changes in nail or skin color -hair loss -missed menstrual periods -mouth sores -nausea, vomiting This list may not describe all possible side effects. Call your doctor for medical advice about side effects. You may report side effects to FDA at 1-800-FDA-1088. Where should I keep my medicine? This drug is given in a hospital or clinic and will not be stored at home. NOTE: This sheet is a summary. It may not cover all possible information. If you have questions about this medicine, talk to your doctor, pharmacist, or health care provider.  2018 [employer] Standard (2011-12-18 16:22:58) Cyclophosphamide injection What is this medicine? CYCLOPHOSPHAMIDE (sye kloe FOSS fa mide) is a chemotherapy drug. It slows the growth of cancer cells. This medicine is used to treat many types of cancer like lymphoma, myeloma, leukemia, breast cancer, and ovarian cancer, to name a few. This medicine may be used for other purposes; ask your health care provider or pharmacist if you have questions. COMMON BRAND NAME(S): Cytoxan, Neosar What should I tell my health care provider before I take this medicine? They need to know if you have any of these conditions: -blood disorders -history of other chemotherapy -infection -kidney disease -liver disease -recent or ongoing radiation therapy -tumors in the bone marrow -an unusual or allergic reaction to cyclophosphamide, other chemotherapy, other medicines, foods, dyes, or preservatives -pregnant or trying to get  pregnant -breast-feeding How should I use this medicine? This drug is usually given as an injection into a vein or muscle or by infusion into a vein. It is administered in a hospital or clinic by a specially trained health care professional. Talk to your pediatrician regarding the use of this medicine in children. Special care may be needed. Overdosage: If you think you have taken too much of this medicine contact a poison control center or emergency room at once. NOTE: This medicine is only for you. Do not share this medicine with others. What if I miss a dose? It is important not to miss your dose. Call your doctor or health care professional if you are unable to keep an appointment. What may interact with this medicine? This medicine may interact with the following medications: -amiodarone -amphotericin B -azathioprine -certain antiviral medicines for HIV or AIDS such as protease inhibitors (e.g., indinavir, ritonavir) and zidovudine -certain blood pressure medications such as benazepril, captopril, enalapril, fosinopril, lisinopril, moexipril, monopril, perindopril, quinapril, ramipril, trandolapril -certain cancer medications such as anthracyclines (e.g., daunorubicin, doxorubicin), busulfan, cytarabine, paclitaxel, pentostatin, tamoxifen, trastuzumab -certain diuretics such as chlorothiazide, chlorthalidone, hydrochlorothiazide, indapamide, metolazone -certain medicines that treat or prevent blood clots like warfarin -certain muscle relaxants such as  succinylcholine -cyclosporine -etanercept -indomethacin -medicines to increase blood counts like filgrastim, pegfilgrastim, sargramostim -medicines used as general anesthesia -metronidazole -natalizumab This list may not describe all possible interactions. Give your health care provider a list of all the medicines, herbs, non-prescription drugs, or dietary supplements you use. Also tell them if you smoke, drink alcohol, or use illegal  drugs. Some items may interact with your medicine. What should I watch for while using this medicine? Visit your doctor for checks on your progress. This drug may make you feel generally unwell. This is not uncommon, as chemotherapy can affect healthy cells as well as cancer cells. Report any side effects. Continue your course of treatment even though you feel ill unless your doctor tells you to stop. Drink water or other fluids as directed. Urinate often, even at night. In some cases, you may be given additional medicines to help with side effects. Follow all directions for their use. Call your doctor or health care professional for advice if you get a fever, chills or sore throat, or other symptoms of a cold or flu. Do not treat yourself. This drug decreases your body's ability to fight infections. Try to avoid being around people who are sick. This medicine may increase your risk to bruise or bleed. Call your doctor or health care professional if you notice any unusual bleeding. Be careful brushing and flossing your teeth or using a toothpick because you may get an infection or bleed more easily. If you have any dental work done, tell your dentist you are receiving this medicine. You may get drowsy or dizzy. Do not drive, use machinery, or do anything that needs mental alertness until you know how this medicine affects you. Do not become pregnant while taking this medicine or for 1 year after stopping it. Women should inform their doctor if they wish to become pregnant or think they might be pregnant. Men should not father a child while taking this medicine and for 4 months after stopping it. There is a potential for serious side effects to an unborn child. Talk to your health care professional or pharmacist for more information. Do not breast-feed an infant while taking this medicine. This medicine may interfere with the ability to have a child. This medicine has caused ovarian failure in some women.  This medicine has caused reduced sperm counts in some men. You should talk with your doctor or health care professional if you are concerned about your fertility. If you are going to have surgery, tell your doctor or health care professional that you have taken this medicine. What side effects may I notice from receiving this medicine? Side effects that you should report to your doctor or health care professional as soon as possible: -allergic reactions like skin rash, itching or hives, swelling of the face, lips, or tongue -low blood counts - this medicine may decrease the number of white blood cells, red blood cells and platelets. You may be at increased risk for infections and bleeding. -signs of infection - fever or chills, cough, sore throat, pain or difficulty passing urine -signs of decreased platelets or bleeding - bruising, pinpoint red spots on the skin, black, tarry stools, blood in the urine -signs of decreased red blood cells - unusually weak or tired, fainting spells, lightheadedness -breathing problems -dark urine -dizziness -palpitations -swelling of the ankles, feet, hands -trouble passing urine or change in the amount of urine -weight gain -yellowing of the eyes or skin Side effects that usually do not require  medical attention (report to your doctor or health care professional if they continue or are bothersome): -changes in nail or skin color -hair loss -missed menstrual periods -mouth sores -nausea, vomiting This list may not describe all possible side effects. Call your doctor for medical advice about side effects. You may report side effects to FDA at 1-800-FDA-1088. Where should I keep my medicine? This drug is given in a hospital or clinic and will not be stored at home. NOTE: This sheet is a summary. It may not cover all possible information. If you have questions about this medicine, talk to your doctor, pharmacist, or health care provider.  2018 [employer]  Standard (2011-12-18 16:22:58) Paclitaxel injection What is this medicine? PACLITAXEL (PAK li TAX el) is a chemotherapy drug. It targets fast dividing cells, like cancer cells, and causes these cells to die. This medicine is used to treat ovarian cancer, breast cancer, and other cancers. This medicine may be used for other purposes; ask your health care provider or pharmacist if you have questions. COMMON BRAND NAME(S): Onxol, Taxol What should I tell my health care provider before I take this medicine? They need to know if you have any of these conditions: -blood disorders -irregular heartbeat -infection (especially a virus infection such as chickenpox, cold sores, or herpes) -liver disease -previous or ongoing radiation therapy -an unusual or allergic reaction to paclitaxel, alcohol, polyoxyethylated castor oil, other chemotherapy agents, other medicines, foods, dyes, or preservatives -pregnant or trying to get pregnant -breast-feeding How should I use this medicine? This drug is given as an infusion into a vein. It is administered in a hospital or clinic by a specially trained health care professional. Talk to your pediatrician regarding the use of this medicine in children. Special care may be needed. Overdosage: If you think you have taken too much of this medicine contact a poison control center or emergency room at once. NOTE: This medicine is only for you. Do not share this medicine with others. What if I miss a dose? It is important not to miss your dose. Call your doctor or health care professional if you are unable to keep an appointment. What may interact with this medicine? Do not take this medicine with any of the following medications: -disulfiram -metronidazole This medicine may also interact with the following medications: -cyclosporine -diazepam -ketoconazole -medicines to increase blood counts like filgrastim, pegfilgrastim, sargramostim -other chemotherapy drugs  like cisplatin, doxorubicin, epirubicin, etoposide, teniposide, vincristine -quinidine -testosterone -vaccines -verapamil Talk to your doctor or health care professional before taking any of these medicines: -acetaminophen -aspirin -ibuprofen -ketoprofen -naproxen This list may not describe all possible interactions. Give your health care provider a list of all the medicines, herbs, non-prescription drugs, or dietary supplements you use. Also tell them if you smoke, drink alcohol, or use illegal drugs. Some items may interact with your medicine. What should I watch for while using this medicine? Your condition will be monitored carefully while you are receiving this medicine. You will need important blood work done while you are taking this medicine. This medicine can cause serious allergic reactions. To reduce your risk you will need to take other medicine(s) before treatment with this medicine. If you experience allergic reactions like skin rash, itching or hives, swelling of the face, lips, or tongue, tell your doctor or health care professional right away. In some cases, you may be given additional medicines to help with side effects. Follow all directions for their use. This drug may make you feel generally  unwell. This is not uncommon, as chemotherapy can affect healthy cells as well as cancer cells. Report any side effects. Continue your course of treatment even though you feel ill unless your doctor tells you to stop. Call your doctor or health care professional for advice if you get a fever, chills or sore throat, or other symptoms of a cold or flu. Do not treat yourself. This drug decreases your body's ability to fight infections. Try to avoid being around people who are sick. This medicine may increase your risk to bruise or bleed. Call your doctor or health care professional if you notice any unusual bleeding. Be careful brushing and flossing your teeth or using a toothpick because you  may get an infection or bleed more easily. If you have any dental work done, tell your dentist you are receiving this medicine. Avoid taking products that contain aspirin, acetaminophen, ibuprofen, naproxen, or ketoprofen unless instructed by your doctor. These medicines may hide a fever. Do not become pregnant while taking this medicine. Women should inform their doctor if they wish to become pregnant or think they might be pregnant. There is a potential for serious side effects to an unborn child. Talk to your health care professional or pharmacist for more information. Do not breast-feed an infant while taking this medicine. Men are advised not to father a child while receiving this medicine. This product may contain alcohol. Ask your pharmacist or healthcare provider if this medicine contains alcohol. Be sure to tell all healthcare providers you are taking this medicine. Certain medicines, like metronidazole and disulfiram, can cause an unpleasant reaction when taken with alcohol. The reaction includes flushing, headache, nausea, vomiting, sweating, and increased thirst. The reaction can last from 30 minutes to several hours. What side effects may I notice from receiving this medicine? Side effects that you should report to your doctor or health care professional as soon as possible: -allergic reactions like skin rash, itching or hives, swelling of the face, lips, or tongue -low blood counts - This drug may decrease the number of white blood cells, red blood cells and platelets. You may be at increased risk for infections and bleeding. -signs of infection - fever or chills, cough, sore throat, pain or difficulty passing urine -signs of decreased platelets or bleeding - bruising, pinpoint red spots on the skin, black, tarry stools, nosebleeds -signs of decreased red blood cells - unusually weak or tired, fainting spells, lightheadedness -breathing problems -chest pain -high or low blood  pressure -mouth sores -nausea and vomiting -pain, swelling, redness or irritation at the injection site -pain, tingling, numbness in the hands or feet -slow or irregular heartbeat -swelling of the ankle, feet, hands Side effects that usually do not require medical attention (report to your doctor or health care professional if they continue or are bothersome): -bone pain -complete hair loss including hair on your head, underarms, pubic hair, eyebrows, and eyelashes -changes in the color of fingernails -diarrhea -loosening of the fingernails -loss of appetite -muscle or joint pain -red flush to skin -sweating This list may not describe all possible side effects. Call your doctor for medical advice about side effects. You may report side effects to FDA at 1-800-FDA-1088. Where should I keep my medicine? This drug is given in a hospital or clinic and will not be stored at home. NOTE: This sheet is a summary. It may not cover all possible information. If you have questions about this medicine, talk to your doctor, pharmacist, or health care provider.  2018 Elsevier/Gold Standard (2014-12-04 19:58:00)

## 2017-07-30 DIAGNOSIS — I10 Essential (primary) hypertension: Secondary | ICD-10-CM | POA: Diagnosis not present

## 2017-07-30 DIAGNOSIS — E119 Type 2 diabetes mellitus without complications: Secondary | ICD-10-CM | POA: Diagnosis not present

## 2017-07-30 DIAGNOSIS — J441 Chronic obstructive pulmonary disease with (acute) exacerbation: Secondary | ICD-10-CM | POA: Diagnosis not present

## 2017-07-30 MED ORDER — LIDOCAINE-PRILOCAINE 2.5-2.5 % EX CREA: TOPICAL_CREAM | CUTANEOUS | 3 refills | Status: DC

## 2017-07-30 MED ORDER — PROCHLORPERAZINE MALEATE 10 MG PO TABS: 10.0000 mg | ORAL_TABLET | Freq: Four times a day (QID) | ORAL | 2 refills | Status: DC | PRN

## 2017-07-30 MED ORDER — ONDANSETRON HCL 8 MG PO TABS: 8.0000 mg | ORAL_TABLET | Freq: Two times a day (BID) | ORAL | 2 refills | Status: DC | PRN

## 2017-07-30 NOTE — Progress Notes (Signed)
START ON PATHWAY REGIMEN - Breast   Dose-Dense AC q14 days:   A cycle is every 14 days:     Doxorubicin      Cyclophosphamide      Pegfilgrastim-xxxx   **Always confirm dose/schedule in your pharmacy ordering system**  Paclitaxel 80 mg/m2 Weekly:   Administer weekly:     Paclitaxel   **Always confirm dose/schedule in your pharmacy ordering system**  Patient Characteristics: Preoperative or Nonsurgical Candidate (Clinical Staging), Neoadjuvant Therapy followed by Surgery, Invasive Disease, Chemotherapy, HER2 Negative/Unknown/Equivocal, ER Positive Therapeutic Status: Preoperative or Nonsurgical Candidate (Clinical Staging) AJCC M Category: cM0 AJCC Grade: G1 Breast Surgical Plan: Neoadjuvant Therapy followed by Surgery ER Status: Positive (+) AJCC 8 Stage Grouping: IIIB HER2 Status: Negative (-) AJCC T Category: cT4b AJCC N Category: cN0 PR Status: Positive (+) Intent of Therapy: Curative Intent, Discussed with Patient

## 2017-08-02 ENCOUNTER — Encounter
Admission: RE | Admit: 2017-08-02 | Discharge: 2017-08-02 | Disposition: A | Discharge status: Home / Self Care | Payer: PPO | Source: Ambulatory Visit | Attending: General Surgery | Admitting: General Surgery

## 2017-08-02 ENCOUNTER — Other Ambulatory Visit: Payer: Self-pay

## 2017-08-02 ENCOUNTER — Encounter: Payer: Self-pay | Admitting: Genetics

## 2017-08-02 DIAGNOSIS — Z01818 Encounter for other preprocedural examination: Secondary | ICD-10-CM | POA: Diagnosis not present

## 2017-08-02 DIAGNOSIS — I10 Essential (primary) hypertension: Secondary | ICD-10-CM | POA: Diagnosis not present

## 2017-08-02 DIAGNOSIS — C50512 Malignant neoplasm of lower-outer quadrant of left female breast: Secondary | ICD-10-CM | POA: Insufficient documentation

## 2017-08-02 HISTORY — DX: Hypothyroidism, unspecified: E03.9

## 2017-08-02 HISTORY — DX: Malignant (primary) neoplasm, unspecified: C80.1

## 2017-08-02 HISTORY — DX: Cramp and spasm: R25.2

## 2017-08-02 NOTE — Patient Instructions (Signed)
Your procedure is scheduled on: 08/04/17 Wed Report to Same Day Surgery 2nd floor medical mall [facility] Entrance-take elevator on left to 2nd floor.  Check in with surgery information desk.) To find out your arrival time please call [phone] between 1PM - 3PM on 08/03/17 Tues  Remember: Instructions that are not followed completely may result in serious medical risk, up to and including death, or upon the discretion of your surgeon and anesthesiologist your surgery may need to be rescheduled.    _x___ 1. Do not eat food after midnight the night before your procedure. You may drink clear liquids up to 2 hours before you are scheduled to arrive at the hospital for your procedure.  Do not drink clear liquids within 2 hours of your scheduled arrival to the hospital.  Clear liquids include  --Water or Apple juice without pulp  --Clear carbohydrate beverage such as ClearFast or Gatorade  --Black Coffee or Clear Tea (No milk, no creamers, do not add anything to                  the coffee or Tea Type 1 and type 2 diabetics should only drink water.  No gum chewing or hard candies.     __x__ 2. No Alcohol for 24 hours before or after surgery.   __x__3. No Smoking or e-cigarettes for 24 prior to surgery.  Do not use any chewable tobacco products for at least 6 hour prior to surgery   ____  4. Bring all medications with you on the day of surgery if instructed.    __x__ 5. Notify your doctor if there is any change in your medical condition     (cold, fever, infections).    x___6. On the morning of surgery brush your teeth with toothpaste and water.  You may rinse your mouth with mouth wash if you wish.  Do not swallow any toothpaste or mouthwash.   Do not wear jewelry, make-up, hairpins, clips or nail polish.  Do not wear lotions, powders, or perfumes. You may wear deodorant.  Do not shave 48 hours prior to surgery. Men may shave face and neck.  Do not bring valuables to the hospital.     [facility] is not responsible for any belongings or valuables.               Contacts, dentures or bridgework may not be worn into surgery.  Leave your suitcase in the car. After surgery it may be brought to your room.  For patients admitted to the hospital, discharge time is determined by your                       treatment team.  _  Patients discharged the day of surgery will not be allowed to drive home.  You will need someone to drive you home and stay with you the night of your procedure.    Please read over the following fact sheets that you were given:   [facility] Preparing for Surgery and or MRSA Information   _x___ Take anti-hypertensive listed below, cardiac, seizure, asthma,     anti-reflux and psychiatric medicines. These include:  1. albuterol (PROAIR HFA) 108 (90 BASE) MCG/ACT inhaler  2DULoxetine (CYMBALTA) 60 MG capsule  3.amLODipine (NORVASC) 5 MG tablet  4.esomeprazole (NEXIUM) 40 MG capsule  5.levothyroxine (SYNTHROID, LEVOTHROID) 25 MCG tablet  6.metoprolol succinate (TOPROL-XL) 25 MG 24 hr tablet  7.SYMBICORT 160-4.5 MCG/ACT inhaler  ____Fleets enema or  Magnesium Citrate as directed.   _x___ Use CHG Soap or sage wipes as directed on instruction sheet   __x__ Use inhalers on the day of surgery and bring to hospital day of surgery  ____ Stop Metformin and Janumet 2 days prior to surgery.    ____ Take 1/2 of usual insulin dose the night before surgery and none on the morning     surgery.   _x___ Follow recommendations from Cardiologist, Pulmonologist or PCP regarding          stopping Aspirin, Coumadin, Plavix ,Eliquis, Effient, or Pradaxa, and Pletal.  X____Stop Anti-inflammatories such as Advil, Aleve, Ibuprofen, Motrin, Naproxen, Naprosyn, Goodies powders or aspirin products. OK to take Tylenol and                          Celebrex.   _x___ Stop supplements until after surgery.  But may continue Vitamin D, Vitamin B,       and multivitamin.   ____  Bring C-Pap to the hospital.

## 2017-08-02 NOTE — Progress Notes (Signed)
  Oncology Nurse Navigator Documentation  Navigator Location: [facility] (08/02/17 1500)   )Navigator Encounter Type: Telephone (08/02/17 1500) Telephone: Incoming Call;Outgoing Call;Symptom Mgt;Appt Confirmation/Clarification (08/02/17 1500)                   Patient Visit Type: Follow-up (08/02/17 1500)     Education: Preparing for Upcoming Surgery/ Treatment (08/02/17 1500) Interventions: Coordination of Care;Education (08/02/17 1500)                      Time Spent with Patient: 15 (08/02/17 1500)   Reviewed preparation for first chemotherapy.

## 2017-08-02 NOTE — Pre-Procedure Instructions (Signed)
Scheduled for MUGA scan on Fri.

## 2017-08-03 DIAGNOSIS — H353212 Exudative age-related macular degeneration, right eye, with inactive choroidal neovascularization: Secondary | ICD-10-CM | POA: Diagnosis not present

## 2017-08-03 NOTE — Progress Notes (Addendum)
  Oncology Nurse Navigator Documentation  Navigator Location: [facility] (08/03/17 1000)   )Navigator Encounter Type: Telephone (08/03/17 1000)                     Patient Visit Type: Follow-up (08/03/17 1000) Treatment Phase: Pre-Tx/Tx Discussion (08/03/17 1000) Barriers/Navigation Needs: Coordination of Care;Education (08/03/17 1000) Education: Pain/ Symptom Management;Preparing for Upcoming Surgery/ Treatment (08/03/17 1000) Interventions: Coordination of Care;Psycho-social support (08/03/17 1000)   Coordination of Care: Appts (08/03/17 1000) Education Method: Verbal (08/03/17 1000)                Time Spent with Patient: 85 (08/03/17 1000)   Patient to folllow-up with Dr. Grayland Ormond to discuss treatment plan.  Scheduled for 08/05/17 at 9:15.  Spoke to [place] at Dr. Deniece Ree office to confirm port placement for tomorrow has been cancelled.  Patient aware of changes in appointments.  States she is having allergy symptoms of sneezing, and slight wheezing.  Okay to take her Zyrtec per Dr. Grayland Ormond.

## 2017-08-04 ENCOUNTER — Encounter: Admission: RE | Payer: Self-pay | Source: Ambulatory Visit

## 2017-08-04 ENCOUNTER — Ambulatory Visit: Admission: RE | Admit: 2017-08-04 | Payer: PPO | Source: Ambulatory Visit | Admitting: General Surgery

## 2017-08-04 SURGERY — INSERTION, TUNNELED CENTRAL VENOUS DEVICE, WITH PORT
Anesthesia: General | Laterality: Right

## 2017-08-05 ENCOUNTER — Inpatient Hospital Stay: Payer: PPO

## 2017-08-05 ENCOUNTER — Encounter: Payer: Self-pay | Admitting: Oncology

## 2017-08-05 ENCOUNTER — Inpatient Hospital Stay (HOSPITAL_BASED_OUTPATIENT_CLINIC_OR_DEPARTMENT_OTHER): Payer: PPO | Admitting: Oncology

## 2017-08-05 VITALS — BP 149/80 | HR 69 | Temp 98.2°F | Resp 16 | Wt 173.4 lb

## 2017-08-05 DIAGNOSIS — Z170 Estrogen receptor positive status [ER+]: Secondary | ICD-10-CM

## 2017-08-05 DIAGNOSIS — C50512 Malignant neoplasm of lower-outer quadrant of left female breast: Secondary | ICD-10-CM | POA: Diagnosis not present

## 2017-08-05 DIAGNOSIS — J441 Chronic obstructive pulmonary disease with (acute) exacerbation: Secondary | ICD-10-CM | POA: Diagnosis not present

## 2017-08-08 NOTE — Progress Notes (Signed)
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  INTERVAL HISTORY: Patient returns to clinic today for further evaluation and re-discussion of her treatment plan.  She continues to have pain at the site of her skin ulceration from her breast cancer, but otherwise feels well.  She continues to be anxious. She has no neurologic complaints.  She denies any recent fevers or illnesses.  She has a good appetite and denies weight loss.  She denies any pain.  She has no chest pain or shortness of breath.  She denies any nausea, vomiting, constipation, or diarrhea.  She has no urinary complaints.  Patient offers no further specific complaints today.  REVIEW OF SYSTEMS:   Review of Systems  Constitutional: Negative.  Negative for fever, malaise/fatigue and weight loss.  Respiratory: Negative.  Negative for cough and shortness of breath.   Cardiovascular: Negative.  Negative for chest pain and leg swelling.  Gastrointestinal: Negative.  Negative for abdominal pain.  Genitourinary: Negative.  Negative for dysuria.  Musculoskeletal: Negative.  Negative for back pain.  Skin: Negative.  Negative for rash.  Neurological: Negative.  Negative for sensory change, focal weakness and weakness.  Psychiatric/Behavioral: Negative.  The patient is not nervous/anxious.     As per HPI. Otherwise, a complete review of systems is negative.  PAST MEDICAL HISTORY: Past Medical History:  Diagnosis Date  . Arthritis   . Bronchitis   . Bruises easily   . Cancer (HCC)    breast  . Cataracts, bilateral    . Chronic airway obstruction, not elsewhere classified    Symbicort daily and Albuterol daily as needed  . Chronic back pain    arthritis  . Chronic bronchitis ([place])   . Cramps of left lower extremity   . Diabetes mellitus without complication ([place])    borderline-diet and exercise;takes Cinnamon daily  . Esophageal reflux    takes Nexium daily and Dexilant daily as needed  . Fibromyalgia   . Fibromyalgia   . Headache    occasionally  . HOH (hard of hearing)   . Hyperlipidemia    takes CO Q10 daily  . Hypothyroidism   . Insomnia    takes Ambien and Trazodone nightly  . Joint pain   . Joint swelling   . Macular degeneration, right eye    wet and to get injection tomorrow  . Pneumonia    hx of x 3 with most recent one 2009  . Seasonal allergies    takes Zyrtec daily as well as using Nasonex  . Shortness of breath dyspnea   . Shoulder fracture   . Unspecified essential hypertension    takes Amlodipine and Losartan daily  . URI (upper respiratory infection)    was on Prednisone and Antibiotic-to complete today  . Urinary urgency     PAST SURGICAL HISTORY: Past Surgical History:  Procedure Laterality Date  . BREAST SURGERY  1972   nodules removed and benign  . CATARACT EXTRACTION W/PHACO Left 12/31/2014   Procedure: CATARACT EXTRACTION PHACO AND INTRAOCULAR LENS PLACEMENT (IOC);  Surgeon: Estill Cotta, MD;  Location: [facility] ORS;  Service: Ophthalmology;  Laterality: Left;  [place]  01:00 AP% 24.0 CDE 25.53 fluid pack # [id number]  . CATARACT EXTRACTION W/PHACO Right 04/27/2017   Procedure: CATARACT EXTRACTION PHACO AND INTRAOCULAR LENS PLACEMENT (IOC);  Surgeon: Birder Robson, MD;  Location: [facility] ORS;  Service: Ophthalmology;  Laterality: Right;  [place] 00:46.3 AP% 12.7 CDE 5.86 Fluid Pack lot # [id number]  . JOINT REPLACEMENT     SHOULDER  . REVERSE SHOULDER ARTHROPLASTY Left 04/19/2014   Procedure:  REVERSE TOTAL SHOULDER ARTHROPLASTY;  Surgeon: Marin Shutter, MD;   Location: [facility];  Service: Orthopedics;  Laterality: Left;    FAMILY HISTORY: Family History  Problem Relation Age of Onset  . Heart disease Unknown   . Breast cancer Neg Hx     ADVANCED DIRECTIVES (Y/N):  N  HEALTH MAINTENANCE: Social History   Tobacco Use  . Smoking status: Former Smoker    Packs/day: 2.00    Years: 30.00    Pack years: 60.00    Types: Cigarettes    Last attempt to quit: 02/17/2004    Years since quitting: 13.4  . Smokeless tobacco: Never Used  . Tobacco comment: quit smoking in 2006  Substance Use Topics  . Alcohol use: No    Alcohol/week: 0.0 oz  . Drug use: No     Colonoscopy:  PAP:  Bone density:  Lipid panel:  Allergies  Allergen Reactions  . Protonix [Pantoprazole Sodium] Other (See Comments)    abd cramps  . Prednisone Other (See Comments)    Agitation, insomina, mood change. Tolerates injection- not PO  . Statins Other (See Comments)    Legs hurt    Current Outpatient Medications  Medication Sig Dispense Refill  . acetaminophen (TYLENOL) 500 MG tablet Take 500 mg by mouth every 6 (six) hours as needed for moderate pain or headache.     . albuterol (PROAIR HFA) 108 (90 BASE) MCG/ACT inhaler Inhale 2 puffs into the lungs every 4 (four) hours as needed. (Patient taking differently: Inhale 2 puffs into the lungs every 4 (four) hours as needed for wheezing or shortness of breath. ) 1 Inhaler 3  . albuterol (PROVENTIL) (2.5 MG/3ML) 0.083% nebulizer solution Take 2.5 mg by nebulization 2 (two) times daily as needed for wheezing or shortness of breath.     Marland Kitchen amLODipine (NORVASC) 5 MG tablet Take 5 mg by mouth daily.    . Bevacizumab (AVASTIN IV) Place 1 Dose into both eyes every 30 (thirty) days.     . cetirizine (ZYRTEC) 10 MG tablet Take 10 mg by mouth at bedtime.     . cholecalciferol (VITAMIN D) 1000 UNITS tablet Take 1,000 Units by mouth daily.    . clotrimazole (LOTRIMIN) 1 % cream Apply 1 application topically 2 (two) times daily as  needed (for yeast infection).    . Coenzyme Q10 (CO Q 10) 10 MG CAPS Take 10 mg by mouth daily.     Marland Kitchen dextromethorphan-guaiFENesin (MUCINEX DM) 30-600 MG per 12 hr tablet Take 1 tablet by mouth every 12 (twelve) hours as needed for cough.     . diazepam (VALIUM) 5 MG tablet Take 0.5-1 tablets (2.5-5 mg total) by mouth every 6 (six) hours as needed for muscle spasms or sedation. (Patient taking differently: Take 5 mg by mouth at bedtime. ) 40 tablet 1  . diclofenac (VOLTAREN) 75 MG EC tablet Take 75 mg by mouth 2 (two) times daily.      . DULoxetine (CYMBALTA) 60 MG capsule Take 60 mg by mouth daily as needed (for pain).     Marland Kitchen  esomeprazole (NEXIUM) 40 MG capsule Take 40 mg by mouth daily as needed (for acid reflux).     . famotidine (PEPCID) 20 MG tablet Take 1 tablet (20 mg total) by mouth 2 (two) times daily. When coughing (Patient taking differently: Take 20 mg by mouth 2 (two) times daily as needed (for coughing). ) 60 tablet 6  . Homeopathic Products ([employer] LEG CRAMPS PAIN RELIEF PO) Take 2 tablets by mouth every 4 (four) hours as needed (for leg cramps).     Marland Kitchen ipratropium (ATROVENT) 0.02 % nebulizer solution Take 0.5 mg by nebulization 2 (two) times daily as needed for wheezing or shortness of breath.     . levothyroxine (SYNTHROID, LEVOTHROID) 25 MCG tablet Take 25 mcg by mouth daily before breakfast.    . lidocaine-prilocaine (EMLA) cream Apply to affected area once 30 g 3  . losartan (COZAAR) 100 MG tablet Take 100 mg by mouth daily.     . Magnesium 400 MG CAPS Take 400 mg by mouth See admin instructions. Take 400 mg by mouth in the morning every day and take 400 mg by mouth at bedtime 3 times weekly    . metoprolol succinate (TOPROL-XL) 25 MG 24 hr tablet Take 25 mg by mouth daily.    . mometasone (NASONEX) 50 MCG/ACT nasal spray Place 1 spray into the nose daily. 17 g 11  . Multiple Vitamins-Minerals (ICAPS PO) Take 1 capsule by mouth daily.    Marland Kitchen nystatin (MYCOSTATIN) 100000 UNIT/ML  suspension Take 5 mLs (500,000 Units total) by mouth as needed. (Patient taking differently: Take 5 mLs by mouth daily as needed (for mouth due to inhaler). ) 60 mL 5  . Polyethyl Glycol-Propyl Glycol (SYSTANE OP) Place 1 drop into both eyes 3 (three) times daily as needed (for dry eyes).    . SYMBICORT 160-4.5 MCG/ACT inhaler Inhale 2 puffs into the lungs 2 (two) times daily. 30.6 Inhaler 2  . vitamin C (ASCORBIC ACID) 500 MG tablet Take 500 mg by mouth daily.    . ondansetron (ZOFRAN) 8 MG tablet Take 1 tablet (8 mg total) by mouth 2 (two) times daily as needed. (Patient not taking: Reported on 08/05/2017) 30 tablet 2  . prochlorperazine (COMPAZINE) 10 MG tablet Take 1 tablet (10 mg total) by mouth every 6 (six) hours as needed (Nausea or vomiting). (Patient not taking: Reported on 08/05/2017) 60 tablet 2   No current facility-administered medications for this visit.     OBJECTIVE: Vitals:   08/05/17 0927 08/05/17 0931  BP: (!) 149/80   Pulse: 69   Resp: 16   Temp:  98.2 F (36.8 C)     Body mass index is 31.72 kg/m.    ECOG FS:0 - Asymptomatic  General: Well-developed, well-nourished, no acute distress. Eyes: Pink conjunctiva, anicteric sclera. Breast: Easily palpable mass left breast with notable skin ulceration and ecchymosis. Lungs: Clear to auscultation bilaterally. Heart: Regular rate and rhythm. No rubs, murmurs, or gallops. Abdomen: Soft, nontender, nondistended. No organomegaly noted, normoactive bowel sounds. Musculoskeletal: No edema, cyanosis, or clubbing. Neuro: Alert, answering all questions appropriately. Cranial nerves grossly intact. Skin: No rashes or petechiae noted. Psych: Normal affect.  LAB RESULTS:  Lab Results  Component Value Date   NA 140 04/10/2014   K 4.1 04/10/2014   CL 104 04/10/2014   CO2 25 04/10/2014   GLUCOSE 107 (H) 04/10/2014   BUN 17 04/10/2014   CREATININE 0.61 04/10/2014   CALCIUM 9.0 04/10/2014   GFRNONAA 87 (L) 04/10/2014  GFRAA  >90 04/10/2014    Lab Results  Component Value Date   WBC 10.0 04/10/2014   HGB 14.4 04/10/2014   HCT 43.4 04/10/2014   MCV 87.1 04/10/2014   PLT 353 04/10/2014     STUDIES: Mm Clip Placement Left  Result Date: 07/21/2017 CLINICAL DATA:  Status post ultrasound-guided core needle biopsy of the recently demonstrated 3.3 cm mass in the 5 o'clock position of the left breast. EXAM: DIAGNOSTIC LEFT MAMMOGRAM POST ULTRASOUND BIOPSY COMPARISON:  Previous exam(s). FINDINGS: Mammographic images were obtained following ultrasound guided biopsy of the recently demonstrated 3.3 cm mass in the 5 o'clock position of the left breast. These demonstrate a heart shaped biopsy marker clip within the biopsied mass. IMPRESSION: Appropriate clip deployment following left breast ultrasound-guided core needle biopsy. Final Assessment: Post Procedure Mammograms for Marker Placement Electronically Signed   By: Claudie Revering M.D.   On: 07/21/2017 12:15   [place] Lt Breast Bx W Loc Dev 1st Lesion Img Bx Spec US Guide  Addendum Date: 07/22/2017   ADDENDUM REPORT: 07/22/2017 13:24 ADDENDUM: Pathology results: Pathology results from the ultrasound-guided biopsy of the mass in the left breast at the 5 o'clock position revealed invasive mammary carcinoma, grade 1. This is concordant with the imaging findings. The patient has been notified of the results. Other than soreness, she is doing well and denies any biopsy site complications. Referral to an oncologist is recommended and this will be made by the breast center navigator. The patient has been instructed to call the [facility] with any questions or concerns. Electronically Signed   By: Everlean Alstrom M.D.   On: 07/22/2017 13:24   Result Date: 07/22/2017 CLINICAL DATA:  3.3 cm ulcerated mass with imaging features highly suspicious for malignancy in the 5 o'clock position of the left breast. EXAM: ULTRASOUND GUIDED LEFT BREAST CORE NEEDLE BIOPSY COMPARISON:  Previous exam(s).  FINDINGS: I met with the patient and we discussed the procedure of ultrasound-guided biopsy, including benefits and alternatives. We discussed the high likelihood of a successful procedure. We discussed the risks of the procedure, including infection, bleeding, tissue injury, clip migration, and inadequate sampling. Informed written consent was given. The usual time-out protocol was performed immediately prior to the procedure. Lesion quadrant: Lower outer quadrant Using sterile technique and 1% Lidocaine as local anesthetic, under direct ultrasound visualization, a 12 gauge spring-loaded device was used to perform biopsy of the recently demonstrated 3.3 cm mass in the 5 o'clock position of the left breast using a lateral approach. At the conclusion of the procedure a heart shaped tissue marker clip was deployed into the biopsy cavity. Follow up 2 view mammogram was performed and dictated separately. IMPRESSION: Ultrasound guided biopsy of the recently demonstrated 3.3 cm mass in the 5 o'clock position of the left breast. No apparent complications. Electronically Signed: By: Claudie Revering M.D. 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  PLAN:   1.  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  Patient has agreed to undergo mastectomy prior to any treatment.  We will send Oncotype DX to assess whether adjuvant chemotherapy is even needed.  At the conclusion of all of her treatments she will take an  aromatase inhibitor for a total of 5 years.  Patient will return to clinic 1 to 2 days after her surgery to discuss her final pathology results and any treatment planning  necessary.  I spent a total of 30 minutes face-to-face with the patient of which greater than 50% of the visit was spent in counseling and coordination of care as summarized above.  Patient expressed understanding and was in agreement with this plan. She also understands that She can call clinic at any time with any questions, concerns, or complaints.   Cancer Staging Primary cancer of lower outer quadrant of left female breast [facility]) Staging form: Breast, AJCC 8th Edition - Clinical stage from 07/27/2017: Stage IIIB (cT4b, cN0, cM0, G1, ER+, PR+, HER2-) - Signed by Lloyd Huger, MD on 07/27/2017   Lloyd Huger, MD   08/08/2017 11:26 PM

## 2017-08-10 ENCOUNTER — Other Ambulatory Visit: Payer: Self-pay | Admitting: General Surgery

## 2017-08-10 DIAGNOSIS — C50512 Malignant neoplasm of lower-outer quadrant of left female breast: Secondary | ICD-10-CM

## 2017-08-10 DIAGNOSIS — H353221 Exudative age-related macular degeneration, left eye, with active choroidal neovascularization: Secondary | ICD-10-CM | POA: Diagnosis not present

## 2017-08-11 ENCOUNTER — Ambulatory Visit: Payer: PPO

## 2017-08-11 ENCOUNTER — Ambulatory Visit: Payer: PPO | Admitting: Oncology

## 2017-08-11 ENCOUNTER — Other Ambulatory Visit: Payer: PPO

## 2017-08-11 ENCOUNTER — Ambulatory Visit: Payer: Self-pay | Admitting: General Surgery

## 2017-08-11 ENCOUNTER — Encounter
Admission: RE | Admit: 2017-08-11 | Discharge: 2017-08-11 | Disposition: A | Discharge status: Home / Self Care | Payer: PPO | Source: Ambulatory Visit | Attending: General Surgery | Admitting: General Surgery

## 2017-08-11 ENCOUNTER — Other Ambulatory Visit: Payer: Self-pay

## 2017-08-11 DIAGNOSIS — C50512 Malignant neoplasm of lower-outer quadrant of left female breast: Secondary | ICD-10-CM | POA: Diagnosis not present

## 2017-08-11 DIAGNOSIS — Z170 Estrogen receptor positive status [ER+]: Secondary | ICD-10-CM | POA: Diagnosis not present

## 2017-08-11 NOTE — Patient Instructions (Signed)
Your procedure is scheduled on: 08/18/17 Report to [facility] 0830 .  Remember: Instructions that are not followed completely may result in serious medical risk,  up to and including death, or upon the discretion of your surgeon and anesthesiologist your  surgery may need to be rescheduled.     _X__ 1. Do not eat food after midnight the night before your procedure.                 No gum chewing or hard candies. You may drink clear liquids up to 2 hours                 before you are scheduled to arrive for your surgery- DO not drink clear                 liquids within 2 hours of the start of your surgery.                 Clear Liquids include:  water, apple juice without pulp, clear carbohydrate                 drink such as Clearfast of Gartorade, Black Coffee or Tea (Do not add                 anything to coffee or tea).  __X__2.  On the morning of surgery brush your teeth with toothpaste and water, you                may rinse your mouth with mouthwash if you wish.  Do not swallow any toothpaste of mouthwash.     _X__ 3.  No Alcohol for 24 hours before or after surgery.   _X__ 4.  Do Not Smoke or use e-cigarettes For 24 Hours Prior to Your Surgery.                 Do not use any chewable tobacco products for at least 6 hours prior to                 surgery.  ____  5.  Bring all medications with you on the day of surgery if instructed.   ____  6.  Notify your doctor if there is any change in your medical condition      (cold, fever, infections).     Do not wear jewelry, make-up, hairpins, clips or nail polish. Do not wear lotions, powders, or perfumes. You may wear deodorant. Do not shave 48 hours prior to surgery. Men may shave face and neck. Do not bring valuables to the hospital.    [facility] is not responsible for any belongings or valuables.  Contacts, dentures or bridgework may not be worn into surgery. Leave your suitcase in the car.  After surgery it may be brought to your room. For patients admitted to the hospital, discharge time is determined by your treatment team.   Patients discharged the day of surgery will not be allowed to drive home.   _X___ Take these medicines the morning of surgery with A SIP OF WATER:    1. DULOXETINE  2. AMLODIPINE   3. NEXIUM AT BEDTIME 08/17/17 AND AM OF SURGERY  4. LEVOTHYROXINE  5. METOPROLOL  6. MAGNESIUM              7. VALIUM  ____ Fleet Enema (as directed)   ____ Use CHG Soap as directed  ___X_ Use inhalers on the day of surgery  DO NEBULIZER TREATMENT AM OF SURGERY AND BRING INHALERS ____ Stop metformin 2 days prior to surgery    ____ Take 1/2 of usual insulin dose the night before surgery. No insulin the morning          of surgery.   __X__ Stop Coumadin/Plavix/aspirin on    NO ASPIRIN FOR 5 DAYS BEFORE SURGERY  _X___ Stop Anti-inflammatories on  STOP VOLTAREN 2 DAYS BEFORE SURGERY _X_ Stop supplements until after surgery.    ____ Bring C-Pap to the hospital.

## 2017-08-11 NOTE — H&P (Signed)
PATIENT PROFILE: Kristin Estrada is a 77 y.o. female who presents to the Clinic for consultation at the request of Dr. Cintron Diaz for evaluation of breast cancer.  PCP:  Adamo, Elena Marie, MD  HISTORY OF PRESENT ILLNESS: Ms. Estrada reports that two months ago she felt pain on her left breast while sleeping and felt a skin lesion. She thought that it was an insect bite and tried "to let the body absorb it". Two month later she went to the PCP for cholesterol check up and notified the PCP about the skin lesion. The PCP ordered a mammogram and ultrasound of the breast which was done on 07/09/17 and patient was found with a 3 cm mass. The mass was biopsied on 07/21/17 and found with invasive mammary carcinoma. Patient was evaluated by Dr. Finnegan on 07/27/17 who due to the size of the lesion and the ulceration, recommended to start with neoadjuvant chemotherapy.   After discussion on Breast Tumor Board, it was decided that patient will not benefit of neoadjuvant chemotherapy and it was decided to proceed with surgery upfront.   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  That is, the more sensitive the epithelium damaged for virus, the more the cough, the more the secondary reflux (especially in those prone to reflux) the more the irritation of the sensitive mucosa and so on in a cyclical pattern.  - since can't tol nexium will offer combination of generic protonix and hs pepcid  See instructions for specific recommendations which were reviewed directly with the patient who was given a copy with highlighter outlining the key components.      Obstructive chronic bronchitis without exacerbation , unspecified (CMS-HCC) 11/02/2006   Overview    PFTs 11/2005 FEV1  1.40 (67%) ratio 61 PFTs 01/30/2014>> FeV1 60%  FeV1 78%   FeV1/FVC 67%    Last Assessment & Plan:  Respiratory infection versus flare of her allergies. She not wheezing she does not have productive cough. I do not believe this is an exacerbation of COPD at this time.. Based on Her history she is at risk for a flare. We discussed the signs and symptoms of an acute exacerbation today and I wrote for prescriptions for prednisone and doxycycline for her to take if these evolve. I gave her prescription for her Tussionex at   her request         GENERAL REVIEW OF SYSTEMS:   General ROS: negative for - chills, fatigue, fever, weight gain or weight loss Allergy and Immunology ROS: negative for - hives  Hematological and Lymphatic ROS: Positive for - bleeding problems or bruising, negative for palpable nodes Endocrine ROS: negative for - heat or cold intolerance, hair changes Respiratory ROS: negative for - cough. Positive for shortness of breath or wheezing Cardiovascular ROS: no chest pain or palpitations GI ROS: negative for  nausea, vomiting, abdominal pain, diarrhea, constipation Musculoskeletal ROS: Positive for - joint swelling or muscle pain Neurological ROS: negative for - confusion, syncope Dermatological ROS: negative for pruritus and rash Psychiatric: negative for anxiety, depression. Positive for difficulty sleeping and memory loss  MEDICATIONS: Current Outpatient Medications  Medication Sig Dispense Refill  . acetaminophen (TYLENOL) 500 MG tablet Take 500 mg by mouth every 6 (six) hours      . albuterol (PROAIR HFA) 90 mcg/actuation inhaler Inhale 2 inhalations into the lungs every 6 (six) hours as needed      . amLODIPine (NORVASC) 5 MG tablet Take 5 mg by mouth once daily      . ascorbic acid, vitamin C, (VITAMIN C) 500 MG tablet Take 500 mg by mouth once daily      . bevacizumab (AVASTIN) 25 mg/mL intralesional injection Apply to eye    . budesonide-formoterol (SYMBICORT) 160-4.5 mcg/actuation inhaler Inhale 2 puffs into the lungs 2 (two) times daily.    . cetirizine (ZYRTEC) 10 MG tablet Take 10 mg by mouth once daily      . cholecalciferol (CHOLECALCIFEROL) 1,000 unit tablet Take 1,000 Units by mouth once daily      . coenzyme Q10 10 mg capsule Take 10 mg by mouth once daily    . dextromethorphan-guaifenesin (MUCINEX DM) 30-600 mg ER tablet Take 1 tablet by mouth every 12 (twelve) hours    . diazePAM (VALIUM) 5 MG tablet Take 5 mg by mouth as needed      . diclofenac (VOLTAREN) 75 MG EC tablet Take 75 mg by mouth 2 (two) times daily    . FREESTYLE LITE STRIPS test strip once daily      . ipratropium (ATROVENT) 0.02 % nebulizer solution Inhale 0.02 mcg into the lungs as needed    . levothyroxine (SYNTHROID, LEVOTHROID) 25 MCG tablet Take 25 mcg by mouth every morning before breakfast    . losartan (COZAAR) 100 MG tablet Take 100 mg by mouth once daily    . magnesium oxide 400 mg magnesium Take 400 mg by mouth 2 (two) times daily    . metoprolol succinate (TOPROL-XL) 25 MG XL tablet Take 25 mg by  mouth once daily    . mometasone (NASONEX) 50 mcg/actuation nasal spray 1 spray by Nasal route once daily    . nystatin (MYCOSTATIN) 100,000 unit/mL suspension Take 5 mLs by mouth as needed     No current facility-administered medications for this visit.     ALLERGIES: Pantoprazole sodium and Statins-hmg-coa reductase inhibitors  PAST MEDICAL HISTORY: Past Medical History:  Diagnosis Date  . Arthritis   . Breast cancer , unspecified (CMS-HCC) 2019  . COPD (chronic obstructive pulmonary disease) , unspecified (CMS-HCC)   . Fibromyalgia   . GERD (gastroesophageal reflux disease)   . Hypertension   . Thyroid disease     PAST SURGICAL HISTORY: Past Surgical History:  Procedure Laterality Date  . CYSTECTOMY Bilateral 1972   both breast  . TOTAL   SHOULDER REPLACEMENT Left 2016     FAMILY HISTORY: Family History  Problem Relation Age of Onset  . Coronary Artery Disease (Blocked arteries around heart) Mother   . Parkinsonism Father   . Coronary Artery Disease (Blocked arteries around heart) Sister   . Prostate cancer Paternal Uncle      SOCIAL HISTORY: Social History   Socioeconomic History  . Marital status: Widowed    Spouse name: Not on file  . Number of children: Not on file  . Years of education: Not on file  . Highest education level: Not on file  Occupational History  . Occupation: retired  Scientific laboratory technician  . Financial resource strain: Not on file  . Food insecurity:    Worry: Not on file    Inability: Not on file  . Transportation needs:    Medical: Not on file    Non-medical: Not on file  Tobacco Use  . Smoking status: Former Smoker    Last attempt to quit: 2006    Years since quitting: 13.4  . Smokeless tobacco: Never Used  Substance and Sexual Activity  . Alcohol use: Never    Frequency: Never  . Drug use: Never  . Sexual activity: Not on file  Other Topics Concern  . Not on file  Social History Narrative  . Not on file    PHYSICAL  EXAM: Vitals:   08/11/17 1015  BP: 179/86  Pulse: 79   Body mass index is 28.96 kg/m. Weight: 78.9 kg (174 lb)   GENERAL: Alert, active, oriented x3  HEENT: Pupils equal reactive to light. Extraocular movements are intact. Sclera clear. Palpebral conjunctiva normal red color.Pharynx clear.  NECK: Supple with no palpable mass and no adenopathy.  LUNGS: Sound clear with no rales rhonchi or wheezes.  HEART: Regular rhythm S1 and S2 without murmur.  BREAST: right breast normal without mass, skin or nipple changes or axillary nodes. There is an abnormal mass palpable on the left breast with skin ulceration and nipple retraction. There is no axillary lymph nodes palpable clinically.  ABDOMEN: Soft and depressible, nontender with no palpable mass, no hepatomegaly.  EXTREMITIES: Well-developed well-nourished symmetrical with no dependent edema.  NEUROLOGICAL: Awake alert oriented, facial expression symmetrical, moving all extremities.  REVIEW OF DATA: I have reviewed the following data today: Initial consult on 07/28/2017  Component Date Value  . WBC (White Blood Cell Co* 07/28/2017 10.2   . RBC (Red Blood Cell Coun* 07/28/2017 4.72   . Hemoglobin 07/28/2017 13.8   . Hematocrit 07/28/2017 43.2   . MCV (Mean Corpuscular Vo* 07/28/2017 91.5   . MCH (Mean Corpuscular He* 07/28/2017 29.2   . MCHC (Mean Corpuscular H* 07/28/2017 31.9*  . Platelet Count 07/28/2017 362   . RDW-CV (Red Cell Distrib* 07/28/2017 14.7   . MPV (Mean Platelet Volum* 07/28/2017 10.4   . Neutrophils 07/28/2017 5.81   . Lymphocytes 07/28/2017 2.97   . Monocytes 07/28/2017 0.98   . Eosinophils 07/28/2017 0.26   . Basophils 07/28/2017 0.10*  . Neutrophil % 07/28/2017 57.2   . Lymphocyte % 07/28/2017 29.2   . Monocyte % 07/28/2017 9.6   . Eosinophil % 07/28/2017 2.6   . Basophil% 07/28/2017 1.0   . Immature Granulocyte % 07/28/2017 0.4   . Immature Granulocyte Cou* 07/28/2017 0.04   . Glucose  07/28/2017 108   . Sodium 07/28/2017 141   . Potassium 07/28/2017 4.3   . Chloride 07/28/2017 103   . Carbon Dioxide (CO2) 07/28/2017 30.8   .  Urea Nitrogen (BUN) 07/28/2017 23   . Creatinine 07/28/2017 0.7   . Glomerular Filtration Ra* 07/28/2017 81   . Calcium 07/28/2017 9.7   . AST  07/28/2017 17   . ALT  07/28/2017 17   . Alk Phos (alkaline Phosp* 07/28/2017 66   . Albumin 07/28/2017 4.4   . Bilirubin, Total 07/28/2017 0.5   . Protein, Total 07/28/2017 7.1   . A/G Ratio 07/28/2017 1.6      ASSESSMENT: Ms. Sparkman is a 77 y.o. 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    Patient was oriented again about the pathology results. Surgical alternatives were discussed with patient including partial vs total mastectomy. Surgical technique and post operative care was discussed with patient. Risk of surgery was discussed with patient including but not limited to: wound infection, seroma, hematoma, brachial plexopathy, mondor's disease (thrombosis of small veins of breast), chronic wound pain, breast lymphedema, altered sensation to the nipple and cosmesis among others.   After a long discussion in the tumor board with multiple Oncologist, Pathologist and surgeons, it was decided that the patient will not benefit of neoadjuvant chemotherapy and she may even not benefit of chemotherapy at all with the current biopsy findings. It was decided that the patient should have surgery first, and with the final pathology results and Oncotype Dx evaluation, categorize the patient of her risk to see if she will benefit of adjuvant chemotherapy. All this was discussed with patient and she understood why the plan was changed from a neoadjuvant chemotherapy approach to a surgery first approach.   Due to the size of the mass, she is not a candidate for partial mastectomy and this was discussed with patient.  Patient agrees to undergo left simple mastectomy with sentinel lymph node biopsy and the possibility of complete axillary node dissection (modified radical mastectomy). Patient oriented that due to her skin ulcer the surgery will be more complicated regarding the inicial incision, the dissection making sure that the skin that is left is not affected with the cancer.    PLAN: 1. Left breast simple mastectomy (57846) with left axillary sentinel lymph node biopsy (96295) 2. CBC, CMP done on 07/28/17 3. Internal Medicine clearance 4. Contact the clinic if has any concern of question 5. Stop Voltaren 2 days before surgery 6. Avoid aspirin 5 days before surgery.   Patient verbalized understanding, all questions were answered, and were agreeable with the plan outlined above.   [name], MD  Electronically signed by [name], MD

## 2017-08-11 NOTE — H&P (View-Only) (Signed)
PATIENT PROFILE: Kristin Estrada is a 77 y.o. female who presents to the Clinic for consultation at the request of Dr. Windell Moment for evaluation of breast cancer.  PCP:  Magnus Sinning, MD  HISTORY OF PRESENT ILLNESS: Kristin Estrada reports that two months ago she felt pain on her left breast while sleeping and felt a skin lesion. She thought that it was an insect bite and tried "to let the body absorb it". Two month later she went to the PCP for cholesterol check up and notified the PCP about the skin lesion. The PCP ordered a mammogram and ultrasound of the breast which was done on 07/09/17 and patient was found with a 3 cm mass. The mass was biopsied on 07/21/17 and found with invasive mammary carcinoma. Patient was evaluated by Dr. Grayland Ormond on 07/27/17 who due to the size of the lesion and the ulceration, recommended to start with neoadjuvant chemotherapy.   After discussion on Breast Tumor Board, it was decided that patient will not benefit of neoadjuvant chemotherapy and it was decided to proceed with surgery upfront.   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  That is, the more sensitive the epithelium damaged for virus, the more the cough, the more the secondary reflux (especially in those prone to reflux) the more the irritation of the sensitive mucosa and so on in a cyclical pattern.  - since can't tol nexium will offer combination of generic protonix and hs pepcid  See instructions for specific recommendations which were reviewed directly with the patient who was given a copy with highlighter outlining the key components.      Obstructive chronic bronchitis without exacerbation , unspecified (CMS-HCC) 11/02/2006   Overview    PFTs 11/2005 FEV1  1.40 (67%) ratio 61 PFTs 01/30/2014>> FeV1 60%  FeV1 78%   FeV1/FVC 67%    Last Assessment & Plan:  Respiratory infection versus flare of her allergies. She not wheezing she does not have productive cough. I do not believe this is an exacerbation of COPD at this time.. Based on Her history she is at risk for a flare. We discussed the signs and symptoms of an acute exacerbation today and I wrote for prescriptions for prednisone and doxycycline for her to take if these evolve. I gave her prescription for her Tussionex at  her request         GENERAL REVIEW OF SYSTEMS:   General ROS: negative for - chills, fatigue, fever, weight gain or weight loss Allergy and Immunology ROS: negative for - hives  Hematological and Lymphatic ROS: Positive for - bleeding problems or bruising, negative for palpable nodes Endocrine ROS: negative for - heat or cold intolerance, hair changes Respiratory ROS: negative for - cough. Positive for shortness of breath or wheezing Cardiovascular ROS: no chest pain or palpitations GI ROS: negative for  nausea, vomiting, abdominal pain, diarrhea, constipation Musculoskeletal ROS: Positive for - joint swelling or muscle pain Neurological ROS: negative for - confusion, syncope Dermatological ROS: negative for pruritus and rash Psychiatric: negative for anxiety, depression. Positive for difficulty sleeping and memory loss  MEDICATIONS: Current Outpatient Medications  Medication Sig Dispense Refill  . acetaminophen (TYLENOL) 500 MG tablet Take 500 mg by mouth every 6 (six) hours      . albuterol (PROAIR HFA) 90 mcg/actuation inhaler Inhale 2 inhalations into the lungs every 6 (six) hours as needed      . amLODIPine (NORVASC) 5 MG tablet Take 5 mg by mouth once daily      . ascorbic acid, vitamin C, (VITAMIN C) 500 MG tablet Take 500 mg by mouth once daily      . bevacizumab (AVASTIN) 25 mg/mL intralesional injection Apply to eye    . budesonide-formoterol (SYMBICORT) 160-4.5 mcg/actuation inhaler Inhale 2 puffs into the lungs 2 (two) times daily.    . cetirizine (ZYRTEC) 10 MG tablet Take 10 mg by mouth once daily      . cholecalciferol (CHOLECALCIFEROL) 1,000 unit tablet Take 1,000 Units by mouth once daily      . coenzyme Q10 10 mg capsule Take 10 mg by mouth once daily    . dextromethorphan-guaifenesin (MUCINEX DM) 30-600 mg ER tablet Take 1 tablet by mouth every 12 (twelve) hours    . diazePAM (VALIUM) 5 MG tablet Take 5 mg by mouth as needed      . diclofenac (VOLTAREN) 75 MG EC tablet Take 75 mg by mouth 2 (two) times daily    . FREESTYLE LITE STRIPS test strip once daily      . ipratropium (ATROVENT) 0.02 % nebulizer solution Inhale 0.02 mcg into the lungs as needed    . levothyroxine (SYNTHROID, LEVOTHROID) 25 MCG tablet Take 25 mcg by mouth every morning before breakfast    . losartan (COZAAR) 100 MG tablet Take 100 mg by mouth once daily    . magnesium oxide 400 mg magnesium Take 400 mg by mouth 2 (two) times daily    . metoprolol succinate (TOPROL-XL) 25 MG XL tablet Take 25 mg by  mouth once daily    . mometasone (NASONEX) 50 mcg/actuation nasal spray 1 spray by Nasal route once daily    . nystatin (MYCOSTATIN) 100,000 unit/mL suspension Take 5 mLs by mouth as needed     No current facility-administered medications for this visit.     ALLERGIES: Pantoprazole sodium and Statins-hmg-coa reductase inhibitors  PAST MEDICAL HISTORY: Past Medical History:  Diagnosis Date  . Arthritis   . Breast cancer , unspecified (CMS-HCC) 2019  . COPD (chronic obstructive pulmonary disease) , unspecified (CMS-HCC)   . Fibromyalgia   . GERD (gastroesophageal reflux disease)   . Hypertension   . Thyroid disease     PAST SURGICAL HISTORY: Past Surgical History:  Procedure Laterality Date  . CYSTECTOMY Bilateral 1972   both breast  . TOTAL  SHOULDER REPLACEMENT Left 2016     FAMILY HISTORY: Family History  Problem Relation Age of Onset  . Coronary Artery Disease (Blocked arteries around heart) Mother   . Parkinsonism Father   . Coronary Artery Disease (Blocked arteries around heart) Sister   . Prostate cancer Paternal Uncle      SOCIAL HISTORY: Social History   Socioeconomic History  . Marital status: Widowed    Spouse name: Not on file  . Number of children: Not on file  . Years of education: Not on file  . Highest education level: Not on file  Occupational History  . Occupation: retired  Scientific laboratory technician  . Financial resource strain: Not on file  . Food insecurity:    Worry: Not on file    Inability: Not on file  . Transportation needs:    Medical: Not on file    Non-medical: Not on file  Tobacco Use  . Smoking status: Former Smoker    Last attempt to quit: 2006    Years since quitting: 13.4  . Smokeless tobacco: Never Used  Substance and Sexual Activity  . Alcohol use: Never    Frequency: Never  . Drug use: Never  . Sexual activity: Not on file  Other Topics Concern  . Not on file  Social History Narrative  . Not on file    PHYSICAL  EXAM: Vitals:   08/11/17 1015  BP: 179/86  Pulse: 79   Body mass index is 28.96 kg/m. Weight: 78.9 kg (174 lb)   GENERAL: Alert, active, oriented x3  HEENT: Pupils equal reactive to light. Extraocular movements are intact. Sclera clear. Palpebral conjunctiva normal red color.Pharynx clear.  NECK: Supple with no palpable mass and no adenopathy.  LUNGS: Sound clear with no rales rhonchi or wheezes.  HEART: Regular rhythm S1 and S2 without murmur.  BREAST: right breast normal without mass, skin or nipple changes or axillary nodes. There is an abnormal mass palpable on the left breast with skin ulceration and nipple retraction. There is no axillary lymph nodes palpable clinically.  ABDOMEN: Soft and depressible, nontender with no palpable mass, no hepatomegaly.  EXTREMITIES: Well-developed well-nourished symmetrical with no dependent edema.  NEUROLOGICAL: Awake alert oriented, facial expression symmetrical, moving all extremities.  REVIEW OF DATA: I have reviewed the following data today: Initial consult on 07/28/2017  Component Date Value  . WBC (White Blood Cell Co* 07/28/2017 10.2   . RBC (Red Blood Cell Coun* 07/28/2017 4.72   . Hemoglobin 07/28/2017 13.8   . Hematocrit 07/28/2017 43.2   . MCV (Mean Corpuscular Vo* 07/28/2017 91.5   . MCH (Mean Corpuscular He* 07/28/2017 29.2   . MCHC (Mean Corpuscular H* 07/28/2017 31.9*  . Platelet Count 07/28/2017 362   . RDW-CV (Red Cell Distrib* 07/28/2017 14.7   . MPV (Mean Platelet Volum* 07/28/2017 10.4   . Neutrophils 07/28/2017 5.81   . Lymphocytes 07/28/2017 2.97   . Monocytes 07/28/2017 0.98   . Eosinophils 07/28/2017 0.26   . Basophils 07/28/2017 0.10*  . Neutrophil % 07/28/2017 57.2   . Lymphocyte % 07/28/2017 29.2   . Monocyte % 07/28/2017 9.6   . Eosinophil % 07/28/2017 2.6   . Basophil% 07/28/2017 1.0   . Immature Granulocyte % 07/28/2017 0.4   . Immature Granulocyte Cou* 07/28/2017 0.04   . Glucose  07/28/2017 108   . Sodium 07/28/2017 141   . Potassium 07/28/2017 4.3   . Chloride 07/28/2017 103   . Carbon Dioxide (CO2) 07/28/2017 30.8   .  Urea Nitrogen (BUN) 07/28/2017 23   . Creatinine 07/28/2017 0.7   . Glomerular Filtration Ra* 07/28/2017 81   . Calcium 07/28/2017 9.7   . AST  07/28/2017 17   . ALT  07/28/2017 17   . Alk Phos (alkaline Phosp* 07/28/2017 66   . Albumin 07/28/2017 4.4   . Bilirubin, Total 07/28/2017 0.5   . Protein, Total 07/28/2017 7.1   . A/G Ratio 07/28/2017 1.6      ASSESSMENT: Ms. Sparkman is a 77 y.o. 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    Patient was oriented again about the pathology results. Surgical alternatives were discussed with patient including partial vs total mastectomy. Surgical technique and post operative care was discussed with patient. Risk of surgery was discussed with patient including but not limited to: wound infection, seroma, hematoma, brachial plexopathy, mondor's disease (thrombosis of small veins of breast), chronic wound pain, breast lymphedema, altered sensation to the nipple and cosmesis among others.   After a long discussion in the tumor board with multiple Oncologist, Pathologist and surgeons, it was decided that the patient will not benefit of neoadjuvant chemotherapy and she may even not benefit of chemotherapy at all with the current biopsy findings. It was decided that the patient should have surgery first, and with the final pathology results and Oncotype Dx evaluation, categorize the patient of her risk to see if she will benefit of adjuvant chemotherapy. All this was discussed with patient and she understood why the plan was changed from a neoadjuvant chemotherapy approach to a surgery first approach.   Due to the size of the mass, she is not a candidate for partial mastectomy and this was discussed with patient.  Patient agrees to undergo left simple mastectomy with sentinel lymph node biopsy and the possibility of complete axillary node dissection (modified radical mastectomy). Patient oriented that due to her skin ulcer the surgery will be more complicated regarding the inicial incision, the dissection making sure that the skin that is left is not affected with the cancer.    PLAN: 1. Left breast simple mastectomy (57846) with left axillary sentinel lymph node biopsy (96295) 2. CBC, CMP done on 07/28/17 3. Internal Medicine clearance 4. Contact the clinic if has any concern of question 5. Stop Voltaren 2 days before surgery 6. Avoid aspirin 5 days before surgery.   Patient verbalized understanding, all questions were answered, and were agreeable with the plan outlined above.   [name], MD  Electronically signed by [name], MD

## 2017-08-12 ENCOUNTER — Inpatient Hospital Stay: Admission: RE | Admit: 2017-08-12 | Payer: PPO | Source: Ambulatory Visit

## 2017-08-17 MED ORDER — CEFAZOLIN SODIUM-DEXTROSE 2-4 GM/100ML-% IV SOLN
2.0000 g | INTRAVENOUS | Status: AC
  Administered 2017-08-18: 2 g via INTRAVENOUS

## 2017-08-18 ENCOUNTER — Observation Stay
Admission: RE | Admit: 2017-08-18 | Discharge: 2017-08-19 | Disposition: A | Discharge status: Home / Self Care | Payer: PPO | Source: Ambulatory Visit | Attending: General Surgery | Admitting: General Surgery

## 2017-08-18 ENCOUNTER — Ambulatory Visit
Admission: RE | Admit: 2017-08-18 | Discharge: 2017-08-18 | Disposition: A | Discharge status: Home / Self Care | Payer: PPO | Source: Ambulatory Visit | Attending: General Surgery | Admitting: General Surgery

## 2017-08-18 ENCOUNTER — Inpatient Hospital Stay: Payer: PPO | Admitting: Anesthesiology

## 2017-08-18 ENCOUNTER — Encounter
Admission: RE | Disposition: A | Discharge status: Home / Self Care | Payer: Self-pay | Source: Ambulatory Visit | Attending: General Surgery

## 2017-08-18 ENCOUNTER — Encounter: Payer: Self-pay | Admitting: Anesthesiology

## 2017-08-18 ENCOUNTER — Other Ambulatory Visit: Payer: Self-pay

## 2017-08-18 DIAGNOSIS — N611 Abscess of the breast and nipple: Secondary | ICD-10-CM | POA: Insufficient documentation

## 2017-08-18 DIAGNOSIS — Z96612 Presence of left artificial shoulder joint: Secondary | ICD-10-CM | POA: Insufficient documentation

## 2017-08-18 DIAGNOSIS — Z791 Long term (current) use of non-steroidal anti-inflammatories (NSAID): Secondary | ICD-10-CM | POA: Diagnosis not present

## 2017-08-18 DIAGNOSIS — C773 Secondary and unspecified malignant neoplasm of axilla and upper limb lymph nodes: Secondary | ICD-10-CM | POA: Diagnosis not present

## 2017-08-18 DIAGNOSIS — Z170 Estrogen receptor positive status [ER+]: Secondary | ICD-10-CM | POA: Insufficient documentation

## 2017-08-18 DIAGNOSIS — Z7951 Long term (current) use of inhaled steroids: Secondary | ICD-10-CM | POA: Insufficient documentation

## 2017-08-18 DIAGNOSIS — J449 Chronic obstructive pulmonary disease, unspecified: Secondary | ICD-10-CM | POA: Insufficient documentation

## 2017-08-18 DIAGNOSIS — Z79899 Other long term (current) drug therapy: Secondary | ICD-10-CM | POA: Insufficient documentation

## 2017-08-18 DIAGNOSIS — C50512 Malignant neoplasm of lower-outer quadrant of left female breast: Secondary | ICD-10-CM | POA: Diagnosis not present

## 2017-08-18 DIAGNOSIS — I10 Essential (primary) hypertension: Secondary | ICD-10-CM | POA: Insufficient documentation

## 2017-08-18 DIAGNOSIS — M797 Fibromyalgia: Secondary | ICD-10-CM | POA: Diagnosis not present

## 2017-08-18 DIAGNOSIS — C50912 Malignant neoplasm of unspecified site of left female breast: Secondary | ICD-10-CM | POA: Diagnosis not present

## 2017-08-18 DIAGNOSIS — Z8249 Family history of ischemic heart disease and other diseases of the circulatory system: Secondary | ICD-10-CM | POA: Diagnosis not present

## 2017-08-18 DIAGNOSIS — E119 Type 2 diabetes mellitus without complications: Secondary | ICD-10-CM | POA: Diagnosis not present

## 2017-08-18 DIAGNOSIS — C50919 Malignant neoplasm of unspecified site of unspecified female breast: Secondary | ICD-10-CM | POA: Diagnosis present

## 2017-08-18 DIAGNOSIS — K219 Gastro-esophageal reflux disease without esophagitis: Secondary | ICD-10-CM | POA: Insufficient documentation

## 2017-08-18 DIAGNOSIS — E039 Hypothyroidism, unspecified: Secondary | ICD-10-CM | POA: Diagnosis not present

## 2017-08-18 DIAGNOSIS — Z79890 Hormone replacement therapy (postmenopausal): Secondary | ICD-10-CM | POA: Diagnosis not present

## 2017-08-18 DIAGNOSIS — Z87891 Personal history of nicotine dependence: Secondary | ICD-10-CM | POA: Diagnosis not present

## 2017-08-18 HISTORY — PX: MASTECTOMY W/ SENTINEL NODE BIOPSY: SHX2001

## 2017-08-18 LAB — CBC
HCT: 39.9 % (ref 35.0–47.0)
Hemoglobin: 13.2 g/dL (ref 12.0–16.0)
MCH: 29.3 pg (ref 26.0–34.0)
MCHC: 33 g/dL (ref 32.0–36.0)
MCV: 88.7 fL (ref 80.0–100.0)
Platelets: 348 10*3/uL (ref 150–440)
RBC: 4.5 MIL/uL (ref 3.80–5.20)
RDW: 15.5 % — AB (ref 11.5–14.5)
WBC: 23.8 10*3/uL — ABNORMAL HIGH (ref 3.6–11.0)

## 2017-08-18 LAB — CREATININE, SERUM
CREATININE: 0.73 mg/dL (ref 0.44–1.00)
GFR calc Af Amer: >60 mL/min (ref >60)

## 2017-08-18 SURGERY — MASTECTOMY WITH SENTINEL LYMPH NODE BIOPSY
Anesthesia: General | Site: Breast | Laterality: Left | Wound class: Clean

## 2017-08-18 MED ORDER — HYDROMORPHONE HCL 1 MG/ML IJ SOLN
INTRAMUSCULAR | Status: AC
  Filled 2017-08-18: qty 1

## 2017-08-18 MED ORDER — PHENYLEPHRINE HCL 10 MG/ML IJ SOLN
INTRAMUSCULAR | Status: AC
  Filled 2017-08-18: qty 1

## 2017-08-18 MED ORDER — ACETAMINOPHEN 10 MG/ML IV SOLN
INTRAVENOUS | Status: DC | PRN
  Administered 2017-08-18: 1000 mg via INTRAVENOUS

## 2017-08-18 MED ORDER — LIDOCAINE HCL (PF) 2 % IJ SOLN
INTRAMUSCULAR | Status: AC
  Filled 2017-08-18: qty 10

## 2017-08-18 MED ORDER — IPRATROPIUM BROMIDE 0.02 % IN SOLN: 0.5000 mg | Freq: Two times a day (BID) | RESPIRATORY_TRACT | Status: DC | PRN

## 2017-08-18 MED ORDER — ONDANSETRON 4 MG PO TBDP: 4.0000 mg | ORAL_TABLET | Freq: Four times a day (QID) | ORAL | Status: DC | PRN

## 2017-08-18 MED ORDER — LEVOTHYROXINE SODIUM 50 MCG PO TABS
25.0000 ug | ORAL_TABLET | Freq: Every day | ORAL | Status: DC
  Administered 2017-08-19: 25 ug via ORAL
  Filled 2017-08-18: qty 1

## 2017-08-18 MED ORDER — CEFAZOLIN SODIUM-DEXTROSE 2-4 GM/100ML-% IV SOLN: 2.0000 g | INTRAVENOUS | Status: DC

## 2017-08-18 MED ORDER — TECHNETIUM TC 99M SULFUR COLLOID FILTERED
1.0000 | Freq: Once | INTRAVENOUS | Status: AC | PRN
  Administered 2017-08-18: 0.769 via INTRADERMAL

## 2017-08-18 MED ORDER — GLYCOPYRROLATE 0.2 MG/ML IJ SOLN
INTRAMUSCULAR | Status: DC | PRN
  Administered 2017-08-18: 0.1 mg via INTRAVENOUS

## 2017-08-18 MED ORDER — CEFAZOLIN SODIUM-DEXTROSE 2-4 GM/100ML-% IV SOLN
INTRAVENOUS | Status: AC
  Filled 2017-08-18: qty 100

## 2017-08-18 MED ORDER — DULOXETINE HCL 30 MG PO CPEP: 60.0000 mg | ORAL_CAPSULE | Freq: Every day | ORAL | Status: DC | PRN

## 2017-08-18 MED ORDER — CO Q 10 10 MG PO CAPS: 10.0000 mg | ORAL_CAPSULE | Freq: Every day | ORAL | Status: DC

## 2017-08-18 MED ORDER — PROPOFOL 10 MG/ML IV BOLUS
INTRAVENOUS | Status: DC | PRN
  Administered 2017-08-18: 150 mg via INTRAVENOUS

## 2017-08-18 MED ORDER — FENTANYL CITRATE (PF) 100 MCG/2ML IJ SOLN
INTRAMUSCULAR | Status: AC
  Filled 2017-08-18: qty 4

## 2017-08-18 MED ORDER — PHENYLEPHRINE HCL 10 MG/ML IJ SOLN
INTRAMUSCULAR | Status: DC | PRN
  Administered 2017-08-18: 100 ug via INTRAVENOUS

## 2017-08-18 MED ORDER — DICLOFENAC SODIUM 75 MG PO TBEC
75.0000 mg | DELAYED_RELEASE_TABLET | Freq: Two times a day (BID) | ORAL | Status: DC
  Administered 2017-08-18 – 2017-08-19 (×2): 75 mg via ORAL
  Filled 2017-08-18 (×3): qty 1

## 2017-08-18 MED ORDER — FAMOTIDINE IN NACL 20-0.9 MG/50ML-% IV SOLN
20.0000 mg | Freq: Two times a day (BID) | INTRAVENOUS | Status: DC
  Administered 2017-08-18 – 2017-08-19 (×2): 20 mg via INTRAVENOUS
  Filled 2017-08-18 (×2): qty 50

## 2017-08-18 MED ORDER — VITAMIN C 500 MG PO TABS
500.0000 mg | ORAL_TABLET | Freq: Every day | ORAL | Status: DC
  Administered 2017-08-19: 500 mg via ORAL
  Filled 2017-08-18 (×2): qty 1

## 2017-08-18 MED ORDER — ENOXAPARIN SODIUM 40 MG/0.4ML ~~LOC~~ SOLN
40.0000 mg | SUBCUTANEOUS | Status: DC
  Administered 2017-08-19: 40 mg via SUBCUTANEOUS
  Filled 2017-08-18: qty 0.4

## 2017-08-18 MED ORDER — GLYCOPYRROLATE 0.2 MG/ML IJ SOLN
INTRAMUSCULAR | Status: AC
  Filled 2017-08-18: qty 1

## 2017-08-18 MED ORDER — MORPHINE SULFATE (PF) 4 MG/ML IV SOLN
4.0000 mg | INTRAVENOUS | Status: DC | PRN
  Administered 2017-08-18: 4 mg via INTRAVENOUS
  Filled 2017-08-18: qty 1

## 2017-08-18 MED ORDER — MAGNESIUM OXIDE 400 (241.3 MG) MG PO TABS
400.0000 mg | ORAL_TABLET | Freq: Every day | ORAL | Status: DC
  Administered 2017-08-18 – 2017-08-19 (×2): 400 mg via ORAL
  Filled 2017-08-18 (×2): qty 1

## 2017-08-18 MED ORDER — DEXAMETHASONE SODIUM PHOSPHATE 10 MG/ML IJ SOLN
INTRAMUSCULAR | Status: DC | PRN
  Administered 2017-08-18: 10 mg via INTRAVENOUS

## 2017-08-18 MED ORDER — FENTANYL CITRATE (PF) 100 MCG/2ML IJ SOLN: 25.0000 ug | INTRAMUSCULAR | Status: DC | PRN

## 2017-08-18 MED ORDER — LIDOCAINE HCL (CARDIAC) PF 100 MG/5ML IV SOSY
PREFILLED_SYRINGE | INTRAVENOUS | Status: DC | PRN
  Administered 2017-08-18: 60 mg via INTRAVENOUS

## 2017-08-18 MED ORDER — METHOCARBAMOL 500 MG PO TABS
500.0000 mg | ORAL_TABLET | Freq: Four times a day (QID) | ORAL | Status: DC | PRN
  Filled 2017-08-18: qty 1

## 2017-08-18 MED ORDER — ONDANSETRON HCL 4 MG/2ML IJ SOLN
INTRAMUSCULAR | Status: DC | PRN
  Administered 2017-08-18: 4 mg via INTRAVENOUS

## 2017-08-18 MED ORDER — FENTANYL CITRATE (PF) 100 MCG/2ML IJ SOLN
INTRAMUSCULAR | Status: DC | PRN
  Administered 2017-08-18 (×2): 50 ug via INTRAVENOUS
  Administered 2017-08-18: 100 ug via INTRAVENOUS

## 2017-08-18 MED ORDER — ACETAMINOPHEN 10 MG/ML IV SOLN
INTRAVENOUS | Status: AC
  Filled 2017-08-18: qty 100

## 2017-08-18 MED ORDER — VITAMIN D 1000 UNITS PO TABS
1000.0000 [IU] | ORAL_TABLET | Freq: Every day | ORAL | Status: DC
  Administered 2017-08-19: 1000 [IU] via ORAL
  Filled 2017-08-18: qty 1

## 2017-08-18 MED ORDER — METOPROLOL SUCCINATE ER 25 MG PO TB24
25.0000 mg | ORAL_TABLET | Freq: Every day | ORAL | Status: DC
  Administered 2017-08-19: 25 mg via ORAL
  Filled 2017-08-18: qty 1

## 2017-08-18 MED ORDER — DEXAMETHASONE SODIUM PHOSPHATE 10 MG/ML IJ SOLN
INTRAMUSCULAR | Status: AC
  Filled 2017-08-18: qty 1

## 2017-08-18 MED ORDER — PROPOFOL 10 MG/ML IV BOLUS
INTRAVENOUS | Status: AC
  Filled 2017-08-18: qty 20

## 2017-08-18 MED ORDER — DIAZEPAM 5 MG PO TABS
5.0000 mg | ORAL_TABLET | Freq: Every day | ORAL | Status: DC
  Administered 2017-08-18: 5 mg via ORAL
  Filled 2017-08-18: qty 1

## 2017-08-18 MED ORDER — ONDANSETRON HCL 4 MG/2ML IJ SOLN
INTRAMUSCULAR | Status: AC
  Filled 2017-08-18: qty 2

## 2017-08-18 MED ORDER — NYSTATIN 100000 UNIT/ML MT SUSP: 5.0000 mL | Freq: Every day | OROMUCOSAL | Status: DC | PRN

## 2017-08-18 MED ORDER — HYDROMORPHONE HCL 1 MG/ML IJ SOLN
INTRAMUSCULAR | Status: DC | PRN
  Administered 2017-08-18: 1 mg via INTRAVENOUS

## 2017-08-18 MED ORDER — FAMOTIDINE 20 MG PO TABS
ORAL_TABLET | ORAL | Status: AC
  Administered 2017-08-18: 20 mg
  Filled 2017-08-18: qty 1

## 2017-08-18 MED ORDER — LACTATED RINGERS IV SOLN
INTRAVENOUS | Status: DC
  Administered 2017-08-18 (×2): via INTRAVENOUS

## 2017-08-18 MED ORDER — AMLODIPINE BESYLATE 5 MG PO TABS
5.0000 mg | ORAL_TABLET | Freq: Every day | ORAL | Status: DC
  Administered 2017-08-19: 5 mg via ORAL
  Filled 2017-08-18: qty 1

## 2017-08-18 MED ORDER — LOSARTAN POTASSIUM 50 MG PO TABS
100.0000 mg | ORAL_TABLET | Freq: Every day | ORAL | Status: DC
  Administered 2017-08-18 – 2017-08-19 (×2): 100 mg via ORAL
  Filled 2017-08-18 (×2): qty 2

## 2017-08-18 MED ORDER — BUPIVACAINE-EPINEPHRINE 0.25% -1:200000 IJ SOLN
INTRAMUSCULAR | Status: DC | PRN
  Administered 2017-08-18: 30 mL

## 2017-08-18 MED ORDER — ACETAMINOPHEN 500 MG PO TABS: 500.0000 mg | ORAL_TABLET | Freq: Four times a day (QID) | ORAL | Status: DC | PRN

## 2017-08-18 MED ORDER — HYDROCODONE-ACETAMINOPHEN 5-325 MG PO TABS
1.0000 | ORAL_TABLET | ORAL | Status: DC | PRN
  Administered 2017-08-18 – 2017-08-19 (×2): 2 via ORAL
  Filled 2017-08-18 (×2): qty 2

## 2017-08-18 MED ORDER — ALBUTEROL SULFATE (2.5 MG/3ML) 0.083% IN NEBU: 3.0000 mL | INHALATION_SOLUTION | RESPIRATORY_TRACT | Status: DC | PRN

## 2017-08-18 MED ORDER — SODIUM CHLORIDE 0.9 % IV SOLN
INTRAVENOUS | Status: DC
  Administered 2017-08-18: 17:00:00 via INTRAVENOUS

## 2017-08-18 MED ORDER — ONDANSETRON HCL 4 MG/2ML IJ SOLN
4.0000 mg | Freq: Four times a day (QID) | INTRAMUSCULAR | Status: DC | PRN
  Administered 2017-08-18: 4 mg via INTRAVENOUS
  Filled 2017-08-18: qty 2

## 2017-08-18 MED ORDER — ONDANSETRON HCL 4 MG/2ML IJ SOLN: 4.0000 mg | Freq: Once | INTRAMUSCULAR | Status: DC | PRN

## 2017-08-18 SURGICAL SUPPLY — 44 items
ADH SKN CLS APL DERMABOND .7 (GAUZE/BANDAGES/DRESSINGS) ×2
BINDER BREAST LRG (GAUZE/BANDAGES/DRESSINGS) ×3 IMPLANT
BINDER BREAST MEDIUM (GAUZE/BANDAGES/DRESSINGS) ×1 IMPLANT
BINDER BREAST XLRG (GAUZE/BANDAGES/DRESSINGS) ×1 IMPLANT
BULB RESERV EVAC DRAIN JP 100C (MISCELLANEOUS) ×4 IMPLANT
CANISTER SUCT 1200ML W/VALVE (MISCELLANEOUS) ×3 IMPLANT
CHLORAPREP W/TINT 26ML (MISCELLANEOUS) ×3 IMPLANT
DERMABOND ADVANCED (GAUZE/BANDAGES/DRESSINGS) ×4
DERMABOND ADVANCED .7 DNX12 (GAUZE/BANDAGES/DRESSINGS) ×1 IMPLANT
DRAIN CHANNEL JP 15F RND 16 (MISCELLANEOUS) ×4 IMPLANT
DRAPE LAPAROTOMY 77X122 PED (DRAPES) IMPLANT
DRAPE LAPAROTOMY TRNSV 106X77 (MISCELLANEOUS) ×3 IMPLANT
DRSG GAUZE FLUFF 36X18 (GAUZE/BANDAGES/DRESSINGS) ×2 IMPLANT
ELECT REM PT RETURN 9FT ADLT (ELECTROSURGICAL) ×3
ELECTRODE REM PT RTRN 9FT ADLT (ELECTROSURGICAL) ×1 IMPLANT
GAUZE SPONGE 4X4 12PLY STRL (GAUZE/BANDAGES/DRESSINGS) ×1 IMPLANT
GLOVE BIO SURGEON STRL SZ 6.5 (GLOVE) ×2 IMPLANT
GLOVE BIO SURGEONS STRL SZ 6.5 (GLOVE) ×1
GOWN STRL REUS W/ TWL LRG LVL3 (GOWN DISPOSABLE) ×1 IMPLANT
GOWN STRL REUS W/TWL LRG LVL3 (GOWN DISPOSABLE) ×9
KIT TURNOVER KIT A (KITS) ×3 IMPLANT
LABEL OR SOLS (LABEL) ×3 IMPLANT
MARGIN MAP 10MM (MISCELLANEOUS) ×3 IMPLANT
NEEDLE HYPO 22GX1.5 SAFETY (NEEDLE) ×2 IMPLANT
PACK BASIN MAJOR ARMC (MISCELLANEOUS) ×3 IMPLANT
RETRACTOR WOUND ALXS 18CM SML (MISCELLANEOUS) IMPLANT
RTRCTR WOUND ALEXIS O 18CM SML (MISCELLANEOUS)
SLEVE PROBE SENORX GAMMA FIND (MISCELLANEOUS) ×3 IMPLANT
SPONGE DRAIN TRACH 4X4 STRL 2S (GAUZE/BANDAGES/DRESSINGS) ×2 IMPLANT
SPONGE LAP 18X18 RF (DISPOSABLE) ×2 IMPLANT
SUT ETHILON 3-0 FS-10 30 BLK (SUTURE) ×3
SUT ETHILON 4-0 (SUTURE)
SUT ETHILON 4-0 FS2 18XMFL BLK (SUTURE)
SUT MNCRL 4-0 (SUTURE) ×3
SUT MNCRL 4-0 27XMFL (SUTURE) ×1
SUT PROLENE 0 CT 1 30 (SUTURE) ×2 IMPLANT
SUT SILK 2 0 SH (SUTURE) IMPLANT
SUT SILK 3-0 (SUTURE) ×3 IMPLANT
SUT VIC AB 3-0 SH 8-18 (SUTURE) ×3 IMPLANT
SUT VICRYL+ 3-0 144IN (SUTURE) ×3 IMPLANT
SUTURE EHLN 3-0 FS-10 30 BLK (SUTURE) ×1 IMPLANT
SUTURE ETHLN 4-0 FS2 18XMF BLK (SUTURE) IMPLANT
SUTURE MNCRL 4-0 27XMF (SUTURE) ×2 IMPLANT
SYR 10ML LL (SYRINGE) ×2 IMPLANT

## 2017-08-18 NOTE — Anesthesia Preprocedure Evaluation (Addendum)
Anesthesia Evaluation  Patient identified by MRN, date of birth, ID band Patient awake    Reviewed: Allergy & Precautions, NPO status , Patient's Chart, lab work & pertinent test results, reviewed documented beta blocker date and time   Airway Mallampati: III  TM Distance: >3 FB     Dental  (+) Chipped, Upper Dentures, Partial Lower   Pulmonary shortness of breath, pneumonia, resolved, COPD, former smoker,           Cardiovascular hypertension, Pt. on medications and Pt. on home beta blockers      Neuro/Psych  Headaches,  Neuromuscular disease    GI/Hepatic GERD  Controlled,  Endo/Other  diabetes, Type 2Hypothyroidism   Renal/GU      Musculoskeletal  (+) Arthritis , Fibromyalgia -  Abdominal   Peds  Hematology   Anesthesia Other Findings EKG ok.  Reproductive/Obstetrics                            Anesthesia Physical Anesthesia Plan  ASA: III  Anesthesia Plan: General   Post-op Pain Management:    Induction: Intravenous  PONV Risk Score and Plan:   Airway Management Planned: LMA  Additional Equipment:   Intra-op Plan:   Post-operative Plan:   Informed Consent: I have reviewed the patients History and Physical, chart, labs and discussed the procedure including the risks, benefits and alternatives for the proposed anesthesia with the patient or authorized representative who has indicated his/her understanding and acceptance.     Plan Discussed with: CRNA  Anesthesia Plan Comments:         Anesthesia Quick Evaluation

## 2017-08-18 NOTE — Anesthesia Postprocedure Evaluation (Signed)
Anesthesia Post Note  Patient: Kristin Estrada  Procedure(s) Performed: MASTECTOMY WITH SENTINEL LYMPH NODE BIOPSY (Left Breast)  Patient location during evaluation: PACU Anesthesia Type: General Level of consciousness: awake and alert Pain management: pain level controlled Vital Signs Assessment: post-procedure vital signs reviewed and stable Respiratory status: spontaneous breathing, nonlabored ventilation, respiratory function stable and patient connected to nasal cannula oxygen Cardiovascular status: blood pressure returned to baseline and stable Postop Assessment: no apparent nausea or vomiting Anesthetic complications: no     Last Vitals:  Vitals:   08/18/17 1626 08/18/17 1723  BP: (!) 131/49 (!) 130/47  Pulse: 81 81  Resp: 13 13  Temp: 37 C 36.9 C  SpO2: 93% 93%    Last Pain:  Vitals:   08/18/17 1723  TempSrc: Oral  PainSc:                  Meade Hogeland S

## 2017-08-18 NOTE — Transfer of Care (Signed)
Immediate Anesthesia Transfer of Care Note  Patient: Kristin Estrada  Procedure(s) Performed: MASTECTOMY WITH SENTINEL LYMPH NODE BIOPSY (Left Breast)  Patient Location: PACU  Anesthesia Type:General  Level of Consciousness: drowsy and patient cooperative  Airway & Oxygen Therapy: Patient Spontanous Breathing and Patient connected to face mask oxygen  Post-op Assessment: Report given to RN, Post -op Vital signs reviewed and stable and Patient moving all extremities  Post vital signs: Reviewed and stable  Last Vitals:  Vitals Value Taken Time  BP 158/61 08/18/2017  2:00 PM  Temp    Pulse 80 08/18/2017  2:00 PM  Resp 17 08/18/2017  2:00 PM  SpO2 96 % 08/18/2017  2:00 PM  Vitals shown include unvalidated device data.  Last Pain:  Vitals:   08/18/17 0934  TempSrc: Tympanic  PainSc: 0-No pain         Complications: No apparent anesthesia complications

## 2017-08-18 NOTE — Anesthesia Post-op Follow-up Note (Signed)
Anesthesia QCDR form completed.        

## 2017-08-18 NOTE — Interval H&P Note (Signed)
History and Physical Interval Note:  08/18/2017 11:26 AM  Kristin Estrada  has presented today for surgery, with the diagnosis of MALIGNANT NEOPLASM LEFT BREAST  The various methods of treatment have been discussed with the patient and family. After consideration of risks, benefits and other options for treatment, the patient has consented to  Procedure(s): MASTECTOMY WITH SENTINEL LYMPH NODE BIOPSY (Left) as a surgical intervention .  The patient's history has been reviewed, patient examined, no change in status, stable for surgery.  I have reviewed the patient's chart and labs. Left breast marked in the pre procedure room.  Questions were answered to the patient's satisfaction.     [name]

## 2017-08-18 NOTE — Op Note (Signed)
Preoperative diagnosis: Invasive mammary carcinoma of left breast.   Postoperative diagnosis:  Invasive mammary carcinoma of left breast. .   Procedure: Left total mastectomy.                      Left axillary sentinel lymph node biopsy  Anesthesia: GETA  Surgeon: Dr. Windell Moment  Assistant Surgeon: Dr. Lysle Pearl  Wound Classification: Clean  Indications: Patient is a 77 y.o. female who had an abnormal mammogram that on workup with core needle biopsy was found to be invasive mammary carcinoma. due to size and skin ulcer it was decided to proceed with simple mastectomy and sentinel lymph node biopsy.   Findings: 1. Inferior breast ulceration from the tumor. 2. One sentinel lymph node biopsy negative for gross metastasis 3. No other palpable lymph node or mass.  4. Adequate hemostasis.   Description of procedure: The patient was brought to the operating room and general anesthesia was induced. A time-out was completed verifying correct patient, procedure, site, positioning, and implant(s) and/or special equipment prior to beginning this procedure. The left breast, chest wall, axilla, and upper arm and neck were prepped and draped in the usual sterile fashion. Since there was no Counselling psychologist, Surgeon Dr. Lysle Pearl assisted in the procedure due to difficulty of the procedure since the patient has an ulcer that had to be included int the excision and closing was going to be more difficult.   A skin incision was made that encompassed the nipple-areola complex and the inferior ulcer and passed in an oblique direction across the breast. Flaps were raised in the avascular plane between subcutaneous tissue and breast tissue from the clavicle superiorly, the sternum medially, the anterior rectus sheath inferiorly, and past the lateral border of the pectoralis major muscle laterally. Hemostasis was achieved in the flaps. Next, the breast tissue and underlying pectoralis fascia were excised from the  pectoralis major muscle, progressing from medially to laterally. At the lateral border of the pectoralis major muscle, the breast tissue was swung laterally and a lateral pedicle identified where breast tissue gave way to fat of axilla. The lateral pedicle was incised and the specimen removed. A hand-held gamma probe was used to identify the location of the hottest spot in the axilla. From the same incision of the mastectomy the axillary fascia was opened. Dissection was carried down axillary fat was found. The probe was placed and again, the point of maximal count was found. Dissection continue until nodule was identified. The probe was placed in contact with the node and 1914 counts were recorded. The node was excised in its entirety. Ex vivo, the node measured 1972 counts when placed on the probe. The bed of the node measured 07 counts. No additional hot spots were identified. No clinically abnormal nodes were palpated. Pathology called referring that the lymph node was grossly negative for macroscopic metastasis. Pathology also refers that the inferior margin was close to the skin. Additional skin margin was re excised.   The wound was irrigated and hemostasis was achieved. Closed suction drains were brought into the operative field through a separate stab incision and sutured to the skin with a 3-0 nylon suture. The wound was closed with interrupted 3-0 Vicryl to the subcutaneous layer, followed by a subcuticular layer of Monocryl 4-0. The wound was dressed.  The patient tolerated the procedure well and was taken to the postanesthesia care unit in stable condition.   Specimen:  Left Breast  Inferior skin margin re excision                      Left axillary sentinel lymph node  Complications: None  Estimated Blood Loss: 50 mL

## 2017-08-18 NOTE — Anesthesia Procedure Notes (Signed)
Procedure Name: LMA Insertion Date/Time: 08/18/2017 11:14 AM Performed by: Nile Riggs, CRNA Pre-anesthesia Checklist: Patient identified, Emergency Drugs available, Suction available, Patient being monitored and Timeout performed Patient Re-evaluated:Patient Re-evaluated prior to induction Oxygen Delivery Method: Circle system utilized Preoxygenation: Pre-oxygenation with 100% oxygen Induction Type: IV induction Ventilation: Mask ventilation without difficulty LMA: LMA inserted LMA Size: 3.5 Tube type: Oral Number of attempts: 1 Placement Confirmation: positive ETCO2,  CO2 detector and breath sounds checked- equal and bilateral Tube secured with: Tape Dental Injury: Teeth and Oropharynx as per pre-operative assessment  Comments: Atraumatic seating of AirQ LMA. Soft bite block placed after LMA seated to ensure proper seal.

## 2017-08-18 NOTE — Progress Notes (Signed)
PHARMACIST - PHYSICIAN ORDER COMMUNICATION  CONCERNING: P&T Medication Policy on Herbal Medications  DESCRIPTION:  This patient's order for:  Co Q 10 Caps 10mg    has been noted. This product(s) is classified as an "herbal" or natural product. Due to a lack of definitive safety studies or FDA approval, nonstandard manufacturing practices, plus the potential risk of unknown drug-drug interactions while on inpatient medications, the Pharmacy and Therapeutics Committee does not permit the use of "herbal" or natural products of this type within [facility].   ACTION TAKEN: The pharmacy department is unable to verify this order. Please reevaluate patient's clinical condition at discharge and address if the herbal or natural product(s) should be resumed at that time.  Pernell Dupre, PharmD, BCPS Clinical Pharmacist 08/18/2017 4:21 PM

## 2017-08-19 DIAGNOSIS — C50512 Malignant neoplasm of lower-outer quadrant of left female breast: Secondary | ICD-10-CM | POA: Diagnosis not present

## 2017-08-19 MED ORDER — HYDROCODONE-ACETAMINOPHEN 5-325 MG PO TABS: 1.0000 | ORAL_TABLET | ORAL | 0 refills | Status: DC | PRN

## 2017-08-19 NOTE — Care Management Obs Status (Signed)
[facility] NOTIFICATION   Patient Details  Name: Kristin Estrada MRN: [id number] Date of Birth: [birth year]   Medicare Observation Status Notification Given:  No(admitted less than 24 hours)    Beverly Sessions, RN 08/19/2017, 9:50 AM

## 2017-08-19 NOTE — Progress Notes (Signed)
Drain care education taught to pt.

## 2017-08-19 NOTE — Discharge Instructions (Signed)
°  Diet: Resume home heart healthy regular diet.   Activity: No heavy lifting >20 pounds (children, pets, laundry, garbage) or strenuous activity until follow-up, but light activity and walking are encouraged. Do not drive or drink alcohol if taking narcotic pain medications.  Wound care: May take a cloth bath with soapy water and pat dry (do not rub incisions), but no baths or submerging incision underwater until follow-up. (no swimming)   Chart the drain output every day.   Call us if has any question regarding the drain or the wound care. A home nurse will be with you starting Friday 5th of July.   Medications: Resume all home medications. For mild to moderate pain: acetaminophen (Tylenol) or ibuprofen (if no kidney disease). Combining Tylenol with alcohol can substantially increase your risk of causing liver disease. Narcotic pain medications, if prescribed, can be used for severe pain, though may cause nausea, constipation, and drowsiness. Do not combine Tylenol and Norco within a 6 hour period as Norco contains Tylenol. If you do not need the narcotic pain medication, you do not need to fill the prescription.  Call office [phone]) at any time if any questions, worsening pain, fevers/chills, bleeding, drainage from incision site, or other concerns.

## 2017-08-19 NOTE — Discharge Summary (Signed)
Physician Discharge Summary  Patient ID: Kristin Estrada MRN: [id number] DOB/AGE: 1940-08-16 77 y.o.  Admit date: 08/18/2017 Discharge date: 08/19/2017  Admission Diagnoses:  Breast cancer  Discharge Diagnoses:  Active Problems:   Breast cancer [facility])   Discharged Condition: good  Hospital Course: Patient underwent left simple mastectomy with left axillary sentinel lymph node biopsy. The patient tolerated the procedure well. Today the pain in well controlled with oral pain medications referring just some soreness. The drain output was 70 mL since surgery and is serosanguinolent. The wound has associated bruises but with adequate viable flaps. No ischemic tissue. Patient oriented on how to take care of drain.   Consults: None  Significant Diagnostic Studies: None  Treatments: analgesia: acetaminophen, Morphine and Norco and surgery: Left simple mastectomy with left axillary sentinel lymph node biopsy.   Discharge Exam: Blood pressure (!) 136/59, pulse 76, temperature 97.9 F (36.6 C), temperature source Oral, resp. rate 20, height 5\' 2"  (1.575 m), weight 85.2 kg (187 lb 12.8 oz), SpO2 94 %. General appearance: alert and cooperative Resp: clear to auscultation bilaterally Chest wall: no tenderness, left sided chest wall tenderness with bruise associated to the surgical area.  Breasts: normal appearance, no masses or tenderness on right breast Cardio: regular rate and rhythm, S1, S2 normal, no murmur, click, rub or gallop Incision/Wound: Dry and clean with associated bruises. No ischemic tissue.   Disposition: Discharge disposition: 01-Home or Self Care       Discharge Instructions    Diet - low sodium heart healthy   Complete by:  As directed    Increase activity slowly   Complete by:  As directed      Allergies as of 08/19/2017      Reactions   Protonix [pantoprazole Sodium] Other (See Comments)   abd cramps   Prednisone Other (See Comments)   Agitation, insomina,  mood change. Tolerates injection- not PO   Statins Other (See Comments)   Legs hurt      Medication List    TAKE these medications   acetaminophen 500 MG tablet Commonly known as:  TYLENOL Take 500 mg by mouth every 6 (six) hours as needed for moderate pain or headache.   albuterol (2.5 MG/3ML) 0.083% nebulizer solution Commonly known as:  PROVENTIL Take 2.5 mg by nebulization 2 (two) times daily as needed for wheezing or shortness of breath. What changed:  Another medication with the same name was changed. Make sure you understand how and when to take each.   albuterol 108 (90 Base) MCG/ACT inhaler Commonly known as:  PROAIR HFA Inhale 2 puffs into the lungs every 4 (four) hours as needed. What changed:  reasons to take this   amLODipine 5 MG tablet Commonly known as:  NORVASC Take 5 mg by mouth daily.   AVASTIN IV Place 1 Dose into both eyes every 30 (thirty) days.   cetirizine 10 MG tablet Commonly known as:  ZYRTEC Take 10 mg by mouth at bedtime.   cholecalciferol 1000 units tablet Commonly known as:  VITAMIN D Take 1,000 Units by mouth daily.   clotrimazole 1 % cream Commonly known as:  LOTRIMIN Apply 1 application topically 2 (two) times daily as needed (for yeast infection).   Co Q 10 10 MG Caps Take 10 mg by mouth daily.   [employer] LEG CRAMPS PAIN RELIEF PO Take 2 tablets by mouth every 4 (four) hours as needed (for leg cramps).   dextromethorphan-guaiFENesin 30-600 MG 12hr tablet Commonly known as:  MUCINEX  DM Take 1 tablet by mouth every 12 (twelve) hours as needed for cough.   diazepam 5 MG tablet Commonly known as:  VALIUM Take 0.5-1 tablets (2.5-5 mg total) by mouth every 6 (six) hours as needed for muscle spasms or sedation. What changed:    how much to take  when to take this   diclofenac 75 MG EC tablet Commonly known as:  VOLTAREN Take 75 mg by mouth 2 (two) times daily.   DULoxetine 60 MG capsule Commonly known as:  CYMBALTA Take 60 mg by  mouth daily as needed (for pain).   esomeprazole 40 MG capsule Commonly known as:  NEXIUM Take 40 mg by mouth daily as needed (for acid reflux).   famotidine 20 MG tablet Commonly known as:  PEPCID Take 1 tablet (20 mg total) by mouth 2 (two) times daily. When coughing   HYDROcodone-acetaminophen 5-325 MG tablet Commonly known as:  NORCO/VICODIN Take 1 tablet by mouth every 4 (four) hours as needed for moderate pain.   ICAPS PO Take 1 capsule by mouth daily.   ipratropium 0.02 % nebulizer solution Commonly known as:  ATROVENT Take 0.5 mg by nebulization 2 (two) times daily as needed for wheezing or shortness of breath.   levothyroxine 25 MCG tablet Commonly known as:  SYNTHROID, LEVOTHROID Take 25 mcg by mouth daily before breakfast.   losartan 100 MG tablet Commonly known as:  COZAAR Take 100 mg by mouth daily.   Magnesium 400 MG Caps Take 400 mg by mouth See admin instructions. Take 400 mg by mouth in the morning every day and take 400 mg by mouth at bedtime 3 times weekly   metoprolol succinate 25 MG 24 hr tablet Commonly known as:  TOPROL-XL Take 25 mg by mouth daily.   mometasone 50 MCG/ACT nasal spray Commonly known as:  NASONEX Place 1 spray into the nose daily.   nystatin 100000 UNIT/ML suspension Commonly known as:  MYCOSTATIN Take 5 mLs (500,000 Units total) by mouth as needed. What changed:    when to take this  reasons to take this   SYMBICORT 160-4.5 MCG/ACT inhaler Generic drug:  budesonide-formoterol Inhale 2 puffs into the lungs 2 (two) times daily.   SYSTANE OP Place 1 drop into both eyes 3 (three) times daily as needed (for dry eyes).   vitamin C 500 MG tablet Commonly known as:  ASCORBIC ACID Take 500 mg by mouth daily.        Signed: [name] 08/19/2017, 9:43 AM

## 2017-08-20 ENCOUNTER — Encounter: Payer: Self-pay | Admitting: General Surgery

## 2017-08-20 DIAGNOSIS — Z483 Aftercare following surgery for neoplasm: Secondary | ICD-10-CM | POA: Diagnosis not present

## 2017-08-20 DIAGNOSIS — Z87891 Personal history of nicotine dependence: Secondary | ICD-10-CM | POA: Diagnosis not present

## 2017-08-20 DIAGNOSIS — I10 Essential (primary) hypertension: Secondary | ICD-10-CM | POA: Diagnosis not present

## 2017-08-20 DIAGNOSIS — Z438 Encounter for attention to other artificial openings: Secondary | ICD-10-CM | POA: Diagnosis not present

## 2017-08-20 DIAGNOSIS — Z9012 Acquired absence of left breast and nipple: Secondary | ICD-10-CM | POA: Diagnosis not present

## 2017-08-20 DIAGNOSIS — J449 Chronic obstructive pulmonary disease, unspecified: Secondary | ICD-10-CM | POA: Diagnosis not present

## 2017-08-20 DIAGNOSIS — M797 Fibromyalgia: Secondary | ICD-10-CM | POA: Diagnosis not present

## 2017-08-20 DIAGNOSIS — Z96612 Presence of left artificial shoulder joint: Secondary | ICD-10-CM | POA: Diagnosis not present

## 2017-08-20 DIAGNOSIS — C50512 Malignant neoplasm of lower-outer quadrant of left female breast: Secondary | ICD-10-CM | POA: Diagnosis not present

## 2017-08-20 LAB — SURGICAL PATHOLOGY

## 2017-08-24 NOTE — Progress Notes (Signed)
  Oncology Nurse Navigator Documentation  Navigator Location: [facility] (08/24/17 1500)   )Navigator Encounter Type: Telephone (08/24/17 1500)       Surgery Date: 08/18/17 (08/24/17 1500)             Patient Visit Type: Follow-up;Surgery (08/24/17 1500)   Barriers/Navigation Needs: Coordination of Care (08/24/17 1500)       Coordination of Care: Appts (08/24/17 1500)                  Time Spent with Patient: 30 (08/24/17 1500)   PAtient doing well post-op.  Worked with patient to coordinate post-op appointments on 09/07/17.

## 2017-08-26 ENCOUNTER — Other Ambulatory Visit: Payer: Self-pay

## 2017-08-27 DIAGNOSIS — C50912 Malignant neoplasm of unspecified site of left female breast: Secondary | ICD-10-CM | POA: Diagnosis not present

## 2017-08-30 DIAGNOSIS — H353212 Exudative age-related macular degeneration, right eye, with inactive choroidal neovascularization: Secondary | ICD-10-CM | POA: Diagnosis not present

## 2017-08-31 ENCOUNTER — Telehealth: Payer: Self-pay | Admitting: *Deleted

## 2017-08-31 ENCOUNTER — Other Ambulatory Visit: Payer: Self-pay | Admitting: Oncology

## 2017-08-31 DIAGNOSIS — H353212 Exudative age-related macular degeneration, right eye, with inactive choroidal neovascularization: Secondary | ICD-10-CM | POA: Diagnosis not present

## 2017-08-31 NOTE — Telephone Encounter (Signed)
Call returned.

## 2017-08-31 NOTE — Telephone Encounter (Signed)
Please call Richardson Landry at [facility] for test results. [phone]

## 2017-09-01 NOTE — Telephone Encounter (Signed)
I called and could not get a person on the line.  Does Richardson Landry have a direct extension?

## 2017-09-03 ENCOUNTER — Encounter
Admission: RE | Admit: 2017-09-03 | Discharge: 2017-09-03 | Disposition: A | Discharge status: Home / Self Care | Payer: PPO | Source: Ambulatory Visit | Attending: Plastic Surgery | Admitting: Plastic Surgery

## 2017-09-03 ENCOUNTER — Ambulatory Visit: Payer: Self-pay | Admitting: Plastic Surgery

## 2017-09-03 ENCOUNTER — Inpatient Hospital Stay: Admission: RE | Admit: 2017-09-03 | Payer: PPO | Source: Ambulatory Visit

## 2017-09-03 ENCOUNTER — Ambulatory Visit: Payer: Self-pay | Admitting: General Surgery

## 2017-09-03 ENCOUNTER — Other Ambulatory Visit: Payer: Self-pay

## 2017-09-03 DIAGNOSIS — Z01812 Encounter for preprocedural laboratory examination: Secondary | ICD-10-CM | POA: Diagnosis not present

## 2017-09-03 DIAGNOSIS — C50512 Malignant neoplasm of lower-outer quadrant of left female breast: Secondary | ICD-10-CM | POA: Diagnosis not present

## 2017-09-03 DIAGNOSIS — C50912 Malignant neoplasm of unspecified site of left female breast: Secondary | ICD-10-CM | POA: Diagnosis not present

## 2017-09-03 DIAGNOSIS — Z170 Estrogen receptor positive status [ER+]: Secondary | ICD-10-CM | POA: Diagnosis not present

## 2017-09-03 LAB — CBC
HCT: 38 % (ref 35.0–47.0)
Hemoglobin: 12.8 g/dL (ref 12.0–16.0)
MCH: 29.3 pg (ref 26.0–34.0)
MCHC: 33.7 g/dL (ref 32.0–36.0)
MCV: 86.8 fL (ref 80.0–100.0)
PLATELETS: 426 10*3/uL (ref 150–440)
RBC: 4.37 MIL/uL (ref 3.80–5.20)
RDW: 15.6 % — ABNORMAL HIGH (ref 11.5–14.5)
WBC: 12.3 10*3/uL — AB (ref 3.6–11.0)

## 2017-09-03 NOTE — H&P (View-Only) (Signed)
HISTORY OF PRESENT ILLNESS:    Kristin Estrada is a 77 y.o.female patient who comes evaluation of left mastectomy wound.  KristinBarber-Cookreports that two months ago she felt pain on her left breast while sleeping and felt a skin lesion. She thought that it was an insect bite and tried "to let the body absorb it". Two month later she went to the PCP for cholesterol check up and notified the PCP about the skin lesion. The PCP ordered a mammogram and ultrasound of the breast which was done on 07/09/17 and patient was found with a 3 cm mass. The mass was biopsied on 07/21/17 and found with invasive mammary carcinoma. Patient was evaluated by Dr. Grayland Ormond on 07/27/17 who due to the size of the lesion and the ulceration, recommended to start with neoadjuvant chemotherapy.  After discussion on Breast Tumor Board, it was decided that patient will not benefit of neoadjuvant chemotherapy and it was decided to proceed with surgery upfront.  Patient had left simple mastectomy with sentinel lymph node biopsy. During surgery the patient had re excision of the inferior margin due to proximity the mass in the frozen section. The final pathology showed an Invasive Mammary Carcinoma of 3.5 cm, grade 1 with two lymph node with isolated cells and the sentinel lymph node with micrometastasis. Final pathology found a focus of invasive mammary carcinoma in the new inferior margin.     PAST MEDICAL HISTORY:      Past Medical History:  Diagnosis Date  . Arthritis   . Breast cancer , unspecified (CMS-HCC) 2019  . COPD (chronic obstructive pulmonary disease) , unspecified (CMS-HCC)   . Fibromyalgia   . GERD (gastroesophageal reflux disease)   . Hypertension   . Thyroid disease         PAST SURGICAL HISTORY:   Past Surgical History:  Procedure Laterality Date  . CYSTECTOMY Bilateral 1972   both breast  . TOTAL SHOULDER REPLACEMENT Left 2016         MEDICATIONS:  EncounterMedications         Outpatient Encounter Medications as of 09/02/2017  Medication Sig Dispense Refill  . acetaminophen (TYLENOL) 500 MG tablet Take 500 mg by mouth every 6 (six) hours      . albuterol (PROAIR HFA) 90 mcg/actuation inhaler Inhale 2 inhalations into the lungs every 6 (six) hours as needed      . amino acids-protein hydrolys (PRE PROTEIN 20) 20 gram-80 kcal/30 mL Liqd Take 30 mLs by mouth once daily 480 mL 0  . amLODIPine (NORVASC) 5 MG tablet Take 5 mg by mouth once daily      . ascorbic acid, vitamin C, (VITAMIN C) 500 MG tablet Take 500 mg by mouth once daily      . bevacizumab (AVASTIN) 25 mg/mL intralesional injection Apply to eye    . budesonide-formoterol (SYMBICORT) 160-4.5 mcg/actuation inhaler Inhale 2 puffs into the lungs 2 (two) times daily.    . cetirizine (ZYRTEC) 10 MG tablet Take 10 mg by mouth once daily      . cholecalciferol (CHOLECALCIFEROL) 1,000 unit tablet Take 1,000 Units by mouth once daily      . coenzyme Q10 10 mg capsule Take 10 mg by mouth once daily    . dextromethorphan-guaifenesin (MUCINEX DM) 30-600 mg ER tablet Take 1 tablet by mouth every 12 (twelve) hours    . diazePAM (VALIUM) 5 MG tablet Take 5 mg by mouth as needed      . diclofenac (VOLTAREN) 75 MG EC tablet  Take 75 mg by mouth 2 (two) times daily    . FREESTYLE LITE STRIPS test strip once daily      . ipratropium (ATROVENT) 0.02 % nebulizer solution Inhale 0.02 mcg into the lungs as needed    . levothyroxine (SYNTHROID, LEVOTHROID) 25 MCG tablet Take 25 mcg by mouth every morning before breakfast    . losartan (COZAAR) 100 MG tablet Take 100 mg by mouth once daily    . magnesium oxide 400 mg magnesium Take 400 mg by mouth 2 (two) times daily    . metoprolol succinate (TOPROL-XL) 25 MG XL tablet Take 25 mg by mouth once daily    . mometasone (NASONEX) 50 mcg/actuation nasal spray 1 spray by Nasal route once daily    . nystatin (MYCOSTATIN) 100,000 unit/mL suspension Take 5  mLs by mouth as needed    . predniSONE (DELTASONE) 10 MG tablet      No facility-administered encounter medications on file as of 09/02/2017.        ALLERGIES:   Pantoprazole sodium and Statins-hmg-coa reductase inhibitors   SOCIAL HISTORY:  Social History          Socioeconomic History  . Marital status: Widowed    Spouse name: Not on file  . Number of children: Not on file  . Years of education: Not on file  . Highest education level: Not on file  Occupational History  . Occupation: retired  Scientific laboratory technician  . Financial resource strain: Not on file  . Food insecurity:    Worry: Not on file    Inability: Not on file  . Transportation needs:    Medical: Not on file    Non-medical: Not on file  Tobacco Use  . Smoking status: Former Smoker    Last attempt to quit: 2006    Years since quitting: 13.5  . Smokeless tobacco: Never Used  Substance and Sexual Activity  . Alcohol use: Never    Frequency: Never  . Drug use: Never  . Sexual activity: Not on file  Other Topics Concern  . Not on file  Social History Narrative  . Not on file      FAMILY HISTORY:       Family History  Problem Relation Age of Onset  . Coronary Artery Disease (Blocked arteries around heart) Mother   . Parkinsonism Father   . Coronary Artery Disease (Blocked arteries around heart) Sister   . Prostate cancer Paternal Uncle      PHYSICAL EXAM:     Vitals:   09/02/17 1546  BP: 159/64  Pulse: 70  Temp: 37.2 C (98.9 F)  .  Ht:165.1 cm (5\' 5" ) Wt:78.9 kg (174 lb) IHK:VQQV surface area is 1.9 meters squared. Body mass index is 28.96 kg/m.Marland Kitchen   GENERAL: Alert, active, oriented x3  HEENT: Pupils equal reactive to light. Extraocular movements are intact. Sclera clear. Palpebral conjunctiva normal red color.  NECK: Supple with no palpable mass and no adenopathy.  LUNGS: Sound clear with no rales rhonchi or wheezes.  HEART: Regular rhythm S1 and S2  without murmur.  BREAST: left chest with healing wound with mid portion necrotic tissue, not dehisced. Not infection. No palpable mass. Right breast without suspicious skin lesion, no palpable mass or lymph node.   ABDOMEN: Soft and depressible, nontender with no palpable mass, no hepatomegaly.  EXTREMITIES: Well-developed well-nourished symmetrical with no dependent edema.  NEUROLOGICAL: Awake alert oriented, facial expression symmetrical, moving all extremities.      IMPRESSION:  INVASIVE MAMMARY CARCINOMA, NO SPECIAL TYPE, 3.5 CM, GRADE 1, WITH SKIN ULCERATION - Positive skin margin - Patient evaluated by Plastic and Reconstructive Surgery service for assistance in re excision of inferior margin of the wound and closure. - Long discussion with patient about the surgery goals, benefits and risks - Local care given to patient on wound.            PLAN:  1. Re excision of margin on 09/10/17 2. Avoid aspirin 5 days before surgery 3. Continue local care to wound 4. Contact us if has any question or concern.   Patient verbalized understanding, all questions were answered, and were agreeable with the plan outlined above.   [name], MD  Electronically signed by [name], MD

## 2017-09-03 NOTE — H&P (Signed)
HISTORY OF PRESENT ILLNESS:    Kristin Estrada is a 77 y.o.female patient who comes evaluation of left mastectomy wound.  KristinBarber-Cookreports that two months ago she felt pain on her left breast while sleeping and felt a skin lesion. She thought that it was an insect bite and tried "to let the body absorb it". Two month later she went to the PCP for cholesterol check up and notified the PCP about the skin lesion. The PCP ordered a mammogram and ultrasound of the breast which was done on 07/09/17 and patient was found with a 3 cm mass. The mass was biopsied on 07/21/17 and found with invasive mammary carcinoma. Patient was evaluated by Dr. Grayland Ormond on 07/27/17 who due to the size of the lesion and the ulceration, recommended to start with neoadjuvant chemotherapy.  After discussion on Breast Tumor Board, it was decided that patient will not benefit of neoadjuvant chemotherapy and it was decided to proceed with surgery upfront.  Patient had left simple mastectomy with sentinel lymph node biopsy. During surgery the patient had re excision of the inferior margin due to proximity the mass in the frozen section. The final pathology showed an Invasive Mammary Carcinoma of 3.5 cm, grade 1 with two lymph node with isolated cells and the sentinel lymph node with micrometastasis. Final pathology found a focus of invasive mammary carcinoma in the new inferior margin.     PAST MEDICAL HISTORY:      Past Medical History:  Diagnosis Date  . Arthritis   . Breast cancer , unspecified (CMS-HCC) 2019  . COPD (chronic obstructive pulmonary disease) , unspecified (CMS-HCC)   . Fibromyalgia   . GERD (gastroesophageal reflux disease)   . Hypertension   . Thyroid disease         PAST SURGICAL HISTORY:   Past Surgical History:  Procedure Laterality Date  . CYSTECTOMY Bilateral 1972   both breast  . TOTAL SHOULDER REPLACEMENT Left 2016         MEDICATIONS:  EncounterMedications         Outpatient Encounter Medications as of 09/02/2017  Medication Sig Dispense Refill  . acetaminophen (TYLENOL) 500 MG tablet Take 500 mg by mouth every 6 (six) hours      . albuterol (PROAIR HFA) 90 mcg/actuation inhaler Inhale 2 inhalations into the lungs every 6 (six) hours as needed      . amino acids-protein hydrolys (PRE PROTEIN 20) 20 gram-80 kcal/30 mL Liqd Take 30 mLs by mouth once daily 480 mL 0  . amLODIPine (NORVASC) 5 MG tablet Take 5 mg by mouth once daily      . ascorbic acid, vitamin C, (VITAMIN C) 500 MG tablet Take 500 mg by mouth once daily      . bevacizumab (AVASTIN) 25 mg/mL intralesional injection Apply to eye    . budesonide-formoterol (SYMBICORT) 160-4.5 mcg/actuation inhaler Inhale 2 puffs into the lungs 2 (two) times daily.    . cetirizine (ZYRTEC) 10 MG tablet Take 10 mg by mouth once daily      . cholecalciferol (CHOLECALCIFEROL) 1,000 unit tablet Take 1,000 Units by mouth once daily      . coenzyme Q10 10 mg capsule Take 10 mg by mouth once daily    . dextromethorphan-guaifenesin (MUCINEX DM) 30-600 mg ER tablet Take 1 tablet by mouth every 12 (twelve) hours    . diazePAM (VALIUM) 5 MG tablet Take 5 mg by mouth as needed      . diclofenac (VOLTAREN) 75 MG EC tablet  Take 75 mg by mouth 2 (two) times daily    . FREESTYLE LITE STRIPS test strip once daily      . ipratropium (ATROVENT) 0.02 % nebulizer solution Inhale 0.02 mcg into the lungs as needed    . levothyroxine (SYNTHROID, LEVOTHROID) 25 MCG tablet Take 25 mcg by mouth every morning before breakfast    . losartan (COZAAR) 100 MG tablet Take 100 mg by mouth once daily    . magnesium oxide 400 mg magnesium Take 400 mg by mouth 2 (two) times daily    . metoprolol succinate (TOPROL-XL) 25 MG XL tablet Take 25 mg by mouth once daily    . mometasone (NASONEX) 50 mcg/actuation nasal spray 1 spray by Nasal route once daily    . nystatin (MYCOSTATIN) 100,000 unit/mL suspension Take 5  mLs by mouth as needed    . predniSONE (DELTASONE) 10 MG tablet      No facility-administered encounter medications on file as of 09/02/2017.        ALLERGIES:   Pantoprazole sodium and Statins-hmg-coa reductase inhibitors   SOCIAL HISTORY:  Social History          Socioeconomic History  . Marital status: Widowed    Spouse name: Not on file  . Number of children: Not on file  . Years of education: Not on file  . Highest education level: Not on file  Occupational History  . Occupation: retired  Scientific laboratory technician  . Financial resource strain: Not on file  . Food insecurity:    Worry: Not on file    Inability: Not on file  . Transportation needs:    Medical: Not on file    Non-medical: Not on file  Tobacco Use  . Smoking status: Former Smoker    Last attempt to quit: 2006    Years since quitting: 13.5  . Smokeless tobacco: Never Used  Substance and Sexual Activity  . Alcohol use: Never    Frequency: Never  . Drug use: Never  . Sexual activity: Not on file  Other Topics Concern  . Not on file  Social History Narrative  . Not on file      FAMILY HISTORY:       Family History  Problem Relation Age of Onset  . Coronary Artery Disease (Blocked arteries around heart) Mother   . Parkinsonism Father   . Coronary Artery Disease (Blocked arteries around heart) Sister   . Prostate cancer Paternal Uncle      PHYSICAL EXAM:     Vitals:   09/02/17 1546  BP: 159/64  Pulse: 70  Temp: 37.2 C (98.9 F)  .  Ht:165.1 cm (5\' 5" ) Wt:78.9 kg (174 lb) IRS:WNIO surface area is 1.9 meters squared. Body mass index is 28.96 kg/m.Marland Kitchen   GENERAL: Alert, active, oriented x3  HEENT: Pupils equal reactive to light. Extraocular movements are intact. Sclera clear. Palpebral conjunctiva normal red color.  NECK: Supple with no palpable mass and no adenopathy.  LUNGS: Sound clear with no rales rhonchi or wheezes.  HEART: Regular rhythm S1 and S2  without murmur.  BREAST: left chest with healing wound with mid portion necrotic tissue, not dehisced. Not infection. No palpable mass. Right breast without suspicious skin lesion, no palpable mass or lymph node.   ABDOMEN: Soft and depressible, nontender with no palpable mass, no hepatomegaly.  EXTREMITIES: Well-developed well-nourished symmetrical with no dependent edema.  NEUROLOGICAL: Awake alert oriented, facial expression symmetrical, moving all extremities.      IMPRESSION:  INVASIVE MAMMARY CARCINOMA, NO SPECIAL TYPE, 3.5 CM, GRADE 1, WITH SKIN ULCERATION - Positive skin margin - Patient evaluated by Plastic and Reconstructive Surgery service for assistance in re excision of inferior margin of the wound and closure. - Long discussion with patient about the surgery goals, benefits and risks - Local care given to patient on wound.            PLAN:  1. Re excision of margin on 09/10/17 2. Avoid aspirin 5 days before surgery 3. Continue local care to wound 4. Contact us if has any question or concern.   Patient verbalized understanding, all questions were answered, and were agreeable with the plan outlined above.   [name], MD  Electronically signed by [name], MD

## 2017-09-03 NOTE — Patient Instructions (Signed)
Your procedure is scheduled on: Friday 7/26 Report to Day Surgery. To find out your arrival time please call [phone] between 1PM - 3PM on Thurs 7/25.  Remember: Instructions that are not followed completely may result in serious medical risk,  up to and including death, or upon the discretion of your surgeon and anesthesiologist your  surgery may need to be rescheduled.     _X__ 1. Do not eat food after midnight the night before your procedure.                 No gum chewing or hard candies. You may drink clear liquids up to 2 hours                 before you are scheduled to arrive for your surgery- DO not drink clear                 liquids within 2 hours of the start of your surgery.                 Clear Liquids include:  water, apple juice without pulp, clear carbohydrate                 drink such as Clearfast of Gatorade, Black Coffee or Tea (Do not add                 anything to coffee or tea).  __X__2.  On the morning of surgery brush your teeth with toothpaste and water, you                may rinse your mouth with mouthwash if you wish.  Do not swallow any toothpaste of mouthwash.     ___ 3.  No Alcohol for 24 hours before or after surgery.   ___ 4.  Do Not Smoke or use e-cigarettes For 24 Hours Prior to Your Surgery.                 Do not use any chewable tobacco products for at least 6 hours prior to                 surgery.  ____  5.  Bring all medications with you on the day of surgery if instructed.   ___x_  6.  Notify your doctor if there is any change in your medical condition      (cold, fever, infections).     Do not wear jewelry, make-up, hairpins, clips or nail polish. Do not wear lotions, powders, or perfumes. You may wear deodorant. Do not shave 48 hours prior to surgery. Men may shave face and neck. Do not bring valuables to the hospital.    [facility] is not responsible for any belongings or valuables.  Contacts, dentures  or bridgework may not be worn into surgery. Leave your suitcase in the car. After surgery it may be brought to your room. For patients admitted to the hospital, discharge time is determined by your treatment team.   Patients discharged the day of surgery will not be allowed to drive home.   Please read over the following fact sheets that you were given:  x  ____ Take these medicines the morning of surgery with A SIP OF WATER:    1. esomeprazole (NEXIUM) 40 MG capsule  2. levothyroxine (SYNTHROID, LEVOTHROID) 25 MCG tablet  3. amLODipine (NORVASC) 5 MG tablet  4.acetaminophen (TYLENOL) 500 MG tablet if needed  5.cetirizine (ZYRTEC) 10 MG tablet  6.mometasone (NASONEX) 50 MCG/ACT nasal spray             7.metoprolol succinate (TOPROL-XL) 25 MG 24 hr tablet  ____ Fleet Enema (as directed)   __x__ Use CHG Soap as directed  __x__ Use inhalers on the day of surgery albuterol (PROAIR HFA) 108 (90 BASE) MCG/ACT inhaler,SYMBICORT 160-4.5 MCG/ACT inhaler  ____ Stop metformin 2 days prior to surgery    ____ Take 1/2 of usual insulin dose the night before surgery. No insulin the morning          of surgery.   ____ Stop Coumadin/Plavix/aspirin on   __x__ Stop Anti-inflammatories diclofenac (VOLTAREN) 75 MG EC tablet today    __x__ Stop supplements until after surgery.  Coenzyme Q10 (CO Q 10) 10 MG CAPS,Homeopathic Products ([employer] LEG CRAMPS PAIN RELIEF PO), vitamin C (ASCORBIC ACID) 500 MG tablet ____ Bring C-Pap to the hospital.

## 2017-09-06 ENCOUNTER — Encounter: Payer: Self-pay | Admitting: Oncology

## 2017-09-07 ENCOUNTER — Inpatient Hospital Stay: Payer: PPO | Admitting: Oncology

## 2017-09-08 DIAGNOSIS — H353221 Exudative age-related macular degeneration, left eye, with active choroidal neovascularization: Secondary | ICD-10-CM | POA: Diagnosis not present

## 2017-09-09 MED ORDER — CEFAZOLIN SODIUM-DEXTROSE 2-4 GM/100ML-% IV SOLN
2.0000 g | INTRAVENOUS | Status: AC
  Administered 2017-09-10: 2 g via INTRAVENOUS

## 2017-09-10 ENCOUNTER — Ambulatory Visit: Payer: PPO | Admitting: Registered Nurse

## 2017-09-10 ENCOUNTER — Other Ambulatory Visit: Payer: Self-pay

## 2017-09-10 ENCOUNTER — Observation Stay
Admission: RE | Admit: 2017-09-10 | Discharge: 2017-09-11 | Disposition: A | Discharge status: Home / Self Care | Payer: PPO | Source: Ambulatory Visit | Attending: General Surgery | Admitting: General Surgery

## 2017-09-10 ENCOUNTER — Encounter: Payer: Self-pay | Admitting: Anesthesiology

## 2017-09-10 ENCOUNTER — Encounter
Admission: RE | Disposition: A | Discharge status: Home / Self Care | Payer: Self-pay | Source: Ambulatory Visit | Attending: General Surgery

## 2017-09-10 DIAGNOSIS — C50919 Malignant neoplasm of unspecified site of unspecified female breast: Secondary | ICD-10-CM | POA: Diagnosis present

## 2017-09-10 DIAGNOSIS — I10 Essential (primary) hypertension: Secondary | ICD-10-CM | POA: Diagnosis not present

## 2017-09-10 DIAGNOSIS — M1990 Unspecified osteoarthritis, unspecified site: Secondary | ICD-10-CM | POA: Diagnosis not present

## 2017-09-10 DIAGNOSIS — E039 Hypothyroidism, unspecified: Secondary | ICD-10-CM | POA: Diagnosis not present

## 2017-09-10 DIAGNOSIS — J449 Chronic obstructive pulmonary disease, unspecified: Secondary | ICD-10-CM | POA: Diagnosis not present

## 2017-09-10 DIAGNOSIS — E785 Hyperlipidemia, unspecified: Secondary | ICD-10-CM | POA: Insufficient documentation

## 2017-09-10 DIAGNOSIS — C50912 Malignant neoplasm of unspecified site of left female breast: Secondary | ICD-10-CM | POA: Diagnosis not present

## 2017-09-10 DIAGNOSIS — M797 Fibromyalgia: Secondary | ICD-10-CM | POA: Diagnosis not present

## 2017-09-10 DIAGNOSIS — Z87891 Personal history of nicotine dependence: Secondary | ICD-10-CM | POA: Insufficient documentation

## 2017-09-10 DIAGNOSIS — E119 Type 2 diabetes mellitus without complications: Secondary | ICD-10-CM | POA: Diagnosis not present

## 2017-09-10 DIAGNOSIS — K219 Gastro-esophageal reflux disease without esophagitis: Secondary | ICD-10-CM | POA: Diagnosis not present

## 2017-09-10 DIAGNOSIS — Z79899 Other long term (current) drug therapy: Secondary | ICD-10-CM | POA: Insufficient documentation

## 2017-09-10 DIAGNOSIS — S21002A Unspecified open wound of left breast, initial encounter: Secondary | ICD-10-CM | POA: Diagnosis not present

## 2017-09-10 DIAGNOSIS — C50512 Malignant neoplasm of lower-outer quadrant of left female breast: Secondary | ICD-10-CM | POA: Diagnosis not present

## 2017-09-10 HISTORY — PX: RE-EXCISION OF BREAST LUMPECTOMY: SHX6048

## 2017-09-10 HISTORY — PX: DEBRIDEMENT AND CLOSURE WOUND: SHX5614

## 2017-09-10 LAB — GLUCOSE, CAPILLARY: Glucose-Capillary: 135 mg/dL — ABNORMAL HIGH (ref 70–99)

## 2017-09-10 SURGERY — DEBRIDEMENT, WOUND, WITH CLOSURE
Anesthesia: General | Site: Breast | Laterality: Left | Wound class: Dirty or Infected

## 2017-09-10 MED ORDER — MORPHINE SULFATE (PF) 4 MG/ML IV SOLN
2.0000 mg | INTRAVENOUS | Status: DC | PRN
  Administered 2017-09-10: 2 mg via INTRAVENOUS
  Filled 2017-09-10: qty 1

## 2017-09-10 MED ORDER — ACETAMINOPHEN 500 MG PO TABS
500.0000 mg | ORAL_TABLET | Freq: Four times a day (QID) | ORAL | Status: DC | PRN
Start: 2017-09-10 — End: 2017-09-11

## 2017-09-10 MED ORDER — ENOXAPARIN SODIUM 40 MG/0.4ML ~~LOC~~ SOLN
40.0000 mg | SUBCUTANEOUS | Status: DC
  Filled 2017-09-10: qty 0.4

## 2017-09-10 MED ORDER — LEVOTHYROXINE SODIUM 25 MCG PO TABS
25.0000 ug | ORAL_TABLET | Freq: Every day | ORAL | Status: DC
  Administered 2017-09-11: 07:00:00 25 ug via ORAL
  Filled 2017-09-10: qty 1

## 2017-09-10 MED ORDER — BUPIVACAINE-EPINEPHRINE (PF) 0.25% -1:200000 IJ SOLN
INTRAMUSCULAR | Status: AC
  Filled 2017-09-10: qty 30

## 2017-09-10 MED ORDER — DIAZEPAM 5 MG PO TABS
5.0000 mg | ORAL_TABLET | Freq: Every day | ORAL | Status: DC
  Administered 2017-09-10: 23:00:00 5 mg via ORAL
  Filled 2017-09-10: qty 1

## 2017-09-10 MED ORDER — FENTANYL CITRATE (PF) 100 MCG/2ML IJ SOLN
INTRAMUSCULAR | Status: AC
  Filled 2017-09-10: qty 2

## 2017-09-10 MED ORDER — NEOMYCIN-POLYMYXIN B GU 40-200000 IR SOLN
Status: AC
  Filled 2017-09-10: qty 2

## 2017-09-10 MED ORDER — VITAMIN C 500 MG PO TABS
500.0000 mg | ORAL_TABLET | Freq: Every day | ORAL | Status: DC
  Administered 2017-09-11: 500 mg via ORAL
  Filled 2017-09-10: qty 1

## 2017-09-10 MED ORDER — AMLODIPINE BESYLATE 5 MG PO TABS
5.0000 mg | ORAL_TABLET | Freq: Every day | ORAL | Status: DC
  Administered 2017-09-11: 10:00:00 5 mg via ORAL
  Filled 2017-09-10: qty 1

## 2017-09-10 MED ORDER — DEXAMETHASONE SODIUM PHOSPHATE 10 MG/ML IJ SOLN
INTRAMUSCULAR | Status: DC | PRN
  Administered 2017-09-10: 5 mg via INTRAVENOUS

## 2017-09-10 MED ORDER — POLYMYXIN B SULFATE 500000 UNITS IJ SOLR
INTRAMUSCULAR | Status: DC | PRN
  Administered 2017-09-10: 500 mL

## 2017-09-10 MED ORDER — MORPHINE SULFATE (PF) 4 MG/ML IV SOLN
INTRAVENOUS | Status: AC
  Filled 2017-09-10: qty 1

## 2017-09-10 MED ORDER — CO Q 10 10 MG PO CAPS: 10.0000 mg | ORAL_CAPSULE | Freq: Every day | ORAL | Status: DC

## 2017-09-10 MED ORDER — ALBUTEROL SULFATE (2.5 MG/3ML) 0.083% IN NEBU
2.5000 mg | INHALATION_SOLUTION | Freq: Two times a day (BID) | RESPIRATORY_TRACT | Status: DC | PRN
Start: 2017-09-10 — End: 2017-09-11

## 2017-09-10 MED ORDER — METOPROLOL SUCCINATE ER 25 MG PO TB24
25.0000 mg | ORAL_TABLET | Freq: Every day | ORAL | Status: DC
  Administered 2017-09-11: 25 mg via ORAL
  Filled 2017-09-10: qty 1

## 2017-09-10 MED ORDER — FENTANYL CITRATE (PF) 100 MCG/2ML IJ SOLN
INTRAMUSCULAR | Status: DC | PRN
  Administered 2017-09-10 (×6): 25 ug via INTRAVENOUS
  Administered 2017-09-10 (×2): 12.5 ug via INTRAVENOUS

## 2017-09-10 MED ORDER — HYDROCODONE-ACETAMINOPHEN 5-325 MG PO TABS
1.0000 | ORAL_TABLET | ORAL | Status: DC | PRN
  Administered 2017-09-10 – 2017-09-11 (×2): 1 via ORAL
  Filled 2017-09-10 (×2): qty 1

## 2017-09-10 MED ORDER — ONDANSETRON HCL 4 MG/2ML IJ SOLN: 4.0000 mg | Freq: Once | INTRAMUSCULAR | Status: DC | PRN

## 2017-09-10 MED ORDER — POLYETHYL GLYCOL-PROPYL GLYCOL 0.4-0.3 % OP GEL
Freq: Three times a day (TID) | OPHTHALMIC | Status: DC | PRN
  Filled 2017-09-10: qty 10

## 2017-09-10 MED ORDER — FENTANYL CITRATE (PF) 100 MCG/2ML IJ SOLN
INTRAMUSCULAR | Status: AC
  Administered 2017-09-10: 25 ug via INTRAVENOUS
  Filled 2017-09-10: qty 2

## 2017-09-10 MED ORDER — MOMETASONE FURO-FORMOTEROL FUM 200-5 MCG/ACT IN AERO
2.0000 | INHALATION_SPRAY | Freq: Two times a day (BID) | RESPIRATORY_TRACT | Status: DC
  Administered 2017-09-10 – 2017-09-11 (×2): 2 via RESPIRATORY_TRACT
  Filled 2017-09-10: qty 8.8

## 2017-09-10 MED ORDER — FAMOTIDINE 20 MG PO TABS: 20.0000 mg | ORAL_TABLET | Freq: Two times a day (BID) | ORAL | Status: DC | PRN

## 2017-09-10 MED ORDER — ONDANSETRON HCL 4 MG/2ML IJ SOLN
INTRAMUSCULAR | Status: DC | PRN
  Administered 2017-09-10: 4 mg via INTRAVENOUS

## 2017-09-10 MED ORDER — CEFAZOLIN SODIUM-DEXTROSE 2-4 GM/100ML-% IV SOLN
2.0000 g | Freq: Three times a day (TID) | INTRAVENOUS | Status: DC
  Administered 2017-09-10 – 2017-09-11 (×2): 2 g via INTRAVENOUS
  Filled 2017-09-10 (×3): qty 100

## 2017-09-10 MED ORDER — LOSARTAN POTASSIUM 50 MG PO TABS
100.0000 mg | ORAL_TABLET | Freq: Every day | ORAL | Status: DC
  Administered 2017-09-11: 100 mg via ORAL
  Filled 2017-09-10: qty 2

## 2017-09-10 MED ORDER — MAGNESIUM OXIDE 400 (241.3 MG) MG PO TABS
400.0000 mg | ORAL_TABLET | Freq: Every day | ORAL | Status: DC
  Administered 2017-09-11: 400 mg via ORAL
  Filled 2017-09-10: qty 1

## 2017-09-10 MED ORDER — CEFAZOLIN SODIUM-DEXTROSE 2-4 GM/100ML-% IV SOLN
INTRAVENOUS | Status: AC
  Filled 2017-09-10: qty 100

## 2017-09-10 MED ORDER — PANTOPRAZOLE SODIUM 40 MG PO TBEC
40.0000 mg | DELAYED_RELEASE_TABLET | Freq: Every day | ORAL | Status: DC
  Administered 2017-09-11: 10:00:00 40 mg via ORAL
  Filled 2017-09-10: qty 1

## 2017-09-10 MED ORDER — PROPOFOL 10 MG/ML IV BOLUS
INTRAVENOUS | Status: DC | PRN
  Administered 2017-09-10: 120 mg via INTRAVENOUS

## 2017-09-10 MED ORDER — VITAMIN D 1000 UNITS PO TABS
1000.0000 [IU] | ORAL_TABLET | Freq: Every day | ORAL | Status: DC
  Administered 2017-09-11: 1000 [IU] via ORAL
  Filled 2017-09-10: qty 1

## 2017-09-10 MED ORDER — LIDOCAINE HCL (CARDIAC) PF 100 MG/5ML IV SOSY
PREFILLED_SYRINGE | INTRAVENOUS | Status: DC | PRN
  Administered 2017-09-10: 60 mg via INTRAVENOUS

## 2017-09-10 MED ORDER — LACTATED RINGERS IV SOLN
INTRAVENOUS | Status: DC
  Administered 2017-09-10: 13:00:00 via INTRAVENOUS

## 2017-09-10 MED ORDER — ALBUTEROL SULFATE HFA 108 (90 BASE) MCG/ACT IN AERS: 2.0000 | INHALATION_SPRAY | RESPIRATORY_TRACT | Status: DC | PRN

## 2017-09-10 MED ORDER — FENTANYL CITRATE (PF) 100 MCG/2ML IJ SOLN
25.0000 ug | INTRAMUSCULAR | Status: DC | PRN
  Administered 2017-09-10 (×2): 25 ug via INTRAVENOUS

## 2017-09-10 SURGICAL SUPPLY — 66 items
ADH SKN CLS APL DERMABOND .7 (GAUZE/BANDAGES/DRESSINGS)
BINDER BREAST LRG (GAUZE/BANDAGES/DRESSINGS) IMPLANT
BINDER BREAST MEDIUM (GAUZE/BANDAGES/DRESSINGS) IMPLANT
BINDER BREAST XLRG (GAUZE/BANDAGES/DRESSINGS) IMPLANT
BINDER BREAST XXLRG (GAUZE/BANDAGES/DRESSINGS) ×2 IMPLANT
BLADE CLIPPER SURG (BLADE) IMPLANT
BLADE SURG 15 STRL LF DISP TIS (BLADE) ×1 IMPLANT
BLADE SURG 15 STRL SS (BLADE) ×3
BULB RESERV EVAC DRAIN JP 100C (MISCELLANEOUS) ×2 IMPLANT
CANISTER SUCT 1200ML W/VALVE (MISCELLANEOUS) ×3 IMPLANT
CHLORAPREP W/TINT 26ML (MISCELLANEOUS) ×1 IMPLANT
CLEANER CAUTERY TIP 5X5 PAD (MISCELLANEOUS) IMPLANT
CNTNR SPEC 2.5X3XGRAD LEK (MISCELLANEOUS) ×1
CONT SPEC 4OZ STER OR WHT (MISCELLANEOUS) ×2
CONT SPEC 4OZ STRL OR WHT (MISCELLANEOUS) ×1
CONTAINER SPEC 2.5X3XGRAD LEK (MISCELLANEOUS) ×1 IMPLANT
COVER BACK TABLE 60X90IN (DRAPES) ×3 IMPLANT
COVER MAYO STAND STRL (DRAPES) ×3 IMPLANT
DECANTER SPIKE VIAL GLASS SM (MISCELLANEOUS) IMPLANT
DERMABOND ADVANCED (GAUZE/BANDAGES/DRESSINGS)
DERMABOND ADVANCED .7 DNX12 (GAUZE/BANDAGES/DRESSINGS) ×1 IMPLANT
DRAIN CHANNEL JP 15F RND 16 (MISCELLANEOUS) ×2 IMPLANT
DRAPE INCISE IOBAN 66X45 STRL (DRAPES) IMPLANT
DRAPE LAPAROSCOPIC ABDOMINAL (DRAPES) IMPLANT
DRAPE LAPAROTOMY 77X122 PED (DRAPES) ×3 IMPLANT
DRAPE PED LAPAROTOMY (DRAPES) IMPLANT
DRAPE SURG 17X23 STRL (DRAPES) IMPLANT
DRSG EMULSION OIL 3X3 NADH (GAUZE/BANDAGES/DRESSINGS) IMPLANT
DRSG TEGADERM 4X10 (GAUZE/BANDAGES/DRESSINGS) IMPLANT
DRSG TELFA 3X8 NADH (GAUZE/BANDAGES/DRESSINGS) IMPLANT
ELECT REM PT RETURN 9FT ADLT (ELECTROSURGICAL) ×6
ELECTRODE REM PT RTRN 9FT ADLT (ELECTROSURGICAL) ×2 IMPLANT
GAUZE PETRO XEROFOAM 1X8 (MISCELLANEOUS) ×4 IMPLANT
GAUZE SPONGE 4X4 12PLY STRL (GAUZE/BANDAGES/DRESSINGS) ×2 IMPLANT
GLOVE BIO SURGEON STRL SZ 6.5 (GLOVE) ×9 IMPLANT
GLOVE BIO SURGEONS STRL SZ 6.5 (GLOVE) ×6
GOWN STRL REUS W/ TWL LRG LVL3 (GOWN DISPOSABLE) ×5 IMPLANT
GOWN STRL REUS W/TWL LRG LVL3 (GOWN DISPOSABLE) ×18
KIT TURNOVER KIT A (KITS) ×3 IMPLANT
LABEL OR SOLS (LABEL) ×1 IMPLANT
MARGIN MAP 10MM (MISCELLANEOUS) ×1 IMPLANT
NDL HYPO 25X1 1.5 SAFETY (NEEDLE) ×3 IMPLANT
NEEDLE HYPO 25X1 1.5 SAFETY (NEEDLE) ×9 IMPLANT
NS IRRIG 1000ML POUR BTL (IV SOLUTION) ×3 IMPLANT
PACK BASIN MINOR ARMC (MISCELLANEOUS) ×3 IMPLANT
PAD ABD DERMACEA PRESS 5X9 (GAUZE/BANDAGES/DRESSINGS) ×2 IMPLANT
PAD CLEANER CAUTERY TIP 5X5 (MISCELLANEOUS) ×2
PAD DRESSING TELFA 3X8 NADH (GAUZE/BANDAGES/DRESSINGS) IMPLANT
SPONGE LAP 18X18 RF (DISPOSABLE) ×3 IMPLANT
SUT ETHILON 3-0 FS-10 30 BLK (SUTURE) ×3
SUT MNCRL 4-0 (SUTURE) ×3
SUT MNCRL 4-0 27XMFL (SUTURE) ×1
SUT MNCRL AB 3-0 PS2 18 (SUTURE) IMPLANT
SUT MNCRL AB 3-0 PS2 27 (SUTURE) ×8 IMPLANT
SUT MNCRL AB 4-0 PS2 18 (SUTURE) ×2 IMPLANT
SUT MON AB 5-0 P3 18 (SUTURE) ×11 IMPLANT
SUT PROLENE 2 0 SH DA (SUTURE) ×2 IMPLANT
SUT SILK 2 0 SH (SUTURE) ×6 IMPLANT
SUT SILK 3 0 PS 1 (SUTURE) IMPLANT
SUT VIC AB 3-0 SH 27 (SUTURE) ×3
SUT VIC AB 3-0 SH 27X BRD (SUTURE) ×1 IMPLANT
SUTURE EHLN 3-0 FS-10 30 BLK (SUTURE) ×1 IMPLANT
SUTURE MNCRL 4-0 27XMF (SUTURE) ×1 IMPLANT
SYR 10ML LL (SYRINGE) ×3 IMPLANT
SYR BULB IRRIG 60ML STRL (SYRINGE) ×2 IMPLANT
WATER STERILE IRR 1000ML POUR (IV SOLUTION) ×3 IMPLANT

## 2017-09-10 NOTE — Op Note (Signed)
DATE OF OPERATION: 09/10/2017  LOCATION: [facility] Main Operating Room Outpatient  PREOPERATIVE DIAGNOSIS: Left breast cancer with positive margins  POSTOPERATIVE DIAGNOSIS: Same  PROCEDURE: Complex closure of 5 x 20 cm chest / breast defect  SURGEON: Defne Gerling Sanger Jaretzi Droz, DO  EBL: 20 cc  CONDITION: Stable  COMPLICATIONS: None  INDICATION: The patient, Kristin Estrada, is a 77 y.o. female born on Aug 07, 1940, is here for treatment of a chest / breast defect after a re-excision for breast cancer.   PROCEDURE DETAILS:  The patient was seen prior to surgery and marked.  The IV antibiotics were given. The patient was taken to the operating room and given a general anesthetic. A standard time out was performed and all information was confirmed by those in the room. SCDs were placed.   The patient was prepped and draped.  The general surgeon performed the re-excision of the left breast cancer.  The defect was 5 x 20 cm in size.  The superior and inferior flaps were released from the pectoralis muscle to improve the closure without tension.  Hemostasis was achieved with electrocautery.  A drain was placed and secured to the chest wall with 3-0 Silk. The deep layers were closed with 3-0 Monocryl followed by several 3-0 Vertical mattress sutures.  The 4-0 and 5-0 Monocryl was then placed with a running stitch.  Xeroform was placed with sterile dressings and a breast binder.  The patient was allowed to wake up and taken to recovery room in stable condition at the end of the case. The family was notified at the end of the case.

## 2017-09-10 NOTE — Anesthesia Post-op Follow-up Note (Signed)
Anesthesia QCDR form completed.        

## 2017-09-10 NOTE — Transfer of Care (Signed)
Immediate Anesthesia Transfer of Care Note  Patient: Kristin Estrada  Procedure(s) Performed: CLOSURE OF LEFT BREAST MASTECTOMY/WOUND (Left Breast) RE-EXCISION OF BREAST LUMPECTOMY (Left Breast)  Patient Location: PACU  Anesthesia Type:General  Level of Consciousness: sedated  Airway & Oxygen Therapy: Patient Spontanous Breathing and Patient connected to face mask oxygen  Post-op Assessment: Report given to RN and Post -op Vital signs reviewed and stable  Post vital signs: Reviewed and stable  Last Vitals:  Vitals Value Taken Time  BP 145/61 09/10/2017  3:19 PM  Temp 36.2 C 09/10/2017  3:19 PM  Pulse 71 09/10/2017  3:20 PM  Resp 10 09/10/2017  3:20 PM  SpO2 99 % 09/10/2017  3:20 PM  Vitals shown include unvalidated device data.  Last Pain:  Vitals:   09/10/17 1210  TempSrc:   PainSc: 0-No pain         Complications: No apparent anesthesia complications

## 2017-09-10 NOTE — Anesthesia Procedure Notes (Signed)
Procedure Name: LMA Insertion Date/Time: 09/10/2017 1:30 PM Performed by: Hedda Slade, CRNA Pre-anesthesia Checklist: Patient identified, Patient being monitored, Timeout performed, Emergency Drugs available and Suction available Patient Re-evaluated:Patient Re-evaluated prior to induction Oxygen Delivery Method: Circle system utilized Preoxygenation: Pre-oxygenation with 100% oxygen Induction Type: IV induction Ventilation: Mask ventilation without difficulty LMA: LMA inserted LMA Size: 4.0 Tube type: Oral Number of attempts: 1 Placement Confirmation: positive ETCO2 and breath sounds checked- equal and bilateral Tube secured with: Tape Dental Injury: Teeth and Oropharynx as per pre-operative assessment

## 2017-09-10 NOTE — Progress Notes (Signed)
PHARMACIST - PHYSICIAN ORDER COMMUNICATION  CONCERNING: P&T Medication Policy on Herbal Medications  DESCRIPTION:  This patient's order for:  Co-Q 10 CAPs  has been noted.  This product(s) is classified as an "herbal" or natural product. Due to a lack of definitive safety studies or FDA approval, nonstandard manufacturing practices, plus the potential risk of unknown drug-drug interactions while on inpatient medications, the Pharmacy and Therapeutics Committee does not permit the use of "herbal" or natural products of this type within [facility].   ACTION TAKEN: The pharmacy department is unable to verify this order at this time.  Please reevaluate patient's clinical condition at discharge and address if the herbal or natural product(s) should be resumed at that time.  Pernell Dupre, PharmD, BCPS Clinical Pharmacist 09/10/2017 8:55 PM

## 2017-09-10 NOTE — Op Note (Signed)
Preoperative diagnosis: Left breast cancer with positive margins.  Postoperative diagnosis: Left breast cancer with positive margins.  Procedure: Left breast re-excision of previous mastectomy  Anesthesia: GETA  Surgeon: Dr. Windell Moment        Dr. Marla Roe  Wound Classification: Contaminated  Indications: Patient is a 77 y.o. female who had previous total mastectomy of the left breast due to a T4 breast mass. On final pathology the patient had a small focus of invasive carcinoma on the inferior margins. Re excision was needed. Plastic and Reconstructive surgery needed for complex closure.   Findings: 1. Superior margin necrotic blister excised 2. New inferior margin done on grossly clean skin 1 cm away from previous margin.  3. No purulent secretions identified.  4. Adequate hemostasis.  5. No suspicious mass seen.  6. 5 cm x 20 cm excision of previous mastectomy done.   Description of procedure: The patient was brought to the operating room and general anesthesia (LMA) was induced. A time-out was completed verifying correct patient, procedure, site, positioning, and implant(s) and/or special equipment prior to beginning this procedure. The breast, chest wall, axilla, and upper arm and neck were prepped and draped in the usual sterile fashion.  An elliptical skin incision was made that encompassed the previous scar and passed in an oblique direction across the breast. Dissection was carried down to the muscle and the 5 cm x 20 cm tissue excision was done. The Plastic and Reconstructive Surgery service raised the flaps up to he clavicle superiorly, the sternum medially, the anterior rectus sheath inferiorly, and past the lateral border of the pectoralis major muscle laterally. Hemostasis was achieved in the flaps. Complex closure of the mastectomy was done by Plastic and Reconstructive Service. See separate report.  The patient tolerated the procedure well and was taken to the  postanesthesia care unit in stable condition.   Specimen: Left Mastectomy wound (Short suture superior, Long suture latera)  Complications: None  Estimated Blood Loss: 50 mL

## 2017-09-10 NOTE — Interval H&P Note (Signed)
History and Physical Interval Note:  09/10/2017 1:40 PM  Kristin Estrada  has presented today for surgery, with the diagnosis of Malignant neoplasm of left female breast, unspecified estrogen receptor status, unspecified site of breast  The various methods of treatment have been discussed with the patient and family. After consideration of risks, benefits and other options for treatment, the patient has consented to  Procedure(s): CLOSURE OF LEFT BREAST MASTECTOMY/WOUND (Left) RE-EXCISION OF BREAST LUMPECTOMY (Left) as a surgical intervention .  The patient's history has been reviewed, patient examined, no change in status, stable for surgery.  I have reviewed the patient's chart and labs.  Questions were answered to the patient's satisfaction.     [name] [name]

## 2017-09-10 NOTE — Anesthesia Preprocedure Evaluation (Addendum)
Anesthesia Evaluation  Patient identified by MRN, date of birth, ID band Patient awake    Reviewed: Allergy & Precautions, NPO status , Patient's Chart, lab work & pertinent test results, reviewed documented beta blocker date and time   Airway Mallampati: III  TM Distance: >3 FB     Dental  (+) Chipped, Upper Dentures, Partial Lower   Pulmonary shortness of breath, pneumonia, resolved, COPD, former smoker,           Cardiovascular hypertension, Pt. on medications and Pt. on home beta blockers      Neuro/Psych  Headaches,  Neuromuscular disease    GI/Hepatic GERD  Controlled,  Endo/Other  diabetes, Type 2Hypothyroidism   Renal/GU      Musculoskeletal  (+) Arthritis , Fibromyalgia -  Abdominal   Peds  Hematology   Anesthesia Other Findings EKG ok.  Reproductive/Obstetrics                             Anesthesia Physical  Anesthesia Plan  ASA: III  Anesthesia Plan: General   Post-op Pain Management:    Induction: Intravenous  PONV Risk Score and Plan:   Airway Management Planned: Oral ETT and LMA  Additional Equipment:   Intra-op Plan:   Post-operative Plan:   Informed Consent: I have reviewed the patients History and Physical, chart, labs and discussed the procedure including the risks, benefits and alternatives for the proposed anesthesia with the patient or authorized representative who has indicated his/her understanding and acceptance.     Plan Discussed with: CRNA  Anesthesia Plan Comments:        Anesthesia Quick Evaluation

## 2017-09-10 NOTE — Interval H&P Note (Signed)
History and Physical Interval Note:  09/10/2017 1:11 PM  Kristin Estrada  has presented today for surgery, with the diagnosis of Malignant neoplasm of left female breast, unspecified estrogen receptor status, unspecified site of breast  The various methods of treatment have been discussed with the patient and family. After consideration of risks, benefits and other options for treatment, the patient has consented to  Procedure(s): CLOSURE OF LEFT BREAST MASTECTOMY/WOUND (Left) RE-EXCISION OF BREAST LUMPECTOMY (Left) as a surgical intervention .  The patient's history has been reviewed, patient examined, no change in status, stable for surgery.  I have reviewed the patient's chart and labs.  Left chest marked in the pre procedure room. Questions were answered to the patient's satisfaction.     [name]

## 2017-09-10 NOTE — H&P (Signed)
Kristin Estrada is an 77 y.o. female.   Chief Complaint: left breast caner HPI: The patient is a 77 y.o. old wf here for treatment of left breast mastectomy site and wound.  She felt pain in the left breast and underwent a biopsy of the breast skin, mammogram and ultrasound.  On 07/09/17 she was found to have invasive mammary carcinoma.  Neoadjuvant chemotherapy is planned and a port-a-cath was placed.  She underwent a left mastectomy 7/3.  There was a positive margin and she has a blister with discoloration of the skin.  She quit smoking 13 years ago.  She has good family support.  The discolored area is 2 x 6 cm and likely can be included in the new margin.  There was no sign of a hematoma or seroma.     Past Medical History:  Diagnosis Date  . Arthritis   . Bronchitis   . Bruises easily   . Cancer (HCC)    breast  . Cataracts, bilateral   . Chronic airway obstruction, not elsewhere classified    Symbicort daily and Albuterol daily as needed  . Chronic back pain    arthritis  . Chronic bronchitis ([place])   . Cramps of left lower extremity   . Diabetes mellitus without complication ([place])    borderline-diet and exercise;takes Cinnamon daily  . Esophageal reflux    takes Nexium daily and Dexilant daily as needed  . Fibromyalgia   . Fibromyalgia   . Headache    occasionally  . HOH (hard of hearing)   . Hyperlipidemia    takes CO Q10 daily  . Hypothyroidism   . Insomnia    takes Ambien and Trazodone nightly  . Joint pain   . Joint swelling   . Macular degeneration, right eye    wet and to get injection tomorrow  . Pneumonia    hx of x 3 with most recent one 2009  . Seasonal allergies    takes Zyrtec daily as well as using Nasonex  . Shortness of breath dyspnea   . Shoulder fracture   . Unspecified essential hypertension    takes Amlodipine and Losartan daily  . URI (upper respiratory infection)    was on Prednisone and Antibiotic-to complete today  . Urinary urgency      Past Surgical History:  Procedure Laterality Date  . BREAST SURGERY  1972   nodules removed and benign  . CATARACT EXTRACTION W/PHACO Left 12/31/2014   Procedure: CATARACT EXTRACTION PHACO AND INTRAOCULAR LENS PLACEMENT (IOC);  Surgeon: Estill Cotta, MD;  Location: [facility] ORS;  Service: Ophthalmology;  Laterality: Left;  [place] 01:00 AP% 24.0 CDE 25.53 fluid pack # [id number]  . CATARACT EXTRACTION W/PHACO Right 04/27/2017   Procedure: CATARACT EXTRACTION PHACO AND INTRAOCULAR LENS PLACEMENT (IOC);  Surgeon: Birder Robson, MD;  Location: [facility] ORS;  Service: Ophthalmology;  Laterality: Right;  [place] 00:46.3 AP% 12.7 CDE 5.86 Fluid Pack lot # [id number]  . JOINT REPLACEMENT     SHOULDER left  . MASTECTOMY W/ SENTINEL NODE BIOPSY Left 08/18/2017   Procedure: MASTECTOMY WITH SENTINEL LYMPH NODE BIOPSY;  Surgeon: [name], MD;  Location: [facility] ORS;  Service: General;  Laterality: Left;  . REVERSE SHOULDER ARTHROPLASTY Left 04/19/2014   Procedure:  REVERSE TOTAL SHOULDER ARTHROPLASTY;  Surgeon: Marin Shutter, MD;  Location: [place];  Service: Orthopedics;  Laterality: Left;    Family History  Problem Relation Age of Onset  . Heart disease Unknown   . Breast cancer  Neg Hx    Social History:  reports that she quit smoking about 13 years ago. Her smoking use included cigarettes. She has a 60.00 pack-year smoking history. She has never used smokeless tobacco. She reports that she does not drink alcohol or use drugs.  Allergies:  Allergies  Allergen Reactions  . Protonix [Pantoprazole Sodium] Other (See Comments)    abd cramps  . Statins Other (See Comments)    Legs hurt    No medications prior to admission.    No results found for this or any previous visit (from the past 48 hour(s)). No results found.  Review of Systems  Constitutional: Negative.   HENT: Negative.   Eyes: Negative.   Respiratory: Negative.   Cardiovascular: Negative.   Gastrointestinal: Negative.    Genitourinary: Negative.   Musculoskeletal: Negative.   Skin: Negative.   Neurological: Negative.   Psychiatric/Behavioral: Negative.     There were no vitals taken for this visit. Physical Exam  Constitutional: She is oriented to person, place, and time. She appears well-developed and well-nourished.  HENT:  Head: Normocephalic and atraumatic.  Eyes: Pupils are equal, round, and reactive to light. EOM are normal.  Cardiovascular: Normal rate.  Respiratory: Effort normal.  GI: Soft.  Neurological: She is alert and oriented to person, place, and time.  Skin: Skin is warm.  Psychiatric: She has a normal mood and affect. Her behavior is normal. Judgment and thought content normal.     Assessment/Plan  Plan to assist with closure in [place] on left breast at the time of the re-excision  [place], DO 09/10/2017, 8:18 AM

## 2017-09-11 DIAGNOSIS — C50912 Malignant neoplasm of unspecified site of left female breast: Secondary | ICD-10-CM | POA: Diagnosis not present

## 2017-09-11 NOTE — Discharge Instructions (Signed)
°  Diet: Resume home heart healthy regular diet.   Activity: No heavy lifting >20 pounds (children, pets, laundry, garbage) or strenuous activity until follow-up, but light activity and walking are encouraged. Do not drive or drink alcohol if taking narcotic pain medications. Does not extend your left upper extremity over your shoulder.   Wound care: Remove dressing Monday. Keep area clean with soap and water.    Medications: Resume all home medications. For mild to moderate pain: acetaminophen (Tylenol) or ibuprofen (if no kidney disease). Combining Tylenol with alcohol can substantially increase your risk of causing liver disease. Narcotic pain medications, if prescribed, can be used for severe pain, though may cause nausea, constipation, and drowsiness. Do not combine Tylenol and Norco within a 6 hour period as Norco contains Tylenol. If you do not need the narcotic pain medication, you do not need to fill the prescription.  Call office [phone]) at any time if any questions, worsening pain, fevers/chills, bleeding, drainage from incision site, or other concerns.

## 2017-09-11 NOTE — Progress Notes (Signed)
MD order received to discharge pt home today; verbally reviewed AVS with pt, no new Rxs; no questions voiced at this time; pt discharged via wheelchair by nursing to the visitor's entrance

## 2017-09-11 NOTE — Discharge Summary (Signed)
Physician Discharge Summary  Patient ID: Kristin Estrada MRN: [id number] DOB/AGE: January 12, 1941 77 y.o.  Admit date: 09/10/2017 Discharge date: 09/11/2017  Admission Diagnoses:  Discharge Diagnoses:  Active Problems:   Breast cancer [facility])   Discharged Condition: good  Hospital Course: Patient admitted for re excision of total mastectomy due to positive margins and complex closure. Due to pain and difficulty of patient's taking care of wounds patient was left on observation. Local care given. Pain controlled.   Consults: None  Significant Diagnostic Studies: none  Treatments: surgery: Re excision of total mastectomy and complex closure of wound.   Discharge Exam: Blood pressure (!) 143/56, pulse 74, temperature 98 F (36.7 C), temperature source Oral, resp. rate 18, height 5\' 2"  (1.575 m), weight 78.6 kg (173 lb 5 oz), SpO2 94 %. General appearance: alert and cooperative Chest wall: no tenderness, Wound without hematoma, viable flaps.  Incision/Wound:dry and clean  Disposition: Discharge disposition: 01-Home or Self Care       Discharge Instructions    Diet - low sodium heart healthy   Complete by:  As directed    Increase activity slowly   Complete by:  As directed      Allergies as of 09/11/2017      Reactions   Protonix [pantoprazole Sodium] Other (See Comments)   abd cramps   Statins Other (See Comments)   Legs hurt      Medication List    TAKE these medications   acetaminophen 500 MG tablet Commonly known as:  TYLENOL Take 500 mg by mouth every 6 (six) hours as needed for moderate pain or headache.   albuterol (2.5 MG/3ML) 0.083% nebulizer solution Commonly known as:  PROVENTIL Take 2.5 mg by nebulization 2 (two) times daily as needed for wheezing or shortness of breath. What changed:  Another medication with the same name was changed. Make sure you understand how and when to take each.   albuterol 108 (90 Base) MCG/ACT inhaler Commonly known as:   PROAIR HFA Inhale 2 puffs into the lungs every 4 (four) hours as needed. What changed:  reasons to take this   amLODipine 5 MG tablet Commonly known as:  NORVASC Take 5 mg by mouth daily.   AVASTIN IV Place 1 Dose into both eyes every 30 (thirty) days.   cetirizine 10 MG tablet Commonly known as:  ZYRTEC Take 10 mg by mouth at bedtime.   cholecalciferol 1000 units tablet Commonly known as:  VITAMIN D Take 1,000 Units by mouth daily.   clotrimazole 1 % cream Commonly known as:  LOTRIMIN Apply 1 application topically 2 (two) times daily as needed (for yeast infection).   Co Q 10 10 MG Caps Take 10 mg by mouth daily.   [employer] LEG CRAMPS PAIN RELIEF PO Take 2 tablets by mouth every 4 (four) hours as needed (for leg cramps).   dextromethorphan-guaiFENesin 30-600 MG 12hr tablet Commonly known as:  MUCINEX DM Take 1 tablet by mouth every 12 (twelve) hours as needed for cough.   diazepam 5 MG tablet Commonly known as:  VALIUM Take 0.5-1 tablets (2.5-5 mg total) by mouth every 6 (six) hours as needed for muscle spasms or sedation. What changed:    how much to take  when to take this   diclofenac 75 MG EC tablet Commonly known as:  VOLTAREN Take 75 mg by mouth 2 (two) times daily.   esomeprazole 40 MG capsule Commonly known as:  NEXIUM Take 40 mg by mouth daily before breakfast.  famotidine 20 MG tablet Commonly known as:  PEPCID Take 1 tablet (20 mg total) by mouth 2 (two) times daily. When coughing What changed:    when to take this  reasons to take this  additional instructions   HYDROcodone-acetaminophen 5-325 MG tablet Commonly known as:  NORCO/VICODIN Take 1 tablet by mouth every 4 (four) hours as needed for moderate pain.   levothyroxine 25 MCG tablet Commonly known as:  SYNTHROID, LEVOTHROID Take 25 mcg by mouth daily before breakfast.   losartan 100 MG tablet Commonly known as:  COZAAR Take 100 mg by mouth daily.   Magnesium 400 MG Caps Take 400  mg by mouth See admin instructions. Take 400 mg by mouth in the morning every day and take 400 mg by mouth 3 times weekly at bedtime   metoprolol succinate 25 MG 24 hr tablet Commonly known as:  TOPROL-XL Take 25 mg by mouth daily.   mometasone 50 MCG/ACT nasal spray Commonly known as:  NASONEX Place 1 spray into the nose daily.   multivitamin with minerals Tabs tablet Take 1 tablet by mouth daily.   nystatin 100000 UNIT/ML suspension Commonly known as:  MYCOSTATIN Take 5 mLs (500,000 Units total) by mouth as needed. What changed:    when to take this  reasons to take this   SYMBICORT 160-4.5 MCG/ACT inhaler Generic drug:  budesonide-formoterol Inhale 2 puffs into the lungs 2 (two) times daily.   SYSTANE OP Place 1 drop into both eyes 3 (three) times daily as needed (for dry eyes (at least once daily)).   vitamin C 500 MG tablet Commonly known as:  ASCORBIC ACID Take 500 mg by mouth daily.      Follow-up Information    [name], MD Follow up on 09/14/2017.   Specialty:  General Surgery Contact information: [place] [address] [phone]           Signed: [name] 09/11/2017, 11:04 AM

## 2017-09-11 NOTE — Care Management Obs Status (Signed)
MEDICARE OBSERVATION STATUS NOTIFICATION   Patient Details  Name: GWENDOLYNE WELFORD MRN: [id number] Date of Birth: 21-Aug-1940   Medicare Observation Status Notification Given:  Yes    Atira Borello A Katrina Daddona, RN 09/11/2017, 7:53 AM

## 2017-09-12 ENCOUNTER — Encounter: Payer: Self-pay | Admitting: Plastic Surgery

## 2017-09-13 LAB — SURGICAL PATHOLOGY

## 2017-09-14 NOTE — Anesthesia Postprocedure Evaluation (Signed)
Anesthesia Post Note  Patient: Kristin Estrada  Procedure(s) Performed: CLOSURE OF LEFT BREAST MASTECTOMY/WOUND (Left Breast) RE-EXCISION OF BREAST LUMPECTOMY (Left Breast)  Patient location during evaluation: PACU Anesthesia Type: General Level of consciousness: awake and alert and oriented Pain management: pain level controlled Vital Signs Assessment: post-procedure vital signs reviewed and stable Respiratory status: spontaneous breathing Cardiovascular status: blood pressure returned to baseline Anesthetic complications: no     Last Vitals:  Vitals:   09/11/17 0200 09/11/17 0521  BP: (!) 133/54 (!) 143/56  Pulse: 79 74  Resp:  18  Temp: 36.8 C 36.7 C  SpO2: 95% 94%    Last Pain:  Vitals:   09/11/17 0954  TempSrc:   PainSc: 0-No pain                 Chareese Sergent

## 2017-09-16 ENCOUNTER — Encounter: Payer: Self-pay | Admitting: *Deleted

## 2017-09-16 NOTE — Progress Notes (Signed)
  Oncology Nurse Navigator Documentation  Navigator Location: [facility] (09/16/17 1500)   )Navigator Encounter Type: Telephone (09/16/17 1500) Telephone: Lahoma Crocker Call (09/16/17 1500)                       Barriers/Navigation Needs: Education (09/16/17 1500) Education: Other (09/16/17 1500)                        Time Spent with Patient: 30 (09/16/17 1500)   I spoke with Dr. Peyton Najjar on Monday and he requested that I call patient with her OncotypeDx results.  States patient is anxious and doesn't understand why her appointment with Dr. Grayland Ormond was changed.  Dr. Grayland Ormond and his team are on vacation this week.  I called patient today and reviewed low risk score of recurrence on her Oncotype Dx test and per those results there is no benefit to chemotherapy.  She was informed that Dr. Grayland Ormond is on vacation and he will review results again with her at her appointment in August.  States she is doing well since her surgery.  She is to call with any questions or needs.

## 2017-09-21 DIAGNOSIS — J449 Chronic obstructive pulmonary disease, unspecified: Secondary | ICD-10-CM | POA: Diagnosis not present

## 2017-09-21 DIAGNOSIS — F418 Other specified anxiety disorders: Secondary | ICD-10-CM | POA: Diagnosis not present

## 2017-09-21 DIAGNOSIS — I10 Essential (primary) hypertension: Secondary | ICD-10-CM | POA: Diagnosis not present

## 2017-09-21 DIAGNOSIS — E119 Type 2 diabetes mellitus without complications: Secondary | ICD-10-CM | POA: Diagnosis not present

## 2017-09-26 NOTE — Progress Notes (Signed)
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  Oncotype DX score 16, low risk.  INTERVAL HISTORY: Patient returns to clinic today for further evaluation, discussion of her final pathology results, and additional treatment planning.  She continues to be anxious, but otherwise feels well. She has no neurologic complaints.  She denies any recent fevers or illnesses.  She has a good appetite and denies weight loss.  She denies any pain.  She has no chest pain or shortness of breath.  She denies any nausea, vomiting, constipation, or diarrhea.  She has no urinary complaints.  Patient offers no specific complaints today.  REVIEW OF SYSTEMS:   Review of Systems  Constitutional: Negative.  Negative for fever, malaise/fatigue and weight loss.  Respiratory: Negative.  Negative for cough and shortness of breath.   Cardiovascular: Negative.  Negative for chest pain and leg swelling.  Gastrointestinal: Negative.  Negative for abdominal pain.  Genitourinary: Negative.  Negative for dysuria.  Musculoskeletal: Negative.  Negative for back pain.  Skin: Negative.  Negative for rash.  Neurological: Negative.  Negative for sensory change, focal weakness and weakness.  Psychiatric/Behavioral: The patient is nervous/anxious.     As per HPI. Otherwise, a complete review of systems is negative.  PAST MEDICAL HISTORY: Past Medical History:  Diagnosis Date  . Arthritis   . Bronchitis   . Bruises easily   . Cancer (HCC)    breast  . Cataracts, bilateral   . Chronic airway obstruction, not  elsewhere classified    Symbicort daily and Albuterol daily as needed  . Chronic back pain    arthritis  . Chronic bronchitis ([place])   . Cramps of left lower extremity   . Diabetes mellitus without complication ([place])    borderline-diet and exercise;takes Cinnamon daily  . Esophageal reflux    takes Nexium daily and Dexilant daily as needed  . Fibromyalgia   . Fibromyalgia   . Headache    occasionally  . HOH (hard of hearing)   . Hyperlipidemia    takes CO Q10 daily  . Hypothyroidism   . Insomnia    takes Ambien and Trazodone nightly  . Joint pain   . Joint swelling   . Macular degeneration, right eye    wet and to get injection tomorrow  . Pneumonia    hx of x 3 with most recent one 2009  . Seasonal allergies    takes Zyrtec daily as well as using Nasonex  . Shortness of breath dyspnea   . Shoulder fracture   . Unspecified essential hypertension    takes Amlodipine and Losartan daily  . URI (upper respiratory infection)    was on Prednisone and Antibiotic-to complete today  . Urinary urgency     PAST SURGICAL HISTORY: Past Surgical History:  Procedure Laterality Date  . BREAST SURGERY  1972   nodules removed and benign  . CATARACT EXTRACTION W/PHACO Left 12/31/2014   Procedure: CATARACT EXTRACTION PHACO AND INTRAOCULAR LENS PLACEMENT (IOC);  Surgeon: Estill Cotta, MD;  Location: [facility];  Service: Ophthalmology;  Laterality: Left;  [place] 01:00 AP% 24.0 CDE 25.53 fluid pack # [id number]  .  CATARACT EXTRACTION W/PHACO Right 04/27/2017   Procedure: CATARACT EXTRACTION PHACO AND INTRAOCULAR LENS PLACEMENT (IOC);  Surgeon: Birder Robson, MD;  Location: [facility];  Service: Ophthalmology;  Laterality: Right;  [place] 00:46.3 AP% 12.7 CDE 5.86 Fluid Pack lot # [id number]  . DEBRIDEMENT AND CLOSURE WOUND Left 09/10/2017   Procedure: CLOSURE OF LEFT BREAST MASTECTOMY/WOUND;  Surgeon: Wallace Going, DO;  Location: [facility];  Service: Plastics;  Laterality: Left;  . JOINT  REPLACEMENT     SHOULDER left  . MASTECTOMY W/ SENTINEL NODE BIOPSY Left 08/18/2017   Procedure: MASTECTOMY WITH SENTINEL LYMPH NODE BIOPSY;  Surgeon: [name], MD;  Location: [facility];  Service: General;  Laterality: Left;  . RE-EXCISION OF BREAST LUMPECTOMY Left 09/10/2017   Procedure: RE-EXCISION OF BREAST LUMPECTOMY;  Surgeon: [name], MD;  Location: [facility];  Service: General;  Laterality: Left;  . REVERSE SHOULDER ARTHROPLASTY Left 04/19/2014   Procedure:  REVERSE TOTAL SHOULDER ARTHROPLASTY;  Surgeon: Marin Shutter, MD;  Location: [place];  Service: Orthopedics;  Laterality: Left;    FAMILY HISTORY: Family History  Problem Relation Age of Onset  . Heart disease Unknown   . Breast cancer Neg Hx     ADVANCED DIRECTIVES (Y/N):  N  HEALTH MAINTENANCE: Social History   Tobacco Use  . Smoking status: Former Smoker    Packs/day: 2.00    Years: 30.00    Pack years: 60.00    Types: Cigarettes    Last attempt to quit: 02/17/2004    Years since quitting: 13.6  . Smokeless tobacco: Never Used  . Tobacco comment: quit smoking in 2006  Substance Use Topics  . Alcohol use: No    Alcohol/week: 0.0 standard drinks  . Drug use: No     Colonoscopy:  PAP:  Bone density:  Lipid panel:  Allergies  Allergen Reactions  . Protonix [Pantoprazole Sodium] Other (See Comments)    abd cramps  . Statins Other (See Comments)    Legs hurt    Current Outpatient Medications  Medication Sig Dispense Refill  . acetaminophen (TYLENOL) 500 MG tablet Take 500 mg by mouth every 6 (six) hours as needed for moderate pain or headache.     . albuterol (PROAIR HFA) 108 (90 BASE) MCG/ACT inhaler Inhale 2 puffs into the lungs every 4 (four) hours as needed. (Patient taking differently: Inhale 2 puffs into the lungs every 4 (four) hours as needed for wheezing or shortness of breath. ) 1 Inhaler 3  . albuterol (PROVENTIL) (2.5 MG/3ML) 0.083% nebulizer solution Take 2.5 mg by  nebulization 2 (two) times daily as needed for wheezing or shortness of breath.     Marland Kitchen amLODipine (NORVASC) 5 MG tablet Take 5 mg by mouth daily.    . Bevacizumab (AVASTIN IV) Place 1 Dose into both eyes every 30 (thirty) days.     . cetirizine (ZYRTEC) 10 MG tablet Take 10 mg by mouth at bedtime.     . cholecalciferol (VITAMIN D) 1000 UNITS tablet Take 1,000 Units by mouth daily.    . clotrimazole (LOTRIMIN) 1 % cream Apply 1 application topically 2 (two) times daily as needed (for yeast infection).    . Coenzyme Q10 (CO Q 10) 10 MG CAPS Take 10 mg by mouth daily.     Marland Kitchen dextromethorphan-guaiFENesin (MUCINEX DM) 30-600 MG per 12 hr tablet Take 1 tablet by mouth every 12 (twelve) hours as needed for cough.     . diazepam (VALIUM) 5 MG tablet Take 0.5-1 tablets (  2.5-5 mg total) by mouth every 6 (six) hours as needed for muscle spasms or sedation. (Patient taking differently: Take 5 mg by mouth at bedtime. ) 40 tablet 1  . diclofenac (VOLTAREN) 75 MG EC tablet Take 75 mg by mouth 2 (two) times daily.      Marland Kitchen esomeprazole (NEXIUM) 40 MG capsule Take 40 mg by mouth daily before breakfast.     . famotidine (PEPCID) 20 MG tablet Take 1 tablet (20 mg total) by mouth 2 (two) times daily. When coughing (Patient taking differently: Take 20 mg by mouth 2 (two) times daily as needed (for coughing). ) 60 tablet 6  . Homeopathic Products ([employer] LEG CRAMPS PAIN RELIEF PO) Take 2 tablets by mouth every 4 (four) hours as needed (for leg cramps).     Marland Kitchen HYDROcodone-acetaminophen (NORCO/VICODIN) 5-325 MG tablet Take 1 tablet by mouth every 4 (four) hours as needed for moderate pain. 15 tablet 0  . levothyroxine (SYNTHROID, LEVOTHROID) 25 MCG tablet Take 25 mcg by mouth daily before breakfast.    . losartan (COZAAR) 100 MG tablet Take 100 mg by mouth daily.     . Magnesium 400 MG CAPS Take 400 mg by mouth See admin instructions. Take 400 mg by mouth in the morning every day and take 400 mg by mouth 3 times weekly at bedtime     . metoprolol succinate (TOPROL-XL) 25 MG 24 hr tablet Take 25 mg by mouth daily.    . mometasone (NASONEX) 50 MCG/ACT nasal spray Place 1 spray into the nose daily. 17 g 11  . Multiple Vitamin (MULTIVITAMIN WITH MINERALS) TABS tablet Take 1 tablet by mouth daily.    Marland Kitchen nystatin (MYCOSTATIN) 100000 UNIT/ML suspension Take 5 mLs (500,000 Units total) by mouth as needed. (Patient taking differently: Take 5 mLs by mouth daily as needed (for mouth due to inhaler). ) 60 mL 5  . Polyethyl Glycol-Propyl Glycol (SYSTANE OP) Place 1 drop into both eyes 3 (three) times daily as needed (for dry eyes (at least once daily)).     . SYMBICORT 160-4.5 MCG/ACT inhaler Inhale 2 puffs into the lungs 2 (two) times daily. 30.6 Inhaler 2  . vitamin C (ASCORBIC ACID) 500 MG tablet Take 500 mg by mouth daily.     No current facility-administered medications for this visit.     OBJECTIVE: Vitals:   09/28/17 1007  BP: (!) 171/68  Pulse: 69  Resp: 20  Temp: (!) 97.5 F (36.4 C)     Body mass index is 32.06 kg/m.    ECOG FS:0 - Asymptomatic  General: Well-developed, well-nourished, no acute distress. Eyes: Pink conjunctiva, anicteric sclera. HEENT: Normocephalic, moist mucous membranes. Breast: Well-healing left mastectomy scar. Lungs: Clear to auscultation bilaterally. Heart: Regular rate and rhythm. No rubs, murmurs, or gallops. Abdomen: Soft, nontender, nondistended. No organomegaly noted, normoactive bowel sounds. Musculoskeletal: No edema, cyanosis, or clubbing. Neuro: Alert, answering all questions appropriately. Cranial nerves grossly intact. Skin: No rashes or petechiae noted. Psych: Normal affect.  LAB RESULTS:  Lab Results  Component Value Date   NA 140 04/10/2014   K 4.1 04/10/2014   CL 104 04/10/2014   CO2 25 04/10/2014   GLUCOSE 107 (H) 04/10/2014   BUN 17 04/10/2014   CREATININE 0.73 08/18/2017   CALCIUM 9.0 04/10/2014   GFRNONAA >60 08/18/2017   GFRAA >60 08/18/2017    Lab  Results  Component Value Date   WBC 12.3 (H) 09/03/2017   HGB 12.8 09/03/2017   HCT 38.0 09/03/2017  MCV 86.8 09/03/2017   PLT 426 09/03/2017     STUDIES: No results found.  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  Oncotype DX score 16 which is low risk. PLAN:   1.  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  She was noted to have micrometastasis in her sentinel lymph node.  It is unlikely she will require adjuvant XRT, but have referred to radiation oncology for evaluation.  She would definitely benefit from an aromatase inhibitor for 5 years.  Patient also may derive benefit for 2 additional years given her positive lymph node status.  Return to clinic after her radiation oncology appointment for further evaluation.   I spent a total of 30 minutes face-to-face with the patient of which greater than 50% of the visit was spent in counseling and coordination of care as detailed above.   Patient expressed understanding and was in agreement with this plan. She also understands that She can call clinic at any time with any questions, concerns, or complaints.   Cancer Staging Primary cancer of lower outer quadrant of left female breast [facility]) Staging form: Breast, AJCC 8th Edition - Clinical stage from 07/27/2017: Stage IIIB (cT4b, cN70m, cM0, G1, ER+, PR+, HER2-) - Signed by FLloyd Huger MD on 08/31/2017   TLloyd Huger MD   10/01/2017 3:52 PM

## 2017-09-28 ENCOUNTER — Inpatient Hospital Stay: Payer: PPO | Attending: Oncology | Admitting: Oncology

## 2017-09-28 ENCOUNTER — Encounter: Payer: Self-pay | Admitting: Oncology

## 2017-09-28 VITALS — BP 171/68 | HR 69 | Temp 97.5°F | Resp 20 | Wt 175.3 lb

## 2017-09-28 DIAGNOSIS — Z170 Estrogen receptor positive status [ER+]: Secondary | ICD-10-CM | POA: Diagnosis not present

## 2017-09-28 DIAGNOSIS — C50512 Malignant neoplasm of lower-outer quadrant of left female breast: Secondary | ICD-10-CM

## 2017-09-28 NOTE — Progress Notes (Signed)
Patient here today for follow up regarding breast cancer . 

## 2017-10-04 DIAGNOSIS — H353212 Exudative age-related macular degeneration, right eye, with inactive choroidal neovascularization: Secondary | ICD-10-CM | POA: Diagnosis not present

## 2017-10-05 NOTE — Progress Notes (Signed)
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  Oncotype DX score 16, low risk.  INTERVAL HISTORY: Patient returns to clinic for further evaluation and additional questions regarding adjuvant XRT.  She met with radiation oncology earlier this morning and is leaning toward adjuvant treatment.  She continues to be anxious, but otherwise feels well. She has no neurologic complaints.  She denies any recent fevers or illnesses.  She has a good appetite and denies weight loss.  She denies any pain.  She has no chest pain or shortness of breath.  She denies any nausea, vomiting, constipation, or diarrhea.  She has no urinary complaints.  Patient offers no specific complaints today.  REVIEW OF SYSTEMS:   Review of Systems  Constitutional: Negative.  Negative for fever, malaise/fatigue and weight loss.  Respiratory: Negative.  Negative for cough and shortness of breath.   Cardiovascular: Negative.  Negative for chest pain and leg swelling.  Gastrointestinal: Negative.  Negative for abdominal pain.  Genitourinary: Negative.  Negative for dysuria.  Musculoskeletal: Negative.  Negative for back pain.  Skin: Negative.  Negative for rash.  Neurological: Negative.  Negative for sensory change, focal weakness and weakness.  Psychiatric/Behavioral: The patient is nervous/anxious.     As per HPI. Otherwise, a complete review of systems is negative.  PAST MEDICAL HISTORY: Past Medical History:  Diagnosis Date  . Arthritis   . Bronchitis   . Bruises easily   . Cancer (HCC)    breast  .  Cataracts, bilateral   . Chronic airway obstruction, not elsewhere classified    Symbicort daily and Albuterol daily as needed  . Chronic back pain    arthritis  . Chronic bronchitis ([place])   . Cramps of left lower extremity   . Diabetes mellitus without complication ([place])    borderline-diet and exercise;takes Cinnamon daily  . Esophageal reflux    takes Nexium daily and Dexilant daily as needed  . Fibromyalgia   . Fibromyalgia   . Headache    occasionally  . HOH (hard of hearing)   . Hyperlipidemia    takes CO Q10 daily  . Hypothyroidism   . Insomnia    takes Ambien and Trazodone nightly  . Joint pain   . Joint swelling   . Macular degeneration, right eye    wet and to get injection tomorrow  . Pneumonia    hx of x 3 with most recent one 2009  . Seasonal allergies    takes Zyrtec daily as well as using Nasonex  . Shortness of breath dyspnea   . Shoulder fracture   . Unspecified essential hypertension    takes Amlodipine and Losartan daily  . URI (upper respiratory infection)    was on Prednisone and Antibiotic-to complete today  . Urinary urgency     PAST SURGICAL HISTORY: Past Surgical History:  Procedure Laterality Date  . BREAST SURGERY  1972   nodules removed and benign  . CATARACT EXTRACTION W/PHACO Left 12/31/2014   Procedure: CATARACT EXTRACTION PHACO AND INTRAOCULAR LENS PLACEMENT (IOC);  Surgeon: Estill Cotta, MD;  Location: [facility];  Service: Ophthalmology;  Laterality: Left;  [place]  01:00 AP% 24.0 CDE 25.53 fluid pack # [id number]  . CATARACT EXTRACTION W/PHACO Right 04/27/2017   Procedure: CATARACT EXTRACTION PHACO AND INTRAOCULAR LENS PLACEMENT (IOC);  Surgeon: Birder Robson, MD;  Location: [facility];  Service: Ophthalmology;  Laterality: Right;  [place] 00:46.3 AP% 12.7 CDE 5.86 Fluid Pack lot # [id number]  . DEBRIDEMENT AND CLOSURE WOUND Left 09/10/2017   Procedure: CLOSURE OF LEFT BREAST MASTECTOMY/WOUND;  Surgeon: Wallace Going, DO;  Location:  [facility];  Service: Plastics;  Laterality: Left;  . JOINT REPLACEMENT     SHOULDER left  . MASTECTOMY W/ SENTINEL NODE BIOPSY Left 08/18/2017   Procedure: MASTECTOMY WITH SENTINEL LYMPH NODE BIOPSY;  Surgeon: [name], MD;  Location: [facility];  Service: General;  Laterality: Left;  . RE-EXCISION OF BREAST LUMPECTOMY Left 09/10/2017   Procedure: RE-EXCISION OF BREAST LUMPECTOMY;  Surgeon: [name], MD;  Location: [facility];  Service: General;  Laterality: Left;  . REVERSE SHOULDER ARTHROPLASTY Left 04/19/2014   Procedure:  REVERSE TOTAL SHOULDER ARTHROPLASTY;  Surgeon: Marin Shutter, MD;  Location: [place];  Service: Orthopedics;  Laterality: Left;    FAMILY HISTORY: Family History  Problem Relation Age of Onset  . Heart disease Unknown   . Breast cancer Neg Hx     ADVANCED DIRECTIVES (Y/N):  N  HEALTH MAINTENANCE: Social History   Tobacco Use  . Smoking status: Former Smoker    Packs/day: 2.00    Years: 30.00    Pack years: 60.00    Types: Cigarettes    Last attempt to quit: 02/17/2004    Years since quitting: 13.6  . Smokeless tobacco: Never Used  . Tobacco comment: quit smoking in 2006  Substance Use Topics  . Alcohol use: No    Alcohol/week: 0.0 standard drinks  . Drug use: No     Colonoscopy:  PAP:  Bone density:  Lipid panel:  Allergies  Allergen Reactions  . Protonix [Pantoprazole Sodium] Other (See Comments)    abd cramps  . Statins Other (See Comments)    Legs hurt    Current Outpatient Medications  Medication Sig Dispense Refill  . acetaminophen (TYLENOL) 500 MG tablet Take 500 mg by mouth every 6 (six) hours as needed for moderate pain or headache.     . albuterol (PROAIR HFA) 108 (90 BASE) MCG/ACT inhaler Inhale 2 puffs into the lungs every 4 (four) hours as needed. (Patient taking differently: Inhale 2 puffs into the lungs every 4 (four) hours as needed for wheezing or shortness of breath. ) 1 Inhaler 3  . albuterol (PROVENTIL) (2.5  MG/3ML) 0.083% nebulizer solution Take 2.5 mg by nebulization 2 (two) times daily as needed for wheezing or shortness of breath.     Marland Kitchen amLODipine (NORVASC) 5 MG tablet Take 5 mg by mouth daily.    . Bevacizumab (AVASTIN IV) Place 1 Dose into both eyes every 30 (thirty) days.     . cetirizine (ZYRTEC) 10 MG tablet Take 10 mg by mouth at bedtime.     . cholecalciferol (VITAMIN D) 1000 UNITS tablet Take 1,000 Units by mouth daily.    . clotrimazole (LOTRIMIN) 1 % cream Apply 1 application topically 2 (two) times daily as needed (for yeast infection).    . Coenzyme Q10 (CO Q 10) 10 MG CAPS Take 10 mg by mouth daily.     Marland Kitchen dextromethorphan-guaiFENesin (MUCINEX DM) 30-600 MG per 12 hr tablet Take 1 tablet by mouth every 12 (twelve) hours as needed for cough.     Marland Kitchen  diazepam (VALIUM) 5 MG tablet Take 0.5-1 tablets (2.5-5 mg total) by mouth every 6 (six) hours as needed for muscle spasms or sedation. (Patient taking differently: Take 5 mg by mouth at bedtime. ) 40 tablet 1  . diclofenac (VOLTAREN) 75 MG EC tablet Take 75 mg by mouth 2 (two) times daily.      Marland Kitchen esomeprazole (NEXIUM) 40 MG capsule Take 40 mg by mouth daily before breakfast.     . famotidine (PEPCID) 20 MG tablet Take 1 tablet (20 mg total) by mouth 2 (two) times daily. When coughing (Patient taking differently: Take 20 mg by mouth 2 (two) times daily as needed (for coughing). ) 60 tablet 6  . Homeopathic Products ([employer] LEG CRAMPS PAIN RELIEF PO) Take 2 tablets by mouth every 4 (four) hours as needed (for leg cramps).     Marland Kitchen HYDROcodone-acetaminophen (NORCO/VICODIN) 5-325 MG tablet Take 1 tablet by mouth every 4 (four) hours as needed for moderate pain. 15 tablet 0  . levothyroxine (SYNTHROID, LEVOTHROID) 25 MCG tablet Take 25 mcg by mouth daily before breakfast.    . losartan (COZAAR) 100 MG tablet Take 100 mg by mouth daily.     . Magnesium 400 MG CAPS Take 400 mg by mouth See admin instructions. Take 400 mg by mouth in the morning every day and  take 400 mg by mouth 3 times weekly at bedtime    . metoprolol succinate (TOPROL-XL) 25 MG 24 hr tablet Take 25 mg by mouth daily.    . mometasone (NASONEX) 50 MCG/ACT nasal spray Place 1 spray into the nose daily. 17 g 11  . Multiple Vitamin (MULTIVITAMIN WITH MINERALS) TABS tablet Take 1 tablet by mouth daily.    Marland Kitchen nystatin (MYCOSTATIN) 100000 UNIT/ML suspension Take 5 mLs (500,000 Units total) by mouth as needed. (Patient taking differently: Take 5 mLs by mouth daily as needed (for mouth due to inhaler). ) 60 mL 5  . Polyethyl Glycol-Propyl Glycol (SYSTANE OP) Place 1 drop into both eyes 3 (three) times daily as needed (for dry eyes (at least once daily)).     . SYMBICORT 160-4.5 MCG/ACT inhaler Inhale 2 puffs into the lungs 2 (two) times daily. 30.6 Inhaler 2  . vitamin C (ASCORBIC ACID) 500 MG tablet Take 500 mg by mouth daily.     No current facility-administered medications for this visit.     OBJECTIVE: There were no vitals filed for this visit.   There is no height or weight on file to calculate BMI.    ECOG FS:0 - Asymptomatic  General: Well-developed, well-nourished, no acute distress. Eyes: Pink conjunctiva, anicteric sclera. HEENT: Normocephalic, moist mucous membranes. Breast: Well-healing left mastectomy scar.  Exam deferred today. Lungs: Clear to auscultation bilaterally. Heart: Regular rate and rhythm. No rubs, murmurs, or gallops. Abdomen: Soft, nontender, nondistended. No organomegaly noted, normoactive bowel sounds. Musculoskeletal: No edema, cyanosis, or clubbing. Neuro: Alert, answering all questions appropriately. Cranial nerves grossly intact. Skin: No rashes or petechiae noted. Psych: Normal affect.  LAB RESULTS:  Lab Results  Component Value Date   NA 140 04/10/2014   K 4.1 04/10/2014   CL 104 04/10/2014   CO2 25 04/10/2014   GLUCOSE 107 (H) 04/10/2014   BUN 17 04/10/2014   CREATININE 0.73 08/18/2017   CALCIUM 9.0 04/10/2014   GFRNONAA >60 08/18/2017     GFRAA >60 08/18/2017    Lab Results  Component Value Date   WBC 12.3 (H) 09/03/2017   HGB 12.8 09/03/2017   HCT  38.0 09/03/2017   MCV 86.8 09/03/2017   PLT 426 09/03/2017     STUDIES: No results found.  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  Oncotype DX score 16 which is low risk. PLAN:   1.  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  She was noted to have micrometastasis in her sentinel lymph node.  Although the benefit of adjuvant XRT is unclear, patient met with radiation oncology and likely will agree to proceeding with treatment.  All of her questions were answered to her satisfaction.  Follow-up with radiation oncology as scheduled.  Return to clinic in 3 months for further evaluation and initiation of an aromatase inhibitor.    I spent a total of 30 minutes face-to-face with the patient of which greater than 50% of the visit was spent in counseling and coordination of care as detailed above.   Patient expressed understanding and was in agreement with this plan. She also understands that She can call clinic at any time with any questions, concerns, or complaints.   Cancer Staging Primary cancer of lower outer quadrant of left female breast [facility]) Staging form: Breast, AJCC 8th Edition - Clinical stage from 07/27/2017: Stage IIIB (cT4b, cN43m, cM0, G1, ER+, PR+, HER2-) - Signed by FLloyd Huger MD on 08/31/2017   TLloyd Huger MD   10/08/2017 7:10 PM

## 2017-10-06 ENCOUNTER — Other Ambulatory Visit: Payer: Self-pay

## 2017-10-06 ENCOUNTER — Inpatient Hospital Stay (HOSPITAL_BASED_OUTPATIENT_CLINIC_OR_DEPARTMENT_OTHER): Payer: PPO | Admitting: Oncology

## 2017-10-06 ENCOUNTER — Ambulatory Visit
Admission: RE | Admit: 2017-10-06 | Discharge: 2017-10-06 | Disposition: A | Discharge status: Home / Self Care | Payer: PPO | Source: Ambulatory Visit | Attending: Radiation Oncology | Admitting: Radiation Oncology

## 2017-10-06 ENCOUNTER — Encounter: Payer: Self-pay | Admitting: Radiation Oncology

## 2017-10-06 DIAGNOSIS — M129 Arthropathy, unspecified: Secondary | ICD-10-CM | POA: Insufficient documentation

## 2017-10-06 DIAGNOSIS — Z9012 Acquired absence of left breast and nipple: Secondary | ICD-10-CM | POA: Diagnosis not present

## 2017-10-06 DIAGNOSIS — I10 Essential (primary) hypertension: Secondary | ICD-10-CM | POA: Diagnosis not present

## 2017-10-06 DIAGNOSIS — G4700 Insomnia, unspecified: Secondary | ICD-10-CM | POA: Insufficient documentation

## 2017-10-06 DIAGNOSIS — Z79899 Other long term (current) drug therapy: Secondary | ICD-10-CM | POA: Insufficient documentation

## 2017-10-06 DIAGNOSIS — E119 Type 2 diabetes mellitus without complications: Secondary | ICD-10-CM | POA: Insufficient documentation

## 2017-10-06 DIAGNOSIS — E785 Hyperlipidemia, unspecified: Secondary | ICD-10-CM | POA: Diagnosis not present

## 2017-10-06 DIAGNOSIS — M2550 Pain in unspecified joint: Secondary | ICD-10-CM | POA: Insufficient documentation

## 2017-10-06 DIAGNOSIS — E039 Hypothyroidism, unspecified: Secondary | ICD-10-CM | POA: Diagnosis not present

## 2017-10-06 DIAGNOSIS — C50512 Malignant neoplasm of lower-outer quadrant of left female breast: Secondary | ICD-10-CM | POA: Insufficient documentation

## 2017-10-06 DIAGNOSIS — M549 Dorsalgia, unspecified: Secondary | ICD-10-CM | POA: Diagnosis not present

## 2017-10-06 DIAGNOSIS — Z170 Estrogen receptor positive status [ER+]: Secondary | ICD-10-CM | POA: Diagnosis not present

## 2017-10-06 DIAGNOSIS — J449 Chronic obstructive pulmonary disease, unspecified: Secondary | ICD-10-CM | POA: Diagnosis not present

## 2017-10-06 DIAGNOSIS — Z87891 Personal history of nicotine dependence: Secondary | ICD-10-CM | POA: Insufficient documentation

## 2017-10-06 DIAGNOSIS — K219 Gastro-esophageal reflux disease without esophagitis: Secondary | ICD-10-CM | POA: Insufficient documentation

## 2017-10-06 DIAGNOSIS — M797 Fibromyalgia: Secondary | ICD-10-CM | POA: Diagnosis not present

## 2017-10-06 NOTE — Consult Note (Signed)
NEW PATIENT EVALUATION  Name: Kristin Estrada  MRN: [id number]  Date:   10/06/2017     DOB: [birth year]   This 77 y.o. 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  REFERRING PHYSICIAN: Frazier Richards, MD  CHIEF COMPLAINT:  Chief Complaint  Patient presents with  . Breast Cancer    initial eval    DIAGNOSIS: The encounter diagnosis was Primary cancer of lower outer quadrant of left female breast ([place]).   PREVIOUS INVESTIGATIONS:  Mammogram and ultrasound reviewed Pathology reports reviewed Clinical notes reviewed  HPI: patient is a 77 year old female who noticed a skin lesion in her left breast and lower outer quadrant.his prompted ultrasound as well as mammography showing a highly suspicious lesion at the 5:00 position measuring 3.3 cm in greatest dimension by ultrasound and 4 cm by mammography. There was an area of 2.5 cm ulceration of the skin of the 6:00 position with a firm fixed mass and retraction of the left nipple. No axillary adenopathy was appreciated. Her case was presented at tumor conference and it was decided to forego neoadjuvant chemotherapy and proceed with left modified radical mastectomy.she underwent this procedure on 08/18/2017 showing 3.5 cm invasive mammary carcinoma overall grade 1. Ductal carcinoma in situ was present. Tumor directly invaded the skin in the dermis and epidermis with skin ulceration. Skeletal muscle was free of invasion.inferior skin margin was positive.she had one lymph node involved with aa 0.3 mm the positive metastatic disease. Tumor was strongly ER/PR positive HER-2/neu negative. She underwent reexcision on July 26 with clear margin obtained.she underwent Oncotype DX showing a 6 recurrence  risk score of 16 and is not thought to be a candidate for systemic chemotherapy. She will be on anti-estrogen therapy after completion of radiation.she is seen today and is doing well she still has granulation tissue present in the medial portion of her scar. She's having no lymphedema of her left upper extremity. She specifically denies bone pain or cough.  PLANNED TREATMENT REGIMEN: left chest wall peripheral lymphatic radiation  PAST MEDICAL HISTORY:  has a past medical history of Arthritis, Bronchitis, Bruises easily, Cancer ([place]), Cataracts, bilateral, Chronic airway obstruction, not elsewhere classified, Chronic back pain, Chronic bronchitis (HCC), Cramps of left lower extremity, Diabetes mellitus without complication (HCC), Esophageal reflux, Fibromyalgia, Fibromyalgia, Headache, HOH (hard of hearing), Hyperlipidemia, Hypothyroidism, Insomnia, Joint pain, Joint swelling, Macular degeneration, right eye, Pneumonia, Seasonal allergies, Shortness of breath dyspnea, Shoulder fracture, Unspecified essential hypertension, URI (upper respiratory infection), and Urinary urgency.    PAST SURGICAL HISTORY:  Past Surgical History:  Procedure Laterality Date  . BREAST SURGERY  1972   nodules removed and benign  . CATARACT EXTRACTION W/PHACO Left 12/31/2014   Procedure: CATARACT EXTRACTION PHACO AND INTRAOCULAR LENS PLACEMENT (IOC);  Surgeon: Estill Cotta, MD;  Location: [facility];  Service: Ophthalmology;  Laterality: Left;  [place] 01:00 AP% 24.0 CDE 25.53 fluid pack # [id number]  . CATARACT EXTRACTION W/PHACO Right 04/27/2017   Procedure: CATARACT EXTRACTION PHACO AND INTRAOCULAR LENS PLACEMENT (IOC);  Surgeon: Birder Robson, MD;  Location: [facility];  Service: Ophthalmology;  Laterality: Right;  [place] 00:46.3 AP% 12.7 CDE 5.86 Fluid Pack lot # [id number]  . DEBRIDEMENT AND CLOSURE WOUND Left 09/10/2017   Procedure: CLOSURE OF LEFT BREAST MASTECTOMY/WOUND;  Surgeon: Marla Roe,  [name], DO;  Location:  [facility];  Service: Plastics;  Laterality: Left;  . JOINT REPLACEMENT     SHOULDER left  . MASTECTOMY W/ SENTINEL NODE BIOPSY Left 08/18/2017   Procedure: MASTECTOMY WITH SENTINEL LYMPH NODE BIOPSY;  Surgeon: [name], MD;  Location: [facility];  Service: General;  Laterality: Left;  . RE-EXCISION OF BREAST LUMPECTOMY Left 09/10/2017   Procedure: RE-EXCISION OF BREAST LUMPECTOMY;  Surgeon: [name], MD;  Location: [facility];  Service: General;  Laterality: Left;  . REVERSE SHOULDER ARTHROPLASTY Left 04/19/2014   Procedure:  REVERSE TOTAL SHOULDER ARTHROPLASTY;  Surgeon: Marin Shutter, MD;  Location: [place];  Service: Orthopedics;  Laterality: Left;    FAMILY HISTORY: family history includes Heart disease in her unknown relative.  SOCIAL HISTORY:  reports that she quit smoking about 13 years ago. Her smoking use included cigarettes. She has a 60.00 pack-year smoking history. She has never used smokeless tobacco. She reports that she does not drink alcohol or use drugs.  ALLERGIES: Protonix [pantoprazole sodium] and Statins  MEDICATIONS:  Current Outpatient Medications  Medication Sig Dispense Refill  . acetaminophen (TYLENOL) 500 MG tablet Take 500 mg by mouth every 6 (six) hours as needed for moderate pain or headache.     . albuterol (PROAIR HFA) 108 (90 BASE) MCG/ACT inhaler Inhale 2 puffs into the lungs every 4 (four) hours as needed. (Patient taking differently: Inhale 2 puffs into the lungs every 4 (four) hours as needed for wheezing or shortness of breath. ) 1 Inhaler 3  . albuterol (PROVENTIL) (2.5 MG/3ML) 0.083% nebulizer solution Take 2.5 mg by nebulization 2 (two) times daily as needed for wheezing or shortness of breath.     Marland Kitchen amLODipine (NORVASC) 5 MG tablet Take 5 mg by mouth daily.    . Bevacizumab (AVASTIN IV) Place 1 Dose into both eyes every 30 (thirty) days.     . cetirizine (ZYRTEC) 10 MG tablet Take 10 mg by mouth at bedtime.     . cholecalciferol  (VITAMIN D) 1000 UNITS tablet Take 1,000 Units by mouth daily.    . clotrimazole (LOTRIMIN) 1 % cream Apply 1 application topically 2 (two) times daily as needed (for yeast infection).    . Coenzyme Q10 (CO Q 10) 10 MG CAPS Take 10 mg by mouth daily.     Marland Kitchen dextromethorphan-guaiFENesin (MUCINEX DM) 30-600 MG per 12 hr tablet Take 1 tablet by mouth every 12 (twelve) hours as needed for cough.     . diazepam (VALIUM) 5 MG tablet Take 0.5-1 tablets (2.5-5 mg total) by mouth every 6 (six) hours as needed for muscle spasms or sedation. (Patient taking differently: Take 5 mg by mouth at bedtime. ) 40 tablet 1  . diclofenac (VOLTAREN) 75 MG EC tablet Take 75 mg by mouth 2 (two) times daily.      Marland Kitchen esomeprazole (NEXIUM) 40 MG capsule Take 40 mg by mouth daily before breakfast.     . famotidine (PEPCID) 20 MG tablet Take 1 tablet (20 mg total) by mouth 2 (two) times daily. When coughing (Patient taking differently: Take 20 mg by mouth 2 (two) times daily as needed (for coughing). ) 60 tablet 6  . Homeopathic Products ([employer] LEG CRAMPS PAIN RELIEF PO) Take 2 tablets by mouth every 4 (four) hours as needed (for leg cramps).     Marland Kitchen HYDROcodone-acetaminophen (NORCO/VICODIN) 5-325 MG tablet Take 1 tablet by mouth every 4 (four) hours as needed for moderate pain. 15 tablet 0  .  levothyroxine (SYNTHROID, LEVOTHROID) 25 MCG tablet Take 25 mcg by mouth daily before breakfast.    . losartan (COZAAR) 100 MG tablet Take 100 mg by mouth daily.     . Magnesium 400 MG CAPS Take 400 mg by mouth See admin instructions. Take 400 mg by mouth in the morning every day and take 400 mg by mouth 3 times weekly at bedtime    . metoprolol succinate (TOPROL-XL) 25 MG 24 hr tablet Take 25 mg by mouth daily.    . mometasone (NASONEX) 50 MCG/ACT nasal spray Place 1 spray into the nose daily. 17 g 11  . Multiple Vitamin (MULTIVITAMIN WITH MINERALS) TABS tablet Take 1 tablet by mouth daily.    Marland Kitchen nystatin (MYCOSTATIN) 100000 UNIT/ML suspension  Take 5 mLs (500,000 Units total) by mouth as needed. (Patient taking differently: Take 5 mLs by mouth daily as needed (for mouth due to inhaler). ) 60 mL 5  . Polyethyl Glycol-Propyl Glycol (SYSTANE OP) Place 1 drop into both eyes 3 (three) times daily as needed (for dry eyes (at least once daily)).     . SYMBICORT 160-4.5 MCG/ACT inhaler Inhale 2 puffs into the lungs 2 (two) times daily. 30.6 Inhaler 2  . vitamin C (ASCORBIC ACID) 500 MG tablet Take 500 mg by mouth daily.     No current facility-administered medications for this encounter.     ECOG PERFORMANCE STATUS:  0 - Asymptomatic  REVIEW OF SYSTEMS:  Patient denies any weight loss, fatigue, weakness, fever, chills or night sweats. Patient denies any loss of vision, blurred vision. Patient denies any ringing  of the ears or hearing loss. No irregular heartbeat. Patient denies heart murmur or history of fainting. Patient denies any chest pain or pain radiating to her upper extremities. Patient denies any shortness of breath, difficulty breathing at night, cough or hemoptysis. Patient denies any swelling in the lower legs. Patient denies any nausea vomiting, vomiting of blood, or coffee ground material in the vomitus. Patient denies any stomach pain. Patient states has had normal bowel movements no significant constipation or diarrhea. Patient denies any dysuria, hematuria or significant nocturia. Patient denies any problems walking, swelling in the joints or loss of balance. Patient denies any skin changes, loss of hair or loss of weight. Patient denies any excessive worrying or anxiety or significant depression. Patient denies any problems with insomnia. Patient denies excessive thirst, polyuria, polydipsia. Patient denies any swollen glands, patient denies easy bruising or easy bleeding. Patient denies any recent infections, allergies or URI. Patient "s visual fields have not changed significantly in recent time.    PHYSICAL EXAM: BP (!) (P)  182/77   Pulse (P) 72   Temp (!) (P) 95.6 F (35.3 C) (Tympanic)   Wt (P) 176 lb 5.9 oz (80 kg)   BMI (P) 32.26 kg/m  Patient is status post left modified radical mastectomy. There is granulation tissue still present in the central portion of her mastectomy scar. No evidence of chest wall mass or nodularity is noted. Right breast is free of dominant mass or nodularity in 2 positions examined. No axillary or supraclavicular adenopathy is identified.Well-developed well-nourished patient in NAD. HEENT reveals PERLA, EOMI, discs not visualized.  Oral cavity is clear. No oral mucosal lesions are identified. Neck is clear without evidence of cervical or supraclavicular adenopathy. Lungs are clear to A&P. Cardiac examination is essentially unremarkable with regular rate and rhythm without murmur rub or thrill. Abdomen is benign with no organomegaly or masses noted. Motor sensory  and DTR levels are equal and symmetric in the upper and lower extremities. Cranial nerves II through XII are grossly intact. Proprioception is intact. No peripheral adenopathy or edema is identified. No motor or sensory levels are noted. Crude visual fields are within normal range.  LABORATORY DATA: pathology reports reviewed    RADIOLOGY RESULTS:mammogram and ultrasound reviewed   IMPRESSION: stage IIIB invasive mammary carcinoma the left breast with skin ulceration status post left modified radical mastectomy in 77 year old female  PLAN: t this time based on her poor prognostic factors including large size of her tumor 3.5 cm skin involvement positive sentinel lymph node without full axillary lymph node dissection and initial positive marginwould recommend adjuvant radiation therapy to her left chest wall and peripheral lymphatics. I will plan on delivering 5040 cGy in 28 fractions boosting her scar another 1400 cGy using electron beam. Risks and benefits of treatment including possible skin reaction fatigue alteration of blood  counts possible inclusion of superficial lung and possibility of lymphedema of her left upper extremity all were explained in detail to the patient. She seems to comprehend my treatment plan well. I first set up and arranged for CT simulation in about 2 weeks to allow further healing of her scar.Patient also will be candidate for antiestrogen therapy after completion of radiation. Patient seems to comprehend my treatment plan well.  I would like to take this opportunity to thank you for allowing me to participate in the care of your patient.Noreene Filbert, MD

## 2017-10-07 DIAGNOSIS — H353221 Exudative age-related macular degeneration, left eye, with active choroidal neovascularization: Secondary | ICD-10-CM | POA: Diagnosis not present

## 2017-10-21 ENCOUNTER — Ambulatory Visit: Payer: PPO

## 2017-10-21 ENCOUNTER — Ambulatory Visit: Admission: RE | Admit: 2017-10-21 | Payer: PPO | Source: Ambulatory Visit

## 2017-11-10 DIAGNOSIS — C50512 Malignant neoplasm of lower-outer quadrant of left female breast: Secondary | ICD-10-CM | POA: Diagnosis not present

## 2017-11-10 DIAGNOSIS — H353212 Exudative age-related macular degeneration, right eye, with inactive choroidal neovascularization: Secondary | ICD-10-CM | POA: Diagnosis not present

## 2017-11-11 DIAGNOSIS — H353221 Exudative age-related macular degeneration, left eye, with active choroidal neovascularization: Secondary | ICD-10-CM | POA: Diagnosis not present

## 2017-11-28 NOTE — Progress Notes (Signed)
[place]  [phone] Fax:([phone]) [phone]  ID: NIMSI MALES OB: [birth year]  MR#: [id number]  ZJQ#:[id number]  Patient Care Team: Frazier Richards, MD as PCP - General (Family Medicine) [name], MD (Pulmonary Disease) [name], MD as Consulting Physician (General Surgery)  CHIEF COMPLAINT: Clinical stage IIIb ER/PR positive, HER-2 negative invasive carcinoma of the lower outer quadrant of the left breast.  Oncotype DX score 16, low risk.  INTERVAL HISTORY: Patient returns to clinic today as an add-on for further evaluation and treatment planning.  She met with radiation oncology several months ago and ultimately declined adjuvant XRT.  She continues to be anxious, but otherwise feels well.  She has no neurologic complaints.  She denies any recent fevers or illnesses.  She has a good appetite and denies weight loss.  She denies any pain.  She has no chest pain or shortness of breath.  She denies any nausea, vomiting, constipation, or diarrhea.  She has no urinary complaints.  Patient feels at her baseline offers no further specific complaints today.  REVIEW OF SYSTEMS:   Review of Systems  Constitutional: Negative.  Negative for fever, malaise/fatigue and weight loss.  Respiratory: Negative.  Negative for cough and shortness of breath.   Cardiovascular: Negative.  Negative for chest pain and leg swelling.  Gastrointestinal: Negative.  Negative for abdominal pain.  Genitourinary: Negative.  Negative for dysuria.  Musculoskeletal: Negative.  Negative for back pain.  Skin: Negative.  Negative for rash.  Neurological: Negative.  Negative for sensory change, focal weakness and weakness.  Psychiatric/Behavioral: The patient is nervous/anxious.     As per HPI. Otherwise, a complete review of systems is negative.  PAST MEDICAL HISTORY: Past Medical History:  Diagnosis Date  . Arthritis   . Bronchitis   . Bruises easily   . Cancer  ([facility])    breast  . Cataracts, bilateral   . Chronic airway obstruction, not elsewhere classified    Symbicort daily and Albuterol daily as needed  . Chronic back pain    arthritis  . Chronic bronchitis ([place])   . Cramps of left lower extremity   . Diabetes mellitus without complication ([place])    borderline-diet and exercise;takes Cinnamon daily  . Esophageal reflux    takes Nexium daily and Dexilant daily as needed  . Fibromyalgia   . Fibromyalgia   . Headache    occasionally  . HOH (hard of hearing)   . Hyperlipidemia    takes CO Q10 daily  . Hypothyroidism   . Insomnia    takes Ambien and Trazodone nightly  . Joint pain   . Joint swelling   . Macular degeneration, right eye    wet and to get injection tomorrow  . Pneumonia    hx of x 3 with most recent one 2009  . Seasonal allergies    takes Zyrtec daily as well as using Nasonex  . Shortness of breath dyspnea   . Shoulder fracture   . Unspecified essential hypertension    takes Amlodipine and Losartan daily  . URI (upper respiratory infection)    was on Prednisone and Antibiotic-to complete today  . Urinary urgency     PAST SURGICAL HISTORY: Past Surgical History:  Procedure Laterality Date  . BREAST SURGERY  1972   nodules removed and benign  . CATARACT EXTRACTION W/PHACO Left 12/31/2014   Procedure: CATARACT EXTRACTION PHACO AND INTRAOCULAR LENS PLACEMENT (IOC);  Surgeon: Estill Cotta, MD;  Location: [facility];  Service:  Ophthalmology;  Laterality: Left;  [place] 01:00 AP% 24.0 CDE 25.53 fluid pack # [id number]  . CATARACT EXTRACTION W/PHACO Right 04/27/2017   Procedure: CATARACT EXTRACTION PHACO AND INTRAOCULAR LENS PLACEMENT (IOC);  Surgeon: Birder Robson, MD;  Location: [facility];  Service: Ophthalmology;  Laterality: Right;  [place] 00:46.3 AP% 12.7 CDE 5.86 Fluid Pack lot # [id number]  . DEBRIDEMENT AND CLOSURE WOUND Left 09/10/2017   Procedure: CLOSURE OF LEFT BREAST MASTECTOMY/WOUND;  Surgeon: Wallace Going, DO;  Location: [facility];  Service: Plastics;  Laterality: Left;  . JOINT REPLACEMENT     SHOULDER left  . MASTECTOMY W/ SENTINEL NODE BIOPSY Left 08/18/2017   Procedure: MASTECTOMY WITH SENTINEL LYMPH NODE BIOPSY;  Surgeon: [name], MD;  Location: [facility];  Service: General;  Laterality: Left;  . RE-EXCISION OF BREAST LUMPECTOMY Left 09/10/2017   Procedure: RE-EXCISION OF BREAST LUMPECTOMY;  Surgeon: [name], MD;  Location: [facility];  Service: General;  Laterality: Left;  . REVERSE SHOULDER ARTHROPLASTY Left 04/19/2014   Procedure:  REVERSE TOTAL SHOULDER ARTHROPLASTY;  Surgeon: Marin Shutter, MD;  Location: [place];  Service: Orthopedics;  Laterality: Left;    FAMILY HISTORY: Family History  Problem Relation Age of Onset  . Heart disease Unknown   . Breast cancer Neg Hx     ADVANCED DIRECTIVES (Y/N):  N  HEALTH MAINTENANCE: Social History   Tobacco Use  . Smoking status: Former Smoker    Packs/day: 2.00    Years: 30.00    Pack years: 60.00    Types: Cigarettes    Last attempt to quit: 02/17/2004    Years since quitting: 13.7  . Smokeless tobacco: Never Used  . Tobacco comment: quit smoking in 2006  Substance Use Topics  . Alcohol use: No    Alcohol/week: 0.0 standard drinks  . Drug use: No     Colonoscopy:  PAP:  Bone density:  Lipid panel:  Allergies  Allergen Reactions  . Protonix [Pantoprazole Sodium] Other (See Comments)    abd cramps  . Statins Other (See Comments)    Legs hurt    Current Outpatient Medications  Medication Sig Dispense Refill  . acetaminophen (TYLENOL) 500 MG tablet Take 500 mg by mouth every 6 (six) hours as needed for moderate pain or headache.     . albuterol (PROAIR HFA) 108 (90 BASE) MCG/ACT inhaler Inhale 2 puffs into the lungs every 4 (four) hours as needed. (Patient taking differently: Inhale 2 puffs into the lungs every 4 (four) hours as needed for wheezing or shortness of breath. ) 1 Inhaler 3  .  albuterol (PROVENTIL) (2.5 MG/3ML) 0.083% nebulizer solution Take 2.5 mg by nebulization 2 (two) times daily as needed for wheezing or shortness of breath.     Marland Kitchen amLODipine (NORVASC) 5 MG tablet Take 5 mg by mouth daily.    . Bevacizumab (AVASTIN IV) Place 1 Dose into both eyes every 30 (thirty) days.     . cetirizine (ZYRTEC) 10 MG tablet Take 10 mg by mouth at bedtime.     . cholecalciferol (VITAMIN D) 1000 UNITS tablet Take 1,000 Units by mouth daily.    . clotrimazole (LOTRIMIN) 1 % cream Apply 1 application topically 2 (two) times daily as needed (for yeast infection).    . Coenzyme Q10 (CO Q 10) 10 MG CAPS Take 10 mg by mouth daily.     Marland Kitchen dextromethorphan-guaiFENesin (MUCINEX DM) 30-600 MG per 12 hr tablet Take 1 tablet by mouth every 12 (twelve) hours  as needed for cough.     . diazepam (VALIUM) 5 MG tablet Take 0.5-1 tablets (2.5-5 mg total) by mouth every 6 (six) hours as needed for muscle spasms or sedation. (Patient taking differently: Take 5 mg by mouth at bedtime. ) 40 tablet 1  . diclofenac (VOLTAREN) 75 MG EC tablet Take 75 mg by mouth 2 (two) times daily.      Marland Kitchen esomeprazole (NEXIUM) 40 MG capsule Take 40 mg by mouth daily before breakfast.     . famotidine (PEPCID) 20 MG tablet Take 1 tablet (20 mg total) by mouth 2 (two) times daily. When coughing (Patient taking differently: Take 20 mg by mouth 2 (two) times daily as needed (for coughing). ) 60 tablet 6  . Homeopathic Products ([employer] LEG CRAMPS PAIN RELIEF PO) Take 2 tablets by mouth every 4 (four) hours as needed (for leg cramps).     Marland Kitchen levothyroxine (SYNTHROID, LEVOTHROID) 25 MCG tablet Take 25 mcg by mouth daily before breakfast.    . losartan (COZAAR) 100 MG tablet Take 100 mg by mouth daily.     . Magnesium 400 MG CAPS Take 400 mg by mouth See admin instructions. Take 400 mg by mouth in the morning every day and take 400 mg by mouth 3 times weekly at bedtime    . metoprolol succinate (TOPROL-XL) 25 MG 24 hr tablet Take 25 mg by  mouth daily.    . mometasone (NASONEX) 50 MCG/ACT nasal spray Place 1 spray into the nose daily. 17 g 11  . Multiple Vitamin (MULTIVITAMIN WITH MINERALS) TABS tablet Take 1 tablet by mouth daily.    Marland Kitchen nystatin (MYCOSTATIN) 100000 UNIT/ML suspension Take 5 mLs (500,000 Units total) by mouth as needed. (Patient taking differently: Take 5 mLs by mouth daily as needed (for mouth due to inhaler). ) 60 mL 5  . Polyethyl Glycol-Propyl Glycol (SYSTANE OP) Place 1 drop into both eyes 3 (three) times daily as needed (for dry eyes (at least once daily)).     . SYMBICORT 160-4.5 MCG/ACT inhaler Inhale 2 puffs into the lungs 2 (two) times daily. 30.6 Inhaler 2  . vitamin C (ASCORBIC ACID) 500 MG tablet Take 500 mg by mouth daily.    Marland Kitchen HYDROcodone-acetaminophen (NORCO/VICODIN) 5-325 MG tablet Take 1 tablet by mouth every 4 (four) hours as needed for moderate pain. (Patient not taking: Reported on 11/29/2017) 15 tablet 0  . letrozole (FEMARA) 2.5 MG tablet Take 1 tablet (2.5 mg total) by mouth daily. 30 tablet 3   No current facility-administered medications for this visit.     OBJECTIVE: Vitals:   11/29/17 1004  BP: (!) 183/83  Pulse: 74  Resp: 18  Temp: 98.2 F (36.8 C)     Body mass index is 33.03 kg/m.    ECOG FS:0 - Asymptomatic  General: Well-developed, well-nourished, no acute distress. Eyes: Pink conjunctiva, anicteric sclera. HEENT: Normocephalic, moist mucous membranes. Breast: Left mastectomy.  Exam deferred today. Lungs: Clear to auscultation bilaterally. Heart: Regular rate and rhythm. No rubs, murmurs, or gallops. Abdomen: Soft, nontender, nondistended. No organomegaly noted, normoactive bowel sounds. Musculoskeletal: No edema, cyanosis, or clubbing. Neuro: Alert, answering all questions appropriately. Cranial nerves grossly intact. Skin: No rashes or petechiae noted. Psych: Normal affect.  LAB RESULTS:  Lab Results  Component Value Date   NA 140 04/10/2014   K 4.1 04/10/2014     CL 104 04/10/2014   CO2 25 04/10/2014   GLUCOSE 107 (H) 04/10/2014   BUN 17 04/10/2014  CREATININE 0.73 08/18/2017   CALCIUM 9.0 04/10/2014   GFRNONAA >60 08/18/2017   GFRAA >60 08/18/2017    Lab Results  Component Value Date   WBC 12.3 (H) 09/03/2017   HGB 12.8 09/03/2017   HCT 38.0 09/03/2017   MCV 86.8 09/03/2017   PLT 426 09/03/2017     STUDIES: No results found.  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  Oncotype DX score 16 which is low risk. PLAN:   1.  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  She was noted to have micrometastasis in her sentinel lymph node.  Although the benefit of adjuvant XRT is unclear, patient met with radiation oncology.  She ultimately declined adjuvant XRT as well.  She has agreed to take letrozole for a total of 5 years completing in October 2024.  Return to clinic in 3 months for routine evaluation. 2.  Osteopenia: Patient had a bone mineral density in July 2017 revealing a T score of -2.3.  She has been instructed to continue Fosamax as well as calcium and vitamin D supplementation.  Will repeat bone marrow density in the next 1 to 2 weeks.   I spent a total of 30 minutes face-to-face with the patient of which greater than 50% of the visit was spent in counseling and coordination of care as detailed above.   Patient expressed understanding and was in agreement with this plan. She also understands that She can call clinic at any time with any questions, concerns, or complaints.   Cancer Staging Primary cancer of lower outer quadrant of left female breast [facility]) Staging form: Breast, AJCC 8th Edition - Clinical stage from 07/27/2017: Stage IIIB (cT4b, cN72m, cM0, G1, ER+, PR+, HER2-) -  Signed by FLloyd Huger MD on 08/31/2017   TLloyd Huger MD   11/29/2017 12:25 PM

## 2017-11-29 ENCOUNTER — Inpatient Hospital Stay: Payer: PPO | Attending: Oncology | Admitting: Oncology

## 2017-11-29 ENCOUNTER — Encounter: Payer: Self-pay | Admitting: Oncology

## 2017-11-29 VITALS — BP 183/83 | HR 74 | Temp 98.2°F | Resp 18 | Wt 180.6 lb

## 2017-11-29 DIAGNOSIS — C50512 Malignant neoplasm of lower-outer quadrant of left female breast: Secondary | ICD-10-CM | POA: Insufficient documentation

## 2017-11-29 DIAGNOSIS — I10 Essential (primary) hypertension: Secondary | ICD-10-CM | POA: Diagnosis not present

## 2017-11-29 DIAGNOSIS — M8580 Other specified disorders of bone density and structure, unspecified site: Secondary | ICD-10-CM | POA: Diagnosis not present

## 2017-11-29 DIAGNOSIS — E785 Hyperlipidemia, unspecified: Secondary | ICD-10-CM | POA: Diagnosis not present

## 2017-11-29 DIAGNOSIS — E119 Type 2 diabetes mellitus without complications: Secondary | ICD-10-CM | POA: Diagnosis not present

## 2017-11-29 DIAGNOSIS — Z170 Estrogen receptor positive status [ER+]: Secondary | ICD-10-CM | POA: Diagnosis not present

## 2017-11-29 MED ORDER — LETROZOLE 2.5 MG PO TABS: 2.5000 mg | ORAL_TABLET | Freq: Every day | ORAL | 3 refills | Status: DC

## 2017-11-29 NOTE — Progress Notes (Signed)
Pt in to discuss treatment options.

## 2017-12-07 ENCOUNTER — Ambulatory Visit
Admission: RE | Admit: 2017-12-07 | Discharge: 2017-12-07 | Disposition: A | Discharge status: Home / Self Care | Payer: PPO | Source: Ambulatory Visit | Attending: Oncology | Admitting: Oncology

## 2017-12-07 DIAGNOSIS — Z79811 Long term (current) use of aromatase inhibitors: Secondary | ICD-10-CM | POA: Diagnosis not present

## 2017-12-07 DIAGNOSIS — C50512 Malignant neoplasm of lower-outer quadrant of left female breast: Secondary | ICD-10-CM

## 2017-12-07 DIAGNOSIS — Z13820 Encounter for screening for osteoporosis: Secondary | ICD-10-CM | POA: Diagnosis not present

## 2017-12-07 DIAGNOSIS — J449 Chronic obstructive pulmonary disease, unspecified: Secondary | ICD-10-CM | POA: Diagnosis not present

## 2017-12-07 DIAGNOSIS — M85852 Other specified disorders of bone density and structure, left thigh: Secondary | ICD-10-CM | POA: Diagnosis not present

## 2017-12-07 DIAGNOSIS — M85862 Other specified disorders of bone density and structure, left lower leg: Secondary | ICD-10-CM | POA: Insufficient documentation

## 2017-12-07 DIAGNOSIS — Z780 Asymptomatic menopausal state: Secondary | ICD-10-CM | POA: Diagnosis not present

## 2017-12-10 DIAGNOSIS — H353212 Exudative age-related macular degeneration, right eye, with inactive choroidal neovascularization: Secondary | ICD-10-CM | POA: Diagnosis not present

## 2017-12-13 DIAGNOSIS — H353221 Exudative age-related macular degeneration, left eye, with active choroidal neovascularization: Secondary | ICD-10-CM | POA: Diagnosis not present

## 2017-12-30 DIAGNOSIS — J441 Chronic obstructive pulmonary disease with (acute) exacerbation: Secondary | ICD-10-CM | POA: Diagnosis not present

## 2017-12-30 DIAGNOSIS — E039 Hypothyroidism, unspecified: Secondary | ICD-10-CM | POA: Diagnosis not present

## 2017-12-30 DIAGNOSIS — E119 Type 2 diabetes mellitus without complications: Secondary | ICD-10-CM | POA: Diagnosis not present

## 2017-12-30 DIAGNOSIS — I10 Essential (primary) hypertension: Secondary | ICD-10-CM | POA: Diagnosis not present

## 2017-12-30 DIAGNOSIS — C50512 Malignant neoplasm of lower-outer quadrant of left female breast: Secondary | ICD-10-CM | POA: Diagnosis not present

## 2018-01-03 ENCOUNTER — Telehealth: Payer: Self-pay | Admitting: *Deleted

## 2018-01-03 MED ORDER — ANASTROZOLE 1 MG PO TABS: 1.0000 mg | ORAL_TABLET | Freq: Every day | ORAL | 0 refills | Status: DC

## 2018-01-03 NOTE — Telephone Encounter (Signed)
Discontinue letrozole and start anastrozole in 3 weeks.  Thanks!

## 2018-01-03 NOTE — Telephone Encounter (Signed)
Patient called reporting that she cannot tolerate taking the Letrozole any more, she is having severe pain all over in bones and joints and nausea. She stopped taking it last Wednesday, asking if we can change to something else. She reports she already notices a difference in how she feels in the short time she has been off of it. Please advise

## 2018-01-03 NOTE — Telephone Encounter (Signed)
Patient informed of physician response and repeated back to me and asked that prescription go ahead and be sent in now

## 2018-01-04 ENCOUNTER — Other Ambulatory Visit: Payer: Self-pay | Admitting: Pulmonary Disease

## 2018-01-05 ENCOUNTER — Ambulatory Visit: Payer: PPO | Admitting: Oncology

## 2018-01-11 DIAGNOSIS — Z170 Estrogen receptor positive status [ER+]: Secondary | ICD-10-CM | POA: Diagnosis not present

## 2018-01-11 DIAGNOSIS — C50512 Malignant neoplasm of lower-outer quadrant of left female breast: Secondary | ICD-10-CM | POA: Diagnosis not present

## 2018-01-12 DIAGNOSIS — H353212 Exudative age-related macular degeneration, right eye, with inactive choroidal neovascularization: Secondary | ICD-10-CM | POA: Diagnosis not present

## 2018-01-17 DIAGNOSIS — H353221 Exudative age-related macular degeneration, left eye, with active choroidal neovascularization: Secondary | ICD-10-CM | POA: Diagnosis not present

## 2018-02-17 DIAGNOSIS — H353221 Exudative age-related macular degeneration, left eye, with active choroidal neovascularization: Secondary | ICD-10-CM | POA: Diagnosis not present

## 2018-02-18 DIAGNOSIS — H353212 Exudative age-related macular degeneration, right eye, with inactive choroidal neovascularization: Secondary | ICD-10-CM | POA: Diagnosis not present

## 2018-02-23 ENCOUNTER — Telehealth: Payer: Self-pay | Admitting: *Deleted

## 2018-02-23 NOTE — Telephone Encounter (Signed)
Call returned to patient and advised to stop medicine until she sees physician this month, I had to leave a message on answer

## 2018-02-23 NOTE — Telephone Encounter (Signed)
Hold everything until I see her on 1/15.

## 2018-02-23 NOTE — Telephone Encounter (Signed)
Patient called reporting that she cannot take the second round of medication either. She started on Arimidex 01/24/18. Please advise

## 2018-02-26 NOTE — Progress Notes (Signed)
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  Oncotype DX score 16, low risk.  INTERVAL HISTORY: Patient returns to clinic today for routine 58-monthfollow-up.  Patient could not tolerate anastrozole or letrozole.  She currently feels well and is asymptomatic.  She continues to be anxious.  She has no neurologic complaints.  She denies any recent fevers or illnesses.  She has a good appetite and denies weight loss.  She denies any pain.  She has no chest pain or shortness of breath.  She denies any nausea, vomiting, constipation, or diarrhea.  She has no urinary complaints.  Patient offers no specific complaints today.  REVIEW OF SYSTEMS:   Review of Systems  Constitutional: Negative.  Negative for fever, malaise/fatigue and weight loss.  Respiratory: Negative.  Negative for cough and shortness of breath.   Cardiovascular: Negative.  Negative for chest pain and leg swelling.  Gastrointestinal: Negative.  Negative for abdominal pain.  Genitourinary: Negative.  Negative for dysuria.  Musculoskeletal: Negative.  Negative for back pain.  Skin: Negative.  Negative for rash.  Neurological: Negative.  Negative for sensory change, focal weakness and weakness.  Psychiatric/Behavioral: The patient is nervous/anxious.     As per HPI. Otherwise, a complete review of systems is negative.  PAST MEDICAL HISTORY: Past Medical History:  Diagnosis Date  . Arthritis   . Bronchitis   . Bruises easily   . Cancer (HCC)    breast  . Cataracts, bilateral   . Chronic airway obstruction,  not elsewhere classified    Symbicort daily and Albuterol daily as needed  . Chronic back pain    arthritis  . Chronic bronchitis ([place]   . Cramps of left lower extremity   . Diabetes mellitus without complication ([place]    borderline-diet and exercise;takes Cinnamon daily  . Esophageal reflux    takes Nexium daily and Dexilant daily as needed  . Fibromyalgia   . Fibromyalgia   . Headache    occasionally  . HOH (hard of hearing)   . Hyperlipidemia    takes CO Q10 daily  . Hypothyroidism   . Insomnia    takes Ambien and Trazodone nightly  . Joint pain   . Joint swelling   . Macular degeneration, right eye    wet and to get injection tomorrow  . Pneumonia    hx of x 3 with most recent one 2009  . Seasonal allergies    takes Zyrtec daily as well as using Nasonex  . Shortness of breath dyspnea   . Shoulder fracture   . Unspecified essential hypertension    takes Amlodipine and Losartan daily  . URI (upper respiratory infection)    was on Prednisone and Antibiotic-to complete today  . Urinary urgency     PAST SURGICAL HISTORY: Past Surgical History:  Procedure Laterality Date  . BREAST SURGERY  1972   nodules removed and benign  . CATARACT EXTRACTION W/PHACO Left 12/31/2014   Procedure: CATARACT EXTRACTION PHACO AND INTRAOCULAR LENS PLACEMENT (IOC);  Surgeon: SEstill Cotta MD;  Location: [facility] OR[facility];  Service: Ophthalmology;  Laterality: Left;  [place] AP% 24.0 CDE 25.53 fluid  pack # [id number]  . CATARACT EXTRACTION W/PHACO Right 04/27/2017   Procedure: CATARACT EXTRACTION PHACO AND INTRAOCULAR LENS PLACEMENT (IOC);  Surgeon: Birder Robson, MD;  Location: [facility];  Service: Ophthalmology;  Laterality: Right;  [place] 00:46.3 AP% 12.7 CDE 5.86 Fluid Pack lot # [id number]  . DEBRIDEMENT AND CLOSURE WOUND Left 09/10/2017   Procedure: CLOSURE OF LEFT BREAST MASTECTOMY/WOUND;  Surgeon: Wallace Going, DO;  Location: [facility];  Service: Plastics;  Laterality: Left;  .  JOINT REPLACEMENT     SHOULDER left  . MASTECTOMY W/ SENTINEL NODE BIOPSY Left 08/18/2017   Procedure: MASTECTOMY WITH SENTINEL LYMPH NODE BIOPSY;  Surgeon: [name], MD;  Location: [facility];  Service: General;  Laterality: Left;  . RE-EXCISION OF BREAST LUMPECTOMY Left 09/10/2017   Procedure: RE-EXCISION OF BREAST LUMPECTOMY;  Surgeon: [name], MD;  Location: [facility];  Service: General;  Laterality: Left;  . REVERSE SHOULDER ARTHROPLASTY Left 04/19/2014   Procedure:  REVERSE TOTAL SHOULDER ARTHROPLASTY;  Surgeon: Marin Shutter, MD;  Location: [facility];  Service: Orthopedics;  Laterality: Left;    FAMILY HISTORY: Family History  Problem Relation Age of Onset  . Heart disease Unknown   . Breast cancer Neg Hx     ADVANCED DIRECTIVES (Y/N):  N  HEALTH MAINTENANCE: Social History   Tobacco Use  . Smoking status: Former Smoker    Packs/day: 2.00    Years: 30.00    Pack years: 60.00    Types: Cigarettes    Last attempt to quit: 02/17/2004    Years since quitting: 14.0  . Smokeless tobacco: Never Used  . Tobacco comment: quit smoking in 2006  Substance Use Topics  . Alcohol use: No    Alcohol/week: 0.0 standard drinks  . Drug use: No     Colonoscopy:  PAP:  Bone density:  Lipid panel:  Allergies  Allergen Reactions  . Protonix [Pantoprazole Sodium] Other (See Comments)    abd cramps  . Prednisone Other (See Comments)    Agitation, insomina, mood change. Tolerates injection- not PO Agitation, insomina, mood change. Tolerates injection- not PO  . Statins Other (See Comments)    Legs hurt Legs hurt Legs hurt    Current Outpatient Medications  Medication Sig Dispense Refill  . acetaminophen (TYLENOL) 500 MG tablet Take 500 mg by mouth every 6 (six) hours as needed for moderate pain or headache.     . albuterol (PROAIR HFA) 108 (90 BASE) MCG/ACT inhaler Inhale 2 puffs into the lungs every 4 (four) hours as needed. (Patient taking differently: Inhale 2  puffs into the lungs every 4 (four) hours as needed for wheezing or shortness of breath. ) 1 Inhaler 3  . albuterol (PROVENTIL) (2.5 MG/3ML) 0.083% nebulizer solution Take 2.5 mg by nebulization 2 (two) times daily as needed for wheezing or shortness of breath.     Marland Kitchen amLODipine (NORVASC) 5 MG tablet Take 5 mg by mouth daily.    . Bevacizumab (AVASTIN IV) Place 1 Dose into both eyes every 30 (thirty) days.     . cetirizine (ZYRTEC) 10 MG tablet Take 10 mg by mouth at bedtime.     . cholecalciferol (VITAMIN D) 1000 UNITS tablet Take 1,000 Units by mouth daily.    . clotrimazole (LOTRIMIN) 1 % cream Apply 1 application topically 2 (two) times daily as needed (for yeast infection).    . Coenzyme Q10 (CO Q 10) 10 MG CAPS Take 10 mg by mouth daily.     . diazepam (  VALIUM) 5 MG tablet Take 0.5-1 tablets (2.5-5 mg total) by mouth every 6 (six) hours as needed for muscle spasms or sedation. (Patient taking differently: Take 5 mg by mouth at bedtime. ) 40 tablet 1  . diclofenac (VOLTAREN) 75 MG EC tablet Take 75 mg by mouth 2 (two) times daily.      Marland Kitchen esomeprazole (NEXIUM) 40 MG capsule Take 40 mg by mouth daily before breakfast.     . famotidine (PEPCID) 20 MG tablet Take 1 tablet (20 mg total) by mouth 2 (two) times daily. When coughing (Patient taking differently: Take 20 mg by mouth 2 (two) times daily as needed (for coughing). ) 60 tablet 6  . Homeopathic Products ([employer] LEG CRAMPS PAIN RELIEF PO) Take 2 tablets by mouth every 4 (four) hours as needed (for leg cramps).     Marland Kitchen HYDROcodone-acetaminophen (NORCO/VICODIN) 5-325 MG tablet Take 1 tablet by mouth every 4 (four) hours as needed for moderate pain. 15 tablet 0  . levothyroxine (SYNTHROID, LEVOTHROID) 25 MCG tablet Take 25 mcg by mouth daily before breakfast.    . losartan (COZAAR) 100 MG tablet Take 100 mg by mouth daily.     . Magnesium 400 MG CAPS Take 400 mg by mouth See admin instructions. Take 400 mg by mouth in the morning every day and take 400  mg by mouth 3 times weekly at bedtime    . metoprolol succinate (TOPROL-XL) 25 MG 24 hr tablet Take 25 mg by mouth daily.    . mometasone (NASONEX) 50 MCG/ACT nasal spray Place 1 spray into the nose daily. 17 g 11  . Multiple Vitamin (MULTIVITAMIN WITH MINERALS) TABS tablet Take 1 tablet by mouth daily.    Marland Kitchen nystatin (MYCOSTATIN) 100000 UNIT/ML suspension Take 5 mLs (500,000 Units total) by mouth as needed. (Patient taking differently: Take 5 mLs by mouth daily as needed (for mouth due to inhaler). ) 60 mL 5  . Polyethyl Glycol-Propyl Glycol (SYSTANE OP) Place 1 drop into both eyes 3 (three) times daily as needed (for dry eyes (at least once daily)).     . SYMBICORT 160-4.5 MCG/ACT inhaler Inhale 2 puffs into the lungs 2 (two) times daily. 10.2 Inhaler 1  . vitamin C (ASCORBIC ACID) 500 MG tablet Take 500 mg by mouth daily.    Marland Kitchen anastrozole (ARIMIDEX) 1 MG tablet Take 1 tablet (1 mg total) by mouth daily. Start this drug on 01/24/18 (Patient not taking: Reported on 03/02/2018) 30 tablet 0   No current facility-administered medications for this visit.     OBJECTIVE: Vitals:   03/02/18 1105  BP: (!) 160/69  Pulse: 69  Temp: (!) 97.5 F (36.4 C)     Body mass index is 32.56 kg/m.    ECOG FS:0 - Asymptomatic  General: Well-developed, well-nourished, no acute distress. Eyes: Pink conjunctiva, anicteric sclera. HEENT: Normocephalic, moist mucous membranes, clear oropharnyx. Breast: Patient declined exam today. Lungs: Clear to auscultation bilaterally. Heart: Regular rate and rhythm. No rubs, murmurs, or gallops. Abdomen: Soft, nontender, nondistended. No organomegaly noted, normoactive bowel sounds. Musculoskeletal: No edema, cyanosis, or clubbing. Neuro: Alert, answering all questions appropriately. Cranial nerves grossly intact. Skin: No rashes or petechiae noted. Psych: Normal affect.  LAB RESULTS:  Lab Results  Component Value Date   NA 140 04/10/2014   K 4.1 04/10/2014   CL 104  04/10/2014   CO2 25 04/10/2014   GLUCOSE 107 (H) 04/10/2014   BUN 17 04/10/2014   CREATININE 0.73 08/18/2017   CALCIUM 9.0  04/10/2014   GFRNONAA >60 08/18/2017   GFRAA >60 08/18/2017    Lab Results  Component Value Date   WBC 12.3 (H) 09/03/2017   HGB 12.8 09/03/2017   HCT 38.0 09/03/2017   MCV 86.8 09/03/2017   PLT 426 09/03/2017     STUDIES: No results found.  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  Oncotype DX score 16 which is low risk. PLAN:   1.  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  She was noted to have micrometastasis in her sentinel lymph node.  Although adjuvant XRT was recommended, patient declined.  She has discontinued anastrozole and letrozole secondary to side effects, but has agreed to try tamoxifen which she will take for a total of 5 years completing in October 2024.  Patient will require repeat mammogram in May 2020.  Return to clinic in 6 months for routine evaluation.   2.  Osteopenia: Bone mineral density on December 07, 2017 revealed a T score of -2.1 which is improved from July 2017 where her T score was reported -2.3.  Continue Fosamax, calcium, and vitamin D supplementation.  Repeat bone mineral densities per primary care.    I spent a total of 30 minutes face-to-face with the patient of which greater than 50% of the visit was spent in counseling and coordination of care as detailed above.   Patient expressed understanding and was in agreement with this plan. She also understands that She can call clinic at any time with any questions, concerns, or complaints.   Cancer Staging Primary cancer of lower outer quadrant of left female breast [facility]) Staging form: Breast, AJCC 8th Edition - Clinical  stage from 07/27/2017: Stage IIIB (cT4b, cN70m, cM0, G1, ER+, PR+, HER2-) - Signed by FLloyd Huger MD on 08/31/2017   TLloyd Huger MD   03/03/2018 6:48 AM

## 2018-03-02 ENCOUNTER — Other Ambulatory Visit: Payer: Self-pay

## 2018-03-02 ENCOUNTER — Inpatient Hospital Stay: Payer: PPO | Attending: Oncology | Admitting: Oncology

## 2018-03-02 VITALS — BP 160/69 | HR 69 | Temp 97.5°F | Wt 178.0 lb

## 2018-03-02 DIAGNOSIS — C50512 Malignant neoplasm of lower-outer quadrant of left female breast: Secondary | ICD-10-CM

## 2018-03-02 DIAGNOSIS — M8580 Other specified disorders of bone density and structure, unspecified site: Secondary | ICD-10-CM | POA: Insufficient documentation

## 2018-03-02 DIAGNOSIS — Z170 Estrogen receptor positive status [ER+]: Secondary | ICD-10-CM | POA: Diagnosis not present

## 2018-03-02 NOTE — Progress Notes (Signed)
Patient is here today to follow up on her primary cancer of lower outer quadrant of left female breast. Patient stated that she had been doing well at this time. Patient stated that she is due for her mammogram in June of this year and then she will follow up with her general surgeon-Dr. Windell Moment.

## 2018-03-04 ENCOUNTER — Telehealth: Payer: Self-pay | Admitting: *Deleted

## 2018-03-04 MED ORDER — TAMOXIFEN CITRATE 20 MG PO TABS
20.0000 mg | ORAL_TABLET | Freq: Every day | ORAL | 0 refills | Status: AC
Start: 2018-03-04 — End: ?

## 2018-03-04 NOTE — Telephone Encounter (Signed)
Medicine is not at pharmacy. Tamoxifen prescription sent and patient informed

## 2018-03-14 DIAGNOSIS — H9319 Tinnitus, unspecified ear: Secondary | ICD-10-CM | POA: Diagnosis not present

## 2018-03-14 DIAGNOSIS — H6123 Impacted cerumen, bilateral: Secondary | ICD-10-CM | POA: Diagnosis not present

## 2018-03-21 DIAGNOSIS — H353221 Exudative age-related macular degeneration, left eye, with active choroidal neovascularization: Secondary | ICD-10-CM | POA: Diagnosis not present

## 2018-04-01 DIAGNOSIS — H353212 Exudative age-related macular degeneration, right eye, with inactive choroidal neovascularization: Secondary | ICD-10-CM | POA: Diagnosis not present

## 2018-04-05 DIAGNOSIS — C50512 Malignant neoplasm of lower-outer quadrant of left female breast: Secondary | ICD-10-CM | POA: Diagnosis not present

## 2018-04-05 DIAGNOSIS — I10 Essential (primary) hypertension: Secondary | ICD-10-CM | POA: Diagnosis not present

## 2018-04-05 DIAGNOSIS — E119 Type 2 diabetes mellitus without complications: Secondary | ICD-10-CM | POA: Diagnosis not present

## 2018-04-05 DIAGNOSIS — J441 Chronic obstructive pulmonary disease with (acute) exacerbation: Secondary | ICD-10-CM | POA: Diagnosis not present

## 2018-04-20 DIAGNOSIS — H353221 Exudative age-related macular degeneration, left eye, with active choroidal neovascularization: Secondary | ICD-10-CM | POA: Diagnosis not present

## 2018-05-11 DIAGNOSIS — H353212 Exudative age-related macular degeneration, right eye, with inactive choroidal neovascularization: Secondary | ICD-10-CM | POA: Diagnosis not present

## 2018-05-25 DIAGNOSIS — I498 Other specified cardiac arrhythmias: Secondary | ICD-10-CM | POA: Diagnosis not present

## 2018-05-25 DIAGNOSIS — I4892 Unspecified atrial flutter: Secondary | ICD-10-CM | POA: Diagnosis not present

## 2018-05-25 DIAGNOSIS — R9431 Abnormal electrocardiogram [ECG] [EKG]: Secondary | ICD-10-CM | POA: Diagnosis not present

## 2018-05-25 DIAGNOSIS — H353221 Exudative age-related macular degeneration, left eye, with active choroidal neovascularization: Secondary | ICD-10-CM | POA: Diagnosis not present

## 2018-06-15 DIAGNOSIS — H353212 Exudative age-related macular degeneration, right eye, with inactive choroidal neovascularization: Secondary | ICD-10-CM | POA: Diagnosis not present

## 2018-07-01 DIAGNOSIS — H353221 Exudative age-related macular degeneration, left eye, with active choroidal neovascularization: Secondary | ICD-10-CM | POA: Diagnosis not present

## 2018-07-04 DIAGNOSIS — C441122 Basal cell carcinoma of skin of right lower eyelid, including canthus: Secondary | ICD-10-CM | POA: Diagnosis not present

## 2018-07-04 DIAGNOSIS — L859 Epidermal thickening, unspecified: Secondary | ICD-10-CM | POA: Diagnosis not present

## 2018-07-12 ENCOUNTER — Other Ambulatory Visit: Payer: PPO

## 2018-07-25 ENCOUNTER — Other Ambulatory Visit: Payer: Self-pay

## 2018-07-25 ENCOUNTER — Other Ambulatory Visit: Payer: Self-pay | Admitting: Oncology

## 2018-07-25 ENCOUNTER — Ambulatory Visit
Admission: RE | Admit: 2018-07-25 | Discharge: 2018-07-25 | Disposition: A | Discharge status: Home / Self Care | Payer: PPO | Source: Ambulatory Visit | Attending: Oncology | Admitting: Oncology

## 2018-07-25 DIAGNOSIS — Z1231 Encounter for screening mammogram for malignant neoplasm of breast: Secondary | ICD-10-CM | POA: Insufficient documentation

## 2018-07-25 DIAGNOSIS — C50512 Malignant neoplasm of lower-outer quadrant of left female breast: Secondary | ICD-10-CM

## 2018-07-25 DIAGNOSIS — Z9012 Acquired absence of left breast and nipple: Secondary | ICD-10-CM | POA: Insufficient documentation

## 2018-07-25 DIAGNOSIS — Z853 Personal history of malignant neoplasm of breast: Secondary | ICD-10-CM | POA: Diagnosis not present

## 2018-07-27 DIAGNOSIS — H353212 Exudative age-related macular degeneration, right eye, with inactive choroidal neovascularization: Secondary | ICD-10-CM | POA: Diagnosis not present

## 2018-08-01 DIAGNOSIS — H353221 Exudative age-related macular degeneration, left eye, with active choroidal neovascularization: Secondary | ICD-10-CM | POA: Diagnosis not present

## 2018-08-02 DIAGNOSIS — Z853 Personal history of malignant neoplasm of breast: Secondary | ICD-10-CM | POA: Diagnosis not present

## 2018-08-12 DIAGNOSIS — S92414A Nondisplaced fracture of proximal phalanx of right great toe, initial encounter for closed fracture: Secondary | ICD-10-CM | POA: Diagnosis not present

## 2018-08-26 DIAGNOSIS — M7542 Impingement syndrome of left shoulder: Secondary | ICD-10-CM | POA: Diagnosis not present

## 2018-08-26 DIAGNOSIS — Z96612 Presence of left artificial shoulder joint: Secondary | ICD-10-CM | POA: Diagnosis not present

## 2018-08-26 DIAGNOSIS — S92414A Nondisplaced fracture of proximal phalanx of right great toe, initial encounter for closed fracture: Secondary | ICD-10-CM | POA: Diagnosis not present

## 2018-08-29 DIAGNOSIS — H353221 Exudative age-related macular degeneration, left eye, with active choroidal neovascularization: Secondary | ICD-10-CM | POA: Diagnosis not present

## 2018-09-01 ENCOUNTER — Ambulatory Visit: Payer: PPO | Admitting: Oncology

## 2018-09-07 DIAGNOSIS — H353212 Exudative age-related macular degeneration, right eye, with inactive choroidal neovascularization: Secondary | ICD-10-CM | POA: Diagnosis not present

## 2018-09-08 DIAGNOSIS — H26492 Other secondary cataract, left eye: Secondary | ICD-10-CM | POA: Diagnosis not present

## 2018-09-23 DIAGNOSIS — S92414A Nondisplaced fracture of proximal phalanx of right great toe, initial encounter for closed fracture: Secondary | ICD-10-CM | POA: Diagnosis not present

## 2018-10-03 DIAGNOSIS — H353221 Exudative age-related macular degeneration, left eye, with active choroidal neovascularization: Secondary | ICD-10-CM | POA: Diagnosis not present

## 2018-10-14 DIAGNOSIS — H353212 Exudative age-related macular degeneration, right eye, with inactive choroidal neovascularization: Secondary | ICD-10-CM | POA: Diagnosis not present

## 2018-10-20 DIAGNOSIS — E785 Hyperlipidemia, unspecified: Secondary | ICD-10-CM | POA: Diagnosis not present

## 2018-10-20 DIAGNOSIS — J449 Chronic obstructive pulmonary disease, unspecified: Secondary | ICD-10-CM | POA: Diagnosis not present

## 2018-10-20 DIAGNOSIS — I10 Essential (primary) hypertension: Secondary | ICD-10-CM | POA: Diagnosis not present

## 2018-10-20 DIAGNOSIS — E119 Type 2 diabetes mellitus without complications: Secondary | ICD-10-CM | POA: Diagnosis not present

## 2018-10-20 DIAGNOSIS — F418 Other specified anxiety disorders: Secondary | ICD-10-CM | POA: Diagnosis not present

## 2018-11-02 DIAGNOSIS — S92414A Nondisplaced fracture of proximal phalanx of right great toe, initial encounter for closed fracture: Secondary | ICD-10-CM | POA: Diagnosis not present

## 2018-11-07 DIAGNOSIS — H353221 Exudative age-related macular degeneration, left eye, with active choroidal neovascularization: Secondary | ICD-10-CM | POA: Diagnosis not present

## 2018-11-18 DIAGNOSIS — H353212 Exudative age-related macular degeneration, right eye, with inactive choroidal neovascularization: Secondary | ICD-10-CM | POA: Diagnosis not present

## 2018-12-07 IMAGING — MG MM BREAST LOCALIZATION CLIP
2 series · 2 of 2 positions shown · non-contrast
Comparison: Previous exam(s).

CLINICAL DATA: Status post ultrasound-guided core needle biopsy of
the recently demonstrated 3.3 cm mass in the 5 o'clock position of
the left breast.

EXAM:
DIAGNOSTIC LEFT MAMMOGRAM POST ULTRASOUND BIOPSY

[L CC]
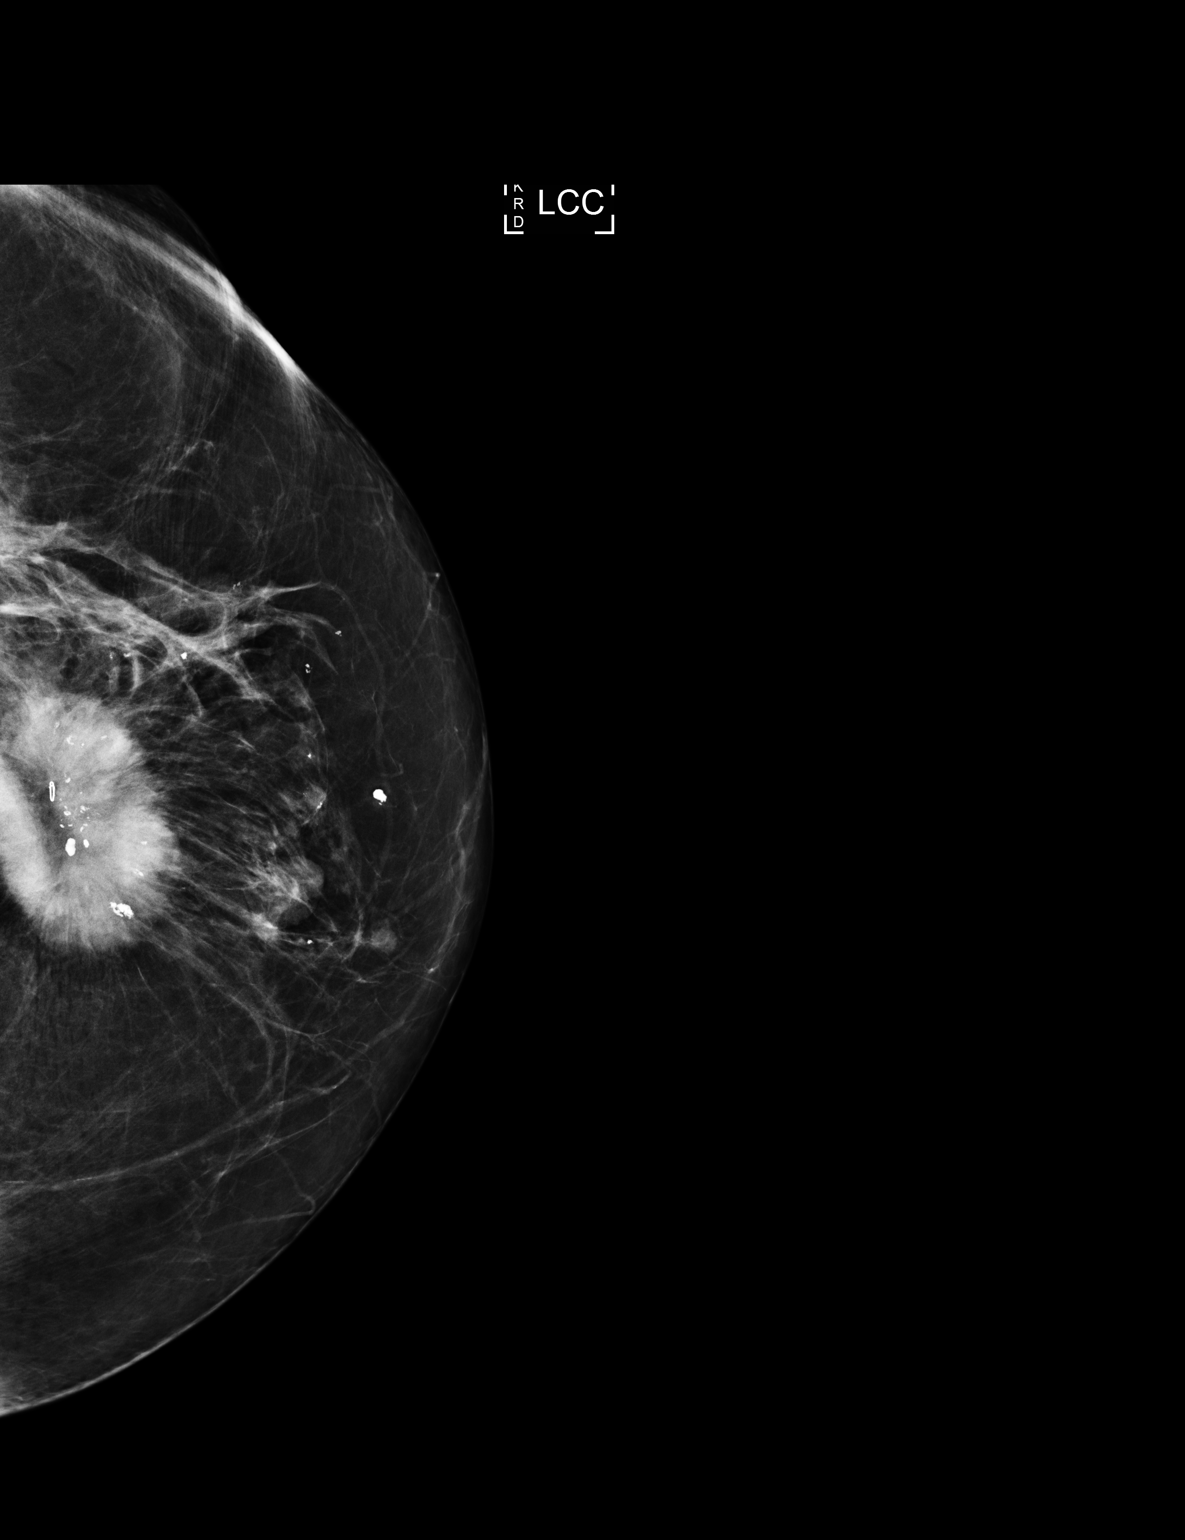

[L ML]
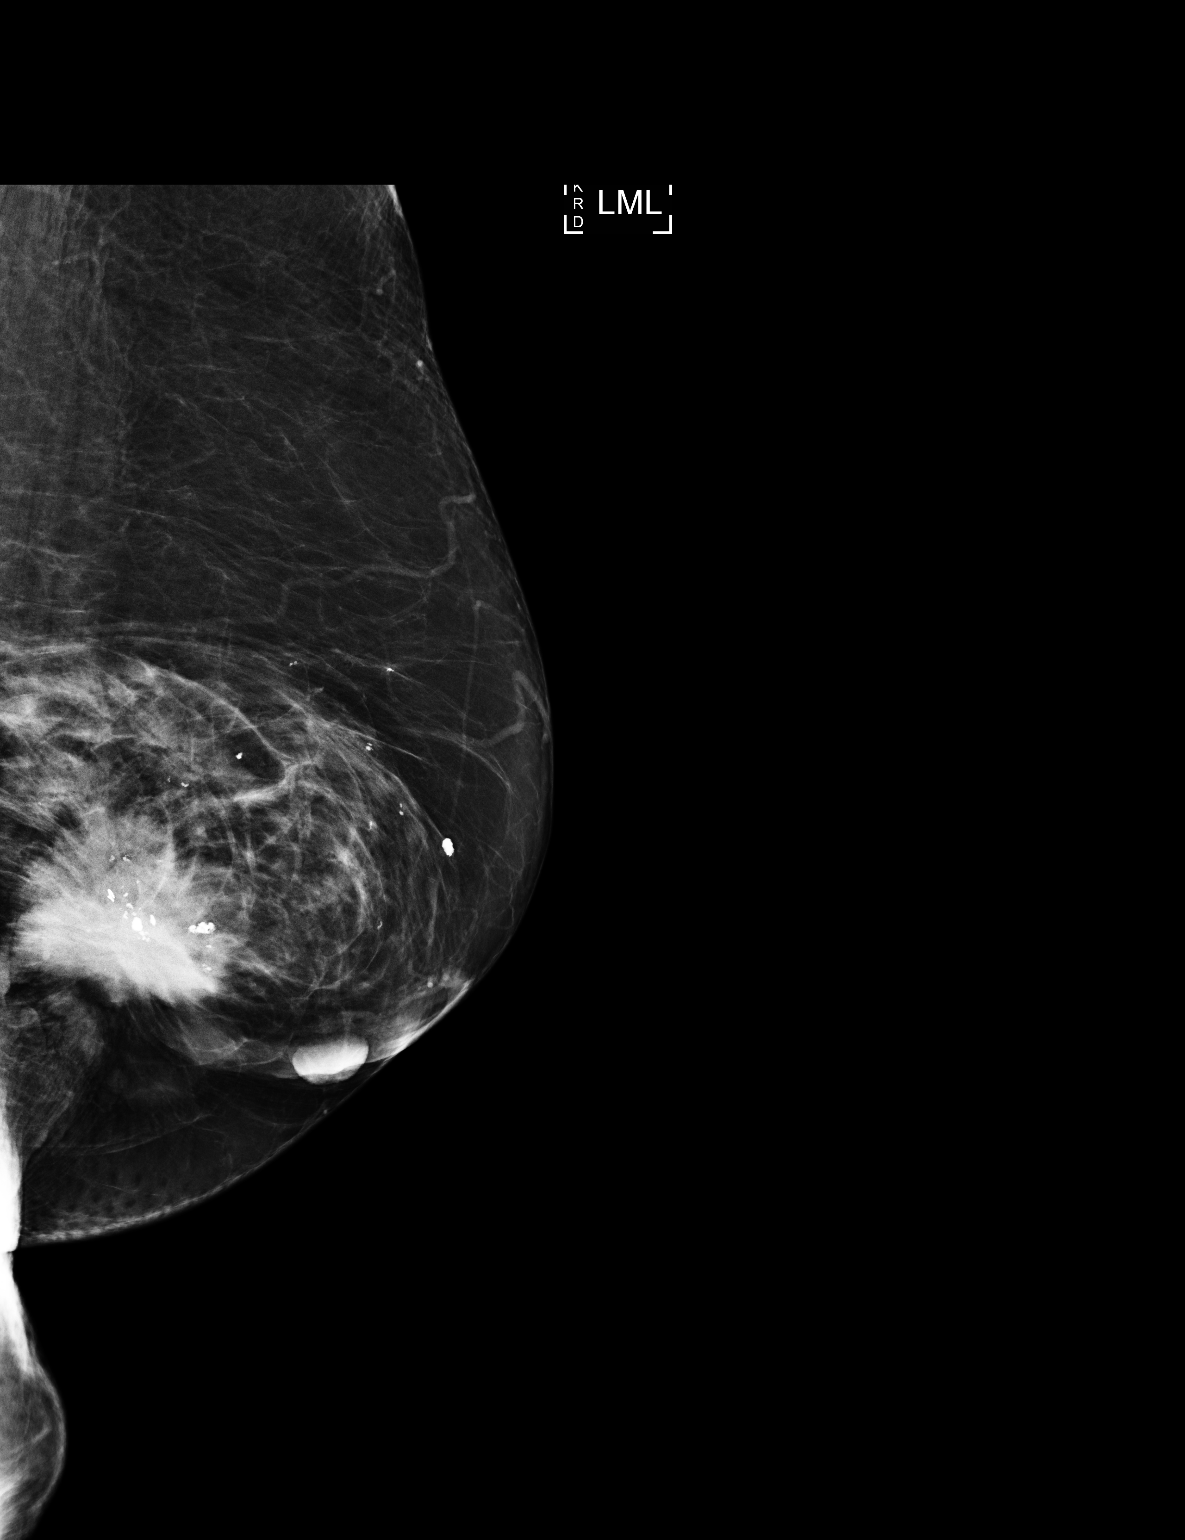

[2 of 2 positions shown; findings below may reference images not displayed]

FINDINGS: Mammographic images were obtained following ultrasound guided biopsy
of the recently demonstrated 3.3 cm mass in the 5 o'clock position
of the left breast. These demonstrate a heart shaped biopsy marker
clip within the biopsied mass.
IMPRESSION: Appropriate clip deployment following left breast ultrasound-guided
core needle biopsy.

Final Assessment: Post Procedure Mammograms for Marker Placement

## 2018-12-07 IMAGING — MG US BREAST BX W LOC DEV 1ST LESION IMG BX SPEC US GUIDE*L*
1 series · 8 of 8 positions shown · non-contrast
Comparison: Previous exam(s).

ADDENDUM:
Pathology results: Pathology results from the ultrasound-guided
biopsy of the mass in the left breast at the 5 o'clock position
revealed invasive mammary carcinoma, grade 1.. This is concordant
with the imaging findings. The patient has been notified of the
results. Other than soreness, she is doing well and denies any
biopsy site complications.

Referral to an oncologist is recommended and this will be made by
the [REDACTED] navigator. The patient has been instructed to call
the [REDACTED] with any questions or concerns.
CLINICAL DATA: 3.3 cm ulcerated mass with imaging features highly
suspicious for malignancy in the 5 o'clock position of the left
breast.
EXAM:
ULTRASOUND GUIDED LEFT BREAST CORE NEEDLE BIOPSY

[Series 1: MG view · 0.07mm/px · 8 of 11 slices shown]
[im 1/11]
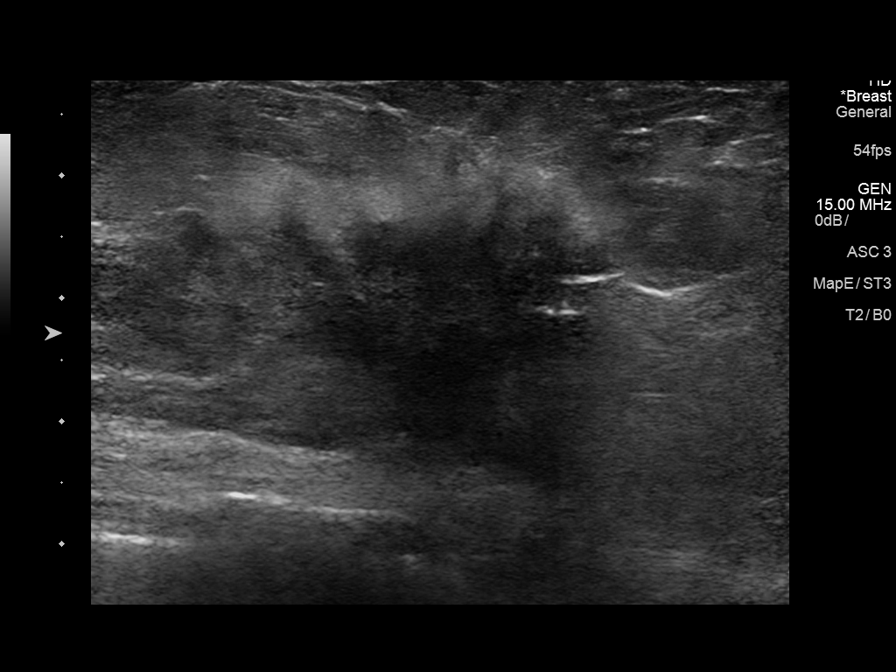
[im 2/11]
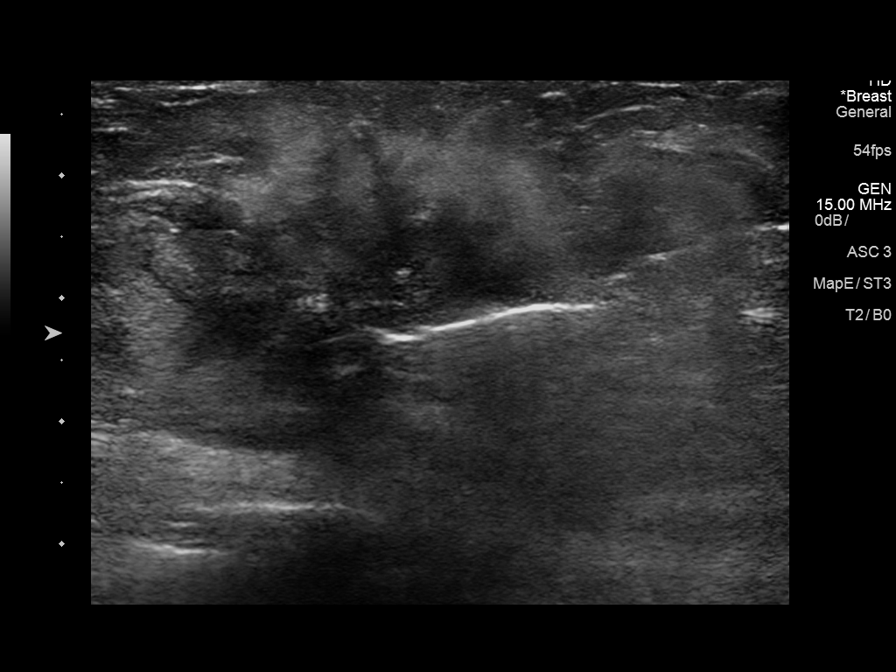
[im 3/11]
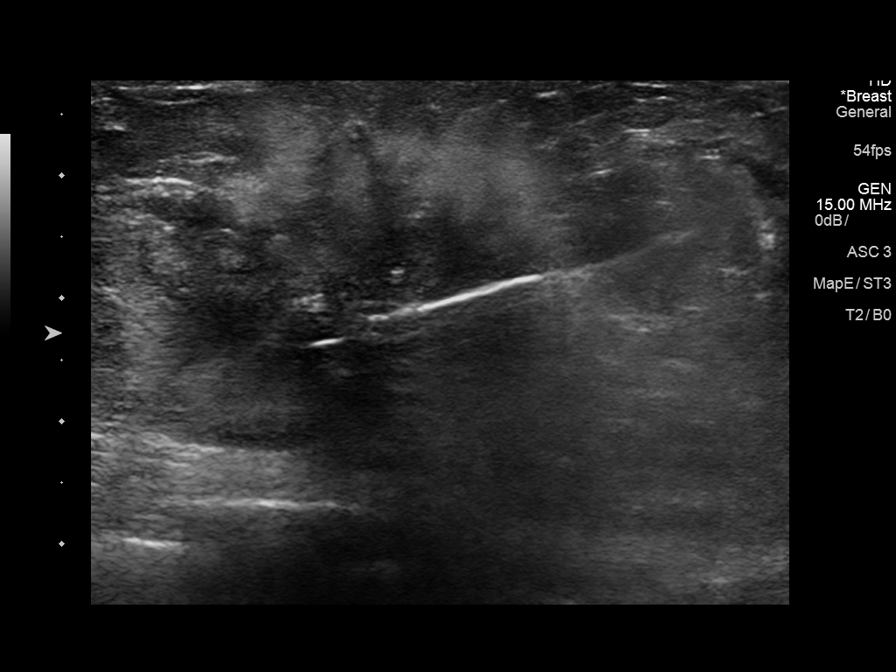
[im 5/11]
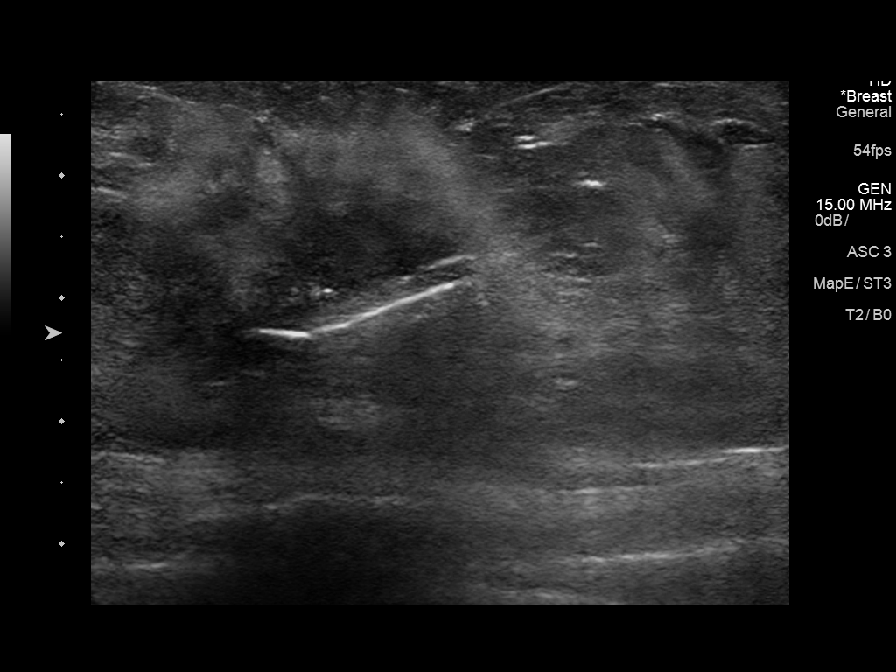
[im 6/11]
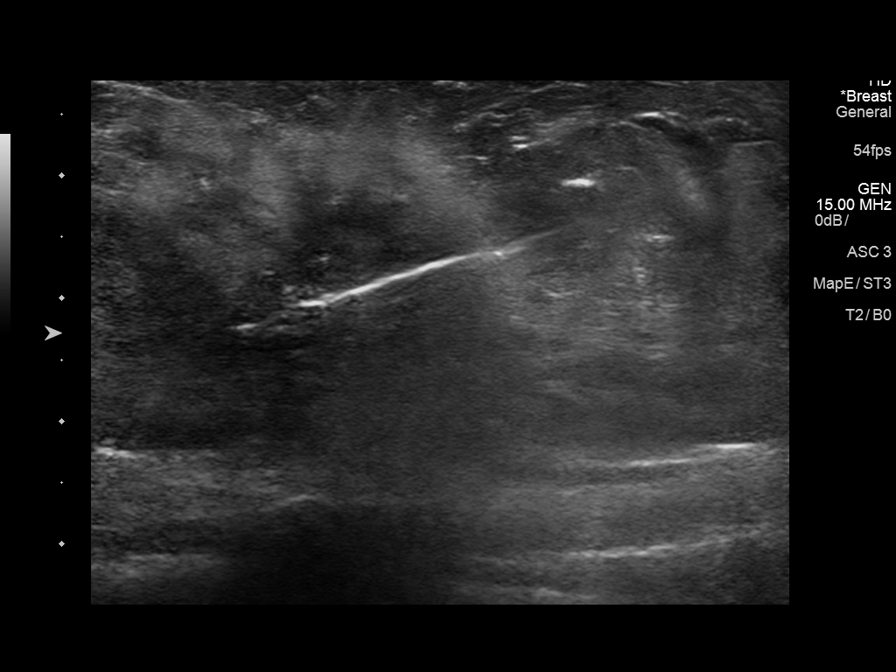
[im 8/11]
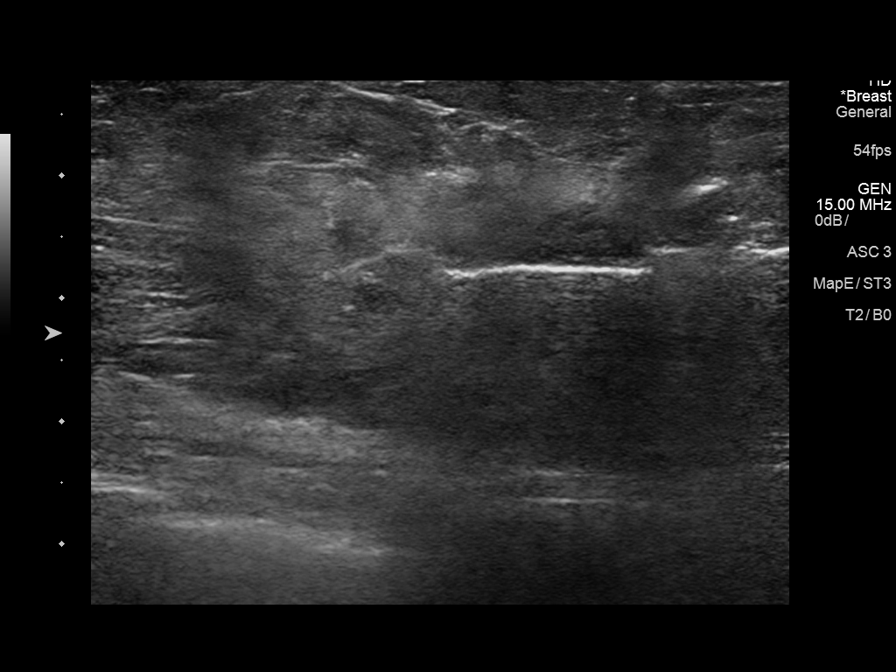
[im 9/11]
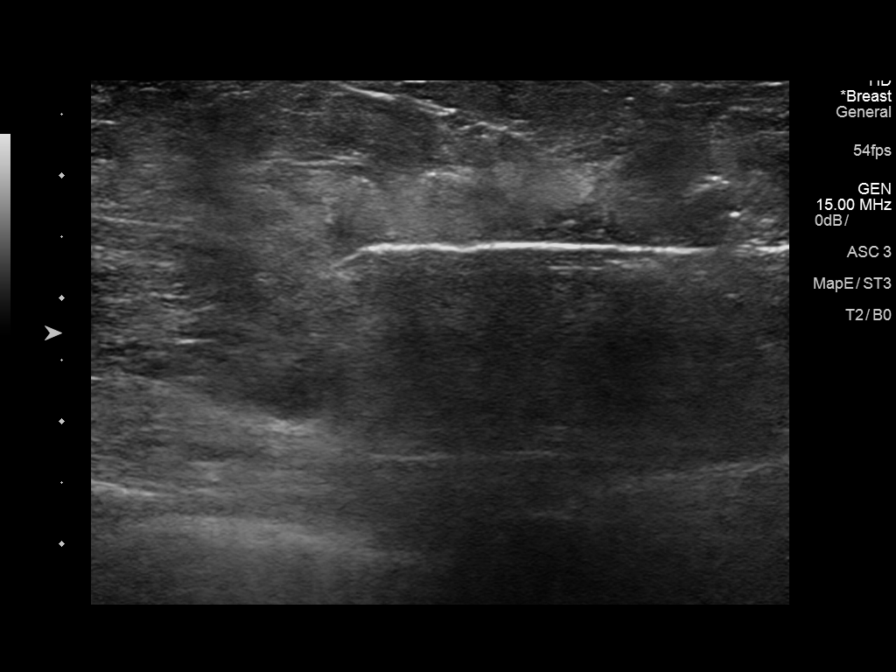
[im 11/11]
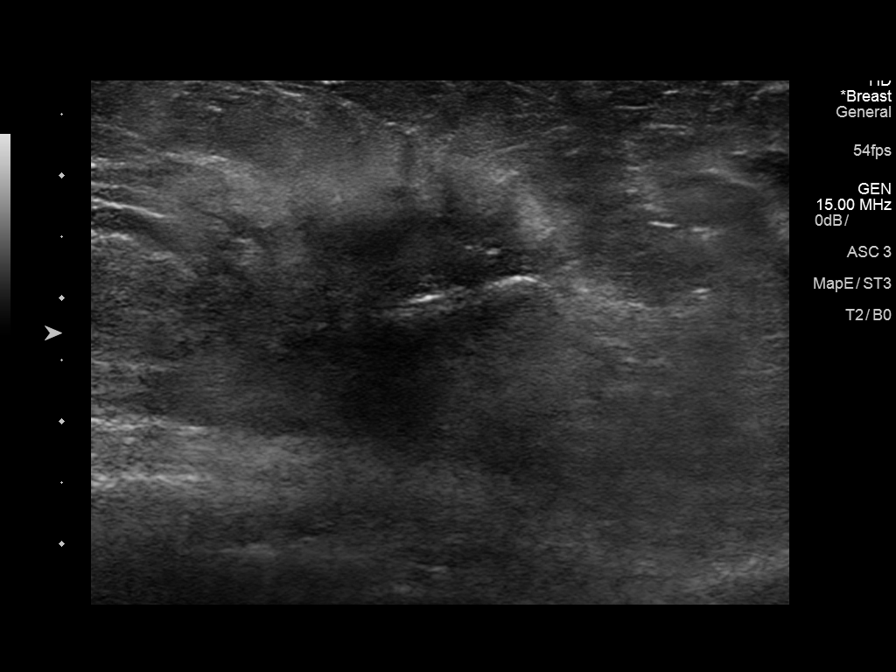

[8 of 8 positions shown; findings below may reference images not displayed]



Lesion quadrant: Lower outer quadrant

Using sterile technique and 1% Lidocaine as local anesthetic, under
direct ultrasound visualization, a 12 gauge Joreek device was
used to perform biopsy of the recently demonstrated 3.3 cm mass in
the 5 o'clock position of the left breast using a lateral approach.
At the conclusion of the procedure a heart shaped tissue marker clip
was deployed into the biopsy cavity. Follow up 2 view mammogram was
performed and dictated separately.
IMPRESSION: Ultrasound guided biopsy of the recently demonstrated 3.3 cm mass in
the 5 o'clock position of the left breast. No apparent
complications.

## 2018-12-12 DIAGNOSIS — H353221 Exudative age-related macular degeneration, left eye, with active choroidal neovascularization: Secondary | ICD-10-CM | POA: Diagnosis not present

## 2018-12-19 DIAGNOSIS — J441 Chronic obstructive pulmonary disease with (acute) exacerbation: Secondary | ICD-10-CM | POA: Diagnosis not present

## 2018-12-23 DIAGNOSIS — Z20828 Contact with and (suspected) exposure to other viral communicable diseases: Secondary | ICD-10-CM | POA: Diagnosis not present

## 2018-12-26 ENCOUNTER — Emergency Department: Payer: PPO

## 2018-12-26 ENCOUNTER — Inpatient Hospital Stay: Payer: PPO

## 2018-12-26 ENCOUNTER — Inpatient Hospital Stay (HOSPITAL_COMMUNITY)
Admission: AD | Admit: 2018-12-26 | Discharge: 2019-01-17 | DRG: 208 | Disposition: E | Payer: PPO | Source: Other Acute Inpatient Hospital | Attending: Internal Medicine | Admitting: Internal Medicine

## 2018-12-26 ENCOUNTER — Encounter: Payer: Self-pay | Admitting: Emergency Medicine

## 2018-12-26 ENCOUNTER — Inpatient Hospital Stay
Admission: EM | Admit: 2018-12-26 | Discharge: 2018-12-26 | DRG: 177 | Disposition: A | Discharge status: Other Acute Inpatient Hospital | Payer: PPO | Attending: Internal Medicine | Admitting: Internal Medicine

## 2018-12-26 DIAGNOSIS — Z853 Personal history of malignant neoplasm of breast: Secondary | ICD-10-CM | POA: Diagnosis not present

## 2018-12-26 DIAGNOSIS — G8929 Other chronic pain: Secondary | ICD-10-CM | POA: Diagnosis present

## 2018-12-26 DIAGNOSIS — M797 Fibromyalgia: Secondary | ICD-10-CM | POA: Diagnosis present

## 2018-12-26 DIAGNOSIS — Z79890 Hormone replacement therapy (postmenopausal): Secondary | ICD-10-CM

## 2018-12-26 DIAGNOSIS — M549 Dorsalgia, unspecified: Secondary | ICD-10-CM | POA: Diagnosis present

## 2018-12-26 DIAGNOSIS — K219 Gastro-esophageal reflux disease without esophagitis: Secondary | ICD-10-CM | POA: Diagnosis not present

## 2018-12-26 DIAGNOSIS — I10 Essential (primary) hypertension: Secondary | ICD-10-CM | POA: Diagnosis not present

## 2018-12-26 DIAGNOSIS — J9600 Acute respiratory failure, unspecified whether with hypoxia or hypercapnia: Secondary | ICD-10-CM | POA: Diagnosis present

## 2018-12-26 DIAGNOSIS — J9601 Acute respiratory failure with hypoxia: Secondary | ICD-10-CM | POA: Diagnosis not present

## 2018-12-26 DIAGNOSIS — I110 Hypertensive heart disease with heart failure: Secondary | ICD-10-CM | POA: Diagnosis present

## 2018-12-26 DIAGNOSIS — J189 Pneumonia, unspecified organism: Secondary | ICD-10-CM

## 2018-12-26 DIAGNOSIS — J440 Chronic obstructive pulmonary disease with acute lower respiratory infection: Secondary | ICD-10-CM | POA: Diagnosis not present

## 2018-12-26 DIAGNOSIS — M1990 Unspecified osteoarthritis, unspecified site: Secondary | ICD-10-CM | POA: Diagnosis present

## 2018-12-26 DIAGNOSIS — E039 Hypothyroidism, unspecified: Secondary | ICD-10-CM | POA: Diagnosis present

## 2018-12-26 DIAGNOSIS — E1165 Type 2 diabetes mellitus with hyperglycemia: Secondary | ICD-10-CM | POA: Diagnosis present

## 2018-12-26 DIAGNOSIS — IMO0002 Reserved for concepts with insufficient information to code with codable children: Secondary | ICD-10-CM | POA: Diagnosis present

## 2018-12-26 DIAGNOSIS — I468 Cardiac arrest due to other underlying condition: Secondary | ICD-10-CM | POA: Diagnosis not present

## 2018-12-26 DIAGNOSIS — R05 Cough: Secondary | ICD-10-CM | POA: Diagnosis not present

## 2018-12-26 DIAGNOSIS — H9190 Unspecified hearing loss, unspecified ear: Secondary | ICD-10-CM | POA: Diagnosis present

## 2018-12-26 DIAGNOSIS — Z87891 Personal history of nicotine dependence: Secondary | ICD-10-CM

## 2018-12-26 DIAGNOSIS — E119 Type 2 diabetes mellitus without complications: Secondary | ICD-10-CM | POA: Diagnosis not present

## 2018-12-26 DIAGNOSIS — U071 COVID-19: Secondary | ICD-10-CM | POA: Diagnosis present

## 2018-12-26 DIAGNOSIS — Z515 Encounter for palliative care: Secondary | ICD-10-CM | POA: Diagnosis not present

## 2018-12-26 DIAGNOSIS — J1289 Other viral pneumonia: Secondary | ICD-10-CM | POA: Diagnosis present

## 2018-12-26 DIAGNOSIS — Z96612 Presence of left artificial shoulder joint: Secondary | ICD-10-CM | POA: Diagnosis present

## 2018-12-26 DIAGNOSIS — G894 Chronic pain syndrome: Secondary | ICD-10-CM | POA: Diagnosis present

## 2018-12-26 DIAGNOSIS — Z923 Personal history of irradiation: Secondary | ICD-10-CM

## 2018-12-26 DIAGNOSIS — I5032 Chronic diastolic (congestive) heart failure: Secondary | ICD-10-CM | POA: Diagnosis not present

## 2018-12-26 DIAGNOSIS — R0781 Pleurodynia: Secondary | ICD-10-CM

## 2018-12-26 DIAGNOSIS — J449 Chronic obstructive pulmonary disease, unspecified: Secondary | ICD-10-CM | POA: Diagnosis present

## 2018-12-26 DIAGNOSIS — I5033 Acute on chronic diastolic (congestive) heart failure: Secondary | ICD-10-CM | POA: Diagnosis not present

## 2018-12-26 DIAGNOSIS — E785 Hyperlipidemia, unspecified: Secondary | ICD-10-CM | POA: Diagnosis not present

## 2018-12-26 DIAGNOSIS — R0602 Shortness of breath: Secondary | ICD-10-CM | POA: Diagnosis not present

## 2018-12-26 DIAGNOSIS — Z9012 Acquired absence of left breast and nipple: Secondary | ICD-10-CM | POA: Diagnosis not present

## 2018-12-26 DIAGNOSIS — J1282 Pneumonia due to coronavirus disease 2019: Secondary | ICD-10-CM | POA: Diagnosis present

## 2018-12-26 DIAGNOSIS — Z9221 Personal history of antineoplastic chemotherapy: Secondary | ICD-10-CM

## 2018-12-26 DIAGNOSIS — N179 Acute kidney failure, unspecified: Secondary | ICD-10-CM | POA: Diagnosis present

## 2018-12-26 DIAGNOSIS — H3530 Unspecified macular degeneration: Secondary | ICD-10-CM | POA: Diagnosis present

## 2018-12-26 DIAGNOSIS — H409 Unspecified glaucoma: Secondary | ICD-10-CM | POA: Diagnosis present

## 2018-12-26 DIAGNOSIS — Z888 Allergy status to other drugs, medicaments and biological substances status: Secondary | ICD-10-CM

## 2018-12-26 DIAGNOSIS — J9602 Acute respiratory failure with hypercapnia: Secondary | ICD-10-CM | POA: Diagnosis present

## 2018-12-26 DIAGNOSIS — Z7951 Long term (current) use of inhaled steroids: Secondary | ICD-10-CM

## 2018-12-26 DIAGNOSIS — Z7189 Other specified counseling: Secondary | ICD-10-CM | POA: Diagnosis not present

## 2018-12-26 DIAGNOSIS — J80 Acute respiratory distress syndrome: Secondary | ICD-10-CM | POA: Diagnosis not present

## 2018-12-26 DIAGNOSIS — T884XXA Failed or difficult intubation, initial encounter: Secondary | ICD-10-CM

## 2018-12-26 DIAGNOSIS — R439 Unspecified disturbances of smell and taste: Secondary | ICD-10-CM | POA: Diagnosis not present

## 2018-12-26 DIAGNOSIS — R079 Chest pain, unspecified: Secondary | ICD-10-CM | POA: Diagnosis not present

## 2018-12-26 DIAGNOSIS — E7800 Pure hypercholesterolemia, unspecified: Secondary | ICD-10-CM | POA: Diagnosis not present

## 2018-12-26 DIAGNOSIS — H4050X2 Glaucoma secondary to other eye disorders, unspecified eye, moderate stage: Secondary | ICD-10-CM | POA: Diagnosis not present

## 2018-12-26 DIAGNOSIS — R339 Retention of urine, unspecified: Secondary | ICD-10-CM | POA: Diagnosis not present

## 2018-12-26 DIAGNOSIS — E118 Type 2 diabetes mellitus with unspecified complications: Secondary | ICD-10-CM | POA: Diagnosis not present

## 2018-12-26 DIAGNOSIS — E876 Hypokalemia: Secondary | ICD-10-CM | POA: Diagnosis not present

## 2018-12-26 DIAGNOSIS — C50512 Malignant neoplasm of lower-outer quadrant of left female breast: Secondary | ICD-10-CM | POA: Diagnosis not present

## 2018-12-26 LAB — TROPONIN I (HIGH SENSITIVITY): Troponin I (High Sensitivity): 10 ng/L (ref <18)

## 2018-12-26 LAB — CBC WITH DIFFERENTIAL/PLATELET
Abs Immature Granulocytes: 0.21 10*3/uL — ABNORMAL HIGH (ref 0.00–0.07)
Basophils Absolute: 0 10*3/uL (ref 0.0–0.1)
Basophils Relative: 0 %
Eosinophils Absolute: 0 10*3/uL (ref 0.0–0.5)
Eosinophils Relative: 0 %
HCT: 40.9 % (ref 36.0–46.0)
Hemoglobin: 13.5 g/dL (ref 12.0–15.0)
Immature Granulocytes: 2 %
Lymphocytes Relative: 12 %
Lymphs Abs: 1.7 10*3/uL (ref 0.7–4.0)
MCH: 29.1 pg (ref 26.0–34.0)
MCHC: 33 g/dL (ref 30.0–36.0)
MCV: 88.1 fL (ref 80.0–100.0)
Monocytes Absolute: 0.7 10*3/uL (ref 0.1–1.0)
Monocytes Relative: 5 %
Neutro Abs: 11.4 10*3/uL — ABNORMAL HIGH (ref 1.7–7.7)
Neutrophils Relative %: 81 %
Platelets: 293 10*3/uL (ref 150–400)
RBC: 4.64 MIL/uL (ref 3.87–5.11)
RDW: 15.2 % (ref 11.5–15.5)
WBC: 14 10*3/uL — ABNORMAL HIGH (ref 4.0–10.5)
nRBC: 0 % (ref 0.0–0.2)

## 2018-12-26 LAB — COMPREHENSIVE METABOLIC PANEL
ALT: 21 U/L (ref 0–44)
AST: 29 U/L (ref 15–41)
Albumin: 3.2 g/dL — ABNORMAL LOW (ref 3.5–5.0)
Alkaline Phosphatase: 66 U/L (ref 38–126)
Anion gap: 12 (ref 5–15)
BUN: 33 mg/dL — ABNORMAL HIGH (ref 8–23)
CO2: 25 mmol/L (ref 22–32)
Calcium: 8.8 mg/dL — ABNORMAL LOW (ref 8.9–10.3)
Chloride: 103 mmol/L (ref 98–111)
Creatinine, Ser: 0.82 mg/dL (ref 0.44–1.00)
GFR calc Af Amer: >60 mL/min (ref >60)
GFR calc non Af Amer: >60 mL/min (ref >60)
Glucose, Bld: 119 mg/dL — ABNORMAL HIGH (ref 70–99)
Potassium: 4.4 mmol/L (ref 3.5–5.1)
Sodium: 140 mmol/L (ref 135–145)
Total Bilirubin: 0.6 mg/dL (ref 0.3–1.2)
Total Protein: 7 g/dL (ref 6.5–8.1)

## 2018-12-26 LAB — LACTIC ACID, PLASMA
Lactic Acid, Venous: 2.3 mmol/L (ref 0.5–1.9)
Lactic Acid, Venous: 1.1 mmol/L (ref 0.5–1.9)

## 2018-12-26 LAB — FIBRINOGEN: Fibrinogen: >750 mg/dL — ABNORMAL HIGH (ref 210–475)

## 2018-12-26 LAB — C-REACTIVE PROTEIN: CRP: 8.8 mg/dL — ABNORMAL HIGH (ref <1.0)

## 2018-12-26 LAB — FERRITIN: Ferritin: 416 ng/mL — ABNORMAL HIGH (ref 11–307)

## 2018-12-26 LAB — PROCALCITONIN: Procalcitonin: <0.1 ng/mL

## 2018-12-26 LAB — FIBRIN DERIVATIVES D-DIMER (ARMC ONLY): Fibrin derivatives D-dimer (ARMC): 600 ng/mL (FEU) — ABNORMAL HIGH (ref 0.00–499.00)

## 2018-12-26 LAB — TRIGLYCERIDES: Triglycerides: 170 mg/dL — ABNORMAL HIGH (ref <150)

## 2018-12-26 LAB — LACTATE DEHYDROGENASE: LDH: 449 U/L — ABNORMAL HIGH (ref 98–192)

## 2018-12-26 LAB — ABO/RH: ABO/RH(D): A POS

## 2018-12-26 MED ORDER — SODIUM CHLORIDE 0.9% FLUSH: 3.0000 mL | INTRAVENOUS | Status: DC | PRN

## 2018-12-26 MED ORDER — SODIUM CHLORIDE 0.9 % IV SOLN
100.0000 mg | INTRAVENOUS | Status: DC
  Filled 2018-12-26: qty 20

## 2018-12-26 MED ORDER — FUROSEMIDE 10 MG/ML IJ SOLN
80.0000 mg | Freq: Once | INTRAMUSCULAR | Status: AC
  Administered 2018-12-26: 80 mg via INTRAVENOUS
  Filled 2018-12-26: qty 8

## 2018-12-26 MED ORDER — CVS LEG CRAMPS PAIN RELIEF PO TABS: ORAL_TABLET | ORAL | Status: DC | PRN

## 2018-12-26 MED ORDER — ONDANSETRON HCL 4 MG/2ML IJ SOLN: 4.0000 mg | Freq: Four times a day (QID) | INTRAMUSCULAR | Status: DC | PRN

## 2018-12-26 MED ORDER — FUROSEMIDE 10 MG/ML IJ SOLN: 40.0000 mg | Freq: Two times a day (BID) | INTRAMUSCULAR | Status: DC

## 2018-12-26 MED ORDER — ACETAMINOPHEN 325 MG PO TABS
650.0000 mg | ORAL_TABLET | Freq: Four times a day (QID) | ORAL | Status: DC | PRN
  Administered 2018-12-27 – 2019-01-04 (×7): 650 mg via ORAL
  Filled 2018-12-26 (×7): qty 2

## 2018-12-26 MED ORDER — ADULT MULTIVITAMIN W/MINERALS CH
1.0000 | ORAL_TABLET | Freq: Every day | ORAL | Status: DC
  Administered 2018-12-26: 1 via ORAL
  Filled 2018-12-26: qty 1

## 2018-12-26 MED ORDER — LOSARTAN POTASSIUM 50 MG PO TABS
100.0000 mg | ORAL_TABLET | Freq: Every day | ORAL | Status: DC
  Administered 2018-12-26: 10:00:00 100 mg via ORAL
  Filled 2018-12-26: qty 2

## 2018-12-26 MED ORDER — AMLODIPINE BESYLATE 5 MG PO TABS
5.0000 mg | ORAL_TABLET | Freq: Every day | ORAL | Status: DC
  Administered 2018-12-26: 10:00:00 5 mg via ORAL
  Filled 2018-12-26: qty 1

## 2018-12-26 MED ORDER — METOPROLOL SUCCINATE ER 50 MG PO TB24
25.0000 mg | ORAL_TABLET | Freq: Every day | ORAL | Status: DC
  Administered 2018-12-26: 09:00:00 25 mg via ORAL
  Filled 2018-12-26: qty 1

## 2018-12-26 MED ORDER — LEVOTHYROXINE SODIUM 50 MCG PO TABS
25.0000 ug | ORAL_TABLET | Freq: Every day | ORAL | Status: DC
  Administered 2018-12-26: 09:00:00 25 ug via ORAL
  Filled 2018-12-26: qty 1

## 2018-12-26 MED ORDER — CO Q 10 10 MG PO CAPS: 10.0000 mg | ORAL_CAPSULE | Freq: Every day | ORAL | Status: DC

## 2018-12-26 MED ORDER — IOHEXOL 350 MG/ML SOLN
75.0000 mL | Freq: Once | INTRAVENOUS | Status: AC | PRN
  Administered 2018-12-26: 75 mL via INTRAVENOUS

## 2018-12-26 MED ORDER — ACETAMINOPHEN 325 MG PO TABS: 650.0000 mg | ORAL_TABLET | Freq: Four times a day (QID) | ORAL | Status: DC | PRN

## 2018-12-26 MED ORDER — ONDANSETRON HCL 4 MG/2ML IJ SOLN
4.0000 mg | Freq: Four times a day (QID) | INTRAMUSCULAR | Status: DC | PRN
  Administered 2018-12-30 – 2019-01-05 (×2): 4 mg via INTRAVENOUS
  Filled 2018-12-26 (×2): qty 2

## 2018-12-26 MED ORDER — HYDROCODONE-ACETAMINOPHEN 5-325 MG PO TABS: 1.0000 | ORAL_TABLET | ORAL | Status: DC | PRN

## 2018-12-26 MED ORDER — ASPIRIN EC 325 MG PO TBEC
325.0000 mg | DELAYED_RELEASE_TABLET | Freq: Every day | ORAL | Status: DC
  Administered 2018-12-26: 325 mg via ORAL
  Filled 2018-12-26: qty 1

## 2018-12-26 MED ORDER — ONDANSETRON HCL 4 MG PO TABS: 4.0000 mg | ORAL_TABLET | Freq: Four times a day (QID) | ORAL | Status: DC | PRN

## 2018-12-26 MED ORDER — FUROSEMIDE 10 MG/ML IJ SOLN: 60.0000 mg | Freq: Two times a day (BID) | INTRAMUSCULAR | 0 refills | Status: AC

## 2018-12-26 MED ORDER — METHYLPREDNISOLONE SODIUM SUCC 125 MG IJ SOLR: 60.0000 mg | Freq: Four times a day (QID) | INTRAMUSCULAR | 0 refills | Status: AC

## 2018-12-26 MED ORDER — IPRATROPIUM-ALBUTEROL 20-100 MCG/ACT IN AERS: 1.0000 | INHALATION_SPRAY | Freq: Four times a day (QID) | RESPIRATORY_TRACT | Status: AC

## 2018-12-26 MED ORDER — PANTOPRAZOLE SODIUM 40 MG PO TBEC
40.0000 mg | DELAYED_RELEASE_TABLET | Freq: Every day | ORAL | Status: DC
  Administered 2018-12-26: 40 mg via ORAL
  Filled 2018-12-26: qty 1

## 2018-12-26 MED ORDER — VITAMIN C 500 MG PO TABS
500.0000 mg | ORAL_TABLET | Freq: Every day | ORAL | Status: DC
  Administered 2018-12-27 – 2019-01-04 (×9): 500 mg via ORAL
  Filled 2018-12-26 (×9): qty 1

## 2018-12-26 MED ORDER — ZINC SULFATE 220 (50 ZN) MG PO CAPS
220.0000 mg | ORAL_CAPSULE | Freq: Every day | ORAL | Status: DC
  Administered 2018-12-26: 220 mg via ORAL
  Filled 2018-12-26: qty 1

## 2018-12-26 MED ORDER — METHYLPREDNISOLONE SODIUM SUCC 40 MG IJ SOLR
0.2500 mg/kg | Freq: Two times a day (BID) | INTRAMUSCULAR | Status: DC
  Administered 2018-12-26: 20 mg via INTRAVENOUS
  Filled 2018-12-26: qty 1

## 2018-12-26 MED ORDER — FLUTICASONE PROPIONATE 50 MCG/ACT NA SUSP
2.0000 | Freq: Every day | NASAL | Status: DC
  Filled 2018-12-26: qty 16

## 2018-12-26 MED ORDER — IPRATROPIUM-ALBUTEROL 20-100 MCG/ACT IN AERS
1.0000 | INHALATION_SPRAY | Freq: Four times a day (QID) | RESPIRATORY_TRACT | Status: DC
  Administered 2018-12-26: 19:00:00 1 via RESPIRATORY_TRACT
  Filled 2018-12-26: qty 4

## 2018-12-26 MED ORDER — ENOXAPARIN SODIUM 40 MG/0.4ML ~~LOC~~ SOLN
40.0000 mg | SUBCUTANEOUS | Status: DC
  Administered 2018-12-26: 10:00:00 40 mg via SUBCUTANEOUS
  Filled 2018-12-26: qty 0.4

## 2018-12-26 MED ORDER — ZINC SULFATE 220 (50 ZN) MG PO CAPS
220.0000 mg | ORAL_CAPSULE | Freq: Every day | ORAL | Status: DC
  Administered 2018-12-27 – 2019-01-04 (×9): 220 mg via ORAL
  Filled 2018-12-26 (×9): qty 1

## 2018-12-26 MED ORDER — SODIUM CHLORIDE 0.9 % IV SOLN: 250.0000 mL | INTRAVENOUS | Status: DC | PRN

## 2018-12-26 MED ORDER — MAGNESIUM OXIDE 400 (241.3 MG) MG PO TABS
400.0000 mg | ORAL_TABLET | Freq: Every day | ORAL | Status: DC
  Administered 2018-12-26: 400 mg via ORAL
  Filled 2018-12-26: qty 1

## 2018-12-26 MED ORDER — MAGNESIUM HYDROXIDE 400 MG/5ML PO SUSP: 30.0000 mL | Freq: Every day | ORAL | Status: DC | PRN

## 2018-12-26 MED ORDER — TAMOXIFEN CITRATE 10 MG PO TABS
20.0000 mg | ORAL_TABLET | Freq: Every day | ORAL | Status: DC
  Administered 2018-12-26: 10:00:00 20 mg via ORAL
  Filled 2018-12-26: qty 2

## 2018-12-26 MED ORDER — ENOXAPARIN SODIUM 40 MG/0.4ML ~~LOC~~ SOLN
40.0000 mg | SUBCUTANEOUS | Status: DC
  Administered 2018-12-27: 40 mg via SUBCUTANEOUS
  Filled 2018-12-26: qty 0.4

## 2018-12-26 MED ORDER — SODIUM CHLORIDE 0.9 % IV SOLN: 200.0000 mg | Freq: Once | INTRAVENOUS | Status: DC

## 2018-12-26 MED ORDER — SODIUM CHLORIDE 0.9 % IV SOLN: 100.0000 mg | INTRAVENOUS | Status: DC

## 2018-12-26 MED ORDER — FAMOTIDINE 20 MG PO TABS: 20.0000 mg | ORAL_TABLET | Freq: Two times a day (BID) | ORAL | Status: DC | PRN

## 2018-12-26 MED ORDER — HYDROCOD POLST-CPM POLST ER 10-8 MG/5ML PO SUER
5.0000 mL | Freq: Two times a day (BID) | ORAL | Status: DC | PRN
  Administered 2019-01-04: 5 mL via ORAL
  Filled 2018-12-26: qty 5

## 2018-12-26 MED ORDER — SODIUM CHLORIDE 0.9 % IV SOLN
200.0000 mg | Freq: Once | INTRAVENOUS | Status: AC
  Administered 2018-12-26: 05:00:00 200 mg via INTRAVENOUS
  Filled 2018-12-26: qty 40

## 2018-12-26 MED ORDER — ALBUTEROL SULFATE HFA 108 (90 BASE) MCG/ACT IN AERS
2.0000 | INHALATION_SPRAY | RESPIRATORY_TRACT | Status: DC
  Administered 2018-12-26 (×2): 2 via RESPIRATORY_TRACT
  Filled 2018-12-26: qty 6.7

## 2018-12-26 MED ORDER — VITAMIN D 25 MCG (1000 UNIT) PO TABS
1000.0000 [IU] | ORAL_TABLET | Freq: Every day | ORAL | Status: DC
  Administered 2018-12-26: 09:00:00 1000 [IU] via ORAL
  Filled 2018-12-26: qty 1

## 2018-12-26 MED ORDER — DIAZEPAM 5 MG PO TABS: 5.0000 mg | ORAL_TABLET | Freq: Every day | ORAL | Status: DC

## 2018-12-26 MED ORDER — METHYLPREDNISOLONE SODIUM SUCC 125 MG IJ SOLR
125.0000 mg | Freq: Once | INTRAMUSCULAR | Status: AC
  Administered 2018-12-26: 03:00:00 125 mg via INTRAVENOUS
  Filled 2018-12-26: qty 2

## 2018-12-26 MED ORDER — FUROSEMIDE 10 MG/ML IJ SOLN
60.0000 mg | Freq: Two times a day (BID) | INTRAMUSCULAR | Status: DC
  Administered 2018-12-26: 18:00:00 60 mg via INTRAVENOUS
  Filled 2018-12-26: qty 8

## 2018-12-26 MED ORDER — SODIUM CHLORIDE 0.9% FLUSH
3.0000 mL | Freq: Two times a day (BID) | INTRAVENOUS | Status: DC
  Administered 2018-12-27 – 2019-01-04 (×16): 3 mL via INTRAVENOUS

## 2018-12-26 MED ORDER — METHYLPREDNISOLONE SODIUM SUCC 125 MG IJ SOLR
80.0000 mg | Freq: Two times a day (BID) | INTRAMUSCULAR | Status: DC
  Administered 2018-12-27 – 2018-12-28 (×3): 80 mg via INTRAVENOUS
  Filled 2018-12-26 (×3): qty 2

## 2018-12-26 MED ORDER — VITAMIN C 500 MG PO TABS
500.0000 mg | ORAL_TABLET | Freq: Every day | ORAL | Status: DC
  Administered 2018-12-26: 10:00:00 500 mg via ORAL
  Filled 2018-12-26: qty 1

## 2018-12-26 MED ORDER — METHYLPREDNISOLONE SODIUM SUCC 125 MG IJ SOLR
60.0000 mg | Freq: Four times a day (QID) | INTRAMUSCULAR | Status: DC
  Administered 2018-12-26: 60 mg via INTRAVENOUS
  Filled 2018-12-26: qty 2

## 2018-12-26 MED ORDER — GUAIFENESIN-DM 100-10 MG/5ML PO SYRP
10.0000 mL | ORAL_SOLUTION | ORAL | Status: DC | PRN
  Administered 2018-12-29: 09:00:00 10 mL via ORAL
  Filled 2018-12-26: qty 10

## 2018-12-26 MED ORDER — TRAZODONE HCL 50 MG PO TABS: 25.0000 mg | ORAL_TABLET | Freq: Every evening | ORAL | Status: DC | PRN

## 2018-12-26 MED ORDER — SODIUM CHLORIDE 0.9 % IV BOLUS
1000.0000 mL | Freq: Once | INTRAVENOUS | Status: AC
  Administered 2018-12-26: 1000 mL via INTRAVENOUS

## 2018-12-26 NOTE — ED Notes (Signed)
Care link here for pt now.  Pt alert.

## 2018-12-26 NOTE — Discharge Summary (Signed)
[facility] Discharge Summary   Patient: Kristin Estrada [id number]   PCP: Frazier Richards, MD DOB: Dec 20, 1940   Date of admission: 01/03/2019   Date of discharge:  01/07/2019    Discharge Diagnoses:  Active Problems:   Acute respiratory failure due to COVID-19 [facility])   Admitted From: home Disposition:  Outside Hospital [place]  Recommendations for Outpatient Follow-up:   Follow-up Information    Frazier Richards, MD. Schedule an appointment as soon as possible for a visit in 1 week(s).   Specialty: Family Medicine Contact information: [place] [address] [phone]          Diet recommendation: Cardiac diet  Activity: The patient is advised to gradually reintroduce usual activities,as tolerated.  Discharge Condition: stable  Code Status: Full code   History of present illness: As per the H and P dictated on admission, "Kristin Estrada is a 78 y.o. female with Past medical history of COPD, chronic back pain, fibromyalgia, GERD, arthritis, HTN. Patient presents with complaints of 1 week of shortness of breath and cough. She denies any nausea or vomiting. Patient was seen by PCP a few days ago and was started on Levaquin as well as steroids. [facility] where she was tested for COVID-19 on 12/23/2018 which came back positive.  (Results are in [place]). Patient continues to have progressive shortness of breath and was unable to perform any activity at home and therefore decided to come to the hospital. She also had a fever of 102 Fahrenheit yesterday. She reports loss of smell and taste sensation. No smoking no drug abuse no alcohol abuse. Does not use any oxygen at her baseline."  Hospital Course:  Summary of her active problems in the hospital is as following. 1.  Acute hypoxic respiratory failure Acute COVID-19 Viral illness Acute on chronic HFpEF Recent Labs  No results found for: SARSCOV2NAA   CXR:  hazy bilateral peripheral opacities  Recent Labs (last 2 labs)      Recent Labs    12/21/2018 0300  FERRITIN 416*  LDH 449*  CRP 8.8*      Tmax last 24 hours:  Temp (24hrs), Avg:99.4 F (37.4 C), Min:99.4 F (37.4 C), Max:99.4 F (37.4 C)   Oxygen requirements: Initially was on BiPAP.  Currently on 100% NRB  Antibiotics: None-procalcitonin level negative Diuretics: IV Lasix 80 mg x 1, 60 mg twice daily. Vitamin C and Zinc: Initiated on 01/01/2019 DVT Prophylaxis: Subcutaneous Lovenox weight based. Remdesivir: Initiated on 01/04/2019 Steroids: IV Solu-Medrol 60 mg every 12 hours Actemra: Has not received off-label use of Actemra Discussed about possible use for plasma and patient tells me that to what ever it takes if you think it is going to help me to get better from Covid.  Prone positioning: Patient encouraged to stay in prone position as much as possible.  PPE During this encounter: Patient Isolation: Airborne + Droplet + Contact HCP PPE: CAPR, gown. gloves Patient PPE: None  The treatment plan and use of medications and known side effects were discussed with patient/family. It was clearly explained that there is no proven definitive treatment for COVID-19 infection yet. Any medications used here are based on case reports/anecdotal data which are not peer-reviewed and has not been studied using randomized control trials.  Complete risks and long-term side effects are unknown, however in the best clinical judgment they seem to be of some clinical benefit rather than medical risks.  Patient/family agree  with the treatment plan and want to receive these treatments as indicated.  2.  Essential hypertension Continuing Norvasc.  3.  Hypothyroidism. Continuing Synthroid.  4.  GERD. Continuing PPI.  5.  History of breast cancer. Pulmonary nodules. Liver nodule. Patient does have history of left-sided breast cancer SP mastectomy. Mammogram in June 2020 was  negative. CT scan shows evidence of bilateral pulmonary nodule, subcarinal adenopathy as well as 2 area of low-density in the left lobe of the liver concerning for possible metastasis. Recommend outpatient PET scan for further work-up. Have not discussed this with the patient so far.  Patient was ambulatory without any assistance. On the day of the discharge the patient's vitals were stable, and no other acute medical condition were reported by patient. the patient was felt safe to be discharge at [place]: none Procedures: none  DISCHARGE MEDICATION: Allergies as of 12/25/2018      Reactions   Protonix [pantoprazole Sodium] Other (See Comments)   abd cramps   Prednisone Other (See Comments)   Agitation, insomina, mood change. Tolerates injection- not PO Agitation, insomina, mood change. Tolerates injection- not PO   Statins Other (See Comments)   Legs hurt Legs hurt Legs hurt      Medication List    TAKE these medications   acetaminophen 500 MG tablet Commonly known as: TYLENOL Take 500-1,000 mg by mouth every 6 (six) hours as needed for moderate pain or headache.   albuterol 108 (90 Base) MCG/ACT inhaler Commonly known as: ProAir HFA Inhale 2 puffs into the lungs every 4 (four) hours as needed. What changed: reasons to take this   amLODipine 5 MG tablet Commonly known as: NORVASC Take 5 mg by mouth daily.   Avastin 100 MG/4ML Soln Generic drug: Bevacizumab Place 1 Dose into both eyes every 30 (thirty) days.   cetirizine 10 MG tablet Commonly known as: ZYRTEC Take 10 mg by mouth at bedtime.   cholecalciferol 1000 units tablet Commonly known as: VITAMIN D Take 1,000 Units by mouth daily.   clotrimazole 1 % cream Commonly known as: LOTRIMIN Apply 1 application topically 2 (two) times daily as needed (for yeast infection).   Co Q 10 10 MG Caps Take 10 mg by mouth daily.   [facility] LEG CRAMPS PAIN RELIEF PO Take 2 tablets by mouth every 4  (four) hours as needed (for leg cramps).   diazepam 5 MG tablet Commonly known as: Valium Take 0.5-1 tablets (2.5-5 mg total) by mouth every 6 (six) hours as needed for muscle spasms or sedation. What changed:   how much to take  when to take this   diclofenac 75 MG EC tablet Commonly known as: VOLTAREN Take 75 mg by mouth 2 (two) times daily.   famotidine 20 MG tablet Commonly known as: PEPCID Take 1 tablet (20 mg total) by mouth 2 (two) times daily. When coughing   furosemide 10 MG/ML injection Commonly known as: LASIX Inject 6 mLs (60 mg total) into the vein 2 (two) times daily.   Ipratropium-Albuterol 20-100 MCG/ACT Aers respimat Commonly known as: COMBIVENT Inhale 1 puff into the lungs every 6 (six) hours.   levofloxacin 500 MG tablet Commonly known as: LEVAQUIN Take 500 mg by mouth daily.   levothyroxine 50 MCG tablet Commonly known as: SYNTHROID Take 50 mcg by mouth daily before breakfast.   losartan 100 MG tablet Commonly known as: COZAAR Take 100 mg by mouth daily.   Magnesium 400 MG Caps Take 400 mg by  mouth See admin instructions. Take 400 mg by mouth in the morning every day and take 400 mg by mouth 3 times weekly at bedtime   methylPREDNISolone sodium succinate 125 mg/2 mL injection Commonly known as: SOLU-MEDROL Inject 0.96 mLs (60 mg total) into the vein every 6 (six) hours.   metoprolol succinate 25 MG 24 hr tablet Commonly known as: TOPROL-XL Take 25 mg by mouth daily.   multivitamin with minerals Tabs tablet Take 1 tablet by mouth daily.   nystatin 100000 UNIT/ML suspension Commonly known as: MYCOSTATIN Take 5 mLs (500,000 Units total) by mouth as needed. What changed:   when to take this  reasons to take this   predniSONE 20 MG tablet Commonly known as: DELTASONE Take 10-60 mg by mouth See admin instructions. Take 3 tablets (60mg ) daily for 2 days, 2 tablets (40mg ) by mouth daily for 2 days, 1 tablet (20mg ) by mouth daily for 2 days  then take  tablet (10mg ) by mouth daily for 4 days   SYSTANE OP Place 1 drop into both eyes 3 (three) times daily as needed (for dry eyes (at least once daily)).   tamoxifen 20 MG tablet Commonly known as: NOLVADEX Take 1 tablet (20 mg total) by mouth daily.   vitamin C 500 MG tablet Commonly known as: ASCORBIC ACID Take 500 mg by mouth daily.      Allergies  Allergen Reactions  . Protonix [Pantoprazole Sodium] Other (See Comments)    abd cramps  . Prednisone Other (See Comments)    Agitation, insomina, mood change. Tolerates injection- not PO Agitation, insomina, mood change. Tolerates injection- not PO  . Statins Other (See Comments)    Legs hurt Legs hurt Legs hurt   Discharge Instructions    Diet - low sodium heart healthy   Complete by: As directed    Increase activity slowly   Complete by: As directed      Discharge Exam: Filed Weights   01/08/2019 0630  Weight: 79.4 kg   Vitals:   01/01/2019 1415 12/25/2018 1430  BP: (!) 145/64 (!) 145/69  Pulse: 79 83  Resp: (!) 28 (!) 28  Temp:    SpO2: 90% (!) 89%   The results of significant diagnostics from this hospitalization (including imaging, microbiology, ancillary and laboratory) are listed below for reference.    Significant Diagnostic Studies: Ct Angio Chest Pe W Or Wo Contrast  Result Date: 12/18/2018 CLINICAL DATA:  Cough and shortness of breath for the past week. Fever as high as 102. Chest tightness. Loss of smell and taste. Status post left mastectomy for breast cancer in 2019. EXAM: CT ANGIOGRAPHY CHEST WITH CONTRAST TECHNIQUE: Multidetector CT imaging of the chest was performed using the standard protocol during bolus administration of intravenous contrast. Multiplanar CT image reconstructions and MIPs were obtained to evaluate the vascular anatomy. CONTRAST:  7mL OMNIPAQUE IOHEXOL 350 MG/ML SOLN COMPARISON:  Portable chest obtained earlier today. FINDINGS: Cardiovascular: Enlarged heart. Normally opacified  pulmonary arteries with no pulmonary arterial filling defects seen. Mediastinum/Nodes: Mildly enlarged subcarinal nodes measuring up to 15 mm in short axis diameter on image number 44 series 4. The hilar and remainder of the mediastinal nodes are normal in size. No axillary adenopathy seen. Normal appearing thyroid gland. No visible esophageal abnormality. Lungs/Pleura: Marked prominence of the interstitial markings throughout the majority of both lungs with associated diffuse airspace opacities in the lower lung zones. No pleural fluid seen. 4 mm average diameter nodule in the anterior aspect of the left  lower lobe on image number 54 series 6. There is also a 9 x 8 mm nodule in the right middle lobe on image number 62 series 6, a 7 x 6 mm nodule in the right lower lobe on image number 60 of series 6, an 8 x 7 mm nodule in the right upper lobe on image number 35 of series 6 and multiple additional smaller bilateral lung nodules. Mild bilateral bullous changes in a centrilobular pattern. Upper Abdomen: Small hypervascular area in the superior aspect of the spleen compatible with a flash filling hemangioma and 2 small oval areas of low density in the lateral segment of the left lobe of the liver. Mild diffuse low density of the liver relative to the spleen. Musculoskeletal: Left shoulder prosthesis. Thoracic and lower cervical spine degenerative changes. Review of the MIP images confirms the above findings. IMPRESSION: 1. No pulmonary emboli. 2. Marked bilateral interstitial lung disease with associated alveolar opacities, concerning for bilateral pneumonia and pneumonitis based on the patient's symptoms. Interstitial pulmonary edema is less likely in the absence pleural fluid. 3. Multiple bilateral lung nodules, as described above. These are suspicious for metastases given the recent history of breast cancer. 4. Subcarinal adenopathy. This is most likely reactive. Metastatic adenopathy is also a possibility. 5. Two  small oval areas of low density in the lateral segment of the left lobe of the liver. These could represent cysts, meningiomas or metastases. 6. Cardiomegaly. 7. Mild changes of COPD with centrilobular emphysema. 8. Mild diffuse hepatic steatosis. Emphysema (ICD10-J43.9). Electronically Signed   By: Claudie Revering M.D.   On: 12/20/2018 12:01   Dg Chest Portable 1 View  Result Date: 01/11/2019 CLINICAL DATA:  Shortness of breath and cough EXAM: PORTABLE CHEST 1 VIEW COMPARISON:  03/01/2015 FINDINGS: Cardiomegaly with moderate interstitial opacity. No sizable pleural effusion or pneumothorax. IMPRESSION: Cardiomegaly and moderate interstitial opacities favored to indicate pulmonary edema. Electronically Signed   By: Ulyses Jarred M.D.   On: 01/14/2019 03:14    Microbiology: Recent Results (from the past 240 hour(s))  Blood Culture (routine x 2)     Status: None (Preliminary result)   Collection Time: 12/25/2018  2:38 AM   Specimen: BLOOD  Result Value Ref Range Status   Specimen Description BLOOD LEFT FOREARM  Final   Special Requests   Final    BOTTLES DRAWN AEROBIC AND ANAEROBIC Blood Culture adequate volume   Culture   Final    NO GROWTH < 12 HOURS Performed at [facility], [address] [place], [place] [address]    Report Status PENDING  Incomplete  Blood Culture (routine x 2)     Status: None (Preliminary result)   Collection Time: 12/22/2018  2:38 AM   Specimen: BLOOD  Result Value Ref Range Status   Specimen Description BLOOD RIGHT HAND  Final   Special Requests   Final    BOTTLES DRAWN AEROBIC AND ANAEROBIC Blood Culture adequate volume   Culture   Final    NO GROWTH < 12 HOURS Performed at [facility], [address], [place], [place] [address]    Report Status PENDING  Incomplete     Labs: CBC: Recent Labs  Lab 12/23/2018 0238  WBC 14.0*  NEUTROABS 11.4*  HGB 13.5  HCT 40.9  MCV 88.1  PLT 0000000   Basic Metabolic Panel: Recent Labs  Lab 01/02/2019  0300  NA 140  K 4.4  CL 103  CO2 25  GLUCOSE 119*  BUN 33*  CREATININE 0.82  CALCIUM 8.8*   Liver Function Tests: Recent Labs  Lab 01/14/2019 0300  AST 29  ALT 21  ALKPHOS 66  BILITOT 0.6  PROT 7.0  ALBUMIN 3.2*   No results for input(s): LIPASE, AMYLASE in the last 168 hours. No results for input(s): AMMONIA in the last 168 hours. Cardiac Enzymes: No results for input(s): CKTOTAL, CKMB, CKMBINDEX, TROPONINI in the last 168 hours. BNP (last 3 results) No results for input(s): BNP in the last 8760 hours. CBG: No results for input(s): GLUCAP in the last 168 hours.  Time spent: 35 minutes  Signed:  Berle Mull  [facility]  12/18/2018 3:08 PM

## 2018-12-26 NOTE — ED Notes (Signed)
Patient placed on bedpan.

## 2018-12-26 NOTE — ED Notes (Signed)
Son updated that pt is being transferred to [facility] .

## 2018-12-26 NOTE — ED Notes (Signed)
Pt's son contacted via phone for an update about pt.

## 2018-12-26 NOTE — ED Notes (Signed)
Pt urinated in bed, pt cleaned up and linens changed. Pure wick placed back in place. Pt given new blankets and pillow

## 2018-12-26 NOTE — ED Notes (Signed)
MD Posey Pronto reported trying patient on non-rebreather and patient sats remained too low. Posey Pronto, MD asked this RN to put back on bi-pap. Bi-pap placed back on patient at this time. Patient reports breathing is better

## 2018-12-26 NOTE — ED Notes (Signed)
Patient placed on nasal canula to eat and give medicines. Oxygen saturation dropped to 60s while on 6L O2 nasal canula. Patient placed back on bipap at this time after eating and meds taken

## 2018-12-26 NOTE — ED Notes (Signed)
Resumed care from [place] rn.  Pt alert and oriented. nsr on monitor with occ pvc's.  Pt has soreness in bilateral anterior ribs.  Iv in place.

## 2018-12-26 NOTE — ED Notes (Signed)
Pt otf for CT

## 2018-12-26 NOTE — ED Notes (Signed)
Report given to Amy RN.

## 2018-12-26 NOTE — ED Notes (Signed)
Report called to jackie rn Administrator, sports.

## 2018-12-26 NOTE — ED Notes (Signed)
Pt placed on BIPAP

## 2018-12-26 NOTE — ED Notes (Signed)
Pt alert   nsr on monitor.   

## 2018-12-26 NOTE — ED Notes (Addendum)
Patient placed on bedpan to urinate. Patient pulled up in bed and repositioned with Bill RN. Patient given sips of diet sprite.

## 2018-12-26 NOTE — ED Notes (Signed)
meds given.  Pt alert.  Pt on nonrebreather.  Iv in place.

## 2018-12-26 NOTE — Progress Notes (Signed)
PHARMACIST - PHYSICIAN ORDER COMMUNICATION  CONCERNING: P&T Medication Policy on Herbal Medications  DESCRIPTION:  This patient's order for:  [employer] Leg Cramps Pain Relief TABS and Co-Q10.  has been noted.  This product(s) is classified as an "herbal" or natural product. Due to a lack of definitive safety studies or FDA approval, nonstandard manufacturing practices, plus the potential risk of unknown drug-drug interactions while on inpatient medications, the Pharmacy and Therapeutics Committee does not permit the use of "herbal" or natural products of this type within [facility].   ACTION TAKEN: The pharmacy department is unable to verify this order at this time. Please reevaluate patient's clinical condition at discharge and address if the herbal or natural product(s) should be resumed at that time.  Pernell Dupre, PharmD, BCPS Clinical Pharmacist 01/01/2019 8:52 AM

## 2018-12-26 NOTE — ED Notes (Signed)
Pt placed on 15L NRB, O2 saturation 91%, pt tolerating well

## 2018-12-26 NOTE — Progress Notes (Signed)
Pharmacy Note - Remdesivir Dosing  O:  ALT: 21 CXR:  Cardiomegaly and moderate interstitial opacities favored to indicate pulmonary edema  Requiring supplemental O2:   100 on NRB   A/P:  Patient meets criteria for remdesivir.  Patient received remdesivir 200 mg IV x 1 on 12/21/2018  Start remdesivir  100 mg IV daily x 4 days  On 12-27-18 at 1000 Monitor ALT, clinical progress  Despina Pole, Pharm. D. Clinical Pharmacist 01/12/2019 10:56 PM

## 2018-12-26 NOTE — ED Provider Notes (Signed)
[facility] Emergency Department Provider Note  ____________________________________________  Time seen: Approximately 2:46 AM  I have reviewed the triage vital signs and the nursing notes.   HISTORY  Chief Complaint Shortness of Breath   HPI Kristin Estrada is a 78 y.o. female with a history of chronic bronchitis and COPD, diabetes, fibromyalgia, breast cancer on remission who presents for evaluation of shortness of breath.  Patient reports that her symptoms started a week ago with shortness of breath and cough.  She reports having a telemedicine evaluation and was started on Levaquin and steroids.  Yesterday she started to have a fever as high as 102F.  Today the shortness of breath became worse which prompted her son to bring her to the emergency room for evaluation.  She is complaining of chest tightness but no chest pain, no vomiting or diarrhea, no sore throat.  She does have body aches which she attributed to her fibromyalgia.  She also reports loss of smell and taste.  She was tested for Covid 2 days ago but has not received the results yet.   Past Medical History:  Diagnosis Date   Arthritis    Bronchitis    Bruises easily    Cancer ([place])    breast   Cataracts, bilateral    Chronic airway obstruction, not elsewhere classified    Symbicort daily and Albuterol daily as needed   Chronic back pain    arthritis   Chronic bronchitis (HCC)    Cramps of left lower extremity    Diabetes mellitus without complication (HCC)    borderline-diet and exercise;takes Cinnamon daily   Esophageal reflux    takes Nexium daily and Dexilant daily as needed   Fibromyalgia    Fibromyalgia    Headache    occasionally   HOH (hard of hearing)    Hyperlipidemia    takes CO Q10 daily   Hypothyroidism    Insomnia    takes Ambien and Trazodone nightly   Joint pain    Joint swelling    Macular degeneration, right eye    wet and to get  injection tomorrow   Pneumonia    hx of x 3 with most recent one 2009   Seasonal allergies    takes Zyrtec daily as well as using Nasonex   Shortness of breath dyspnea    Shoulder fracture    Unspecified essential hypertension    takes Amlodipine and Losartan daily   URI (upper respiratory infection)    was on Prednisone and Antibiotic-to complete today   Urinary urgency     Patient Active Problem List   Diagnosis Date Noted   Breast cancer ([place]) 08/18/2017   Primary cancer of lower outer quadrant of left female breast ([place]) 07/24/2017   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/2014   HYPERTENSION 02/23/2007   Obstructive chronic bronchitis without exacerbation Gold Stage B 11/02/2006   GERD 11/02/2006    Past Surgical History:  Procedure Laterality Date   BREAST BIOPSY     BREAST SURGERY  1972   nodules removed and benign   CATARACT EXTRACTION W/PHACO Left 12/31/2014   Procedure: CATARACT EXTRACTION PHACO AND INTRAOCULAR LENS PLACEMENT ([place]);  Surgeon: Estill Cotta, MD;  Location: [facility] ORS;  Service: Ophthalmology;  Laterality: Left;  [place] 01:00 AP% 24.0 CDE 25.53 fluid pack # [id number]   CATARACT EXTRACTION W/PHACO Right 04/27/2017   Procedure: CATARACT EXTRACTION PHACO AND INTRAOCULAR LENS PLACEMENT ([facility]);  Surgeon: Birder Robson, MD;  Location: [facility] OR[facility];  Service: Ophthalmology;  Laterality: Right;  [place] 00:46.3 AP% 12.7 CDE 5.86 Fluid Pack lot # [id number]   DEBRIDEMENT AND CLOSURE WOUND Left 09/10/2017   Procedure: CLOSURE OF LEFT BREAST MASTECTOMY/WOUND;  Surgeon: Wallace Going, DO;  Location: [facility] ORS;  Service: Plastics;  Laterality: Left;   JOINT REPLACEMENT     SHOULDER left   MASTECTOMY Left 07/21/2017   mastectomy re exc for + margins 09/10/17 no rad no chemo no anti hormones   MASTECTOMY W/ SENTINEL NODE BIOPSY Left 08/18/2017   Procedure: MASTECTOMY WITH SENTINEL LYMPH NODE BIOPSY;  Surgeon: [name], MD;  Location: [facility] OR[facility];   Service: General;  Laterality: Left;   RE-EXCISION OF BREAST LUMPECTOMY Left 09/10/2017   Procedure: RE-EXCISION OF BREAST LUMPECTOMY;  Surgeon: [name], MD;  Location: [facility] OR[facility];  Service: General;  Laterality: Left;   REVERSE SHOULDER ARTHROPLASTY Left 04/19/2014   Procedure:  REVERSE TOTAL SHOULDER ARTHROPLASTY;  Surgeon: Marin Shutter, MD;  Location: [place];  Service: Orthopedics;  Laterality: Left;    Prior to Admission medications   Medication Sig Start Date End Date Taking? Authorizing Provider  acetaminophen (TYLENOL) 500 MG tablet Take 500 mg by mouth every 6 (six) hours as needed for moderate pain or headache.     [provider]  albuterol (PROAIR HFA) 108 (90 BASE) MCG/ACT inhaler Inhale 2 puffs into the lungs every 4 (four) hours as needed. Patient taking differently: Inhale 2 puffs into the lungs every 4 (four) hours as needed for wheezing or shortness of breath.  12/26/13   [name], MD  albuterol (PROVENTIL) (2.5 MG/3ML) 0.083% nebulizer solution Take 2.5 mg by nebulization 2 (two) times daily as needed for wheezing or shortness of breath.  05/11/12   [name], MD  amLODipine (NORVASC) 5 MG tablet Take 5 mg by mouth daily.    [provider]  Bevacizumab (AVASTIN IV) Place 1 Dose into both eyes every 30 (thirty) days.     [provider]  cetirizine (ZYRTEC) 10 MG tablet Take 10 mg by mouth at bedtime.  03/24/11   [name], MD  cholecalciferol (VITAMIN D) 1000 UNITS tablet Take 1,000 Units by mouth daily.    [provider]  clotrimazole (LOTRIMIN) 1 % cream Apply 1 application topically 2 (two) times daily as needed (for yeast infection).    [provider]  Coenzyme Q10 (CO Q 10) 10 MG CAPS Take 10 mg by mouth daily.     [provider]  diazepam (VALIUM) 5 MG tablet Take 0.5-1 tablets (2.5-5 mg total) by mouth every 6 (six) hours as needed for muscle spasms or sedation. Patient taking  differently: Take 5 mg by mouth at bedtime.  04/20/14   Shuford, Olivia Mackie, PA-C  diclofenac (VOLTAREN) 75 MG EC tablet Take 75 mg by mouth 2 (two) times daily.      [provider]  esomeprazole (NEXIUM) 40 MG capsule Take 40 mg by mouth daily before breakfast.     [provider]  famotidine (PEPCID) 20 MG tablet Take 1 tablet (20 mg total) by mouth 2 (two) times daily. When coughing Patient taking differently: Take 20 mg by mouth 2 (two) times daily as needed (for coughing).  01/29/14   [name], MD  Homeopathic Products ([employer] LEG CRAMPS PAIN RELIEF PO) Take 2 tablets by mouth every 4 (four) hours as needed (for leg cramps).     [provider]  HYDROcodone-acetaminophen (NORCO/VICODIN) 5-325 MG tablet Take  1 tablet by mouth every 4 (four) hours as needed for moderate pain. 08/19/17   [name], MD  levothyroxine (SYNTHROID, LEVOTHROID) 25 MCG tablet Take 25 mcg by mouth daily before breakfast.    [provider]  losartan (COZAAR) 100 MG tablet Take 100 mg by mouth daily.     [provider]  Magnesium 400 MG CAPS Take 400 mg by mouth See admin instructions. Take 400 mg by mouth in the morning every day and take 400 mg by mouth 3 times weekly at bedtime    [provider]  metoprolol succinate (TOPROL-XL) 25 MG 24 hr tablet Take 25 mg by mouth daily.    [provider]  mometasone (NASONEX) 50 MCG/ACT nasal spray Place 1 spray into the nose daily. 10/15/14   [name], MD  Multiple Vitamin (MULTIVITAMIN WITH MINERALS) TABS tablet Take 1 tablet by mouth daily.    [provider]  nystatin (MYCOSTATIN) 100000 UNIT/ML suspension Take 5 mLs (500,000 Units total) by mouth as needed. Patient taking differently: Take 5 mLs by mouth daily as needed (for mouth due to inhaler).  03/27/16   Wilhelmina Mcardle, MD  Polyethyl Glycol-Propyl Glycol (SYSTANE OP) Place 1 drop into both eyes 3 (three) times daily as needed (for  dry eyes (at least once daily)).     [provider]  SYMBICORT 160-4.5 MCG/ACT inhaler Inhale 2 puffs into the lungs 2 (two) times daily. 01/04/18   Wilhelmina Mcardle, MD  tamoxifen (NOLVADEX) 20 MG tablet Take 1 tablet (20 mg total) by mouth daily. 03/04/18   Lloyd Huger, MD  vitamin C (ASCORBIC ACID) 500 MG tablet Take 500 mg by mouth daily.    [provider]  fluticasone (VERAMYST) 27.5 MCG/SPRAY nasal spray Place 2 sprays into the nose daily as needed. 12/16/10 04/28/11  [name], MD  prochlorperazine (COMPAZINE) 10 MG tablet Take 1 tablet (10 mg total) by mouth every 6 (six) hours as needed (Nausea or vomiting). Patient not taking: Reported on 08/05/2017 07/30/17 08/08/17  Lloyd Huger, MD    Allergies Protonix [pantoprazole sodium], Prednisone, and Statins  Family History  Problem Relation Age of Onset   Heart disease Other    Breast cancer Neg Hx     Social History Social History   Tobacco Use   Smoking status: Former Smoker    Packs/day: 2.00    Years: 30.00    Pack years: 60.00    Types: Cigarettes    Quit date: 02/17/2004    Years since quitting: 14.8   Smokeless tobacco: Never Used   Tobacco comment: quit smoking in 2006  Substance Use Topics   Alcohol use: No    Alcohol/week: 0.0 standard drinks   Drug use: No    Review of Systems  Constitutional: Negative for fever. Eyes: Negative for visual changes. ENT: Negative for sore throat. Neck: No neck pain  Cardiovascular: Negative for chest pain. Respiratory: + shortness of breath, cough Gastrointestinal: Negative for abdominal pain, vomiting or diarrhea. Genitourinary: Negative for dysuria. Musculoskeletal: Negative for back pain. Skin: Negative for rash. Neurological: Negative for headaches, weakness or numbness. Psych: No SI or HI  ____________________________________________   PHYSICAL EXAM:  VITAL SIGNS: ED Triage Vitals [12/22/2018 0234]  Enc Vitals Group      BP      Pulse Rate 92     Resp (!) 28     Temp 99.4 F (37.4 C)     Temp Source Oral  SpO2 (!) 37 %     Weight      Height      Head Circumference      Peak Flow      Pain Score      Pain Loc      Pain Edu?      Excl. in [place]?     Constitutional: Alert and oriented.  HEENT:      Head: Normocephalic and atraumatic.         Eyes: Conjunctivae are normal. Sclera is non-icteric.       Mouth/Throat: Mucous membranes are moist.       Neck: Supple with no signs of meningismus. Cardiovascular: Regular rate and rhythm. No murmurs, gallops, or rubs. 2+ symmetrical distal pulses are present in all extremities. No JVD. Respiratory: Patient is tachypneic and satting 35% on room air, lungs are clear to auscultation with good air movement, no crackles or wheezes  gastrointestinal: Soft, non tender, and non distended with positive bowel sounds. No rebound or guarding. Musculoskeletal: Trace pitting edema bilaterally neurologic: Normal speech and language. Face is symmetric. Moving all extremities. No gross focal neurologic deficits are appreciated. Skin: Skin is warm, dry and intact. No rash noted. Psychiatric: Mood and affect are normal. Speech and behavior are normal.  ____________________________________________   LABS (all labs ordered are listed, but only abnormal results are displayed)  Labs Reviewed  BLOOD GAS, VENOUS - Abnormal; Notable for the following components:      Result Value   pH, Ven 7.45 (*)    pCO2, Ven 42 (*)    Bicarbonate 29.2 (*)    Acid-Base Excess 4.7 (*)    All other components within normal limits  CBC WITH DIFFERENTIAL/PLATELET - Abnormal; Notable for the following components:   WBC 14.0 (*)    Neutro Abs 11.4 (*)    Abs Immature Granulocytes 0.21 (*)    All other components within normal limits  LACTIC ACID, PLASMA - Abnormal; Notable for the following components:   Lactic Acid, Venous 2.3 (*)    All other components within normal limits    TRIGLYCERIDES - Abnormal; Notable for the following components:   Triglycerides 170 (*)    All other components within normal limits  COMPREHENSIVE METABOLIC PANEL - Abnormal; Notable for the following components:   Glucose, Bld 119 (*)    BUN 33 (*)    Calcium 8.8 (*)    Albumin 3.2 (*)    All other components within normal limits  FIBRIN DERIVATIVES D-DIMER ([facility] ONLY) - Abnormal; Notable for the following components:   Fibrin derivatives D-dimer ([facility]) 600.00 (*)    All other components within normal limits  FERRITIN - Abnormal; Notable for the following components:   Ferritin 416 (*)    All other components within normal limits  FIBRINOGEN - Abnormal; Notable for the following components:   Fibrinogen >750 (*)    All other components within normal limits  LACTATE DEHYDROGENASE - Abnormal; Notable for the following components:   LDH 449 (*)    All other components within normal limits  CULTURE, BLOOD (ROUTINE X 2)  CULTURE, BLOOD (ROUTINE X 2)  PROCALCITONIN  LACTIC ACID, PLASMA  C-REACTIVE PROTEIN  TROPONIN I (HIGH SENSITIVITY)   ____________________________________________  EKG  ED ECG REPORT I, Rudene Re, the attending physician, personally viewed and interpreted this ECG.  Normal sinus rhythm, rate of 91, normal intervals, normal axis, no ST elevations or depressions, occasional PVCs. ____________________________________________  RADIOLOGY  I have personally  reviewed the images performed during this visit and I agree with the Radiologist's read.   Interpretation by Radiologist:  Dg Chest Portable 1 View  Result Date: 01/12/2019 CLINICAL DATA:  Shortness of breath and cough EXAM: PORTABLE CHEST 1 VIEW COMPARISON:  03/01/2015 FINDINGS: Cardiomegaly with moderate interstitial opacity. No sizable pleural effusion or pneumothorax. IMPRESSION: Cardiomegaly and moderate interstitial opacities favored to indicate pulmonary edema. Electronically Signed   By:  Ulyses Jarred M.D.   On: 01/11/2019 03:14      ____________________________________________   PROCEDURES  Procedure(s) performed: None Procedures Critical Care performed: yes  CRITICAL CARE Performed by: Rudene Re  ?  Total critical care time: 40 min  Critical care time was exclusive of separately billable procedures and treating other patients.  Critical care was necessary to treat or prevent imminent or life-threatening deterioration.  Critical care was time spent personally by me on the following activities: development of treatment plan with patient and/or surrogate as well as nursing, discussions with consultants, evaluation of patient's response to treatment, examination of patient, obtaining history from patient or surrogate, ordering and performing treatments and interventions, ordering and review of laboratory studies, ordering and review of radiographic studies, pulse oximetry and re-evaluation of patient's condition.  ____________________________________________   INITIAL IMPRESSION / ASSESSMENT AND PLAN / ED COURSE  78 y.o. female with a history of chronic bronchitis and COPD, diabetes, fibromyalgia, breast cancer on remission who presents for evaluation of shortness of breath, cough, loss of taste and smell, fever. Started on levaquin and steroids 1 week ago. Now hypoxic to 35% with good pleth, clear lungs with good air movement, no wheezing or crackles.  Patient with perfect mental status in spite such profound hypoxia.  Fingers and lips and nose were cyanotic.  Patient was placed on a nonrebreather with improvement of her sats 88%.  She was then switched to BiPAP.  Review of epic shows that patient's Covid swab from 3 days ago was positive.  EKG showing no ischemic changes.  Labs and chest x-ray are pending.  Will admit patient to the ICU.    _________________________ 4:46 AM on 01/04/2019 -----------------------------------------  Discussed with Dr. Sidney Ace  for admission. Age adjusted d-dimer negative therefore heparin was held. Procalcitonin negative. Patient has been on levaquin for 5 days.  Will hold off abx. Patient reassessed with improvement of her sats on BiPAP.  Remains with good mental status.  Patient's son was informed of the results of Covid and we discussed quarantining and testing for household members and close contacts.   As part of my medical decision making, I reviewed the following data within the [place] notes reviewed and incorporated, Labs reviewed , EKG interpreted , Old EKG reviewed, Old chart reviewed, Radiograph reviewed , Discussed with admitting physician , Notes from prior ED visits and [place] Controlled Substance Database   Patient was evaluated in Emergency Department today for the symptoms described in the history of present illness. Patient was evaluated in the context of the global COVID-19 pandemic, which necessitated consideration that the patient might be at risk for infection with the SARS-CoV-2 virus that causes COVID-19. Institutional protocols and algorithms that pertain to the evaluation of patients at risk for COVID-19 are in a state of rapid change based on information released by regulatory bodies including the CDC and federal and state organizations. These policies and algorithms were followed during the patient's care in the ED.   ____________________________________________   FINAL CLINICAL IMPRESSION(S) / ED DIAGNOSES  Final diagnoses:  Pneumonia due to COVID-19 virus  Acute respiratory failure with hypoxia ([place])      NEW MEDICATIONS STARTED DURING THIS VISIT:  ED Discharge Orders    None       Note:  This document was prepared using Dragon voice recognition software and may include unintentional dictation errors.    Alfred Levins, [place], MD 01/03/2019 [phone]

## 2018-12-26 NOTE — H&P (Signed)
[facility] History and Physical   Patient: Kristin Estrada [id number]   PCP: Frazier Richards, MD DOB: March 14, 1940   DOA: 12/24/2018   DOS: 01/14/2019   DOS: the patient was seen and examined on 12/18/2018  Patient coming from: The patient is coming from Home  Chief Complaint: Shortness of breath and cough  HPI: Kristin Estrada is a 78 y.o. female with Past medical history of COPD, chronic back pain, fibromyalgia, GERD, arthritis, HTN. Patient presents with complaints of 1 week of shortness of breath and cough. She denies any nausea or vomiting. Patient was seen by PCP a few days ago and was started on Levaquin as well as steroids. [facility] where she was tested for COVID-19 on 12/23/2018 which came back positive.  (Results are in [place]). Patient continues to have progressive shortness of breath and was unable to perform any activity at home and therefore decided to come to the hospital. She also had a fever of 102 Fahrenheit yesterday. She reports loss of smell and taste sensation. No smoking no drug abuse no alcohol abuse. Does not use any oxygen at her baseline.  ED Course: Presents with shortness of breath.  Severely hypoxic on nonrebreather.  Initially placed on BiPAP.  Patient was referred for admission for COVID-19 pneumonia.  At her baseline ambulates without assistance independent for most of her ADL;  manages her medication on her own.  Review of Systems: as mentioned in the history of present illness.  All other systems reviewed and are negative.  Past Medical History:  Diagnosis Date   Arthritis    Bronchitis    Bruises easily    Cancer ([place])    breast   Cataracts, bilateral    Chronic airway obstruction, not elsewhere classified    Symbicort daily and Albuterol daily as needed   Chronic back pain    arthritis   Chronic bronchitis (HCC)    Cramps of left lower extremity    Diabetes mellitus without  complication (HCC)    borderline-diet and exercise;takes Cinnamon daily   Esophageal reflux    takes Nexium daily and Dexilant daily as needed   Fibromyalgia    Fibromyalgia    Headache    occasionally   HOH (hard of hearing)    Hyperlipidemia    takes CO Q10 daily   Hypothyroidism    Insomnia    takes Ambien and Trazodone nightly   Joint pain    Joint swelling    Macular degeneration, right eye    wet and to get injection tomorrow   Pneumonia    hx of x 3 with most recent one 2009   Seasonal allergies    takes Zyrtec daily as well as using Nasonex   Shortness of breath dyspnea    Shoulder fracture    Unspecified essential hypertension    takes Amlodipine and Losartan daily   URI (upper respiratory infection)    was on Prednisone and Antibiotic-to complete today   Urinary urgency    Past Surgical History:  Procedure Laterality Date   BREAST BIOPSY     BREAST SURGERY  1972   nodules removed and benign   CATARACT EXTRACTION W/PHACO Left 12/31/2014   Procedure: CATARACT EXTRACTION PHACO AND INTRAOCULAR LENS PLACEMENT ([place]);  Surgeon: Estill Cotta, MD;  Location: [facility];  Service: Ophthalmology;  Laterality: Left;  [place] 01:00 AP% 24.0 CDE 25.53 fluid pack # [id number]   CATARACT EXTRACTION W/PHACO Right 04/27/2017  Procedure: CATARACT EXTRACTION PHACO AND INTRAOCULAR LENS PLACEMENT ([facility]);  Surgeon: Birder Robson, MD;  Location: [facility];  Service: Ophthalmology;  Laterality: Right;  [place] 00:46.3 AP% 12.7 CDE 5.86 Fluid Pack lot # [id number]   DEBRIDEMENT AND CLOSURE WOUND Left 09/10/2017   Procedure: CLOSURE OF LEFT BREAST MASTECTOMY/WOUND;  Surgeon: Wallace Going, DO;  Location: [facility];  Service: Plastics;  Laterality: Left;   JOINT REPLACEMENT     SHOULDER left   MASTECTOMY Left 07/21/2017   mastectomy re exc for + margins 09/10/17 no rad no chemo no anti hormones   MASTECTOMY W/ SENTINEL NODE BIOPSY Left 08/18/2017   Procedure:  MASTECTOMY WITH SENTINEL LYMPH NODE BIOPSY;  Surgeon: [name], MD;  Location: [facility];  Service: General;  Laterality: Left;   RE-EXCISION OF BREAST LUMPECTOMY Left 09/10/2017   Procedure: RE-EXCISION OF BREAST LUMPECTOMY;  Surgeon: [name], MD;  Location: [facility];  Service: General;  Laterality: Left;   REVERSE SHOULDER ARTHROPLASTY Left 04/19/2014   Procedure:  REVERSE TOTAL SHOULDER ARTHROPLASTY;  Surgeon: Marin Shutter, MD;  Location: [place];  Service: Orthopedics;  Laterality: Left;   Social History:  reports that she quit smoking about 14 years ago. Her smoking use included cigarettes. She has a 60.00 pack-year smoking history. She has never used smokeless tobacco. She reports that she does not drink alcohol or use drugs.  Allergies  Allergen Reactions   Protonix [Pantoprazole Sodium] Other (See Comments)    abd cramps   Prednisone Other (See Comments)    Agitation, insomina, mood change. Tolerates injection- not PO Agitation, insomina, mood change. Tolerates injection- not PO   Statins Other (See Comments)    Legs hurt Legs hurt Legs hurt   Family history reviewed and not pertinent Family History  Problem Relation Age of Onset   Heart disease Other    Breast cancer Neg Hx      Prior to Admission medications   Medication Sig Start Date End Date Taking? Authorizing Provider  acetaminophen (TYLENOL) 500 MG tablet Take 500-1,000 mg by mouth every 6 (six) hours as needed for moderate pain or headache.    Yes [provider]  albuterol (PROAIR HFA) 108 (90 BASE) MCG/ACT inhaler Inhale 2 puffs into the lungs every 4 (four) hours as needed. Patient taking differently: Inhale 2 puffs into the lungs every 4 (four) hours as needed for wheezing or shortness of breath.  12/26/13  Yes [name], MD  amLODipine (NORVASC) 5 MG tablet Take 5 mg by mouth daily.   Yes [provider]  Bevacizumab (AVASTIN) 100 MG/4ML SOLN Place 1 Dose  into both eyes every 30 (thirty) days.    Yes [provider]  cetirizine (ZYRTEC) 10 MG tablet Take 10 mg by mouth at bedtime.  03/24/11  Yes [name], MD  cholecalciferol (VITAMIN D) 1000 UNITS tablet Take 1,000 Units by mouth daily.   Yes [provider]  clotrimazole (LOTRIMIN) 1 % cream Apply 1 application topically 2 (two) times daily as needed (for yeast infection).   Yes [provider]  Coenzyme Q10 (CO Q 10) 10 MG CAPS Take 10 mg by mouth daily.    Yes [provider]  diazepam (VALIUM) 5 MG tablet Take 0.5-1 tablets (2.5-5 mg total) by mouth every 6 (six) hours as needed for muscle spasms or sedation. Patient taking differently: Take 5 mg by mouth at bedtime.  04/20/14  Yes Shuford, Olivia Mackie, PA-C  diclofenac (VOLTAREN) 75 MG EC tablet  Take 75 mg by mouth 2 (two) times daily.     Yes [provider]  Homeopathic Products ([facility] LEG CRAMPS PAIN RELIEF PO) Take 2 tablets by mouth every 4 (four) hours as needed (for leg cramps).    Yes [provider]  levofloxacin (LEVAQUIN) 500 MG tablet Take 500 mg by mouth daily.   Yes [provider]  levothyroxine (SYNTHROID) 50 MCG tablet Take 50 mcg by mouth daily before breakfast.    Yes [provider]  losartan (COZAAR) 100 MG tablet Take 100 mg by mouth daily.    Yes [provider]  Magnesium 400 MG CAPS Take 400 mg by mouth See admin instructions. Take 400 mg by mouth in the morning every day and take 400 mg by mouth 3 times weekly at bedtime   Yes [provider]  metoprolol succinate (TOPROL-XL) 25 MG 24 hr tablet Take 25 mg by mouth daily.   Yes [provider]  Multiple Vitamin (MULTIVITAMIN WITH MINERALS) TABS tablet Take 1 tablet by mouth daily.   Yes [provider]  nystatin (MYCOSTATIN) 100000 UNIT/ML suspension Take 5 mLs (500,000 Units total) by mouth as needed. Patient taking differently: Take 5 mLs by mouth daily as needed  (for mouth due to inhaler).  03/27/16  Yes Wilhelmina Mcardle, MD  Polyethyl Glycol-Propyl Glycol (SYSTANE OP) Place 1 drop into both eyes 3 (three) times daily as needed (for dry eyes (at least once daily)).    Yes [provider]  predniSONE (DELTASONE) 20 MG tablet Take 10-60 mg by mouth See admin instructions. Take 3 tablets (60mg ) daily for 2 days, 2 tablets (40mg ) by mouth daily for 2 days, 1 tablet (20mg ) by mouth daily for 2 days then take  tablet (10mg ) by mouth daily for 4 days 12/23/18 12/27/18 Yes [provider]  vitamin C (ASCORBIC ACID) 500 MG tablet Take 500 mg by mouth daily.   Yes [provider]  famotidine (PEPCID) 20 MG tablet Take 1 tablet (20 mg total) by mouth 2 (two) times daily. When coughing Patient not taking: Reported on 12/30/2018 01/29/14   [name], MD  tamoxifen (NOLVADEX) 20 MG tablet Take 1 tablet (20 mg total) by mouth daily. Patient not taking: Reported on 12/22/2018 03/04/18   Lloyd Huger, MD  fluticasone (VERAMYST) 27.5 MCG/SPRAY nasal spray Place 2 sprays into the nose daily as needed. 12/16/10 04/28/11  [name], MD  prochlorperazine (COMPAZINE) 10 MG tablet Take 1 tablet (10 mg total) by mouth every 6 (six) hours as needed (Nausea or vomiting). Patient not taking: Reported on 08/05/2017 07/30/17 08/08/17  Lloyd Huger, MD    Physical Exam: Vitals:   01/11/2019 1345 01/09/2019 1400 01/09/2019 1415 12/29/2018 1430  BP: (!) 134/93 (!) 142/71 (!) 145/64 (!) 145/69  Pulse: 86 84 79 83  Resp: (!) 24 (!) 31 (!) 28 (!) 28  Temp:      TempSrc:      SpO2: 91% (!) 88% 90% (!) 89%  Weight:      Height:        General: alert and oriented to time, place, and person. Appear in marked distress, affect appropriate Eyes: PERRL, Conjunctiva normal ENT: Oral Mucosa Clear, moist  Neck: no JVD, no Abnormal Mass Or lumps Cardiovascular: S1 and S2 Present, no Murmur, peripheral pulses symmetrical Respiratory: increased  respiratory effort, Bilateral Air entry equal and Decreased, no signs of accessory muscle use, bilateral  Crackles, no wheezes Abdomen: Bowel  Sound present, Soft and no tenderness, no hernia Skin: no rashes  Extremities: bilateral  Pedal edema, no calf tenderness Neurologic: without any new focal findings Gait not checked due to patient safety concerns  Data Reviewed: I have personally reviewed and interpreted labs, imaging as discussed below.  CBC: Recent Labs  Lab 01/02/2019 0238  WBC 14.0*  NEUTROABS 11.4*  HGB 13.5  HCT 40.9  MCV 88.1  PLT 0000000   Basic Metabolic Panel: Recent Labs  Lab 12/23/2018 0300  NA 140  K 4.4  CL 103  CO2 25  GLUCOSE 119*  BUN 33*  CREATININE 0.82  CALCIUM 8.8*   GFR: Estimated Creatinine Clearance: 53.4 mL/min (by C-G formula based on SCr of 0.82 mg/dL). Liver Function Tests: Recent Labs  Lab 01/13/2019 0300  AST 29  ALT 21  ALKPHOS 66  BILITOT 0.6  PROT 7.0  ALBUMIN 3.2*   No results for input(s): LIPASE, AMYLASE in the last 168 hours. No results for input(s): AMMONIA in the last 168 hours. Coagulation Profile: No results for input(s): INR, PROTIME in the last 168 hours. Cardiac Enzymes: No results for input(s): CKTOTAL, CKMB, CKMBINDEX, TROPONINI in the last 168 hours. BNP (last 3 results) No results for input(s): PROBNP in the last 8760 hours. HbA1C: No results for input(s): HGBA1C in the last 72 hours. CBG: No results for input(s): GLUCAP in the last 168 hours. Lipid Profile: Recent Labs    01/08/2019 0300  TRIG 170*   Thyroid Function Tests: No results for input(s): TSH, T4TOTAL, FREET4, T3FREE, THYROIDAB in the last 72 hours. Anemia Panel: Recent Labs    12/27/2018 0300  FERRITIN 416*   Urine analysis: No results found for: COLORURINE, APPEARANCEUR, LABSPEC, PHURINE, GLUCOSEU, HGBUR, BILIRUBINUR, KETONESUR, PROTEINUR, UROBILINOGEN, NITRITE, LEUKOCYTESUR  Radiological Exams on Admission: Ct Angio Chest Pe W Or Wo  Contrast  Result Date: 01/11/2019 CLINICAL DATA:  Cough and shortness of breath for the past week. Fever as high as 102. Chest tightness. Loss of smell and taste. Status post left mastectomy for breast cancer in 2019. EXAM: CT ANGIOGRAPHY CHEST WITH CONTRAST TECHNIQUE: Multidetector CT imaging of the chest was performed using the standard protocol during bolus administration of intravenous contrast. Multiplanar CT image reconstructions and MIPs were obtained to evaluate the vascular anatomy. CONTRAST:  32mL OMNIPAQUE IOHEXOL 350 MG/ML SOLN COMPARISON:  Portable chest obtained earlier today. FINDINGS: Cardiovascular: Enlarged heart. Normally opacified pulmonary arteries with no pulmonary arterial filling defects seen. Mediastinum/Nodes: Mildly enlarged subcarinal nodes measuring up to 15 mm in short axis diameter on image number 44 series 4. The hilar and remainder of the mediastinal nodes are normal in size. No axillary adenopathy seen. Normal appearing thyroid gland. No visible esophageal abnormality. Lungs/Pleura: Marked prominence of the interstitial markings throughout the majority of both lungs with associated diffuse airspace opacities in the lower lung zones. No pleural fluid seen. 4 mm average diameter nodule in the anterior aspect of the left lower lobe on image number 54 series 6. There is also a 9 x 8 mm nodule in the right middle lobe on image number 62 series 6, a 7 x 6 mm nodule in the right lower lobe on image number 60 of series 6, an 8 x 7 mm nodule in the right upper lobe on image number 35 of series 6 and multiple additional smaller bilateral lung nodules. Mild bilateral bullous changes in a centrilobular pattern. Upper Abdomen: Small hypervascular area in the superior aspect of the spleen compatible with a flash  filling hemangioma and 2 small oval areas of low density in the lateral segment of the left lobe of the liver. Mild diffuse low density of the liver relative to the spleen.  Musculoskeletal: Left shoulder prosthesis. Thoracic and lower cervical spine degenerative changes. Review of the MIP images confirms the above findings. IMPRESSION: 1. No pulmonary emboli. 2. Marked bilateral interstitial lung disease with associated alveolar opacities, concerning for bilateral pneumonia and pneumonitis based on the patient's symptoms. Interstitial pulmonary edema is less likely in the absence pleural fluid. 3. Multiple bilateral lung nodules, as described above. These are suspicious for metastases given the recent history of breast cancer. 4. Subcarinal adenopathy. This is most likely reactive. Metastatic adenopathy is also a possibility. 5. Two small oval areas of low density in the lateral segment of the left lobe of the liver. These could represent cysts, meningiomas or metastases. 6. Cardiomegaly. 7. Mild changes of COPD with centrilobular emphysema. 8. Mild diffuse hepatic steatosis. Emphysema (ICD10-J43.9). Electronically Signed   By: Claudie Revering M.D.   On: 12/21/2018 12:01   Dg Chest Portable 1 View  Result Date: 01/07/2019 CLINICAL DATA:  Shortness of breath and cough EXAM: PORTABLE CHEST 1 VIEW COMPARISON:  03/01/2015 FINDINGS: Cardiomegaly with moderate interstitial opacity. No sizable pleural effusion or pneumothorax. IMPRESSION: Cardiomegaly and moderate interstitial opacities favored to indicate pulmonary edema. Electronically Signed   By: Ulyses Jarred M.D.   On: 01/16/2019 03:14   EKG: Independently reviewed. normal sinus rhythm, nonspecific ST and T waves changes.  I reviewed all nursing notes, pharmacy notes, vitals, pertinent old records.  Assessment/Plan 1.  Acute hypoxic respiratory failure Acute COVID-19 Viral illness Acute on chronic HFpEF No results found for: [id number] CXR: hazy bilateral peripheral opacities  Recent Labs    12/31/2018 0300  FERRITIN 416*  LDH 449*  CRP 8.8*    Tmax last 24 hours:  Temp (24hrs), Avg:99.4 F (37.4 C), Min:99.4 F  (37.4 C), Max:99.4 F (37.4 C)   Oxygen requirements: Initially was on BiPAP.  Currently on 100% NRB  Antibiotics: None-procalcitonin level negative Diuretics: IV Lasix 80 mg x 1, 60 mg twice daily. Vitamin C and Zinc: Initiated on 01/07/2019 DVT Prophylaxis: Subcutaneous Lovenox weight based. Remdesivir: Initiated on 01/09/2019 Steroids: IV Solu-Medrol 60 mg every 12 hours Actemra: Has not received off-label use of Actemra Discussed about possible use for plasma and patient tells me that to what ever it takes if you think it is going to help me to get better from Covid.  Prone positioning: Patient encouraged to stay in prone position as much as possible.  PPE During this encounter: Patient Isolation: Airborne + Droplet + Contact HCP PPE: CAPR, gown. gloves Patient PPE: None  The treatment plan and use of medications and known side effects were discussed with patient/family. It was clearly explained that there is no proven definitive treatment for COVID-19 infection yet. Any medications used here are based on case reports/anecdotal data which are not peer-reviewed and has not been studied using randomized control trials.  Complete risks and long-term side effects are unknown, however in the best clinical judgment they seem to be of some clinical benefit rather than medical risks.  Patient/family agree with the treatment plan and want to receive these treatments as indicated.  2.  Essential hypertension Continuing Norvasc.  3.  Hypothyroidism. Continuing Synthroid.  4.  GERD. Continuing PPI.  5.  History of breast cancer. Pulmonary nodules. Liver nodule. Patient does have history of left-sided breast cancer SP mastectomy. Mammogram  in June 2020 was negative. CT scan shows evidence of bilateral pulmonary nodule, subcarinal adenopathy as well as 2 area of low-density in the left lobe of the liver concerning for possible metastasis. Recommend outpatient PET scan for further  work-up. Have not discussed this with the patient so far.   Nutrition: Cardiac diet DVT Prophylaxis: Subcutaneous Lovenox  Advance goals of care discussion: Full code   Consults: none   Family Communication: no family was present at bedside, at the time of interview.   Disposition: Admitted as inpatient, step-down unit. Likely to be discharged home 5-6 days.  I have discussed plan of care as described above with RN and patient/family.  Author: Berle Mull, MD [facility] 01/10/2019 2:50 PM   To reach On-call, see care teams to locate the attending and reach out to them via [email or web address]. If 7PM-7AM, please contact night-coverage If you still have difficulty reaching the attending provider, please page the [facility] (Director on Call) for [facility] on amion for assistance.

## 2018-12-26 NOTE — Progress Notes (Signed)
Remdesivir - Pharmacy Brief Note   O:  ALT: 21 CXR: IMPRESSION: Cardiomegaly and moderate interstitial opacities favored to indicate pulmonary edema SpO2: 96% on RA   A/P:  Remdesivir 200 mg IVPB once followed by 100 mg IVPB daily x 4 days.   Tobie Lords, PharmD, BCPS Clinical Pharmacist 12/21/2018 5:58 AM

## 2018-12-26 NOTE — ED Triage Notes (Signed)
Pt arrived from home with c/o shortness of breath since last Sunday; pt arrived to stat desk with cyanosis around her mouth and sats in the 40's; sats 37% when placed on monitor in treatment room; pt placed on 100% NRB; pt had a tele-medicine consult Monday and was put on medications; pt has slowly worsened since; pt talking in short sentences and winded at the end;

## 2018-12-26 NOTE — ED Notes (Signed)
Received paper copy of pt's positive covid test, placed in chart

## 2018-12-27 ENCOUNTER — Other Ambulatory Visit: Payer: Self-pay

## 2018-12-27 ENCOUNTER — Encounter (HOSPITAL_COMMUNITY): Payer: Self-pay

## 2018-12-27 DIAGNOSIS — J9602 Acute respiratory failure with hypercapnia: Secondary | ICD-10-CM

## 2018-12-27 DIAGNOSIS — J9601 Acute respiratory failure with hypoxia: Secondary | ICD-10-CM

## 2018-12-27 DIAGNOSIS — I5032 Chronic diastolic (congestive) heart failure: Secondary | ICD-10-CM

## 2018-12-27 DIAGNOSIS — J9600 Acute respiratory failure, unspecified whether with hypoxia or hypercapnia: Secondary | ICD-10-CM

## 2018-12-27 LAB — COMPREHENSIVE METABOLIC PANEL
ALT: 23 U/L (ref 0–44)
AST: 21 U/L (ref 15–41)
Albumin: 3.2 g/dL — ABNORMAL LOW (ref 3.5–5.0)
Alkaline Phosphatase: 74 U/L (ref 38–126)
Anion gap: 14 (ref 5–15)
BUN: 38 mg/dL — ABNORMAL HIGH (ref 8–23)
CO2: 31 mmol/L (ref 22–32)
Calcium: 8.5 mg/dL — ABNORMAL LOW (ref 8.9–10.3)
Chloride: 97 mmol/L — ABNORMAL LOW (ref 98–111)
Creatinine, Ser: 0.76 mg/dL (ref 0.44–1.00)
GFR calc Af Amer: >60 mL/min (ref >60)
GFR calc non Af Amer: >60 mL/min (ref >60)
Glucose, Bld: 214 mg/dL — ABNORMAL HIGH (ref 70–99)
Potassium: 3.5 mmol/L (ref 3.5–5.1)
Sodium: 142 mmol/L (ref 135–145)
Total Bilirubin: 1.1 mg/dL (ref 0.3–1.2)
Total Protein: 7.1 g/dL (ref 6.5–8.1)

## 2018-12-27 LAB — CBC WITH DIFFERENTIAL/PLATELET
Abs Immature Granulocytes: 0.2 10*3/uL — ABNORMAL HIGH (ref 0.00–0.07)
Basophils Absolute: 0 10*3/uL (ref 0.0–0.1)
Basophils Relative: 0 %
Eosinophils Absolute: 0.1 10*3/uL (ref 0.0–0.5)
Eosinophils Relative: 1 %
HCT: 42.9 % (ref 36.0–46.0)
Hemoglobin: 13.8 g/dL (ref 12.0–15.0)
Immature Granulocytes: 2 %
Lymphocytes Relative: 11 %
Lymphs Abs: 1.1 10*3/uL (ref 0.7–4.0)
MCH: 28.9 pg (ref 26.0–34.0)
MCHC: 32.2 g/dL (ref 30.0–36.0)
MCV: 89.7 fL (ref 80.0–100.0)
Monocytes Absolute: 0.8 10*3/uL (ref 0.1–1.0)
Monocytes Relative: 8 %
Neutro Abs: 8 10*3/uL — ABNORMAL HIGH (ref 1.7–7.7)
Neutrophils Relative %: 78 %
Platelets: 358 10*3/uL (ref 150–400)
RBC: 4.78 MIL/uL (ref 3.87–5.11)
RDW: 14.7 % (ref 11.5–15.5)
WBC: 10.2 10*3/uL (ref 4.0–10.5)
nRBC: 0 % (ref 0.0–0.2)

## 2018-12-27 LAB — ABO/RH: ABO/RH(D): A POS

## 2018-12-27 LAB — C-REACTIVE PROTEIN: CRP: 11.9 mg/dL — ABNORMAL HIGH (ref <1.0)

## 2018-12-27 LAB — D-DIMER, QUANTITATIVE: D-Dimer, Quant: 0.56 ug/mL-FEU — ABNORMAL HIGH (ref 0.00–0.50)

## 2018-12-27 MED ORDER — CLONAZEPAM 0.5 MG PO TABS: 0.2500 mg | ORAL_TABLET | Freq: Every evening | ORAL | Status: DC | PRN

## 2018-12-27 MED ORDER — CVS LEG CRAMPS PAIN RELIEF PO TABS: ORAL_TABLET | ORAL | Status: DC | PRN

## 2018-12-27 MED ORDER — AMLODIPINE BESYLATE 5 MG PO TABS
5.0000 mg | ORAL_TABLET | Freq: Every day | ORAL | Status: DC
  Administered 2018-12-27 – 2019-01-04 (×8): 5 mg via ORAL
  Filled 2018-12-27 (×9): qty 1

## 2018-12-27 MED ORDER — INFLUENZA VAC A&B SA ADJ QUAD 0.5 ML IM PRSY
0.5000 mL | PREFILLED_SYRINGE | INTRAMUSCULAR | Status: DC
  Filled 2018-12-27: qty 0.5

## 2018-12-27 MED ORDER — FAMOTIDINE 20 MG PO TABS
20.0000 mg | ORAL_TABLET | Freq: Two times a day (BID) | ORAL | Status: DC
  Administered 2018-12-27 – 2019-01-04 (×18): 20 mg via ORAL
  Filled 2018-12-27 (×18): qty 1

## 2018-12-27 MED ORDER — LEVOTHYROXINE SODIUM 50 MCG PO TABS
50.0000 ug | ORAL_TABLET | Freq: Every day | ORAL | Status: DC
  Administered 2018-12-27 – 2019-01-04 (×8): 50 ug via ORAL
  Filled 2018-12-27 (×10): qty 1

## 2018-12-27 MED ORDER — SODIUM CHLORIDE 0.9 % IV SOLN
100.0000 mg | INTRAVENOUS | Status: AC
  Administered 2018-12-27 – 2018-12-30 (×4): 100 mg via INTRAVENOUS
  Filled 2018-12-27 (×3): qty 20
  Filled 2018-12-27: qty 100

## 2018-12-27 MED ORDER — PNEUMOCOCCAL VAC POLYVALENT 25 MCG/0.5ML IJ INJ
0.5000 mL | INJECTION | INTRAMUSCULAR | Status: DC
  Filled 2018-12-27: qty 0.5

## 2018-12-27 MED ORDER — IPRATROPIUM-ALBUTEROL 20-100 MCG/ACT IN AERS
1.0000 | INHALATION_SPRAY | Freq: Four times a day (QID) | RESPIRATORY_TRACT | Status: DC
  Administered 2018-12-27 – 2019-01-05 (×34): 1 via RESPIRATORY_TRACT
  Filled 2018-12-27: qty 4

## 2018-12-27 MED ORDER — MAGNESIUM OXIDE 400 (241.3 MG) MG PO TABS
400.0000 mg | ORAL_TABLET | Freq: Two times a day (BID) | ORAL | Status: DC | PRN
  Administered 2018-12-27: 400 mg via ORAL
  Filled 2018-12-27 (×2): qty 1

## 2018-12-27 MED ORDER — DIAZEPAM 5 MG PO TABS
2.5000 mg | ORAL_TABLET | Freq: Four times a day (QID) | ORAL | Status: DC | PRN
  Administered 2018-12-27 – 2019-01-04 (×6): 5 mg via ORAL
  Filled 2018-12-27 (×6): qty 1

## 2018-12-27 MED ORDER — SODIUM CHLORIDE 0.9 % IV SOLN: 100.0000 mg | INTRAVENOUS | Status: DC

## 2018-12-27 NOTE — Progress Notes (Signed)
[facility] PROGRESS NOTE    Progress Note  Kristin Estrada  [id number] DOB: [birth year] DOA: 12/24/2018 PCP: Frazier Richards, MD     Brief Narrative:   Kristin Estrada is an 78 y.o. female past medical history of fibromyalgia, COPD, heart failure sent to the ED for hypoxia.  She relates her hypoxia has been worsening for the last several days with productive cough, she denies any nausea vomiting or diarrhea.  At [facility] ED she was 79 on 4 L of oxygen, she was placed on nonrebreather and his saturations improved, a VBG was done, as she was becoming slow to response that showed an elevated CO2 so she was placed temporarily on BiPAP and her mentation improved and she was changed to a nonrebreather, she also had a fever of 102 the day prior to admission.  Assessment/Plan:   Acute respiratory failure with hypoxia and hypercarbia (HCC) due to  Pneumonia due to COVID-19 virus: SARS-CoV-2 test positive on 12/23/2018 She is currently on a non-ebreather satting 93%. Inflammatory markers are elevated will check daily. She was started empirically on IV steroids and remdesivir will continue remdesivir for 5 days. Continue Vitamin C and zinc. Keep patient prone for at least 16 hours a day. When I got into the room she was completely cyanotic her hands and her face she was not wearing her nonrebreather mask. Her saturations were measuring 66%, she was placed back on a nonrebreather and saturations improved to 90. Informed the nurse that this patient should be anywhere between 88 to 92% saturation.  Essential hypertension: Blood pressure stable continue Norvasc.  Hypothyroidism: Continue Synthroid.  Chronic diastolic CHF (congestive heart failure) (HCC)  History of breast cancer: CT of the chest done on 01/07/2019 showed showed multiple bilateral lung nodules, with subcarinal adenopathy, and 2 small focal areas in the lateral segment of the left lower lobe concerning for metastatic  disease. She will need further work-up as an outpatient with a PET scan.    DVT prophylaxis: lovenox Family Communication:none Disposition Plan/Barrier to D/C: unable to determine Code Status:     Code Status Orders  (From admission, onward)         Start     Ordered   01/06/2019 2236  Full code  Continuous     12/27/2018 2237        Code Status History    Date Active Date Inactive Code Status Order ID Comments User Context   01/16/2019 0631 01/04/2019 2159 Full Code [id number]  Mansy, Arvella Merles, MD ED   09/10/2017 2042 09/11/2017 1723 Full Code [id number]  [name], MD Inpatient   08/18/2017 1551 08/19/2017 1656 Full Code [id number]  [name], MD Inpatient   04/19/2014 1527 04/20/2014 1616 Full Code [id number]  Marcellus Scott Inpatient   Advance Care Planning Activity        IV Access:    Peripheral IV   Procedures and diagnostic studies:   Ct Angio Chest Pe W Or Wo Contrast  Result Date: 12/20/2018 CLINICAL DATA:  Cough and shortness of breath for the past week. Fever as high as 102. Chest tightness. Loss of smell and taste. Status post left mastectomy for breast cancer in 2019. EXAM: CT ANGIOGRAPHY CHEST WITH CONTRAST TECHNIQUE: Multidetector CT imaging of the chest was performed using the standard protocol during bolus administration of intravenous contrast. Multiplanar CT image reconstructions and MIPs were obtained to evaluate the vascular anatomy. CONTRAST:  75mL OMNIPAQUE IOHEXOL 350 MG/ML SOLN COMPARISON:  Portable chest  obtained earlier today. FINDINGS: Cardiovascular: Enlarged heart. Normally opacified pulmonary arteries with no pulmonary arterial filling defects seen. Mediastinum/Nodes: Mildly enlarged subcarinal nodes measuring up to 15 mm in short axis diameter on image number 44 series 4. The hilar and remainder of the mediastinal nodes are normal in size. No axillary adenopathy seen. Normal appearing thyroid gland. No visible esophageal abnormality.  Lungs/Pleura: Marked prominence of the interstitial markings throughout the majority of both lungs with associated diffuse airspace opacities in the lower lung zones. No pleural fluid seen. 4 mm average diameter nodule in the anterior aspect of the left lower lobe on image number 54 series 6. There is also a 9 x 8 mm nodule in the right middle lobe on image number 62 series 6, a 7 x 6 mm nodule in the right lower lobe on image number 60 of series 6, an 8 x 7 mm nodule in the right upper lobe on image number 35 of series 6 and multiple additional smaller bilateral lung nodules. Mild bilateral bullous changes in a centrilobular pattern. Upper Abdomen: Small hypervascular area in the superior aspect of the spleen compatible with a flash filling hemangioma and 2 small oval areas of low density in the lateral segment of the left lobe of the liver. Mild diffuse low density of the liver relative to the spleen. Musculoskeletal: Left shoulder prosthesis. Thoracic and lower cervical spine degenerative changes. Review of the MIP images confirms the above findings. IMPRESSION: 1. No pulmonary emboli. 2. Marked bilateral interstitial lung disease with associated alveolar opacities, concerning for bilateral pneumonia and pneumonitis based on the patient's symptoms. Interstitial pulmonary edema is less likely in the absence pleural fluid. 3. Multiple bilateral lung nodules, as described above. These are suspicious for metastases given the recent history of breast cancer. 4. Subcarinal adenopathy. This is most likely reactive. Metastatic adenopathy is also a possibility. 5. Two small oval areas of low density in the lateral segment of the left lobe of the liver. These could represent cysts, meningiomas or metastases. 6. Cardiomegaly. 7. Mild changes of COPD with centrilobular emphysema. 8. Mild diffuse hepatic steatosis. Emphysema (ICD10-J43.9). Electronically Signed   By: Claudie Revering M.D.   On: 01/01/2019 12:01   Dg Chest  Portable 1 View  Result Date: 12/21/2018 CLINICAL DATA:  Shortness of breath and cough EXAM: PORTABLE CHEST 1 VIEW COMPARISON:  03/01/2015 FINDINGS: Cardiomegaly with moderate interstitial opacity. No sizable pleural effusion or pneumothorax. IMPRESSION: Cardiomegaly and moderate interstitial opacities favored to indicate pulmonary edema. Electronically Signed   By: Ulyses Jarred M.D.   On: 01/02/2019 03:14     Medical Consultants:    None.  Anti-Infectives:   IV remdesivir.  Subjective:    Seth Bake she denies any shortness of breath.  Objective:    Vitals:   01/13/2019 2200 12/27/18 0200 12/27/18 0418 12/27/18 0511  BP: 132/74 112/73 (!) 146/80 132/81  Pulse: 92     Resp: (!) 22 20    Temp: 98.3 F (36.8 C) 98 F (36.7 C) 97.9 F (36.6 C) 98.1 F (36.7 C)  TempSrc: Oral Oral Oral Oral  SpO2: 90%     Weight: 80.6 kg     Height: 5\' 2"  (1.575 m)      SpO2: 90 % O2 Flow Rate (L/min): 100 L/min   Intake/Output Summary (Last 24 hours) at 12/27/2018 0742 Last data filed at 12/27/2018 0530 Gross per 24 hour  Intake 240 ml  Output 100 ml  Net 140 ml   Filed  Weights   12/20/2018 2200  Weight: 80.6 kg    Exam: General exam: In no acute distress. Respiratory system: Good air movement and ankles bilaterally mainly on the right. Cardiovascular system: S1 & S2 heard, RRR. No JVD. Gastrointestinal system: Abdomen is nondistended, soft and nontender.  Central nervous system: Alert and oriented. No focal neurological deficits. Extremities: No pedal edema. Skin: Shaili her face especially the tip of her nose and her hand especially her fingers were cyanotic. Psychiatry: Judgement and insight appear normal. Mood & affect appropriate.    Data Reviewed:    Labs: Basic Metabolic Panel: Recent Labs  Lab 01/10/2019 0300 12/27/18 0353  NA 140 142  K 4.4 3.5  CL 103 97*  CO2 25 31  GLUCOSE 119* 214*  BUN 33* 38*  CREATININE 0.82 0.76  CALCIUM 8.8* 8.5*    GFR Estimated Creatinine Clearance: 55.2 mL/min (by C-G formula based on SCr of 0.76 mg/dL). Liver Function Tests: Recent Labs  Lab 01/09/2019 0300 12/27/18 0353  AST 29 21  ALT 21 23  ALKPHOS 66 74  BILITOT 0.6 1.1  PROT 7.0 7.1  ALBUMIN 3.2* 3.2*   No results for input(s): LIPASE, AMYLASE in the last 168 hours. No results for input(s): AMMONIA in the last 168 hours. Coagulation profile No results for input(s): INR, PROTIME in the last 168 hours. COVID-19 Labs  Recent Labs    12/20/2018 0300 12/27/18 0353  DDIMER  --  0.56*  FERRITIN 416*  --   LDH 449*  --   CRP 8.8* 11.9*    No results found for: SARSCOV2NAA  CBC: Recent Labs  Lab 12/19/2018 0238 12/27/18 0353  WBC 14.0* 10.2  NEUTROABS 11.4* 8.0*  HGB 13.5 13.8  HCT 40.9 42.9  MCV 88.1 89.7  PLT 293 358   Cardiac Enzymes: No results for input(s): CKTOTAL, CKMB, CKMBINDEX, TROPONINI in the last 168 hours. BNP (last 3 results) No results for input(s): PROBNP in the last 8760 hours. CBG: No results for input(s): GLUCAP in the last 168 hours. D-Dimer: Recent Labs    12/27/18 0353  DDIMER 0.56*   Hgb A1c: No results for input(s): HGBA1C in the last 72 hours. Lipid Profile: Recent Labs    01/01/2019 0300  TRIG 170*   Thyroid function studies: No results for input(s): TSH, T4TOTAL, T3FREE, THYROIDAB in the last 72 hours.  Invalid input(s): FREET3 Anemia work up: Recent Labs    01/11/2019 0300  FERRITIN 416*   Sepsis Labs: Recent Labs  Lab 01/11/2019 0238 01/11/2019 0300 12/27/2018 0519 12/27/18 0353  PROCALCITON  --  <0.10  --   --   WBC 14.0*  --   --  10.2  LATICACIDVEN  --  2.3* 1.1  --    Microbiology Recent Results (from the past 240 hour(s))  Blood Culture (routine x 2)     Status: None (Preliminary result)   Collection Time: 01/10/2019  2:38 AM   Specimen: BLOOD  Result Value Ref Range Status   Specimen Description BLOOD LEFT FOREARM  Final   Special Requests   Final    BOTTLES DRAWN  AEROBIC AND ANAEROBIC Blood Culture adequate volume   Culture   Final    NO GROWTH 1 DAY Performed at [facility], [address], [place], [place] [address]    Report Status PENDING  Incomplete  Blood Culture (routine x 2)     Status: None (Preliminary result)   Collection Time: 12/29/2018  2:38 AM   Specimen: BLOOD  Result Value Ref Range Status   Specimen Description BLOOD RIGHT HAND  Final   Special Requests   Final    BOTTLES DRAWN AEROBIC AND ANAEROBIC Blood Culture adequate volume   Culture   Final    NO GROWTH 1 DAY Performed at [facility], [address], [place], [place] [address]    Report Status PENDING  Incomplete     Medications:    enoxaparin (LOVENOX) injection  40 mg Subcutaneous Q24H   [START ON 12/28/2018] influenza vaccine adjuvanted  0.5 mL Intramuscular Tomorrow-1000   methylPREDNISolone (SOLU-MEDROL) injection  80 mg Intravenous Q12H   [START ON 12/28/2018] pneumococcal 23 valent vaccine  0.5 mL Intramuscular Tomorrow-1000   sodium chloride flush  3 mL Intravenous Q12H   vitamin C  500 mg Oral Daily   zinc sulfate  220 mg Oral Daily   Continuous Infusions:  sodium chloride     [START ON 12/28/2018] remdesivir 100 mg in NS 250 mL        LOS: 1 day   Charlynne Cousins  [facility]  12/27/2018, 7:42 AM

## 2018-12-27 NOTE — Progress Notes (Signed)
Offered to call son to update, patient declined. Stated that she has talked with him 4 times today already and he is also covid positive at home so she wants him to rest.

## 2018-12-27 NOTE — Progress Notes (Signed)
PHARMACIST - PHYSICIAN ORDER COMMUNICATION  CONCERNING: P&T Medication Policy on Herbal Medications  DESCRIPTION:  This patient's order for:  [employer] Pain Relief  ( homeopathic ingredients) has been noted.  This product(s) is classified as an "herbal" or natural product. Due to a lack of definitive safety studies or FDA approval, nonstandard manufacturing practices, plus the potential risk of unknown drug-drug interactions while on inpatient medications, the Pharmacy and Therapeutics Committee does not permit the use of "herbal" or natural products of this type within [facility].   ACTION TAKEN: The pharmacy department is unable to verify this order at this time and your patient has been informed of this safety policy. Please reevaluate patient's clinical condition at discharge and address if the herbal or natural product(s) should be resumed at that time.   Nicole Cella, RPh Clinical Pharmacist 12/27/2018 9:00 AM

## 2018-12-27 NOTE — Plan of Care (Signed)

## 2018-12-27 NOTE — Progress Notes (Signed)
Gave update to son

## 2018-12-27 NOTE — H&P (Signed)
History and Physical    Kristin Estrada [id number] DOB: 07-10-1940 DOA: 01/10/2019  PCP: Frazier Richards, MD  Patient coming from: home  Chief Complaint:  sob  HPI: Kristin Estrada is a 78 y.o. female with medical history significant of fibromyalgia, copd, chf sent to ED for sob and hypoxia.  She has been worsening for several days with a lot of coughing.  No cp or abd pain.  No n/v/d.  dound to have covid pna and referred for admission for such. Feeling better on nrb.  Was prev on bipap now off.  mentating normally at this time.  Review of Systems: As per HPI otherwise 10 point review of systems negative.   Past Medical History:  Diagnosis Date   Arthritis    Bronchitis    Bruises easily    Cancer ([place])    breast   Cataracts, bilateral    Chronic airway obstruction, not elsewhere classified    Symbicort daily and Albuterol daily as needed   Chronic back pain    arthritis   Chronic bronchitis (HCC)    Cramps of left lower extremity    Diabetes mellitus without complication (HCC)    borderline-diet and exercise;takes Cinnamon daily   Esophageal reflux    takes Nexium daily and Dexilant daily as needed   Fibromyalgia    Fibromyalgia    Headache    occasionally   HOH (hard of hearing)    Hyperlipidemia    takes CO Q10 daily   Hypothyroidism    Insomnia    takes Ambien and Trazodone nightly   Joint pain    Joint swelling    Macular degeneration, right eye    wet and to get injection tomorrow   Pneumonia    hx of x 3 with most recent one 2009   Seasonal allergies    takes Zyrtec daily as well as using Nasonex   Shortness of breath dyspnea    Shoulder fracture    Unspecified essential hypertension    takes Amlodipine and Losartan daily   URI (upper respiratory infection)    was on Prednisone and Antibiotic-to complete today   Urinary urgency     Past Surgical History:  Procedure Laterality Date   BREAST BIOPSY     BREAST  SURGERY  1972   nodules removed and benign   CATARACT EXTRACTION W/PHACO Left 12/31/2014   Procedure: CATARACT EXTRACTION PHACO AND INTRAOCULAR LENS PLACEMENT ([place]);  Surgeon: Estill Cotta, MD;  Location: [facility];  Service: Ophthalmology;  Laterality: Left;  [place] 01:00 AP% 24.0 CDE 25.53 fluid pack # [id number]   CATARACT EXTRACTION W/PHACO Right 04/27/2017   Procedure: CATARACT EXTRACTION PHACO AND INTRAOCULAR LENS PLACEMENT ([facility]);  Surgeon: Birder Robson, MD;  Location: [facility];  Service: Ophthalmology;  Laterality: Right;  [place] 00:46.3 AP% 12.7 CDE 5.86 Fluid Pack lot # [id number]   DEBRIDEMENT AND CLOSURE WOUND Left 09/10/2017   Procedure: CLOSURE OF LEFT BREAST MASTECTOMY/WOUND;  Surgeon: Wallace Going, DO;  Location: [facility];  Service: Plastics;  Laterality: Left;   JOINT REPLACEMENT     SHOULDER left   MASTECTOMY Left 07/21/2017   mastectomy re exc for + margins 09/10/17 no rad no chemo no anti hormones   MASTECTOMY W/ SENTINEL NODE BIOPSY Left 08/18/2017   Procedure: MASTECTOMY WITH SENTINEL LYMPH NODE BIOPSY;  Surgeon: [name], MD;  Location: [facility];  Service: General;  Laterality: Left;   RE-EXCISION OF BREAST LUMPECTOMY Left 09/10/2017   Procedure: RE-EXCISION OF  BREAST LUMPECTOMY;  Surgeon: [name], MD;  Location: [facility];  Service: General;  Laterality: Left;   REVERSE SHOULDER ARTHROPLASTY Left 04/19/2014   Procedure:  REVERSE TOTAL SHOULDER ARTHROPLASTY;  Surgeon: Marin Shutter, MD;  Location: [place];  Service: Orthopedics;  Laterality: Left;     reports that she quit smoking about 14 years ago. Her smoking use included cigarettes. She has a 60.00 pack-year smoking history. She has never used smokeless tobacco. She reports that she does not drink alcohol or use drugs.  Allergies  Allergen Reactions   Protonix [Pantoprazole Sodium] Other (See Comments)    abd cramps   Prednisone Other (See Comments)    Agitation, insomina,  mood change. Tolerates injection- not PO Agitation, insomina, mood change. Tolerates injection- not PO   Statins Other (See Comments)    Legs hurt Legs hurt Legs hurt    Family History  Problem Relation Age of Onset   Heart disease Other    Breast cancer Neg Hx     Prior to Admission medications   Medication Sig Start Date End Date Taking? Authorizing Provider  acetaminophen (TYLENOL) 500 MG tablet Take 500-1,000 mg by mouth every 6 (six) hours as needed for moderate pain or headache.     [provider]  albuterol (PROAIR HFA) 108 (90 BASE) MCG/ACT inhaler Inhale 2 puffs into the lungs every 4 (four) hours as needed. Patient taking differently: Inhale 2 puffs into the lungs every 4 (four) hours as needed for wheezing or shortness of breath.  12/26/13   [name], MD  amLODipine (NORVASC) 5 MG tablet Take 5 mg by mouth daily.    [provider]  Bevacizumab (AVASTIN) 100 MG/4ML SOLN Place 1 Dose into both eyes every 30 (thirty) days.     [provider]  cetirizine (ZYRTEC) 10 MG tablet Take 10 mg by mouth at bedtime.  03/24/11   [name], MD  cholecalciferol (VITAMIN D) 1000 UNITS tablet Take 1,000 Units by mouth daily.    [provider]  clotrimazole (LOTRIMIN) 1 % cream Apply 1 application topically 2 (two) times daily as needed (for yeast infection).    [provider]  Coenzyme Q10 (CO Q 10) 10 MG CAPS Take 10 mg by mouth daily.     [provider]  diazepam (VALIUM) 5 MG tablet Take 0.5-1 tablets (2.5-5 mg total) by mouth every 6 (six) hours as needed for muscle spasms or sedation. Patient taking differently: Take 5 mg by mouth at bedtime.  04/20/14   Shuford, Olivia Mackie, PA-C  diclofenac (VOLTAREN) 75 MG EC tablet Take 75 mg by mouth 2 (two) times daily.      [provider]  famotidine (PEPCID) 20 MG tablet Take 1 tablet (20 mg total) by mouth 2 (two) times daily. When coughing Patient not taking: Reported  on 01/06/2019 01/29/14   [name], MD  furosemide (LASIX) 10 MG/ML injection Inject 6 mLs (60 mg total) into the vein 2 (two) times daily. 01/01/2019   Lavina Hamman, MD  Homeopathic Products ([employer] LEG CRAMPS PAIN RELIEF PO) Take 2 tablets by mouth every 4 (four) hours as needed (for leg cramps).     [provider]  Ipratropium-Albuterol (COMBIVENT) 20-100 MCG/ACT AERS respimat Inhale 1 puff into the lungs every 6 (six) hours. 12/19/2018   Lavina Hamman, MD  levofloxacin (LEVAQUIN) 500 MG tablet Take 500 mg by mouth daily.    [provider]  levothyroxine (SYNTHROID) 50  MCG tablet Take 50 mcg by mouth daily before breakfast.     [provider]  losartan (COZAAR) 100 MG tablet Take 100 mg by mouth daily.     [provider]  Magnesium 400 MG CAPS Take 400 mg by mouth See admin instructions. Take 400 mg by mouth in the morning every day and take 400 mg by mouth 3 times weekly at bedtime    [provider]  methylPREDNISolone sodium succinate (SOLU-MEDROL) 125 mg/2 mL injection Inject 0.96 mLs (60 mg total) into the vein every 6 (six) hours. 01/12/2019   Lavina Hamman, MD  metoprolol succinate (TOPROL-XL) 25 MG 24 hr tablet Take 25 mg by mouth daily.    [provider]  Multiple Vitamin (MULTIVITAMIN WITH MINERALS) TABS tablet Take 1 tablet by mouth daily.    [provider]  nystatin (MYCOSTATIN) 100000 UNIT/ML suspension Take 5 mLs (500,000 Units total) by mouth as needed. Patient taking differently: Take 5 mLs by mouth daily as needed (for mouth due to inhaler).  03/27/16   Wilhelmina Mcardle, MD  Polyethyl Glycol-Propyl Glycol (SYSTANE OP) Place 1 drop into both eyes 3 (three) times daily as needed (for dry eyes (at least once daily)).     [provider]  predniSONE (DELTASONE) 20 MG tablet Take 10-60 mg by mouth See admin instructions. Take 3 tablets (60mg ) daily for 2 days, 2 tablets (40mg ) by mouth daily for 2 days, 1  tablet (20mg ) by mouth daily for 2 days then take  tablet (10mg ) by mouth daily for 4 days 12/23/18 12/27/18  [provider]  tamoxifen (NOLVADEX) 20 MG tablet Take 1 tablet (20 mg total) by mouth daily. Patient not taking: Reported on 12/31/2018 03/04/18   Lloyd Huger, MD  vitamin C (ASCORBIC ACID) 500 MG tablet Take 500 mg by mouth daily.    [provider]  fluticasone (VERAMYST) 27.5 MCG/SPRAY nasal spray Place 2 sprays into the nose daily as needed. 12/16/10 04/28/11  [name], MD  prochlorperazine (COMPAZINE) 10 MG tablet Take 1 tablet (10 mg total) by mouth every 6 (six) hours as needed (Nausea or vomiting). Patient not taking: Reported on 08/05/2017 07/30/17 08/08/17  Lloyd Huger, MD    Physical Exam: Vitals:   01/10/2019 2200  BP: 132/74  Pulse: 92  Resp: (!) 22  Temp: 98.3 F (36.8 C)  TempSrc: Oral  SpO2: 90%  Weight: 80.6 kg  Height: 5\' 2"  (1.575 m)      Constitutional: NAD, calm, comfortable Vitals:   12/25/2018 2200  BP: 132/74  Pulse: 92  Resp: (!) 22  Temp: 98.3 F (36.8 C)  TempSrc: Oral  SpO2: 90%  Weight: 80.6 kg  Height: 5\' 2"  (1.575 m)   Eyes: PERRL, lids and conjunctivae normal ENMT: Mucous membranes are moist. Posterior pharynx clear of any exudate or lesions.Normal dentition.  Neck: normal, supple, no masses, no thyromegaly Respiratory: clear to auscultation bilaterally, no wheezing, no crackles. Normal respiratory effort. No accessory muscle use.  Cardiovascular: Regular rate and rhythm, no murmurs / rubs / gallops. No extremity edema. 2+ pedal pulses. No carotid bruits.  Abdomen: no tenderness, no masses palpated. No hepatosplenomegaly. Bowel sounds positive.  Musculoskeletal: no clubbing / cyanosis. No joint deformity upper and lower extremities. Good ROM, no contractures. Normal muscle tone.  Skin: no rashes, lesions, ulcers. No induration Neurologic: CN 2-12 grossly intact. Sensation intact, DTR normal.  Strength 5/5 in all 4.  Psychiatric: Normal judgment and insight. Alert  and oriented x 3. Normal mood.    Labs on Admission: I have personally reviewed following labs and imaging studies  CBC: Recent Labs  Lab 01/02/2019 0238  WBC 14.0*  NEUTROABS 11.4*  HGB 13.5  HCT 40.9  MCV 88.1  PLT 0000000   Basic Metabolic Panel: Recent Labs  Lab 12/27/2018 0300  NA 140  K 4.4  CL 103  CO2 25  GLUCOSE 119*  BUN 33*  CREATININE 0.82  CALCIUM 8.8*   GFR: Estimated Creatinine Clearance: 53.8 mL/min (by C-G formula based on SCr of 0.82 mg/dL). Liver Function Tests: Recent Labs  Lab 12/21/2018 0300  AST 29  ALT 21  ALKPHOS 66  BILITOT 0.6  PROT 7.0  ALBUMIN 3.2*   No results for input(s): LIPASE, AMYLASE in the last 168 hours. No results for input(s): AMMONIA in the last 168 hours. Coagulation Profile: No results for input(s): INR, PROTIME in the last 168 hours. Cardiac Enzymes: No results for input(s): CKTOTAL, CKMB, CKMBINDEX, TROPONINI in the last 168 hours. BNP (last 3 results) No results for input(s): PROBNP in the last 8760 hours. HbA1C: No results for input(s): HGBA1C in the last 72 hours. CBG: No results for input(s): GLUCAP in the last 168 hours. Lipid Profile: Recent Labs    12/23/2018 0300  TRIG 170*   Thyroid Function Tests: No results for input(s): TSH, T4TOTAL, FREET4, T3FREE, THYROIDAB in the last 72 hours. Anemia Panel: Recent Labs    12/30/2018 0300  FERRITIN 416*   Urine analysis: No results found for: COLORURINE, APPEARANCEUR, LABSPEC, PHURINE, GLUCOSEU, HGBUR, BILIRUBINUR, KETONESUR, PROTEINUR, UROBILINOGEN, NITRITE, LEUKOCYTESUR Sepsis Labs: !!!!!!!!!!!!!!!!!!!!!!!!!!!!!!!!!!!!!!!!!!!! @LABRCNTIP (procalcitonin:4,lacticidven:4) ) Recent Results (from the past 240 hour(s))  Blood Culture (routine x 2)     Status: None (Preliminary result)   Collection Time: 01/14/2019  2:38 AM   Specimen: BLOOD  Result Value Ref Range Status   Specimen Description  BLOOD LEFT FOREARM  Final   Special Requests   Final    BOTTLES DRAWN AEROBIC AND ANAEROBIC Blood Culture adequate volume   Culture   Final    NO GROWTH < 12 HOURS Performed at [facility], [address] [place], [place] [address]    Report Status PENDING  Incomplete  Blood Culture (routine x 2)     Status: None (Preliminary result)   Collection Time: 12/25/2018  2:38 AM   Specimen: BLOOD  Result Value Ref Range Status   Specimen Description BLOOD RIGHT HAND  Final   Special Requests   Final    BOTTLES DRAWN AEROBIC AND ANAEROBIC Blood Culture adequate volume   Culture   Final    NO GROWTH < 12 HOURS Performed at [facility], [address] [place], [place] [address]    Report Status PENDING  Incomplete     Radiological Exams on Admission: Ct Angio Chest Pe W Or Wo Contrast  Result Date: 01/04/2019 CLINICAL DATA:  Cough and shortness of breath for the past week. Fever as high as 102. Chest tightness. Loss of smell and taste. Status post left mastectomy for breast cancer in 2019. EXAM: CT ANGIOGRAPHY CHEST WITH CONTRAST TECHNIQUE: Multidetector CT imaging of the chest was performed using the standard protocol during bolus administration of intravenous contrast. Multiplanar CT image reconstructions and MIPs were obtained to evaluate the vascular anatomy. CONTRAST:  66mL OMNIPAQUE IOHEXOL 350 MG/ML SOLN COMPARISON:  Portable chest obtained earlier today. FINDINGS: Cardiovascular: Enlarged heart. Normally opacified pulmonary arteries with no pulmonary arterial filling defects seen. Mediastinum/Nodes: Mildly  enlarged subcarinal nodes measuring up to 15 mm in short axis diameter on image number 44 series 4. The hilar and remainder of the mediastinal nodes are normal in size. No axillary adenopathy seen. Normal appearing thyroid gland. No visible esophageal abnormality. Lungs/Pleura: Marked prominence of the interstitial markings throughout the majority of both lungs with  associated diffuse airspace opacities in the lower lung zones. No pleural fluid seen. 4 mm average diameter nodule in the anterior aspect of the left lower lobe on image number 54 series 6. There is also a 9 x 8 mm nodule in the right middle lobe on image number 62 series 6, a 7 x 6 mm nodule in the right lower lobe on image number 60 of series 6, an 8 x 7 mm nodule in the right upper lobe on image number 35 of series 6 and multiple additional smaller bilateral lung nodules. Mild bilateral bullous changes in a centrilobular pattern. Upper Abdomen: Small hypervascular area in the superior aspect of the spleen compatible with a flash filling hemangioma and 2 small oval areas of low density in the lateral segment of the left lobe of the liver. Mild diffuse low density of the liver relative to the spleen. Musculoskeletal: Left shoulder prosthesis. Thoracic and lower cervical spine degenerative changes. Review of the MIP images confirms the above findings. IMPRESSION: 1. No pulmonary emboli. 2. Marked bilateral interstitial lung disease with associated alveolar opacities, concerning for bilateral pneumonia and pneumonitis based on the patient's symptoms. Interstitial pulmonary edema is less likely in the absence pleural fluid. 3. Multiple bilateral lung nodules, as described above. These are suspicious for metastases given the recent history of breast cancer. 4. Subcarinal adenopathy. This is most likely reactive. Metastatic adenopathy is also a possibility. 5. Two small oval areas of low density in the lateral segment of the left lobe of the liver. These could represent cysts, meningiomas or metastases. 6. Cardiomegaly. 7. Mild changes of COPD with centrilobular emphysema. 8. Mild diffuse hepatic steatosis. Emphysema (ICD10-J43.9). Electronically Signed   By: Claudie Revering M.D.   On: 12/29/2018 12:01   Dg Chest Portable 1 View  Result Date: 12/24/2018 CLINICAL DATA:  Shortness of breath and cough EXAM: PORTABLE CHEST  1 VIEW COMPARISON:  03/01/2015 FINDINGS: Cardiomegaly with moderate interstitial opacity. No sizable pleural effusion or pneumothorax. IMPRESSION: Cardiomegaly and moderate interstitial opacities favored to indicate pulmonary edema. Electronically Signed   By: Ulyses Jarred M.D.   On: 12/29/2018 03:14   Old chart reviewed cxr reviewed bilateral opacities present  Assessment/Plan 78 yo female with acute resp failure due to bilateral covid pna  Principal Problem:   Pneumonia due to COVID-19 virus- pt agreeable to remdisivir and steroids.  Also lovenox/vit c/zinc.  Wean o2 as tolerates.  Daily labs ordered  Active Problems:   Acute respiratory failure due to COVID-19 [facility])- as above     DVT prophylaxis: lovenox Code Status:  full Family Communication: none Disposition Plan:  days Consults called:  none Admission status:  admission   Louie Meaders A MD [facility]  If 7PM-7AM, please contact night-coverage [email or web address] Password [email or web address]  12/27/2018, 12:44 AM

## 2018-12-28 ENCOUNTER — Inpatient Hospital Stay (HOSPITAL_COMMUNITY): Payer: PPO

## 2018-12-28 DIAGNOSIS — E785 Hyperlipidemia, unspecified: Secondary | ICD-10-CM | POA: Diagnosis present

## 2018-12-28 DIAGNOSIS — IMO0002 Reserved for concepts with insufficient information to code with codable children: Secondary | ICD-10-CM | POA: Diagnosis present

## 2018-12-28 DIAGNOSIS — E118 Type 2 diabetes mellitus with unspecified complications: Secondary | ICD-10-CM

## 2018-12-28 DIAGNOSIS — C50512 Malignant neoplasm of lower-outer quadrant of left female breast: Secondary | ICD-10-CM

## 2018-12-28 DIAGNOSIS — E1165 Type 2 diabetes mellitus with hyperglycemia: Secondary | ICD-10-CM | POA: Diagnosis present

## 2018-12-28 DIAGNOSIS — E7800 Pure hypercholesterolemia, unspecified: Secondary | ICD-10-CM

## 2018-12-28 DIAGNOSIS — J449 Chronic obstructive pulmonary disease, unspecified: Secondary | ICD-10-CM

## 2018-12-28 DIAGNOSIS — I10 Essential (primary) hypertension: Secondary | ICD-10-CM

## 2018-12-28 DIAGNOSIS — I5032 Chronic diastolic (congestive) heart failure: Secondary | ICD-10-CM

## 2018-12-28 LAB — LIPID PANEL
Cholesterol: 238 mg/dL — ABNORMAL HIGH (ref 0–200)
HDL: 33 mg/dL — ABNORMAL LOW (ref >40)
LDL Cholesterol: 166 mg/dL — ABNORMAL HIGH (ref 0–99)
Total CHOL/HDL Ratio: 7.2 RATIO
Triglycerides: 194 mg/dL — ABNORMAL HIGH (ref <150)
VLDL: 39 mg/dL (ref 0–40)

## 2018-12-28 LAB — HEMOGLOBIN A1C
Hgb A1c MFr Bld: 7.1 % — ABNORMAL HIGH (ref 4.8–5.6)
Mean Plasma Glucose: 157.07 mg/dL

## 2018-12-28 LAB — CBC WITH DIFFERENTIAL/PLATELET
Abs Immature Granulocytes: 0.37 10*3/uL — ABNORMAL HIGH (ref 0.00–0.07)
Basophils Absolute: 0.1 10*3/uL (ref 0.0–0.1)
Basophils Relative: 1 %
Eosinophils Absolute: 0 10*3/uL (ref 0.0–0.5)
Eosinophils Relative: 0 %
HCT: 46.2 % — ABNORMAL HIGH (ref 36.0–46.0)
Hemoglobin: 14.8 g/dL (ref 12.0–15.0)
Immature Granulocytes: 3 %
Lymphocytes Relative: 10 %
Lymphs Abs: 1.2 10*3/uL (ref 0.7–4.0)
MCH: 29 pg (ref 26.0–34.0)
MCHC: 32 g/dL (ref 30.0–36.0)
MCV: 90.6 fL (ref 80.0–100.0)
Monocytes Absolute: 1 10*3/uL (ref 0.1–1.0)
Monocytes Relative: 8 %
Neutro Abs: 9.2 10*3/uL — ABNORMAL HIGH (ref 1.7–7.7)
Neutrophils Relative %: 78 %
Platelets: 403 10*3/uL — ABNORMAL HIGH (ref 150–400)
RBC: 5.1 MIL/uL (ref 3.87–5.11)
RDW: 14.7 % (ref 11.5–15.5)
WBC: 11.9 10*3/uL — ABNORMAL HIGH (ref 4.0–10.5)
nRBC: 0 % (ref 0.0–0.2)

## 2018-12-28 LAB — BLOOD GAS, VENOUS
Acid-Base Excess: 4.7 mmol/L — ABNORMAL HIGH (ref 0.0–2.0)
Bicarbonate: 29.2 mmol/L — ABNORMAL HIGH (ref 20.0–28.0)
FIO2: 100
O2 Saturation: 43.8 %
Patient temperature: 37
pCO2, Ven: 42 mmHg — ABNORMAL LOW (ref 44.0–60.0)
pH, Ven: 7.45 — ABNORMAL HIGH (ref 7.250–7.430)

## 2018-12-28 LAB — ECHOCARDIOGRAM COMPLETE
Height: 62 in
Weight: 2843.05 oz

## 2018-12-28 LAB — FERRITIN: Ferritin: 397 ng/mL — ABNORMAL HIGH (ref 11–307)

## 2018-12-28 LAB — GLUCOSE, CAPILLARY
Glucose-Capillary: 124 mg/dL — ABNORMAL HIGH (ref 70–99)
Glucose-Capillary: 433 mg/dL — ABNORMAL HIGH (ref 70–99)

## 2018-12-28 LAB — COMPREHENSIVE METABOLIC PANEL
ALT: 22 U/L (ref 0–44)
AST: 17 U/L (ref 15–41)
Albumin: 3.1 g/dL — ABNORMAL LOW (ref 3.5–5.0)
Alkaline Phosphatase: 79 U/L (ref 38–126)
Anion gap: 13 (ref 5–15)
BUN: 48 mg/dL — ABNORMAL HIGH (ref 8–23)
CO2: 29 mmol/L (ref 22–32)
Calcium: 8.8 mg/dL — ABNORMAL LOW (ref 8.9–10.3)
Chloride: 96 mmol/L — ABNORMAL LOW (ref 98–111)
Creatinine, Ser: 0.82 mg/dL (ref 0.44–1.00)
GFR calc Af Amer: >60 mL/min (ref >60)
GFR calc non Af Amer: >60 mL/min (ref >60)
Glucose, Bld: 291 mg/dL — ABNORMAL HIGH (ref 70–99)
Potassium: 4.2 mmol/L (ref 3.5–5.1)
Sodium: 138 mmol/L (ref 135–145)
Total Bilirubin: 0.5 mg/dL (ref 0.3–1.2)
Total Protein: 7.1 g/dL (ref 6.5–8.1)

## 2018-12-28 LAB — MAGNESIUM: Magnesium: 2.7 mg/dL — ABNORMAL HIGH (ref 1.7–2.4)

## 2018-12-28 LAB — C-REACTIVE PROTEIN: CRP: 6.2 mg/dL — ABNORMAL HIGH (ref <1.0)

## 2018-12-28 LAB — D-DIMER, QUANTITATIVE: D-Dimer, Quant: 0.62 ug/mL-FEU — ABNORMAL HIGH (ref 0.00–0.50)

## 2018-12-28 LAB — PHOSPHORUS: Phosphorus: 2.8 mg/dL (ref 2.5–4.6)

## 2018-12-28 LAB — LACTATE DEHYDROGENASE: LDH: 443 U/L — ABNORMAL HIGH (ref 98–192)

## 2018-12-28 MED ORDER — AMLODIPINE BESYLATE 5 MG PO TABS: 5.0000 mg | ORAL_TABLET | Freq: Every day | ORAL | Status: DC

## 2018-12-28 MED ORDER — INSULIN ASPART 100 UNIT/ML ~~LOC~~ SOLN
0.0000 [IU] | SUBCUTANEOUS | Status: DC
  Administered 2018-12-28: 11 [IU] via SUBCUTANEOUS
  Administered 2018-12-28: 7 [IU] via SUBCUTANEOUS
  Administered 2018-12-29: 11 [IU] via SUBCUTANEOUS
  Administered 2018-12-29: 13:00:00 7 [IU] via SUBCUTANEOUS
  Administered 2018-12-29: 4 [IU] via SUBCUTANEOUS
  Administered 2018-12-30 (×2): 3 [IU] via SUBCUTANEOUS
  Administered 2018-12-30: 22:00:00 4 [IU] via SUBCUTANEOUS
  Administered 2018-12-30: 7 [IU] via SUBCUTANEOUS
  Administered 2018-12-30: 09:00:00 3 [IU] via SUBCUTANEOUS
  Administered 2018-12-31: 4 [IU] via SUBCUTANEOUS
  Administered 2018-12-31: 11 [IU] via SUBCUTANEOUS
  Administered 2018-12-31 (×2): 4 [IU] via SUBCUTANEOUS
  Administered 2018-12-31: 02:00:00 3 [IU] via SUBCUTANEOUS
  Administered 2019-01-01 (×2): 4 [IU] via SUBCUTANEOUS
  Administered 2019-01-01: 17:00:00 7 [IU] via SUBCUTANEOUS
  Administered 2019-01-02: 21:00:00 11 [IU] via SUBCUTANEOUS
  Administered 2019-01-02: 4 [IU] via SUBCUTANEOUS
  Administered 2019-01-02: 13:00:00 15 [IU] via SUBCUTANEOUS
  Administered 2019-01-02 – 2019-01-03 (×2): 7 [IU] via SUBCUTANEOUS
  Administered 2019-01-03: 11 [IU] via SUBCUTANEOUS
  Administered 2019-01-03: 20:00:00 7 [IU] via SUBCUTANEOUS
  Administered 2019-01-04: 11 [IU] via SUBCUTANEOUS
  Administered 2019-01-04: 3 [IU] via SUBCUTANEOUS
  Administered 2019-01-04: 7 [IU] via SUBCUTANEOUS

## 2018-12-28 MED ORDER — METOPROLOL SUCCINATE ER 25 MG PO TB24
25.0000 mg | ORAL_TABLET | Freq: Every day | ORAL | Status: DC
  Administered 2018-12-28 – 2019-01-04 (×8): 25 mg via ORAL
  Filled 2018-12-28 (×9): qty 1

## 2018-12-28 MED ORDER — INSULIN ASPART 100 UNIT/ML ~~LOC~~ SOLN: 0.0000 [IU] | SUBCUTANEOUS | Status: DC

## 2018-12-28 MED ORDER — POLYVINYL ALCOHOL 1.4 % OP SOLN
1.0000 [drp] | Freq: Three times a day (TID) | OPHTHALMIC | Status: DC | PRN
  Administered 2019-01-02 – 2019-01-04 (×3): 1 [drp] via OPHTHALMIC
  Filled 2018-12-28: qty 15

## 2018-12-28 MED ORDER — POLYETHYL GLYCOL-PROPYL GLYCOL 0.4-0.3 % OP GEL
Freq: Three times a day (TID) | OPHTHALMIC | Status: DC | PRN
  Filled 2018-12-28: qty 10

## 2018-12-28 MED ORDER — INSULIN ASPART 100 UNIT/ML ~~LOC~~ SOLN
30.0000 [IU] | Freq: Once | SUBCUTANEOUS | Status: AC
  Administered 2018-12-28: 30 [IU] via SUBCUTANEOUS

## 2018-12-28 MED ORDER — DEXAMETHASONE SODIUM PHOSPHATE 10 MG/ML IJ SOLN
6.0000 mg | INTRAMUSCULAR | Status: DC
  Administered 2018-12-28 – 2019-01-04 (×8): 6 mg via INTRAVENOUS
  Filled 2018-12-28 (×8): qty 1

## 2018-12-28 MED ORDER — ENOXAPARIN SODIUM 40 MG/0.4ML ~~LOC~~ SOLN
40.0000 mg | Freq: Every day | SUBCUTANEOUS | Status: DC
  Administered 2018-12-28 – 2019-01-01 (×5): 40 mg via SUBCUTANEOUS
  Filled 2018-12-28 (×5): qty 0.4

## 2018-12-28 MED ORDER — INSULIN GLARGINE 100 UNIT/ML ~~LOC~~ SOLN
8.0000 [IU] | Freq: Every day | SUBCUTANEOUS | Status: DC
  Administered 2018-12-28 – 2019-01-04 (×8): 8 [IU] via SUBCUTANEOUS
  Filled 2018-12-28 (×9): qty 0.08

## 2018-12-28 NOTE — Progress Notes (Signed)
*   Echocardiogram 2D Echocardiogram has been performed.  Darlina Sicilian M 12/28/2018, 8:23 AM

## 2018-12-28 NOTE — Progress Notes (Signed)
Continues to require 15 L NRB. Desats during meals, med pass, and with activity. Encouraged to prone, however she does not tolerate this well and is only able to stay prone for an hour at a time. IS and flutter valve provided.

## 2018-12-28 NOTE — Progress Notes (Signed)
Patient with sats ranging from 82-88% on NRB, she had been on the phone with her son who is also covid+ and is now heading to the ER due to shortness of breath. Patient extremely anxious about this and also about her 2 dogs that will be left home alone if her son gets admitted. She is wanting someone to arrange for animal control (a lady named Glenetta Borg?) to pick up her dogs and keep them until they are able to pick them up. I told her that she needed to focus on her breathing and we would first see if her son was going to be admitted or not, and then we would figure something out. It took a while but patients anxiety level seemed to decrease somewhat and she was maintaining 87-90% on the NRB. Will continue to monitor. ICU charge aware of patients potential to need further respiratory interventions.

## 2018-12-28 NOTE — Progress Notes (Signed)
PROGRESS NOTE    Kristin Estrada  [id number] DOB: [birth year] DOA: 12/29/2018 PCP: Frazier Richards, MD   Brief Narrative:  78 y.o. WF PMHx Fibromyalgia, chronic pain syndrome (back), COPD, diabetes type 2 uncontrolled with complication, HLD,, chronic diastolic CHF, left breast cancer ER positive (negative XRT/chemotherapy)  Sent to ED for sob and hypoxia.  She has been worsening for several days with a lot of coughing.  No cp or abd pain.  No n/v/d.  dound to have covid pna and referred for admission for such. Feeling better on nrb.  Was prev on bipap now off.  mentating normally at this time.   Subjective: 11/11 A/O x4, negative CP, negative abdominal pain.  Positive S OB.  States her son also Covid positive on his way to be hospitalized.  Positive history of smoking 2 packs/day x 20 years stopped 2006.  Not on home O2.  Not on CPAP.  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   Primary cancer of lower outer quadrant of left female breast ([place])   Acute respiratory failure due to COVID-19 [facility])   Acute respiratory failure with hypoxia and hypercarbia (HCC)   Chronic diastolic CHF (congestive heart failure) (HCC)   HLD (hyperlipidemia)   Diabetes mellitus type 2, uncontrolled, with complications (HCC)   Covid pneumonia/acute respiratory failure with hypoxia COVID-19 Labs  Recent Labs    12/28/2018 0300 12/27/18 0353 12/28/18 0120  DDIMER  --  0.56* 0.62*  FERRITIN 416*  --   --   LDH 449*  --   --   CRP 8.8* 11.9* 6.2*  -Combivent QID -Discontinue Solu-Medrol changed to Decadron 6 mg daily -Remdesivir per pharmacy protocol -Flutter valve -Prone patient 16 hours/day, if patient unable to tolerate prone patient 2 to 3 hours per shift.  No results found for: SARSCOV2NAA pending  COPD -See Covid  pneumonia  Chronic diastolic CHF -We will slowly restart home CHF medication -Amlodipine 5 mg daily -11/11 Toprol 25 mg daily -Echocardiogram pending  Essential HTN -See CHF  Diabetes type 2 uncontrolled with complication -[id number] hemoglobin A1c= 7.1 -Lantus 8 units daily -Resistant SSI -Lipid panel pending  HLD -See diabetes  Glaucoma -Liquifilm Tears    DVT prophylaxis: Lovenox per pharmacy Covid protocol Code Status: Full Family Communication:  Disposition Plan: TBD   Consultants:  None  Procedures/Significant Events:     I have personally reviewed and interpreted all radiology studies and my findings are as above.  VENTILATOR SETTINGS: Nonrebreather SPO2 86%   Cultures 11/11 SARS coronavirus pending 11/11 respiratory virus panel pending    Antimicrobials: Anti-infectives (From admission, onward)   Start     Stop   12/28/18 1000  remdesivir 100 mg in sodium chloride 0.9 % 250 mL IVPB  Status:  Discontinued     12/27/18 1320   12/27/18 1600  remdesivir 100 mg in sodium chloride 0.9 % 250 mL IVPB     12/31/18 1559       Devices    LINES / TUBES:      Continuous Infusions:  sodium chloride     remdesivir 100 mg in NS 250 mL Stopped (12/27/18 1640)     Objective: Vitals:   12/28/18 0400 12/28/18 0500 12/28/18 0600 12/28/18 0601  BP: (!) 126/58 (!) 104/49 (!) 148/72   Pulse: 91  77 87 92  Resp: (!) 22 (!) 22 (!) 26 (!) 22  Temp:      TempSrc:      SpO2: 90% (!) 86% (!) 84% (!) 86%  Weight:      Height:        Intake/Output Summary (Last 24 hours) at 12/28/2018 0758 Last data filed at 12/28/2018 0600 Gross per 24 hour  Intake 1210.14 ml  Output 800 ml  Net 410.14 ml   Filed Weights   01/16/2019 2200  Weight: 80.6 kg    Examination:  General: A/O x4, positive acute respiratory distress Eyes: negative scleral hemorrhage, negative anisocoria, negative icterus ENT: Negative Runny nose, negative gingival bleeding, Neck:   Negative scars, masses, torticollis, lymphadenopathy, JVD Lungs: Tachypneic, decreased bibasilar breath sounds, wthout wheezes or crackles Cardiovascular: Regular rate and rhythm without murmur gallop or rub normal S1 and S2 Abdomen: negative abdominal pain, nondistended, positive soft, bowel sounds, no rebound, no ascites, no appreciable mass Extremities: No significant cyanosis, clubbing, or edema bilateral lower extremities Skin: Negative rashes, lesions, ulcers Psychiatric:  Negative depression, negative anxiety, negative fatigue, negative mania  Central nervous system:  Cranial nerves II through XII intact, tongue/uvula midline, all extremities muscle strength 5/5, sensation intact throughout, negative dysarthria, negative expressive aphasia, negative receptive aphasia.  .     Data Reviewed: Care during the described time interval was provided by me .  I have reviewed this patient's available data, including medical history, events of note, physical examination, and all test results as part of my evaluation.   CBC: Recent Labs  Lab 01/13/2019 0238 12/27/18 0353 12/28/18 0120  WBC 14.0* 10.2 11.9*  NEUTROABS 11.4* 8.0* 9.2*  HGB 13.5 13.8 14.8  HCT 40.9 42.9 46.2*  MCV 88.1 89.7 90.6  PLT 293 358 [id number]*   Basic Metabolic Panel: Recent Labs  Lab 01/04/2019 0300 12/27/18 0353 12/28/18 0120  NA 140 142 138  K 4.4 3.5 4.2  CL 103 97* 96*  CO2 25 31 29   GLUCOSE 119* 214* 291*  BUN 33* 38* 48*  CREATININE 0.82 0.76 0.82  CALCIUM 8.8* 8.5* 8.8*   GFR: Estimated Creatinine Clearance: 53.8 mL/min (by C-G formula based on SCr of 0.82 mg/dL). Liver Function Tests: Recent Labs  Lab 12/29/2018 0300 12/27/18 0353 12/28/18 0120  AST 29 21 17   ALT 21 23 22   ALKPHOS 66 74 79  BILITOT 0.6 1.1 0.5  PROT 7.0 7.1 7.1  ALBUMIN 3.2* 3.2* 3.1*   No results for input(s): LIPASE, AMYLASE in the last 168 hours. No results for input(s): AMMONIA in the last 168 hours. Coagulation  Profile: No results for input(s): INR, PROTIME in the last 168 hours. Cardiac Enzymes: No results for input(s): CKTOTAL, CKMB, CKMBINDEX, TROPONINI in the last 168 hours. BNP (last 3 results) No results for input(s): PROBNP in the last 8760 hours. HbA1C: No results for input(s): HGBA1C in the last 72 hours. CBG: No results for input(s): GLUCAP in the last 168 hours. Lipid Profile: Recent Labs    12/20/2018 0300  TRIG 170*   Thyroid Function Tests: No results for input(s): TSH, T4TOTAL, FREET4, T3FREE, THYROIDAB in the last 72 hours. Anemia Panel: Recent Labs    12/31/2018 0300  FERRITIN 416*   Urine analysis: No results found for: COLORURINE, APPEARANCEUR, LABSPEC, PHURINE, GLUCOSEU, HGBUR, BILIRUBINUR, KETONESUR, PROTEINUR, UROBILINOGEN, NITRITE, LEUKOCYTESUR Sepsis Labs: @LABRCNTIP (procalcitonin:4,lacticidven:4)  ) Recent Results (from the past 240 hour(s))  Blood Culture (routine x 2)     Status: None (Preliminary result)  Collection Time: 12/22/2018  2:38 AM   Specimen: BLOOD  Result Value Ref Range Status   Specimen Description BLOOD LEFT FOREARM  Final   Special Requests   Final    BOTTLES DRAWN AEROBIC AND ANAEROBIC Blood Culture adequate volume   Culture   Final    NO GROWTH 1 DAY Performed at [facility], [address] [place], [place] [address]    Report Status PENDING  Incomplete  Blood Culture (routine x 2)     Status: None (Preliminary result)   Collection Time: 12/27/2018  2:38 AM   Specimen: BLOOD  Result Value Ref Range Status   Specimen Description BLOOD RIGHT HAND  Final   Special Requests   Final    BOTTLES DRAWN AEROBIC AND ANAEROBIC Blood Culture adequate volume   Culture   Final    NO GROWTH 1 DAY Performed at [facility], [address], [place], [place] [address]    Report Status PENDING  Incomplete         Radiology Studies: Ct Angio Chest Pe W Or Wo Contrast  Result Date: 01/11/2019 CLINICAL DATA:  Cough and  shortness of breath for the past week. Fever as high as 102. Chest tightness. Loss of smell and taste. Status post left mastectomy for breast cancer in 2019. EXAM: CT ANGIOGRAPHY CHEST WITH CONTRAST TECHNIQUE: Multidetector CT imaging of the chest was performed using the standard protocol during bolus administration of intravenous contrast. Multiplanar CT image reconstructions and MIPs were obtained to evaluate the vascular anatomy. CONTRAST:  80mL OMNIPAQUE IOHEXOL 350 MG/ML SOLN COMPARISON:  Portable chest obtained earlier today. FINDINGS: Cardiovascular: Enlarged heart. Normally opacified pulmonary arteries with no pulmonary arterial filling defects seen. Mediastinum/Nodes: Mildly enlarged subcarinal nodes measuring up to 15 mm in short axis diameter on image number 44 series 4. The hilar and remainder of the mediastinal nodes are normal in size. No axillary adenopathy seen. Normal appearing thyroid gland. No visible esophageal abnormality. Lungs/Pleura: Marked prominence of the interstitial markings throughout the majority of both lungs with associated diffuse airspace opacities in the lower lung zones. No pleural fluid seen. 4 mm average diameter nodule in the anterior aspect of the left lower lobe on image number 54 series 6. There is also a 9 x 8 mm nodule in the right middle lobe on image number 62 series 6, a 7 x 6 mm nodule in the right lower lobe on image number 60 of series 6, an 8 x 7 mm nodule in the right upper lobe on image number 35 of series 6 and multiple additional smaller bilateral lung nodules. Mild bilateral bullous changes in a centrilobular pattern. Upper Abdomen: Small hypervascular area in the superior aspect of the spleen compatible with a flash filling hemangioma and 2 small oval areas of low density in the lateral segment of the left lobe of the liver. Mild diffuse low density of the liver relative to the spleen. Musculoskeletal: Left shoulder prosthesis. Thoracic and lower cervical  spine degenerative changes. Review of the MIP images confirms the above findings. IMPRESSION: 1. No pulmonary emboli. 2. Marked bilateral interstitial lung disease with associated alveolar opacities, concerning for bilateral pneumonia and pneumonitis based on the patient's symptoms. Interstitial pulmonary edema is less likely in the absence pleural fluid. 3. Multiple bilateral lung nodules, as described above. These are suspicious for metastases given the recent history of breast cancer. 4. Subcarinal adenopathy. This is most likely reactive. Metastatic adenopathy is also a possibility. 5. Two small oval  areas of low density in the lateral segment of the left lobe of the liver. These could represent cysts, meningiomas or metastases. 6. Cardiomegaly. 7. Mild changes of COPD with centrilobular emphysema. 8. Mild diffuse hepatic steatosis. Emphysema (ICD10-J43.9). Electronically Signed   By: Claudie Revering M.D.   On: 01/02/2019 12:01        Scheduled Meds:  amLODipine  5 mg Oral Daily   famotidine  20 mg Oral BID   influenza vaccine adjuvanted  0.5 mL Intramuscular Tomorrow-1000   Ipratropium-Albuterol  1 puff Inhalation Q6H   levothyroxine  50 mcg Oral QAC breakfast   methylPREDNISolone (SOLU-MEDROL) injection  80 mg Intravenous Q12H   pneumococcal 23 valent vaccine  0.5 mL Intramuscular Tomorrow-1000   sodium chloride flush  3 mL Intravenous Q12H   vitamin C  500 mg Oral Daily   zinc sulfate  220 mg Oral Daily   Continuous Infusions:  sodium chloride     remdesivir 100 mg in NS 250 mL Stopped (12/27/18 1640)     LOS: 2 days   The patient is critically ill with multiple organ systems failure and requires high complexity decision making for assessment and support, frequent evaluation and titration of therapies, application of advanced monitoring technologies and extensive interpretation of multiple databases. Critical Care Time devoted to patient care services described in this note   Time spent: 40 minutes     Zekiel Torian, Geraldo Docker, MD [facility] Pager [phone]  If 7PM-7AM, please contact night-coverage [email or web address] Password [facility] 12/28/2018, 7:58 AM

## 2018-12-28 NOTE — Progress Notes (Signed)
ANTICOAGULATION CONSULT NOTE - Initial Consult  Pharmacy Consult for Lovenox Indication: VTE prophylaxis  Allergies  Allergen Reactions  . Protonix [Pantoprazole Sodium] Other (See Comments)    abd cramps  . Prednisone Other (See Comments)    Agitation, insomina, mood change. Tolerates injection- not PO Agitation, insomina, mood change. Tolerates injection- not PO  . Statins Other (See Comments)    Legs hurt Legs hurt Legs hurt    Patient Measurements: Height: 5\' 2"  (157.5 cm) Weight: 177 lb 11.1 oz (80.6 kg) IBW/kg (Calculated) : 50.1  Vital Signs: Temp: 99.1 F (37.3 C) (11/11 1308) Temp Source: Axillary (11/11 1308) BP: 145/77 (11/11 1308) Pulse Rate: 92 (11/11 1308)  Labs: Recent Labs    01/01/2019 0238 12/24/2018 0300 12/27/18 0353 12/28/18 0120  HGB 13.5  --  13.8 14.8  HCT 40.9  --  42.9 46.2*  PLT 293  --  358 403*  CREATININE  --  0.82 0.76 0.82  TROPONINIHS  --  10  --   --     Estimated Creatinine Clearance: 53.8 mL/min (by C-G formula based on SCr of 0.82 mg/dL).   Medical History: Past Medical History:  Diagnosis Date  . Arthritis   . Bronchitis   . Bruises easily   . Cancer (HCC)    breast  . Cataracts, bilateral   . Chronic airway obstruction, not elsewhere classified    Symbicort daily and Albuterol daily as needed  . Chronic back pain    arthritis  . Chronic bronchitis ([place])   . Cramps of left lower extremity   . Diabetes mellitus without complication ([place])    borderline-diet and exercise;takes Cinnamon daily  . Esophageal reflux    takes Nexium daily and Dexilant daily as needed  . Fibromyalgia   . Fibromyalgia   . Headache    occasionally  . HOH (hard of hearing)   . Hyperlipidemia    takes CO Q10 daily  . Hypothyroidism   . Insomnia    takes Ambien and Trazodone nightly  . Joint pain   . Joint swelling   . Macular degeneration, right eye    wet and to get injection tomorrow  . Pneumonia    hx of x 3 with most recent one  2009  . Seasonal allergies    takes Zyrtec daily as well as using Nasonex  . Shortness of breath dyspnea   . Shoulder fracture   . Unspecified essential hypertension    takes Amlodipine and Losartan daily  . URI (upper respiratory infection)    was on Prednisone and Antibiotic-to complete today  . Urinary urgency     Medications:  Medications Prior to Admission  Medication Sig Dispense Refill Last Dose  . acetaminophen (TYLENOL) 500 MG tablet Take 500-1,000 mg by mouth every 6 (six) hours as needed for moderate pain or headache.    unk  . albuterol (PROAIR HFA) 108 (90 BASE) MCG/ACT inhaler Inhale 2 puffs into the lungs every 4 (four) hours as needed. (Patient taking differently: Inhale 2 puffs into the lungs every 4 (four) hours as needed for wheezing or shortness of breath. ) 1 Inhaler 3 12/24/2018  . amLODipine (NORVASC) 5 MG tablet Take 5 mg by mouth daily.   12/24/2018  . Bevacizumab (AVASTIN) 100 MG/4ML SOLN Place 1 Dose into both eyes every 30 (thirty) days.    unk  . budesonide-formoterol (SYMBICORT) 160-4.5 MCG/ACT inhaler Inhale 2 puffs into the lungs 2 (two) times daily.   12/24/2018  . cetirizine (  ZYRTEC) 10 MG tablet Take 10 mg by mouth at bedtime.    12/24/2018  . cholecalciferol (VITAMIN D) 1000 UNITS tablet Take 1,000 Units by mouth daily.   12/24/2018  . clotrimazole (LOTRIMIN) 1 % cream Apply 1 application topically 2 (two) times daily as needed (for yeast infection).   unk  . Coenzyme Q10 (CO Q 10) 10 MG CAPS Take 10 mg by mouth daily.    12/24/2018  . cyclobenzaprine (FLEXERIL) 5 MG tablet Take 5 mg by mouth 3 (three) times daily as needed for muscle spasms.   Past Week at Unknown time  . diazepam (VALIUM) 5 MG tablet Take 0.5-1 tablets (2.5-5 mg total) by mouth every 6 (six) hours as needed for muscle spasms or sedation. (Patient taking differently: Take 5 mg by mouth at bedtime. ) 40 tablet 1 12/24/2018  . diclofenac (VOLTAREN) 75 MG EC tablet Take 75 mg by mouth 2 (two)  times daily.     12/24/2018  . esomeprazole (NEXIUM) 40 MG capsule Take 40 mg by mouth daily at 12 noon.   12/23/2018  . Homeopathic Products ([facility] LEG CRAMPS PAIN RELIEF PO) Take 2 tablets by mouth every 4 (four) hours as needed (for leg cramps).    unk  . Ipratropium-Albuterol (COMBIVENT) 20-100 MCG/ACT AERS respimat Inhale 1 puff into the lungs every 6 (six) hours.   12/24/2018  . levofloxacin (LEVAQUIN) 500 MG tablet Take 500 mg by mouth daily. For 7 days  Started on 12-19-18   12/24/2018  . levothyroxine (SYNTHROID) 50 MCG tablet Take 50 mcg by mouth daily before breakfast.    12/24/2018  . losartan (COZAAR) 100 MG tablet Take 100 mg by mouth daily.    12/24/2018  . Magnesium 400 MG CAPS Take 400 mg by mouth See admin instructions. Take 400 mg by mouth in the morning every day and take 400 mg by mouth 3 times weekly at bedtime   12/23/2018  . metoprolol succinate (TOPROL-XL) 25 MG 24 hr tablet Take 25 mg by mouth daily.   12/24/2018 at 01000  . Misc Natural Products (GLUCOSAMINE CHOND DOUBLE STR PO) Take 1 tablet by mouth daily.   12/23/2018  . Multiple Vitamin (MULTIVITAMIN WITH MINERALS) TABS tablet Take 1 tablet by mouth daily.   Past Week at Unknown time  . Multiple Vitamins-Minerals (ICAPS) CAPS Take 1 capsule by mouth daily.   12/24/2018  . nystatin (MYCOSTATIN) 100000 UNIT/ML suspension Take 5 mLs (500,000 Units total) by mouth as needed. (Patient taking differently: Take 5 mLs by mouth daily as needed (for mouth due to inhaler). ) 60 mL 5 unk  . Polyethyl Glycol-Propyl Glycol (SYSTANE OP) Place 1 drop into both eyes 3 (three) times daily as needed (for dry eyes (at least once daily)).    Past Week at Unknown time  . [EXPIRED] predniSONE (DELTASONE) 20 MG tablet Take 10-60 mg by mouth See admin instructions. Take 3 tablets (60mg ) daily for 2 days, 2 tablets (40mg ) by mouth daily for 2 days, 1 tablet (20mg ) by mouth daily for 2 days then take  tablet (10mg ) by mouth daily for 4 days   12/24/2018  .  vitamin B-12 (CYANOCOBALAMIN) 1000 MCG tablet Take 1,000 mcg by mouth daily.   12/23/2018  . vitamin C (ASCORBIC ACID) 500 MG tablet Take 500 mg by mouth daily.   12/23/2018  . famotidine (PEPCID) 20 MG tablet Take 1 tablet (20 mg total) by mouth 2 (two) times daily. When coughing (Patient not taking: Reported on 12/25/2018)  60 tablet 6 Not Taking at Unknown time  . furosemide (LASIX) 10 MG/ML injection Inject 6 mLs (60 mg total) into the vein 2 (two) times daily. 4 mL 0   . methylPREDNISolone sodium succinate (SOLU-MEDROL) 125 mg/2 mL injection Inject 0.96 mLs (60 mg total) into the vein every 6 (six) hours. 1 each 0   . tamoxifen (NOLVADEX) 20 MG tablet Take 1 tablet (20 mg total) by mouth daily. (Patient not taking: Reported on 12/31/2018) 30 tablet 0 Not Taking at Unknown time    Assessment: 40 YOF with COVID-19. Pharmacy consulted to ensure patient is on prophylactic Lovenox per COVID-protocol. BMI < 35, D-dimer < 5, SCr wnl. Currently on appropriate dose of Lovenox 40 mg daily   Goal of Therapy:   Monitor platelets by anticoagulation protocol: Yes   Plan:  -Continue Lovenox 40 mg daily  -Monitor d-dimer trend -Monitor SCr and CBC  -Pharmacy to sign off and monitor peripherally   Albertina Parr, PharmD., BCPS Clinical Pharmacist Clinical phone for 12/28/18 until 5pm: [phone]

## 2018-12-29 ENCOUNTER — Inpatient Hospital Stay (HOSPITAL_COMMUNITY): Payer: PPO

## 2018-12-29 DIAGNOSIS — R0602 Shortness of breath: Secondary | ICD-10-CM

## 2018-12-29 LAB — CBC WITH DIFFERENTIAL/PLATELET
Abs Immature Granulocytes: 0.69 10*3/uL — ABNORMAL HIGH (ref 0.00–0.07)
Basophils Absolute: 0.1 10*3/uL (ref 0.0–0.1)
Basophils Relative: 1 %
Eosinophils Absolute: 0 10*3/uL (ref 0.0–0.5)
Eosinophils Relative: 0 %
HCT: 46 % (ref 36.0–46.0)
Hemoglobin: 14.6 g/dL (ref 12.0–15.0)
Immature Granulocytes: 4 %
Lymphocytes Relative: 12 %
Lymphs Abs: 2.3 10*3/uL (ref 0.7–4.0)
MCH: 28.9 pg (ref 26.0–34.0)
MCHC: 31.7 g/dL (ref 30.0–36.0)
MCV: 91.1 fL (ref 80.0–100.0)
Monocytes Absolute: 1.6 10*3/uL — ABNORMAL HIGH (ref 0.1–1.0)
Monocytes Relative: 8 %
Neutro Abs: 14.3 10*3/uL — ABNORMAL HIGH (ref 1.7–7.7)
Neutrophils Relative %: 75 %
Platelets: 446 10*3/uL — ABNORMAL HIGH (ref 150–400)
RBC: 5.05 MIL/uL (ref 3.87–5.11)
RDW: 14.6 % (ref 11.5–15.5)
WBC: 19.1 10*3/uL — ABNORMAL HIGH (ref 4.0–10.5)
nRBC: 0 % (ref 0.0–0.2)

## 2018-12-29 LAB — RESPIRATORY PANEL BY PCR

## 2018-12-29 LAB — MAGNESIUM
Magnesium: 2.6 mg/dL — ABNORMAL HIGH (ref 1.7–2.4)
Magnesium: 2.3 mg/dL (ref 1.7–2.4)

## 2018-12-29 LAB — SARS CORONAVIRUS 2 (TAT 6-24 HRS): SARS Coronavirus 2: POSITIVE — AB

## 2018-12-29 LAB — COMPREHENSIVE METABOLIC PANEL
ALT: 20 U/L (ref 0–44)
AST: 18 U/L (ref 15–41)
Albumin: 2.9 g/dL — ABNORMAL LOW (ref 3.5–5.0)
Alkaline Phosphatase: 75 U/L (ref 38–126)
Anion gap: 12 (ref 5–15)
BUN: 47 mg/dL — ABNORMAL HIGH (ref 8–23)
CO2: 31 mmol/L (ref 22–32)
Calcium: 8.8 mg/dL — ABNORMAL LOW (ref 8.9–10.3)
Chloride: 99 mmol/L (ref 98–111)
Creatinine, Ser: 0.71 mg/dL (ref 0.44–1.00)
GFR calc Af Amer: >60 mL/min (ref >60)
GFR calc non Af Amer: >60 mL/min (ref >60)
Glucose, Bld: 71 mg/dL (ref 70–99)
Potassium: 3.1 mmol/L — ABNORMAL LOW (ref 3.5–5.1)
Sodium: 142 mmol/L (ref 135–145)
Total Bilirubin: 0.4 mg/dL (ref 0.3–1.2)
Total Protein: 6.7 g/dL (ref 6.5–8.1)

## 2018-12-29 LAB — LIPID PANEL
Cholesterol: 218 mg/dL — ABNORMAL HIGH (ref 0–200)
HDL: 25 mg/dL — ABNORMAL LOW (ref >40)
LDL Cholesterol: 145 mg/dL — ABNORMAL HIGH (ref 0–99)
Total CHOL/HDL Ratio: 8.7 RATIO
Triglycerides: 238 mg/dL — ABNORMAL HIGH (ref <150)
VLDL: 48 mg/dL — ABNORMAL HIGH (ref 0–40)

## 2018-12-29 LAB — GLUCOSE, CAPILLARY
Glucose-Capillary: 262 mg/dL — ABNORMAL HIGH (ref 70–99)
Glucose-Capillary: 87 mg/dL (ref 70–99)
Glucose-Capillary: 91 mg/dL (ref 70–99)
Glucose-Capillary: 239 mg/dL — ABNORMAL HIGH (ref 70–99)
Glucose-Capillary: 152 mg/dL — ABNORMAL HIGH (ref 70–99)

## 2018-12-29 LAB — C-REACTIVE PROTEIN: CRP: 2.6 mg/dL — ABNORMAL HIGH (ref <1.0)

## 2018-12-29 LAB — FERRITIN: Ferritin: 328 ng/mL — ABNORMAL HIGH (ref 11–307)

## 2018-12-29 LAB — PHOSPHORUS: Phosphorus: 2.3 mg/dL — ABNORMAL LOW (ref 2.5–4.6)

## 2018-12-29 LAB — LACTATE DEHYDROGENASE: LDH: 467 U/L — ABNORMAL HIGH (ref 98–192)

## 2018-12-29 LAB — POTASSIUM: Potassium: 4.4 mmol/L (ref 3.5–5.1)

## 2018-12-29 LAB — D-DIMER, QUANTITATIVE: D-Dimer, Quant: 0.92 ug/mL-FEU — ABNORMAL HIGH (ref 0.00–0.50)

## 2018-12-29 MED ORDER — FUROSEMIDE 10 MG/ML IJ SOLN
60.0000 mg | Freq: Once | INTRAMUSCULAR | Status: AC
  Administered 2018-12-29: 60 mg via INTRAVENOUS
  Filled 2018-12-29: qty 6

## 2018-12-29 MED ORDER — POTASSIUM CHLORIDE CRYS ER 20 MEQ PO TBCR
50.0000 meq | EXTENDED_RELEASE_TABLET | Freq: Once | ORAL | Status: AC
  Administered 2018-12-29: 09:00:00 50 meq via ORAL
  Filled 2018-12-29: qty 3

## 2018-12-29 MED ORDER — SODIUM CHLORIDE 0.9% IV SOLUTION: Freq: Once | INTRAVENOUS | Status: DC

## 2018-12-29 MED ORDER — TOCILIZUMAB 400 MG/20ML IV SOLN
8.0000 mg/kg | Freq: Once | INTRAVENOUS | Status: AC
  Administered 2018-12-29: 14:00:00 644 mg via INTRAVENOUS
  Filled 2018-12-29: qty 32.2

## 2018-12-29 NOTE — Progress Notes (Addendum)
PROGRESS NOTE    Kristin Estrada  [id number] DOB: 1940-03-15 DOA: 12/24/2018 PCP: Frazier Richards, MD   Brief Narrative:  78 y.o. WF PMHx Fibromyalgia, chronic pain syndrome (back), COPD, diabetes type 2 uncontrolled with complication, HLD,, chronic diastolic CHF, left breast cancer ER positive (negative XRT/chemotherapy)  Sent to ED for sob and hypoxia.  She has been worsening for several days with a lot of coughing.  No cp or abd pain.  No n/v/d.  dound to have covid pna and referred for admission for such. Feeling better on nrb.  Was prev on bipap now off.  mentating normally at this time.   Subjective: 11/12 A/O x4, negative CP, negative abdominal pain.  Positive increase S OB, positive increased WOB.    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   Primary cancer of lower outer quadrant of left female breast ([place])   Acute respiratory failure due to COVID-19 [facility])   Acute respiratory failure with hypoxia and hypercarbia (HCC)   Chronic diastolic CHF (congestive heart failure) (HCC)   HLD (hyperlipidemia)   Diabetes mellitus type 2, uncontrolled, with complications (HCC)   Covid pneumonia/acute respiratory failure with hypoxia COVID-19 Labs  Recent Labs    12/27/18 0353 12/28/18 0120 12/29/18 0330  DDIMER 0.56* 0.62* 0.92*  FERRITIN  --  397* 328*  LDH  --  443* 467*  CRP 11.9* 6.2* 2.6*  -Combivent QID -Discontinue Solu-Medrol changed to Decadron 6 mg daily -Remdesivir per pharmacy protocol -Flutter valve -Prone patient 16 hours/day, if patient unable to tolerate prone patient 2 to 3 hours per shift. -11/12 Actemra x1 dose -11/12 transfuse 1 unit Covid convalescent plasma  Lab Results  Component Value Date   SARSCOV2NAA POSITIVE (A) 12/28/2018   pending  COPD -Secondary to long history of smoking -See Covid  pneumonia  Chronic diastolic CHF -We will slowly restart home CHF medication -Amlodipine 5 mg daily -11/11 Toprol 25 mg daily -Echocardiogram grade 1 diastolic dysfunction see results below -Strict in and out + 200.30ml -Daily weight Filed Weights   01/11/2019 2200  Weight: 80.6 kg  -Lasix IV 60 mg x 1  Essential HTN -See CHF  Diabetes type 2 uncontrolled with complication -[id number] hemoglobin A1c= 7.1 -Lantus 8 units daily -Resistant SSI -Lipid panel pending  HLD -See diabetes  Glaucoma -Liquifilm Tears  Hypokalemia -Potassium goal> 4 -K-Dur 50 mEq    DVT prophylaxis: Lovenox per pharmacy Covid protocol Code Status: Full Family Communication: 11/12 attempted to call [name] (son) received a message that call could not be completed. Disposition Plan: TBD    Consultants:  None   Procedures/Significant Events:  11/11 echocardiogram;Left Ventricle: EF = 65 to 70%.  -Grade I diastolic dysfunction (impaired relaxation).  11/12 PCXR;I-Persistent prominent bilateral pulmonary interstitial prominence consistent with active interstitial process including pneumonitis and/or interstitial edema, slightly (I believe appear slightly worse than previous exam) 11/12 transfuse 1 unit Covid convalescent plasma     I have personally reviewed and interpreted all radiology studies and my findings are as above.  VENTILATOR SETTINGS: Nonrebreather Flow; 15 L/min in the prone position SPO2; 96%    Cultures 11/11 SARS coronavirus positive 11/11 respiratory virus panel negative      Antimicrobials: Anti-infectives (From admission, onward)   Start     Stop   12/28/18 1000  remdesivir 100 mg in sodium chloride 0.9 % 250  mL IVPB  Status:  Discontinued     12/27/18 1320   12/27/18 1600  remdesivir 100 mg in sodium chloride 0.9 % 250 mL IVPB     12/31/18 1559       Devices    LINES / TUBES:      Continuous Infusions: . sodium chloride    . remdesivir 100 mg in NS  250 mL Stopped (12/28/18 1620)     Objective: Vitals:   12/28/18 1959 12/29/18 0423 12/29/18 0427 12/29/18 0743  BP: (!) 153/66 (!) 143/75    Pulse: 80 75 73   Resp: (!) 23 (!) 25 19   Temp: 99.2 F (37.3 C)   97.7 F (36.5 C)  TempSrc: Oral   Oral  SpO2: 97% (!) 87% 90%   Weight:      Height:       No intake or output data in the 24 hours ending 12/29/18 0839 Filed Weights   12/29/2018 2200  Weight: 80.6 kg   Physical Exam:  General: A/O x4, positive INCREASING acute respiratory distress Eyes: negative scleral hemorrhage, negative anisocoria, negative icterus ENT: Negative Runny nose, negative gingival bleeding, Neck:  Negative scars, masses, torticollis, lymphadenopathy, JVD Lungs: Tachypneic, decreased bibasilar breath sounds, positive bibasilar crackles, negative wheeze  Cardiovascular: Tachycardic, without murmur gallop or rub normal S1 and S2 Abdomen: negative abdominal pain, nondistended, positive soft, bowel sounds, no rebound, no ascites, no appreciable mass Extremities: No significant cyanosis, clubbing, or edema bilateral lower extremities Skin: Negative rashes, lesions, ulcers Psychiatric:  Negative depression, negative anxiety, negative fatigue, negative mania  Central nervous system:  Cranial nerves II through XII intact, tongue/uvula midline, all extremities muscle strength 5/5, sensation intact throughout, negative dysarthria, negative expressive aphasia, negative receptive aphasia.  .     Data Reviewed: Care during the described time interval was provided by me .  I have reviewed this patient's available data, including medical history, events of note, physical examination, and all test results as part of my evaluation.   CBC: Recent Labs  Lab 01/08/2019 0238 12/27/18 0353 12/28/18 0120 12/29/18 0330  WBC 14.0* 10.2 11.9* 19.1*  NEUTROABS 11.4* 8.0* 9.2* 14.3*  HGB 13.5 13.8 14.8 14.6  HCT 40.9 42.9 46.2* 46.0  MCV 88.1 89.7 90.6 91.1  PLT 293 358  403* [id number]*   Basic Metabolic Panel: Recent Labs  Lab 12/25/2018 0300 12/27/18 0353 12/28/18 0120 12/29/18 0330  NA 140 142 138 142  K 4.4 3.5 4.2 3.1*  CL 103 97* 96* 99  CO2 25 31 29 31   GLUCOSE 119* 214* 291* 71  BUN 33* 38* 48* 47*  CREATININE 0.82 0.76 0.82 0.71  CALCIUM 8.8* 8.5* 8.8* 8.8*  MG  --   --  2.7* 2.6*  PHOS  --   --  2.8 2.3*   GFR: Estimated Creatinine Clearance: 55.2 mL/min (by C-G formula based on SCr of 0.71 mg/dL). Liver Function Tests: Recent Labs  Lab 12/25/2018 0300 12/27/18 0353 12/28/18 0120 12/29/18 0330  AST 29 21 17 18   ALT 21 23 22 20   ALKPHOS 66 74 79 75  BILITOT 0.6 1.1 0.5 0.4  PROT 7.0 7.1 7.1 6.7  ALBUMIN 3.2* 3.2* 3.1* 2.9*   No results for input(s): LIPASE, AMYLASE in the last 168 hours. No results for input(s): AMMONIA in the last 168 hours. Coagulation Profile: No results for input(s): INR, PROTIME in the last 168 hours. Cardiac Enzymes: No results for input(s): CKTOTAL, CKMB, CKMBINDEX, TROPONINI in the last 168 hours.  BNP (last 3 results) No results for input(s): PROBNP in the last 8760 hours. HbA1C: Recent Labs    12/28/18 0120  HGBA1C 7.1*   CBG: Recent Labs  Lab 12/28/18 1154 12/28/18 2340 12/29/18 0405  GLUCAP 433* 124* 87   Lipid Profile: Recent Labs    12/28/18 0400 12/29/18 0330  CHOL 238* 218*  HDL 33* 25*  LDLCALC 166* 145*  TRIG 194* 238*  CHOLHDL 7.2 8.7   Thyroid Function Tests: No results for input(s): TSH, T4TOTAL, FREET4, T3FREE, THYROIDAB in the last 72 hours. Anemia Panel: Recent Labs    12/28/18 0120 12/29/18 0330  FERRITIN 397* 328*   Urine analysis: No results found for: COLORURINE, APPEARANCEUR, LABSPEC, PHURINE, GLUCOSEU, HGBUR, BILIRUBINUR, KETONESUR, PROTEINUR, UROBILINOGEN, NITRITE, LEUKOCYTESUR Sepsis Labs: @LABRCNTIP (procalcitonin:4,lacticidven:4)  ) Recent Results (from the past 240 hour(s))  Blood Culture (routine x 2)     Status: None (Preliminary result)    Collection Time: 01/04/2019  2:38 AM   Specimen: BLOOD  Result Value Ref Range Status   Specimen Description BLOOD LEFT FOREARM  Final   Special Requests   Final    BOTTLES DRAWN AEROBIC AND ANAEROBIC Blood Culture adequate volume   Culture   Final    NO GROWTH 2 DAYS Performed at [facility], [address] [place], [place] [address]    Report Status PENDING  Incomplete  Blood Culture (routine x 2)     Status: None (Preliminary result)   Collection Time: 12/21/2018  2:38 AM   Specimen: BLOOD  Result Value Ref Range Status   Specimen Description BLOOD RIGHT HAND  Final   Special Requests   Final    BOTTLES DRAWN AEROBIC AND ANAEROBIC Blood Culture adequate volume   Culture   Final    NO GROWTH 2 DAYS Performed at [facility], [address] [place], [place] [address]    Report Status PENDING  Incomplete  SARS CORONAVIRUS 2 (TAT 6-24 HRS) Nasopharyngeal Nasopharyngeal Swab     Status: Abnormal   Collection Time: 12/28/18  8:04 AM   Specimen: Nasopharyngeal Swab  Result Value Ref Range Status   SARS Coronavirus 2 POSITIVE (A) NEGATIVE Final    Comment: RESULT CALLED TO, READ BACK BY AND VERIFIED WITH: RN ERICA DODO AT 0300 BY MESSAN HOUEGNIFIO ON 12/29/2018 (NOTE) SARS-CoV-2 target nucleic acids are DETECTED. The SARS-CoV-2 RNA is generally detectable in upper and lower respiratory specimens during the acute phase of infection. Positive results are indicative of active infection with SARS-CoV-2. Clinical  correlation with patient history and other diagnostic information is necessary to determine patient infection status. Positive results do  not rule out bacterial infection or co-infection with other viruses. The expected result is Negative. Fact Sheet for Patients: SugarRoll.be Fact Sheet for Healthcare Providers: https://www.Teofila Bowery-mathews.com/ This test is not yet approved or cleared by the [place] FDA and   has been authorized for detection and/or diagnosis of SARS-CoV-2 by FDA under an Emergency Use Authorization (EUA). This EUA will remain  in effect (meaning this te st can be used) for the duration of the COVID-19 declaration under Section 564(b)(1) of the Act, 21 U.S.C. section 360bbb-3(b)(1), unless the authorization is terminated or revoked sooner. Performed at [facility], [place] [address] [place], [place] [address]   Respiratory Panel by PCR     Status: None   Collection Time: 12/28/18  8:04 AM   Specimen: Nasopharyngeal Swab; Respiratory  Result Value Ref Range Status   Adenovirus NOT DETECTED NOT  DETECTED Final   Coronavirus 229E NOT DETECTED NOT DETECTED Final    Comment: (NOTE) The Coronavirus on the Respiratory Panel, DOES NOT test for the novel  Coronavirus (2019 nCoV)    Coronavirus HKU1 NOT DETECTED NOT DETECTED Final   Coronavirus NL63 NOT DETECTED NOT DETECTED Final   Coronavirus OC43 NOT DETECTED NOT DETECTED Final   Metapneumovirus NOT DETECTED NOT DETECTED Final   Rhinovirus / Enterovirus NOT DETECTED NOT DETECTED Final   Influenza A NOT DETECTED NOT DETECTED Final   Influenza B NOT DETECTED NOT DETECTED Final   Parainfluenza Virus 1 NOT DETECTED NOT DETECTED Final   Parainfluenza Virus 2 NOT DETECTED NOT DETECTED Final   Parainfluenza Virus 3 NOT DETECTED NOT DETECTED Final   Parainfluenza Virus 4 NOT DETECTED NOT DETECTED Final   Respiratory Syncytial Virus NOT DETECTED NOT DETECTED Final   Bordetella pertussis NOT DETECTED NOT DETECTED Final   Chlamydophila pneumoniae NOT DETECTED NOT DETECTED Final   Mycoplasma pneumoniae NOT DETECTED NOT DETECTED Final    Comment: Performed at [facility], [place] [address], [place], [place] [address]         Radiology Studies: No results found.      Scheduled Meds: . amLODipine  5 mg Oral Daily  . dexamethasone (DECADRON) injection  6 mg Intravenous Q24H  . enoxaparin (LOVENOX) injection  40 mg  Subcutaneous Daily  . famotidine  20 mg Oral BID  . influenza vaccine adjuvanted  0.5 mL Intramuscular Tomorrow-1000  . insulin aspart  0-20 Units Subcutaneous Q4H  . insulin glargine  8 Units Subcutaneous Daily  . Ipratropium-Albuterol  1 puff Inhalation Q6H  . levothyroxine  50 mcg Oral QAC breakfast  . metoprolol succinate  25 mg Oral Daily  . pneumococcal 23 valent vaccine  0.5 mL Intramuscular Tomorrow-1000  . sodium chloride flush  3 mL Intravenous Q12H  . vitamin C  500 mg Oral Daily  . zinc sulfate  220 mg Oral Daily   Continuous Infusions: . sodium chloride    . remdesivir 100 mg in NS 250 mL Stopped (12/28/18 1620)     LOS: 3 days   The patient is critically ill with multiple organ systems failure and requires high complexity decision making for assessment and support, frequent evaluation and titration of therapies, application of advanced monitoring technologies and extensive interpretation of multiple databases. Critical Care Time devoted to patient care services described in this note  Time spent: 40 minutes     Esmond Hinch, Geraldo Docker, MD [facility] Pager [phone]  If 7PM-7AM, please contact night-coverage [email or web address] Password [facility] 12/29/2018, 8:39 AM

## 2018-12-29 NOTE — Progress Notes (Signed)
Patient still requires NRB, increased SOB and work of breathing. Desats so 70 with activity, C/o chest tightness and "breathing too fast". Assisted patient to prone, and notified MD. Received orders for lasix and actemra. Will obtain consent for plasma.

## 2018-12-30 DIAGNOSIS — H409 Unspecified glaucoma: Secondary | ICD-10-CM | POA: Diagnosis present

## 2018-12-30 DIAGNOSIS — H4050X2 Glaucoma secondary to other eye disorders, unspecified eye, moderate stage: Secondary | ICD-10-CM

## 2018-12-30 LAB — D-DIMER, QUANTITATIVE: D-Dimer, Quant: 1.05 ug/mL-FEU — ABNORMAL HIGH (ref 0.00–0.50)

## 2018-12-30 LAB — CBC WITH DIFFERENTIAL/PLATELET
Abs Immature Granulocytes: 0.63 10*3/uL — ABNORMAL HIGH (ref 0.00–0.07)
Basophils Absolute: 0.1 10*3/uL (ref 0.0–0.1)
Basophils Relative: 1 %
Eosinophils Absolute: 0 10*3/uL (ref 0.0–0.5)
Eosinophils Relative: 0 %
HCT: 42.6 % (ref 36.0–46.0)
Hemoglobin: 13.8 g/dL (ref 12.0–15.0)
Immature Granulocytes: 4 %
Lymphocytes Relative: 11 %
Lymphs Abs: 1.6 10*3/uL (ref 0.7–4.0)
MCH: 29.4 pg (ref 26.0–34.0)
MCHC: 32.4 g/dL (ref 30.0–36.0)
MCV: 90.8 fL (ref 80.0–100.0)
Monocytes Absolute: 1.2 10*3/uL — ABNORMAL HIGH (ref 0.1–1.0)
Monocytes Relative: 8 %
Neutro Abs: 11.5 10*3/uL — ABNORMAL HIGH (ref 1.7–7.7)
Neutrophils Relative %: 76 %
Platelets: 394 10*3/uL (ref 150–400)
RBC: 4.69 MIL/uL (ref 3.87–5.11)
RDW: 14.6 % (ref 11.5–15.5)
WBC: 15 10*3/uL — ABNORMAL HIGH (ref 4.0–10.5)
nRBC: 0.1 % (ref 0.0–0.2)

## 2018-12-30 LAB — COMPREHENSIVE METABOLIC PANEL
ALT: 17 U/L (ref 0–44)
AST: 14 U/L — ABNORMAL LOW (ref 15–41)
Albumin: 2.8 g/dL — ABNORMAL LOW (ref 3.5–5.0)
Alkaline Phosphatase: 71 U/L (ref 38–126)
Anion gap: 12 (ref 5–15)
BUN: 42 mg/dL — ABNORMAL HIGH (ref 8–23)
CO2: 29 mmol/L (ref 22–32)
Calcium: 8.3 mg/dL — ABNORMAL LOW (ref 8.9–10.3)
Chloride: 100 mmol/L (ref 98–111)
Creatinine, Ser: 0.59 mg/dL (ref 0.44–1.00)
GFR calc Af Amer: >60 mL/min (ref >60)
GFR calc non Af Amer: >60 mL/min (ref >60)
Glucose, Bld: 116 mg/dL — ABNORMAL HIGH (ref 70–99)
Potassium: 3.9 mmol/L (ref 3.5–5.1)
Sodium: 141 mmol/L (ref 135–145)
Total Bilirubin: 0.7 mg/dL (ref 0.3–1.2)
Total Protein: 6.2 g/dL — ABNORMAL LOW (ref 6.5–8.1)

## 2018-12-30 LAB — PHOSPHORUS: Phosphorus: 2.8 mg/dL (ref 2.5–4.6)

## 2018-12-30 LAB — GLUCOSE, CAPILLARY
Glucose-Capillary: 122 mg/dL — ABNORMAL HIGH (ref 70–99)
Glucose-Capillary: 139 mg/dL — ABNORMAL HIGH (ref 70–99)
Glucose-Capillary: 139 mg/dL — ABNORMAL HIGH (ref 70–99)
Glucose-Capillary: 181 mg/dL — ABNORMAL HIGH (ref 70–99)
Glucose-Capillary: 279 mg/dL — ABNORMAL HIGH (ref 70–99)

## 2018-12-30 LAB — LACTATE DEHYDROGENASE: LDH: 409 U/L — ABNORMAL HIGH (ref 98–192)

## 2018-12-30 LAB — MAGNESIUM: Magnesium: 2.4 mg/dL (ref 1.7–2.4)

## 2018-12-30 LAB — C-REACTIVE PROTEIN: CRP: 7.4 mg/dL — ABNORMAL HIGH (ref <1.0)

## 2018-12-30 LAB — FERRITIN: Ferritin: 268 ng/mL (ref 11–307)

## 2018-12-30 MED ORDER — EZETIMIBE 10 MG PO TABS
10.0000 mg | ORAL_TABLET | Freq: Every day | ORAL | Status: DC
  Administered 2018-12-30 – 2019-01-04 (×6): 10 mg via ORAL
  Filled 2018-12-30 (×7): qty 1

## 2018-12-30 MED ORDER — FUROSEMIDE 10 MG/ML IJ SOLN
60.0000 mg | Freq: Once | INTRAMUSCULAR | Status: AC
  Administered 2018-12-30: 17:00:00 60 mg via INTRAVENOUS
  Filled 2018-12-30: qty 6

## 2018-12-30 MED ORDER — ALBUMIN HUMAN 25 % IV SOLN
25.0000 g | Freq: Once | INTRAVENOUS | Status: AC
  Administered 2018-12-30: 17:00:00 25 g via INTRAVENOUS
  Filled 2018-12-30: qty 100

## 2018-12-30 NOTE — Progress Notes (Addendum)
PROGRESS NOTE    Kristin Estrada  [id number] DOB: 05/22/1940 DOA: 01/07/2019 PCP: Frazier Richards, MD   Brief Narrative:  78 y.o. WF PMHx Fibromyalgia, chronic pain syndrome (back), COPD, diabetes type 2 uncontrolled with complication, HLD,, chronic diastolic CHF, left breast cancer ER positive (negative XRT/chemotherapy)  Sent to ED for sob and hypoxia.  She has been worsening for several days with a lot of coughing.  No cp or abd pain.  No n/v/d.  dound to have covid pna and referred for admission for such. Feeling better on nrb.  Was prev on bipap now off.  mentating normally at this time.   Subjective: 11/13 negative abdominal pain, negative CP, positive S OB.  Patient overall looks improved and feels improved from yesterday.  Request to sit in the chair.   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)   Acute respiratory failure with hypoxia and hypercarbia (HCC)   Chronic diastolic CHF (congestive heart failure) (HCC)   HLD (hyperlipidemia)   Diabetes mellitus type 2, uncontrolled, with complications (HCC)   Glaucoma   Covid pneumonia/acute respiratory failure with hypoxia COVID-19 Labs  Recent Labs    12/28/18 0120 12/29/18 0330 12/30/18 0000  DDIMER 0.62* 0.92* 1.05*  FERRITIN 397* 328* 268  LDH 443* 467* 409*  CRP 6.2* 2.6* 7.4*  -Combivent QID -Discontinue Solu-Medrol changed to Decadron 6 mg daily -Remdesivir per pharmacy protocol -Flutter valve -Prone patient 16 hours/day, if patient unable to tolerate prone patient 2 to 3 hours per shift. -11/12 Actemra x1 dose -11/12 transfuse 1 unit Covid convalescent plasma  Lab Results  Component Value Date   SARSCOV2NAA POSITIVE (A) 12/28/2018    COPD  -Secondary to long history of smoking -See Covid pneumonia  Chronic diastolic CHF -We will slowly restart home CHF medication -Amlodipine 5 mg daily -11/11 Toprol 25 mg daily -Echocardiogram grade 1 diastolic dysfunction see results below -Strict in and out +964.54ml -Daily weight Filed Weights   12/29/2018 2200 12/30/18 0500  Weight: 80.6 kg 82.6 kg  -11/13 Albumin 25 g  + Lasix IV 60 mg x 1 (keep patient net even)  Essential HTN -See CHF  Diabetes type 2 uncontrolled with complication -[id number] hemoglobin A1c= 7.1 -Lantus 8 units daily -Resistant SSI  HLD -11/12 LDL= 145 -Unable to use statin secondary to patient having leg pain with its use. -11/13 Zetia 10 mg daily  Glaucoma -Liquifilm Tears  Hypokalemia -Potassium goal> 4 -K-Dur 50 mEq    DVT prophylaxis: Lovenox per pharmacy Covid protocol Code Status: Full Family Communication: 11/13 attempted to call [name] (son) received a message that call could not be completed. Disposition Plan: TBD    Consultants:  None   Procedures/Significant Events:  11/11 echocardiogram;Left Ventricle: EF = 65 to 70%.  -Grade I diastolic dysfunction (impaired relaxation).  11/12 PCXR;I-Persistent prominent bilateral pulmonary interstitial prominence consistent with active interstitial process including pneumonitis and/or interstitial edema, slightly (I believe appear slightly worse than previous exam) 11/12 transfuse 1 unit Covid convalescent plasma     I have personally reviewed and interpreted all radiology studies and my findings are as above.  VENTILATOR SETTINGS: Nonrebreather Flow; 15 L/min (patient sitting in bed comfortably) SPO2; 92%    Cultures 11/11 SARS coronavirus positive  11/11 respiratory virus panel negative      Antimicrobials: Anti-infectives (From admission, onward)   Start     Stop   12/28/18 1000  remdesivir 100 mg in sodium chloride 0.9 % 250 mL IVPB  Status:  Discontinued     12/27/18 1320    12/27/18 1600  remdesivir 100 mg in sodium chloride 0.9 % 250 mL IVPB     12/31/18 1559       Devices    LINES / TUBES:      Continuous Infusions: . sodium chloride    . albumin human    . remdesivir 100 mg in NS 250 mL 100 mg (12/29/18 1711)     Objective: Vitals:   12/30/18 0723 12/30/18 1226 12/30/18 1230 12/30/18 1245  BP: 136/60 (!) 142/59 (!) 137/59   Pulse: 71 74 73   Resp: (!) 25 18 19    Temp: 98.8 F (37.1 C) 98.5 F (36.9 C)  98.5 F (36.9 C)  TempSrc: Oral Axillary  Axillary  SpO2: 90% 99% 96%   Weight:      Height:        Intake/Output Summary (Last 24 hours) at 12/30/2018 1425 Last data filed at 12/30/2018 0300 Gross per 24 hour  Intake 1224 ml  Output 700 ml  Net 524 ml   Filed Weights   01/11/2019 2200 12/30/18 0500  Weight: 80.6 kg 82.6 kg   Physical Exam:  General: A/O x4, positive acute respiratory distress Eyes: negative scleral hemorrhage, negative anisocoria, negative icterus ENT: Negative Runny nose, negative gingival bleeding, Neck:  Negative scars, masses, torticollis, lymphadenopathy, JVD Lungs: Clear to auscultation bilaterally without wheezes or crackles Cardiovascular: Regular rate and rhythm without murmur gallop or rub normal S1 and S2 Abdomen: negative abdominal pain, nondistended, positive soft, bowel sounds, no rebound, no ascites, no appreciable mass Extremities: No significant cyanosis, clubbing, or edema bilateral lower extremities Skin: Negative rashes, lesions, ulcers Psychiatric:  Negative depression, negative anxiety, negative fatigue, negative mania  Central nervous system:  Cranial nerves II through XII intact, tongue/uvula midline, all extremities muscle strength 5/5, sensation intact throughout, negative dysarthria, negative expressive aphasia, negative receptive aphasia.  .     Data Reviewed: Care during the described time interval was provided by me .  I have reviewed this patient's available data,  including medical history, events of note, physical examination, and all test results as part of my evaluation.   CBC: Recent Labs  Lab 12/23/2018 0238 12/27/18 0353 12/28/18 0120 12/29/18 0330 12/30/18 0000  WBC 14.0* 10.2 11.9* 19.1* 15.0*  NEUTROABS 11.4* 8.0* 9.2* 14.3* 11.5*  HGB 13.5 13.8 14.8 14.6 13.8  HCT 40.9 42.9 46.2* 46.0 42.6  MCV 88.1 89.7 90.6 91.1 90.8  PLT 293 358 403* 446* [id number]   Basic Metabolic Panel: Recent Labs  Lab 12/28/2018 0300 12/27/18 0353 12/28/18 0120 12/29/18 0330 12/29/18 1435 12/30/18 0000  NA 140 142 138 142  --  141  K 4.4 3.5 4.2 3.1* 4.4 3.9  CL 103 97* 96* 99  --  100  CO2 25 31 29 31   --  29  GLUCOSE 119* 214* 291* 71  --  116*  BUN 33* 38* 48* 47*  --  42*  CREATININE 0.82 0.76 0.82 0.71  --  0.59  CALCIUM 8.8* 8.5* 8.8* 8.8*  --  8.3*  MG  --   --  2.7* 2.6* 2.3 2.4  PHOS  --   --  2.8 2.3*  --  2.8  GFR: Estimated Creatinine Clearance: 55.9 mL/min (by C-G formula based on SCr of 0.59 mg/dL). Liver Function Tests: Recent Labs  Lab 01/14/2019 0300 12/27/18 0353 12/28/18 0120 12/29/18 0330 12/30/18 0000  AST 29 21 17 18  14*  ALT 21 23 22 20 17   ALKPHOS 66 74 79 75 71  BILITOT 0.6 1.1 0.5 0.4 0.7  PROT 7.0 7.1 7.1 6.7 6.2*  ALBUMIN 3.2* 3.2* 3.1* 2.9* 2.8*   No results for input(s): LIPASE, AMYLASE in the last 168 hours. No results for input(s): AMMONIA in the last 168 hours. Coagulation Profile: No results for input(s): INR, PROTIME in the last 168 hours. Cardiac Enzymes: No results for input(s): CKTOTAL, CKMB, CKMBINDEX, TROPONINI in the last 168 hours. BNP (last 3 results) No results for input(s): PROBNP in the last 8760 hours. HbA1C: Recent Labs    12/28/18 0120  HGBA1C 7.1*   CBG: Recent Labs  Lab 12/29/18 1629 12/29/18 2138 12/30/18 0023 12/30/18 0506 12/30/18 0724  GLUCAP 279* 152* 139* 139* 122*   Lipid Profile: Recent Labs    12/28/18 0400 12/29/18 0330  CHOL 238* 218*  HDL 33* 25*  LDLCALC  166* 145*  TRIG 194* 238*  CHOLHDL 7.2 8.7   Thyroid Function Tests: No results for input(s): TSH, T4TOTAL, FREET4, T3FREE, THYROIDAB in the last 72 hours. Anemia Panel: Recent Labs    12/29/18 0330 12/30/18 0000  FERRITIN 328* 268   Urine analysis: No results found for: COLORURINE, APPEARANCEUR, LABSPEC, PHURINE, GLUCOSEU, HGBUR, BILIRUBINUR, KETONESUR, PROTEINUR, UROBILINOGEN, NITRITE, LEUKOCYTESUR Sepsis Labs: @LABRCNTIP (procalcitonin:4,lacticidven:4)  ) Recent Results (from the past 240 hour(s))  Blood Culture (routine x 2)     Status: None (Preliminary result)   Collection Time: 01/15/2019  2:38 AM   Specimen: BLOOD  Result Value Ref Range Status   Specimen Description BLOOD LEFT FOREARM  Final   Special Requests   Final    BOTTLES DRAWN AEROBIC AND ANAEROBIC Blood Culture adequate volume   Culture   Final    NO GROWTH 4 DAYS Performed at [facility], [address] [place], [place] [address]    Report Status PENDING  Incomplete  Blood Culture (routine x 2)     Status: None (Preliminary result)   Collection Time: 01/16/2019  2:38 AM   Specimen: BLOOD  Result Value Ref Range Status   Specimen Description BLOOD RIGHT HAND  Final   Special Requests   Final    BOTTLES DRAWN AEROBIC AND ANAEROBIC Blood Culture adequate volume   Culture   Final    NO GROWTH 4 DAYS Performed at [facility], [address], [place], [place] [address]    Report Status PENDING  Incomplete  SARS CORONAVIRUS 2 (TAT 6-24 HRS) Nasopharyngeal Nasopharyngeal Swab     Status: Abnormal   Collection Time: 12/28/18  8:04 AM   Specimen: Nasopharyngeal Swab  Result Value Ref Range Status   SARS Coronavirus 2 POSITIVE (A) NEGATIVE Final    Comment: RESULT CALLED TO, READ BACK BY AND VERIFIED WITH: RN ERICA DODO AT 0300 BY MESSAN HOUEGNIFIO ON 12/29/2018 (NOTE) SARS-CoV-2 target nucleic acids are DETECTED. The SARS-CoV-2 RNA is generally detectable in upper and lower respiratory  specimens during the acute phase of infection. Positive results are indicative of active infection with SARS-CoV-2. Clinical  correlation with patient history and other diagnostic information is necessary to determine patient infection status. Positive results do  not rule out bacterial infection or co-infection with other viruses. The expected result is Negative. Fact Sheet  for Patients: SugarRoll.be Fact Sheet for Healthcare Providers: https://www.Buddy Loeffelholz-mathews.com/ This test is not yet approved or cleared by the [place] FDA and  has been authorized for detection and/or diagnosis of SARS-CoV-2 by FDA under an Emergency Use Authorization (EUA). This EUA will remain  in effect (meaning this te st can be used) for the duration of the COVID-19 declaration under Section 564(b)(1) of the Act, 21 U.S.C. section 360bbb-3(b)(1), unless the authorization is terminated or revoked sooner. Performed at [facility], [place] [address], [place], [place] [address]   Respiratory Panel by PCR     Status: None   Collection Time: 12/28/18  8:04 AM   Specimen: Nasopharyngeal Swab; Respiratory  Result Value Ref Range Status   Adenovirus NOT DETECTED NOT DETECTED Final   Coronavirus 229E NOT DETECTED NOT DETECTED Final    Comment: (NOTE) The Coronavirus on the Respiratory Panel, DOES NOT test for the novel  Coronavirus (2019 nCoV)    Coronavirus HKU1 NOT DETECTED NOT DETECTED Final   Coronavirus NL63 NOT DETECTED NOT DETECTED Final   Coronavirus OC43 NOT DETECTED NOT DETECTED Final   Metapneumovirus NOT DETECTED NOT DETECTED Final   Rhinovirus / Enterovirus NOT DETECTED NOT DETECTED Final   Influenza A NOT DETECTED NOT DETECTED Final   Influenza B NOT DETECTED NOT DETECTED Final   Parainfluenza Virus 1 NOT DETECTED NOT DETECTED Final   Parainfluenza Virus 2 NOT DETECTED NOT DETECTED Final   Parainfluenza Virus 3 NOT DETECTED NOT DETECTED Final    Parainfluenza Virus 4 NOT DETECTED NOT DETECTED Final   Respiratory Syncytial Virus NOT DETECTED NOT DETECTED Final   Bordetella pertussis NOT DETECTED NOT DETECTED Final   Chlamydophila pneumoniae NOT DETECTED NOT DETECTED Final   Mycoplasma pneumoniae NOT DETECTED NOT DETECTED Final    Comment: Performed at [facility], [place]. [address] [place], [place] [address]         Radiology Studies: Dg Chest Port 1 View  Result Date: 12/29/2018 CLINICAL DATA:  Shortness of breath. EXAM: PORTABLE CHEST 1 VIEW COMPARISON:  CT 01/16/2019.  Chest x-ray 12/19/2018, 03/01/2015. FINDINGS: Mediastinum hilar structures normal. Persistent bilateral interstitial prominence, slightly improved from prior exam. No pleural effusion or pneumothorax. Stable cardiomegaly. Total left shoulder replacement. IMPRESSION: Persistent prominent bilateral pulmonary interstitial prominence consistent with active interstitial process including pneumonitis and/or interstitial edema, slightly improved from prior exam. Electronically Signed   By: Morrison   On: 12/29/2018 09:51        Scheduled Meds: . sodium chloride   Intravenous Once  . amLODipine  5 mg Oral Daily  . dexamethasone (DECADRON) injection  6 mg Intravenous Q24H  . enoxaparin (LOVENOX) injection  40 mg Subcutaneous Daily  . ezetimibe  10 mg Oral Daily  . famotidine  20 mg Oral BID  . furosemide  60 mg Intravenous Once  . influenza vaccine adjuvanted  0.5 mL Intramuscular Tomorrow-1000  . insulin aspart  0-20 Units Subcutaneous Q4H  . insulin glargine  8 Units Subcutaneous Daily  . Ipratropium-Albuterol  1 puff Inhalation Q6H  . levothyroxine  50 mcg Oral QAC breakfast  . metoprolol succinate  25 mg Oral Daily  . pneumococcal 23 valent vaccine  0.5 mL Intramuscular Tomorrow-1000  . sodium chloride flush  3 mL Intravenous Q12H  . vitamin C  500 mg Oral Daily  . zinc sulfate  220 mg Oral Daily   Continuous Infusions: . sodium chloride     . albumin human    .  remdesivir 100 mg in NS 250 mL 100 mg (12/29/18 1711)     LOS: 4 days   The patient is critically ill with multiple organ systems failure and requires high complexity decision making for assessment and support, frequent evaluation and titration of therapies, application of advanced monitoring technologies and extensive interpretation of multiple databases. Critical Care Time devoted to patient care services described in this note  Time spent: 40 minutes     Amrit Erck, Geraldo Docker, MD [facility] Pager [phone]  If 7PM-7AM, please contact night-coverage [email or web address] Password [email or web address] 12/30/2018, 2:25 PM

## 2018-12-31 LAB — COMPREHENSIVE METABOLIC PANEL
ALT: 59 U/L — ABNORMAL HIGH (ref 0–44)
AST: 13 U/L — ABNORMAL LOW (ref 15–41)
Albumin: 3.6 g/dL (ref 3.5–5.0)
Alkaline Phosphatase: 67 U/L (ref 38–126)
Anion gap: 11 (ref 5–15)
BUN: 37 mg/dL — ABNORMAL HIGH (ref 8–23)
CO2: 32 mmol/L (ref 22–32)
Calcium: 8.7 mg/dL — ABNORMAL LOW (ref 8.9–10.3)
Chloride: 98 mmol/L (ref 98–111)
Creatinine, Ser: 0.63 mg/dL (ref 0.44–1.00)
GFR calc Af Amer: >60 mL/min (ref >60)
GFR calc non Af Amer: >60 mL/min (ref >60)
Glucose, Bld: 135 mg/dL — ABNORMAL HIGH (ref 70–99)
Potassium: 3.6 mmol/L (ref 3.5–5.1)
Sodium: 141 mmol/L (ref 135–145)
Total Bilirubin: 0.7 mg/dL (ref 0.3–1.2)
Total Protein: 6.5 g/dL (ref 6.5–8.1)

## 2018-12-31 LAB — CBC WITH DIFFERENTIAL/PLATELET
Abs Immature Granulocytes: 0.57 10*3/uL — ABNORMAL HIGH (ref 0.00–0.07)
Basophils Absolute: 0.1 10*3/uL (ref 0.0–0.1)
Basophils Relative: 1 %
Eosinophils Absolute: 0 10*3/uL (ref 0.0–0.5)
Eosinophils Relative: 0 %
HCT: 39.2 % (ref 36.0–46.0)
Hemoglobin: 12.7 g/dL (ref 12.0–15.0)
Immature Granulocytes: 4 %
Lymphocytes Relative: 10 %
Lymphs Abs: 1.3 10*3/uL (ref 0.7–4.0)
MCH: 29.3 pg (ref 26.0–34.0)
MCHC: 32.4 g/dL (ref 30.0–36.0)
MCV: 90.3 fL (ref 80.0–100.0)
Monocytes Absolute: 0.9 10*3/uL (ref 0.1–1.0)
Monocytes Relative: 7 %
Neutro Abs: 9.9 10*3/uL — ABNORMAL HIGH (ref 1.7–7.7)
Neutrophils Relative %: 78 %
Platelets: 381 10*3/uL (ref 150–400)
RBC: 4.34 MIL/uL (ref 3.87–5.11)
RDW: 14.1 % (ref 11.5–15.5)
WBC: 12.8 10*3/uL — ABNORMAL HIGH (ref 4.0–10.5)
nRBC: 0 % (ref 0.0–0.2)

## 2018-12-31 LAB — PHOSPHORUS: Phosphorus: 3.4 mg/dL (ref 2.5–4.6)

## 2018-12-31 LAB — D-DIMER, QUANTITATIVE: D-Dimer, Quant: 1.22 ug/mL-FEU — ABNORMAL HIGH (ref 0.00–0.50)

## 2018-12-31 LAB — LACTATE DEHYDROGENASE: LDH: 327 U/L — ABNORMAL HIGH (ref 98–192)

## 2018-12-31 LAB — CULTURE, BLOOD (ROUTINE X 2)
Culture: NO GROWTH
Culture: NO GROWTH
Special Requests: ADEQUATE
Special Requests: ADEQUATE

## 2018-12-31 LAB — C-REACTIVE PROTEIN: CRP: 3.5 mg/dL — ABNORMAL HIGH (ref <1.0)

## 2018-12-31 LAB — MAGNESIUM: Magnesium: 2.2 mg/dL (ref 1.7–2.4)

## 2018-12-31 LAB — FERRITIN: Ferritin: 215 ng/mL (ref 11–307)

## 2018-12-31 MED ORDER — POTASSIUM CHLORIDE CRYS ER 20 MEQ PO TBCR
50.0000 meq | EXTENDED_RELEASE_TABLET | Freq: Once | ORAL | Status: AC
  Administered 2018-12-31: 12:00:00 50 meq via ORAL
  Filled 2018-12-31: qty 3

## 2018-12-31 MED ORDER — FUROSEMIDE 10 MG/ML IJ SOLN
60.0000 mg | Freq: Once | INTRAMUSCULAR | Status: AC
  Administered 2018-12-31: 60 mg via INTRAVENOUS
  Filled 2018-12-31: qty 6

## 2018-12-31 NOTE — Progress Notes (Signed)
Patient questioning her chances of recovery. RN explained the severity of her condition with her requiring such high amounts of oxygen and how quickly and how low her oxygen drops without the oxygen. Educated patient on need to pulmonary toilet, deep breathe, prone as able, adhere to medication schedule, use incentive spirometer, and eat as nutritious as possible to help body fight off COVID 19 infection. Patient able to verbalize understanding. Patient endorses having COPD, diabetes and fibromyalgia and states that her health problems will make harder for her recover than other people like her son. Patient states that she has been doing a lot of praying and that "if God is ready to take me, then he will take me. I'm okay with that." Spoke to patient about her wishes as far as CPR, and ventilator. Patient says she has been thinking about it but is not currently ready to make a decision. She says she knows that her son would want her to do everything but she doesn't know if that is what she wants. Encouraged her to speak with her doctor about it tomorrow, patient is agreeable.

## 2018-12-31 NOTE — Progress Notes (Signed)
Upon taking report from Manuela Schwartz, RN after lunch break, was notified that patient had taken O2 off during sleep and desaturated down into the 30's. Manuela Schwartz stated that after O2 was resumed, O2 saturation slowly rose back up into the high 80's to low 90's. Upon entering room for assessment, patient was sleeping comfortably with O2 @ 10L via nonrebreather mask.

## 2018-12-31 NOTE — Progress Notes (Signed)
Patient son in to visit mother. Updated.

## 2018-12-31 NOTE — Progress Notes (Addendum)
PROGRESS NOTE    Kristin Estrada  [id number] DOB: 1940/10/25 DOA: 12/24/2018 PCP: Frazier Richards, MD   Brief Narrative:  78 y.o. WF PMHx Fibromyalgia, chronic pain syndrome (back), COPD, diabetes type 2 uncontrolled with complication, HLD,, chronic diastolic CHF, left breast cancer ER positive (negative XRT/chemotherapy)  Sent to ED for sob and hypoxia.  She has been worsening for several days with a lot of coughing.  No cp or abd pain.  No n/v/d.  dound to have covid pna and referred for admission for such. Feeling better on nrb.  Was prev on bipap now off.  mentating normally at this time.   Subjective: 11/14 last 24 hours afebrile, positive anxiety, negative CP, positive S OB.  A/O x4.    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   Primary cancer of lower outer quadrant of left female breast ([place])   Acute respiratory failure due to COVID-19 [facility])   Acute respiratory failure with hypoxia and hypercarbia (HCC)   Chronic diastolic CHF (congestive heart failure) (HCC)   HLD (hyperlipidemia)   Diabetes mellitus type 2, uncontrolled, with complications (HCC)   Glaucoma   Covid pneumonia/acute respiratory failure with hypoxia COVID-19 Labs  Recent Labs    12/29/18 0330 12/30/18 0000 12/31/18 0039  DDIMER 0.92* 1.05* 1.22*  FERRITIN 328* 268 215  LDH 467* 409* 327*  CRP 2.6* 7.4* 3.5*  -Combivent QID -Discontinue Solu-Medrol changed to Decadron 6 mg daily -Remdesivir per pharmacy protocol -Flutter valve -Prone patient 16 hours/day, if patient unable to tolerate prone patient 2 to 3 hours per shift. -11/12 Actemra x1 dose -11/12 transfuse 1 unit Covid convalescent plasma  Lab Results  Component Value Date   SARSCOV2NAA POSITIVE (A) 12/28/2018    COPD -Secondary to long history of smoking -See Covid pneumonia  Chronic  diastolic CHF -We will slowly restart home CHF medication -Amlodipine 5 mg daily -11/11 Toprol 25 mg daily -Echocardiogram grade 1 diastolic dysfunction see results below -Strict in and out +971.23ml -Daily weight Filed Weights   12/25/2018 2200 12/30/18 0500  Weight: 80.6 kg 82.6 kg  -11/13 Albumin 25 g  + Lasix IV 60 mg x 1 (keep patient net even) -11/14 Lasix IV 60 mg x 1 -11/15 PCXR pending  Essential HTN -See CHF  Diabetes type 2 uncontrolled with complication -[id number] hemoglobin A1c= 7.1 -Lantus 8 units daily -Resistant SSI  HLD -11/12 LDL= 145 -Unable to use statin secondary to patient having leg pain with its use. -11/13 Zetia 10 mg daily  Glaucoma -Liquifilm Tears  Hypokalemia -Potassium goal> 4 - K-Dur 50 mEq  Goals of care -11/14 palliative care consult; discuss with patient DNR given the seriousness of her disease process     DVT prophylaxis: Lovenox per pharmacy Covid protocol Code Status: Full Family Communication: 11/14 was informed by colleague that [name] (son) also here hospitalized at [facility]  Disposition Plan: TBD    Consultants:  None   Procedures/Significant Events:  11/11 echocardiogram;Left Ventricle: EF = 65 to 70%.  -Grade I diastolic dysfunction (impaired relaxation).  11/12 PCXR;I-Persistent prominent bilateral pulmonary interstitial prominence consistent with active interstitial process including pneumonitis and/or interstitial edema, slightly (I believe appear slightly worse than previous exam) 11/12 transfuse 1 unit Covid convalescent plasma     I have personally reviewed and interpreted all radiology studies and my findings are as above.  VENTILATOR  SETTINGS: HFNC/nonrebreather Flow; 15 L/min  SPO2; 94%    Cultures 11/11 SARS coronavirus positive 11/11 respiratory virus panel negative      Antimicrobials: Anti-infectives (From admission, onward)   Start     Stop   12/28/18 1000  remdesivir 100 mg in sodium chloride  0.9 % 250 mL IVPB  Status:  Discontinued     12/27/18 1320   12/27/18 1600  remdesivir 100 mg in sodium chloride 0.9 % 250 mL IVPB     12/31/18 1559       Devices    LINES / TUBES:      Continuous Infusions: . sodium chloride       Objective: Vitals:   12/30/18 2200 12/31/18 0000 12/31/18 0400 12/31/18 0800  BP: (!) 136/56 (!) 119/52  (!) 146/59  Pulse: 72 69  71  Resp: (!) 21 18  19   Temp: 98.6 F (37 C)  98.5 F (36.9 C) 98.2 F (36.8 C)  TempSrc: Oral  Oral Oral  SpO2: 95% 91%  95%  Weight:      Height:        Intake/Output Summary (Last 24 hours) at 12/31/2018 1000 Last data filed at 12/31/2018 0800 Gross per 24 hour  Intake 1257.51 ml  Output 1250 ml  Net 7.51 ml   Filed Weights   12/20/2018 2200 12/30/18 0500  Weight: 80.6 kg 82.6 kg  Physical Exam:  General: A/O x4, positive acute respiratory distress Eyes: negative scleral hemorrhage, negative anisocoria, negative icterus ENT: Negative Runny nose, negative gingival bleeding, Neck:  Negative scars, masses, torticollis, lymphadenopathy, JVD Lungs: Clear to auscultation bilaterally without wheezes or crackles Cardiovascular: Regular rate and rhythm without murmur gallop or rub normal S1 and S2 Abdomen: negative abdominal pain, nondistended, positive soft, bowel sounds, no rebound, no ascites, no appreciable mass Extremities: No significant cyanosis, clubbing, or edema bilateral lower extremities Skin: Negative rashes, lesions, ulcers Psychiatric:  Negative depression, positive  anxiety, negative fatigue, negative mania  Central nervous system:  Cranial nerves II through XII intact, tongue/uvula midline, all extremities muscle strength 5/5, sensation intact throughout, negative dysarthria, negative expressive aphasia, negative receptive aphasia.     Data Reviewed: Care during the described time interval was provided by me .  I have reviewed this patient's available data, including medical history,  events of note, physical examination, and all test results as part of my evaluation.   CBC: Recent Labs  Lab 12/27/18 0353 12/28/18 0120 12/29/18 0330 12/30/18 0000 12/31/18 0039  WBC 10.2 11.9* 19.1* 15.0* 12.8*  NEUTROABS 8.0* 9.2* 14.3* 11.5* 9.9*  HGB 13.8 14.8 14.6 13.8 12.7  HCT 42.9 46.2* 46.0 42.6 39.2  MCV 89.7 90.6 91.1 90.8 90.3  PLT 358 403* 446* 394 [id number]   Basic Metabolic Panel: Recent Labs  Lab 12/27/18 0353 12/28/18 0120 12/29/18 0330 12/29/18 1435 12/30/18 0000 12/31/18 0039  NA 142 138 142  --  141 141  K 3.5 4.2 3.1* 4.4 3.9 3.6  CL 97* 96* 99  --  100 98  CO2 31 29 31   --  29 32  GLUCOSE 214* 291* 71  --  116* 135*  BUN 38* 48* 47*  --  42* 37*  CREATININE 0.76 0.82 0.71  --  0.59 0.63  CALCIUM 8.5* 8.8* 8.8*  --  8.3* 8.7*  MG  --  2.7* 2.6* 2.3 2.4 2.2  PHOS  --  2.8 2.3*  --  2.8 3.4   GFR: Estimated Creatinine Clearance: 55.9 mL/min (by C-G  formula based on SCr of 0.63 mg/dL). Liver Function Tests: Recent Labs  Lab 12/27/18 0353 12/28/18 0120 12/29/18 0330 12/30/18 0000 12/31/18 0039  AST 21 17 18  14* 13*  ALT 23 22 20 17  59*  ALKPHOS 74 79 75 71 67  BILITOT 1.1 0.5 0.4 0.7 0.7  PROT 7.1 7.1 6.7 6.2* 6.5  ALBUMIN 3.2* 3.1* 2.9* 2.8* 3.6   No results for input(s): LIPASE, AMYLASE in the last 168 hours. No results for input(s): AMMONIA in the last 168 hours. Coagulation Profile: No results for input(s): INR, PROTIME in the last 168 hours. Cardiac Enzymes: No results for input(s): CKTOTAL, CKMB, CKMBINDEX, TROPONINI in the last 168 hours. BNP (last 3 results) No results for input(s): PROBNP in the last 8760 hours. HbA1C: No results for input(s): HGBA1C in the last 72 hours. CBG: Recent Labs  Lab 12/29/18 2138 12/30/18 0023 12/30/18 0506 12/30/18 0724 12/30/18 2209  GLUCAP 152* 139* 139* 122* 181*   Lipid Profile: Recent Labs    12/29/18 0330  CHOL 218*  HDL 25*  LDLCALC 145*  TRIG 238*  CHOLHDL 8.7   Thyroid  Function Tests: No results for input(s): TSH, T4TOTAL, FREET4, T3FREE, THYROIDAB in the last 72 hours. Anemia Panel: Recent Labs    12/30/18 0000 12/31/18 0039  FERRITIN 268 215   Urine analysis: No results found for: COLORURINE, APPEARANCEUR, LABSPEC, PHURINE, GLUCOSEU, HGBUR, BILIRUBINUR, KETONESUR, PROTEINUR, UROBILINOGEN, NITRITE, LEUKOCYTESUR Sepsis Labs: @LABRCNTIP (procalcitonin:4,lacticidven:4)  ) Recent Results (from the past 240 hour(s))  Blood Culture (routine x 2)     Status: None   Collection Time: 12/28/2018  2:38 AM   Specimen: BLOOD  Result Value Ref Range Status   Specimen Description BLOOD LEFT FOREARM  Final   Special Requests   Final    BOTTLES DRAWN AEROBIC AND ANAEROBIC Blood Culture adequate volume   Culture   Final    NO GROWTH 5 DAYS Performed at [facility], [address], [place], [place] [address]    Report Status 12/31/2018 FINAL  Final  Blood Culture (routine x 2)     Status: None   Collection Time: 01/04/2019  2:38 AM   Specimen: BLOOD  Result Value Ref Range Status   Specimen Description BLOOD RIGHT HAND  Final   Special Requests   Final    BOTTLES DRAWN AEROBIC AND ANAEROBIC Blood Culture adequate volume   Culture   Final    NO GROWTH 5 DAYS Performed at [facility], [address] [place],  [address]    Report Status 12/31/2018 FINAL  Final  SARS CORONAVIRUS 2 (TAT 6-24 HRS) Nasopharyngeal Nasopharyngeal Swab     Status: Abnormal   Collection Time: 12/28/18  8:04 AM   Specimen: Nasopharyngeal Swab  Result Value Ref Range Status   SARS Coronavirus 2 POSITIVE (A) NEGATIVE Final    Comment: RESULT CALLED TO, READ BACK BY AND VERIFIED WITH: RN ERICA DODO AT 0300 BY MESSAN HOUEGNIFIO ON 12/29/2018 (NOTE) SARS-CoV-2 target nucleic acids are DETECTED. The SARS-CoV-2 RNA is generally detectable in upper and lower respiratory specimens during the acute phase of infection. Positive results are indicative of active  infection with SARS-CoV-2. Clinical  correlation with patient history and other diagnostic information is necessary to determine patient infection status. Positive results do  not rule out bacterial infection or co-infection with other viruses. The expected result is Negative. Fact Sheet for Patients: SugarRoll.be Fact Sheet for Healthcare Providers: https://www.woods-mathews.com/ This test is not yet approved or cleared by the  [place] Architectural technologist and  has been authorized for detection and/or diagnosis of SARS-CoV-2 by FDA under an Print production planner (EUA). This EUA will remain  in effect (meaning this te st can be used) for the duration of the COVID-19 declaration under Section 564(b)(1) of the Act, 21 U.S.C. section 360bbb-3(b)(1), unless the authorization is terminated or revoked sooner. Performed at [facility], [place] [address], [place], [place] [address]   Respiratory Panel by PCR     Status: None   Collection Time: 12/28/18  8:04 AM   Specimen: Nasopharyngeal Swab; Respiratory  Result Value Ref Range Status   Adenovirus NOT DETECTED NOT DETECTED Final   Coronavirus 229E NOT DETECTED NOT DETECTED Final    Comment: (NOTE) The Coronavirus on the Respiratory Panel, DOES NOT test for the novel  Coronavirus (2019 nCoV)    Coronavirus HKU1 NOT DETECTED NOT DETECTED Final   Coronavirus NL63 NOT DETECTED NOT DETECTED Final   Coronavirus OC43 NOT DETECTED NOT DETECTED Final   Metapneumovirus NOT DETECTED NOT DETECTED Final   Rhinovirus / Enterovirus NOT DETECTED NOT DETECTED Final   Influenza A NOT DETECTED NOT DETECTED Final   Influenza B NOT DETECTED NOT DETECTED Final   Parainfluenza Virus 1 NOT DETECTED NOT DETECTED Final   Parainfluenza Virus 2 NOT DETECTED NOT DETECTED Final   Parainfluenza Virus 3 NOT DETECTED NOT DETECTED Final   Parainfluenza Virus 4 NOT DETECTED NOT DETECTED Final   Respiratory Syncytial Virus NOT  DETECTED NOT DETECTED Final   Bordetella pertussis NOT DETECTED NOT DETECTED Final   Chlamydophila pneumoniae NOT DETECTED NOT DETECTED Final   Mycoplasma pneumoniae NOT DETECTED NOT DETECTED Final    Comment: Performed at [facility], [place]. [address] [place], [place] [address]         Radiology Studies: No results found.      Scheduled Meds: . sodium chloride   Intravenous Once  . amLODipine  5 mg Oral Daily  . dexamethasone (DECADRON) injection  6 mg Intravenous Q24H  . enoxaparin (LOVENOX) injection  40 mg Subcutaneous Daily  . ezetimibe  10 mg Oral Daily  . famotidine  20 mg Oral BID  . influenza vaccine adjuvanted  0.5 mL Intramuscular Tomorrow-1000  . insulin aspart  0-20 Units Subcutaneous Q4H  . insulin glargine  8 Units Subcutaneous Daily  . Ipratropium-Albuterol  1 puff Inhalation Q6H  . levothyroxine  50 mcg Oral QAC breakfast  . metoprolol succinate  25 mg Oral Daily  . pneumococcal 23 valent vaccine  0.5 mL Intramuscular Tomorrow-1000  . sodium chloride flush  3 mL Intravenous Q12H  . vitamin C  500 mg Oral Daily  . zinc sulfate  220 mg Oral Daily   Continuous Infusions: . sodium chloride       LOS: 5 days   The patient is critically ill with multiple organ systems failure and requires high complexity decision making for assessment and support, frequent evaluation and titration of therapies, application of advanced monitoring technologies and extensive interpretation of multiple databases. Critical Care Time devoted to patient care services described in this note  Time spent: 40 minutes     WOODS, Geraldo Docker, MD [facility] Pager [phone]  If 7PM-7AM, please contact night-coverage [email or web address] Password [email or web address] 12/31/2018, 10:00 AM

## 2019-01-01 ENCOUNTER — Inpatient Hospital Stay (HOSPITAL_COMMUNITY): Payer: PPO

## 2019-01-01 LAB — COMPREHENSIVE METABOLIC PANEL
ALT: 17 U/L (ref 0–44)
AST: 17 U/L (ref 15–41)
Albumin: 3.7 g/dL (ref 3.5–5.0)
Alkaline Phosphatase: 75 U/L (ref 38–126)
Anion gap: 12 (ref 5–15)
BUN: 50 mg/dL — ABNORMAL HIGH (ref 8–23)
CO2: 28 mmol/L (ref 22–32)
Calcium: 8.9 mg/dL (ref 8.9–10.3)
Chloride: 95 mmol/L — ABNORMAL LOW (ref 98–111)
Creatinine, Ser: 1.03 mg/dL — ABNORMAL HIGH (ref 0.44–1.00)
GFR calc Af Amer: 59 mL/min — ABNORMAL LOW (ref >60)
GFR calc non Af Amer: 51 mL/min — ABNORMAL LOW (ref >60)
Glucose, Bld: 328 mg/dL — ABNORMAL HIGH (ref 70–99)
Potassium: 3.9 mmol/L (ref 3.5–5.1)
Sodium: 135 mmol/L (ref 135–145)
Total Bilirubin: 1 mg/dL (ref 0.3–1.2)
Total Protein: 7.3 g/dL (ref 6.5–8.1)

## 2019-01-01 LAB — C-REACTIVE PROTEIN: CRP: 1.2 mg/dL — ABNORMAL HIGH (ref <1.0)

## 2019-01-01 LAB — PREPARE FRESH FROZEN PLASMA: Unit division: 0

## 2019-01-01 LAB — CBC WITH DIFFERENTIAL/PLATELET
Abs Immature Granulocytes: 0.84 10*3/uL — ABNORMAL HIGH (ref 0.00–0.07)
Basophils Absolute: 0.1 10*3/uL (ref 0.0–0.1)
Basophils Relative: 1 %
Eosinophils Absolute: 0.1 10*3/uL (ref 0.0–0.5)
Eosinophils Relative: 0 %
HCT: 46.6 % — ABNORMAL HIGH (ref 36.0–46.0)
Hemoglobin: 15 g/dL (ref 12.0–15.0)
Immature Granulocytes: 4 %
Lymphocytes Relative: 7 %
Lymphs Abs: 1.4 10*3/uL (ref 0.7–4.0)
MCH: 29.3 pg (ref 26.0–34.0)
MCHC: 32.2 g/dL (ref 30.0–36.0)
MCV: 91 fL (ref 80.0–100.0)
Monocytes Absolute: 1.1 10*3/uL — ABNORMAL HIGH (ref 0.1–1.0)
Monocytes Relative: 6 %
Neutro Abs: 15.9 10*3/uL — ABNORMAL HIGH (ref 1.7–7.7)
Neutrophils Relative %: 82 %
Platelets: 479 10*3/uL — ABNORMAL HIGH (ref 150–400)
RBC: 5.12 MIL/uL — ABNORMAL HIGH (ref 3.87–5.11)
RDW: 14.3 % (ref 11.5–15.5)
WBC: 19.5 10*3/uL — ABNORMAL HIGH (ref 4.0–10.5)
nRBC: 0 % (ref 0.0–0.2)

## 2019-01-01 LAB — LACTATE DEHYDROGENASE: LDH: 383 U/L — ABNORMAL HIGH (ref 98–192)

## 2019-01-01 LAB — BPAM FFP
Blood Product Expiration Date: 202011140849
ISSUE DATE / TIME: 202011130907
Unit Type and Rh: 6200

## 2019-01-01 LAB — D-DIMER, QUANTITATIVE: D-Dimer, Quant: 1.51 ug/mL-FEU — ABNORMAL HIGH (ref 0.00–0.50)

## 2019-01-01 LAB — FERRITIN: Ferritin: 238 ng/mL (ref 11–307)

## 2019-01-01 LAB — GLUCOSE, CAPILLARY: Glucose-Capillary: 154 mg/dL — ABNORMAL HIGH (ref 70–99)

## 2019-01-01 LAB — PHOSPHORUS: Phosphorus: 3.5 mg/dL (ref 2.5–4.6)

## 2019-01-01 LAB — MAGNESIUM: Magnesium: 2.4 mg/dL (ref 1.7–2.4)

## 2019-01-01 MED ORDER — FUROSEMIDE 10 MG/ML IJ SOLN
60.0000 mg | Freq: Two times a day (BID) | INTRAMUSCULAR | Status: DC
  Administered 2019-01-01 – 2019-01-04 (×6): 60 mg via INTRAVENOUS
  Filled 2019-01-01 (×6): qty 6

## 2019-01-01 MED ORDER — POLYETHYLENE GLYCOL 3350 17 G PO PACK
17.0000 g | PACK | Freq: Two times a day (BID) | ORAL | Status: DC
  Administered 2019-01-01 – 2019-01-03 (×5): 17 g via ORAL
  Filled 2019-01-01 (×6): qty 1

## 2019-01-01 NOTE — Progress Notes (Signed)
PROGRESS NOTE    Kristin Estrada  [id number] DOB: 12-11-1940 DOA: 12/25/2018 PCP: Frazier Richards, MD   Brief Narrative:  78 y.o. WF PMHx Fibromyalgia, chronic pain syndrome (back), COPD, diabetes type 2 uncontrolled with complication, HLD,, chronic diastolic CHF, left breast cancer ER positive (negative XRT/chemotherapy)  Sent to ED for sob and hypoxia.  She has been worsening for several days with a lot of coughing.  No cp or abd pain.  No n/v/d.  dound to have covid pna and referred for admission for such. Feeling better on nrb.  Was prev on bipap now off.  mentating normally at this time.   Subjective: 11/15 afebrile last 24 hours, positive anxiety, negative CP, negative abdominal pain.  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   Primary cancer of lower outer quadrant of left female breast ([place])   Acute respiratory failure due to COVID-19 [facility])   Acute respiratory failure with hypoxia and hypercarbia (HCC)   Chronic diastolic CHF (congestive heart failure) (HCC)   HLD (hyperlipidemia)   Diabetes mellitus type 2, uncontrolled, with complications (HCC)   Glaucoma   Covid pneumonia/acute respiratory failure with hypoxia COVID-19 Labs  Recent Labs    12/30/18 0000 12/31/18 0039 01/01/19 0255  DDIMER 1.05* 1.22*  --   FERRITIN 268 215 238  LDH 409* 327* 383*  CRP 7.4* 3.5*  --   -Combivent QID -Discontinue Solu-Medrol changed to Decadron 6 mg daily -Remdesivir per pharmacy protocol -Flutter valve -Prone patient 16 hours/day, if patient unable to tolerate prone patient 2 to 3 hours per shift. -11/12 Actemra x1 dose -11/12 transfuse 1 unit Covid convalescent plasma  Lab Results  Component Value Date   SARSCOV2NAA POSITIVE (A) 12/28/2018    COPD -Secondary to long history of smoking -See Covid  pneumonia  Chronic diastolic CHF -We will slowly restart home CHF medication -Amlodipine 5 mg daily -11/11 Toprol 25 mg daily -Echocardiogram grade 1 diastolic dysfunction see results below -Strict in and out +1.38 L -Daily weight Filed Weights   01/04/2019 2200 12/30/18 0500 01/01/19 0447  Weight: 80.6 kg 82.6 kg 79.3 kg  -11/13 Albumin 25 g  + Lasix IV 60 mg x 1 (keep patient net even) -11/15 Lasix IV 60 mg BID -11/15 PCXR pending  Essential HTN -See CHF  Diabetes type 2 uncontrolled with complication -[id number] hemoglobin A1c= 7.1 -Lantus 8 units daily -Resistant SSI  HLD -11/12 LDL= 145 -Unable to use statin secondary to patient having leg pain with its use. -11/13 Zetia 10 mg daily  Glaucoma -Liquifilm Tears  Hypokalemia -Potassium goal> 4   Goals of care -11/14 palliative care consult; discuss with patient DNR given the seriousness of her disease process     DVT prophylaxis: Lovenox per pharmacy Covid protocol Code Status: Full Family Communication: 11/14 was informed by colleague that [name] (son) also here hospitalized at [facility]  Disposition Plan: TBD    Consultants:  None   Procedures/Significant Events:  11/11 echocardiogram;Left Ventricle: EF = 65 to 70%.  -Grade I diastolic dysfunction (impaired relaxation).  11/12 PCXR;I-Persistent prominent bilateral pulmonary interstitial prominence consistent with active interstitial process including pneumonitis and/or interstitial edema, slightly (I believe appear slightly worse than previous exam) 11/12 transfuse 1 unit Covid convalescent plasma     I have personally reviewed and interpreted all radiology studies and my  findings are as above.  VENTILATOR SETTINGS: HFNC/nonrebreather Flow; 15 L/min  SPO2; 94%    Cultures 11/11 SARS coronavirus positive 11/11 respiratory virus panel negative      Antimicrobials: Anti-infectives (From admission, onward)   Start     Stop   12/28/18 1000  remdesivir  100 mg in sodium chloride 0.9 % 250 mL IVPB  Status:  Discontinued     12/27/18 1320   12/27/18 1600  remdesivir 100 mg in sodium chloride 0.9 % 250 mL IVPB     12/31/18 1559       Devices    LINES / TUBES:      Continuous Infusions: . sodium chloride       Objective: Vitals:   12/31/18 2025 01/01/19 0446 01/01/19 0447 01/01/19 0801  BP: 117/61 (!) 152/48    Pulse: 72 73    Resp: (!) 24 (!) 25    Temp: 98.6 F (37 C) 98.6 F (37 C)  98.3 F (36.8 C)  TempSrc: Oral Oral    SpO2: 96% 93%    Weight:   79.3 kg   Height:        Intake/Output Summary (Last 24 hours) at 01/01/2019 0936 Last data filed at 01/01/2019 0400 Gross per 24 hour  Intake 960 ml  Output 550 ml  Net 410 ml   Filed Weights   12/31/2018 2200 12/30/18 0500 01/01/19 0447  Weight: 80.6 kg 82.6 kg 79.3 kg   Physical Exam:  General: A/O x4, positive acute respiratory distress Eyes: negative scleral hemorrhage, negative anisocoria, negative icterus ENT: Negative Runny nose, negative gingival bleeding, Neck:  Negative scars, masses, torticollis, lymphadenopathy, JVD Lungs: Clear to auscultation bilaterally without wheezes or crackles Cardiovascular: Regular rate and rhythm without murmur gallop or rub normal S1 and S2 Abdomen: negative abdominal pain, nondistended, positive soft, bowel sounds, no rebound, no ascites, no appreciable mass Extremities: No significant cyanosis, clubbing, or edema bilateral lower extremities Skin: Negative rashes, lesions, ulcers Psychiatric:  Negative depression, positive anxiety, negative fatigue, negative mania  Central nervous system:  Cranial nerves II through XII intact, tongue/uvula midline, all extremities muscle strength 5/5, sensation intact throughout, negative dysarthria, negative expressive aphasia, negative receptive aphasia.   Data Reviewed: Care during the described time interval was provided by me .  I have reviewed this patient's available data,  including medical history, events of note, physical examination, and all test results as part of my evaluation.   CBC: Recent Labs  Lab 12/27/18 0353 12/28/18 0120 12/29/18 0330 12/30/18 0000 12/31/18 0039  WBC 10.2 11.9* 19.1* 15.0* 12.8*  NEUTROABS 8.0* 9.2* 14.3* 11.5* 9.9*  HGB 13.8 14.8 14.6 13.8 12.7  HCT 42.9 46.2* 46.0 42.6 39.2  MCV 89.7 90.6 91.1 90.8 90.3  PLT 358 403* 446* 394 [id number]   Basic Metabolic Panel: Recent Labs  Lab 12/27/18 0353  12/28/18 0120 12/29/18 0330 12/29/18 1435 12/30/18 0000 12/31/18 0039 01/01/19 0255  NA 142  --  138 142  --  141 141  --   K 3.5  --  4.2 3.1* 4.4 3.9 3.6  --   CL 97*  --  96* 99  --  100 98  --   CO2 31  --  29 31  --  29 32  --   GLUCOSE 214*  --  291* 71  --  116* 135*  --   BUN 38*  --  48* 47*  --  42* 37*  --   CREATININE 0.76  --  0.82 0.71  --  0.59 0.63  --   CALCIUM 8.5*  --  8.8* 8.8*  --  8.3* 8.7*  --   MG  --    < > 2.7* 2.6* 2.3 2.4 2.2 2.4  PHOS  --   --  2.8 2.3*  --  2.8 3.4 3.5   < > = values in this interval not displayed.   GFR: Estimated Creatinine Clearance: 54.7 mL/min (by C-G formula based on SCr of 0.63 mg/dL). Liver Function Tests: Recent Labs  Lab 12/27/18 0353 12/28/18 0120 12/29/18 0330 12/30/18 0000 12/31/18 0039  AST 21 17 18  14* 13*  ALT 23 22 20 17  59*  ALKPHOS 74 79 75 71 67  BILITOT 1.1 0.5 0.4 0.7 0.7  PROT 7.1 7.1 6.7 6.2* 6.5  ALBUMIN 3.2* 3.1* 2.9* 2.8* 3.6   No results for input(s): LIPASE, AMYLASE in the last 168 hours. No results for input(s): AMMONIA in the last 168 hours. Coagulation Profile: No results for input(s): INR, PROTIME in the last 168 hours. Cardiac Enzymes: No results for input(s): CKTOTAL, CKMB, CKMBINDEX, TROPONINI in the last 168 hours. BNP (last 3 results) No results for input(s): PROBNP in the last 8760 hours. HbA1C: No results for input(s): HGBA1C in the last 72 hours. CBG: Recent Labs  Lab 12/30/18 0023 12/30/18 0506 12/30/18 0724  12/30/18 2209 12/31/18 2356  GLUCAP 139* 139* 122* 181* 154*   Lipid Profile: No results for input(s): CHOL, HDL, LDLCALC, TRIG, CHOLHDL, LDLDIRECT in the last 72 hours. Thyroid Function Tests: No results for input(s): TSH, T4TOTAL, FREET4, T3FREE, THYROIDAB in the last 72 hours. Anemia Panel: Recent Labs    12/31/18 0039 01/01/19 0255  FERRITIN 215 238   Urine analysis: No results found for: COLORURINE, APPEARANCEUR, LABSPEC, PHURINE, GLUCOSEU, HGBUR, BILIRUBINUR, KETONESUR, PROTEINUR, UROBILINOGEN, NITRITE, LEUKOCYTESUR Sepsis Labs: @LABRCNTIP (procalcitonin:4,lacticidven:4)  ) Recent Results (from the past 240 hour(s))  Blood Culture (routine x 2)     Status: None   Collection Time: 01/16/2019  2:38 AM   Specimen: BLOOD  Result Value Ref Range Status   Specimen Description BLOOD LEFT FOREARM  Final   Special Requests   Final    BOTTLES DRAWN AEROBIC AND ANAEROBIC Blood Culture adequate volume   Culture   Final    NO GROWTH 5 DAYS Performed at [facility], [address], [place], [place] [address]    Report Status 12/31/2018 FINAL  Final  Blood Culture (routine x 2)     Status: None   Collection Time: 12/23/2018  2:38 AM   Specimen: BLOOD  Result Value Ref Range Status   Specimen Description BLOOD RIGHT HAND  Final   Special Requests   Final    BOTTLES DRAWN AEROBIC AND ANAEROBIC Blood Culture adequate volume   Culture   Final    NO GROWTH 5 DAYS Performed at [facility], [address], [place], [place] [address]    Report Status 12/31/2018 FINAL  Final  SARS CORONAVIRUS 2 (TAT 6-24 HRS) Nasopharyngeal Nasopharyngeal Swab     Status: Abnormal   Collection Time: 12/28/18  8:04 AM   Specimen: Nasopharyngeal Swab  Result Value Ref Range Status   SARS Coronavirus 2 POSITIVE (A) NEGATIVE Final    Comment: RESULT CALLED TO, READ BACK BY AND VERIFIED WITH: RN ERICA DODO AT 0300 BY MESSAN HOUEGNIFIO ON 12/29/2018 (NOTE) SARS-CoV-2 target nucleic  acids are DETECTED. The SARS-CoV-2 RNA is generally detectable in upper and lower respiratory specimens during the acute  phase of infection. Positive results are indicative of active infection with SARS-CoV-2. Clinical  correlation with patient history and other diagnostic information is necessary to determine patient infection status. Positive results do  not rule out bacterial infection or co-infection with other viruses. The expected result is Negative. Fact Sheet for Patients: SugarRoll.be Fact Sheet for Healthcare Providers: https://www.[employer]-mathews.com/ This test is not yet approved or cleared by the [place] FDA and  has been authorized for detection and/or diagnosis of SARS-CoV-2 by FDA under an Emergency Use Authorization (EUA). This EUA will remain  in effect (meaning this te st can be used) for the duration of the COVID-19 declaration under Section 564(b)(1) of the Act, 21 U.S.C. section 360bbb-3(b)(1), unless the authorization is terminated or revoked sooner. Performed at [facility], [place] [address] [place], [place] [address]   Respiratory Panel by PCR     Status: None   Collection Time: 12/28/18  8:04 AM   Specimen: Nasopharyngeal Swab; Respiratory  Result Value Ref Range Status   Adenovirus NOT DETECTED NOT DETECTED Final   Coronavirus 229E NOT DETECTED NOT DETECTED Final    Comment: (NOTE) The Coronavirus on the Respiratory Panel, DOES NOT test for the novel  Coronavirus (2019 nCoV)    Coronavirus HKU1 NOT DETECTED NOT DETECTED Final   Coronavirus NL63 NOT DETECTED NOT DETECTED Final   Coronavirus OC43 NOT DETECTED NOT DETECTED Final   Metapneumovirus NOT DETECTED NOT DETECTED Final   Rhinovirus / Enterovirus NOT DETECTED NOT DETECTED Final   Influenza A NOT DETECTED NOT DETECTED Final   Influenza B NOT DETECTED NOT DETECTED Final   Parainfluenza Virus 1 NOT DETECTED NOT DETECTED Final   Parainfluenza Virus  2 NOT DETECTED NOT DETECTED Final   Parainfluenza Virus 3 NOT DETECTED NOT DETECTED Final   Parainfluenza Virus 4 NOT DETECTED NOT DETECTED Final   Respiratory Syncytial Virus NOT DETECTED NOT DETECTED Final   Bordetella pertussis NOT DETECTED NOT DETECTED Final   Chlamydophila pneumoniae NOT DETECTED NOT DETECTED Final   Mycoplasma pneumoniae NOT DETECTED NOT DETECTED Final    Comment: Performed at [facility], [place]. [address], [place], [place] [address]         Radiology Studies: No results found.      Scheduled Meds: . sodium chloride   Intravenous Once  . amLODipine  5 mg Oral Daily  . dexamethasone (DECADRON) injection  6 mg Intravenous Q24H  . enoxaparin (LOVENOX) injection  40 mg Subcutaneous Daily  . ezetimibe  10 mg Oral Daily  . famotidine  20 mg Oral BID  . influenza vaccine adjuvanted  0.5 mL Intramuscular Tomorrow-1000  . insulin aspart  0-20 Units Subcutaneous Q4H  . insulin glargine  8 Units Subcutaneous Daily  . Ipratropium-Albuterol  1 puff Inhalation Q6H  . levothyroxine  50 mcg Oral QAC breakfast  . metoprolol succinate  25 mg Oral Daily  . pneumococcal 23 valent vaccine  0.5 mL Intramuscular Tomorrow-1000  . polyethylene glycol  17 g Oral BID  . sodium chloride flush  3 mL Intravenous Q12H  . vitamin C  500 mg Oral Daily  . zinc sulfate  220 mg Oral Daily   Continuous Infusions: . sodium chloride       LOS: 6 days   The patient is critically ill with multiple organ systems failure and requires high complexity decision making for assessment and support, frequent evaluation and titration of therapies, application of advanced monitoring technologies and extensive interpretation of multiple databases.  Critical Care Time devoted to patient care services described in this note  Time spent: 40 minutes     Jaeli Grubb, Geraldo Docker, MD [facility] Pager [phone]  If 7PM-7AM, please contact night-coverage [email or web address] Password [place]  01/01/2019, 9:36 AM

## 2019-01-02 ENCOUNTER — Encounter (HOSPITAL_COMMUNITY): Payer: Self-pay | Admitting: Primary Care

## 2019-01-02 DIAGNOSIS — Z7189 Other specified counseling: Secondary | ICD-10-CM

## 2019-01-02 DIAGNOSIS — U071 COVID-19: Principal | ICD-10-CM

## 2019-01-02 DIAGNOSIS — Z515 Encounter for palliative care: Secondary | ICD-10-CM

## 2019-01-02 DIAGNOSIS — J1289 Other viral pneumonia: Secondary | ICD-10-CM

## 2019-01-02 LAB — COMPREHENSIVE METABOLIC PANEL
ALT: 15 U/L (ref 0–44)
AST: 13 U/L — ABNORMAL LOW (ref 15–41)
Albumin: 3.3 g/dL — ABNORMAL LOW (ref 3.5–5.0)
Alkaline Phosphatase: 65 U/L (ref 38–126)
Anion gap: 13 (ref 5–15)
BUN: 50 mg/dL — ABNORMAL HIGH (ref 8–23)
CO2: 29 mmol/L (ref 22–32)
Calcium: 8.8 mg/dL — ABNORMAL LOW (ref 8.9–10.3)
Chloride: 94 mmol/L — ABNORMAL LOW (ref 98–111)
Creatinine, Ser: 0.73 mg/dL (ref 0.44–1.00)
GFR calc Af Amer: >60 mL/min (ref >60)
GFR calc non Af Amer: >60 mL/min (ref >60)
Glucose, Bld: 95 mg/dL (ref 70–99)
Potassium: 4.2 mmol/L (ref 3.5–5.1)
Sodium: 136 mmol/L (ref 135–145)
Total Bilirubin: 0.6 mg/dL (ref 0.3–1.2)
Total Protein: 6.3 g/dL — ABNORMAL LOW (ref 6.5–8.1)

## 2019-01-02 LAB — GLUCOSE, CAPILLARY
Glucose-Capillary: 115 mg/dL — ABNORMAL HIGH (ref 70–99)
Glucose-Capillary: 100 mg/dL — ABNORMAL HIGH (ref 70–99)
Glucose-Capillary: 304 mg/dL — ABNORMAL HIGH (ref 70–99)
Glucose-Capillary: 201 mg/dL — ABNORMAL HIGH (ref 70–99)
Glucose-Capillary: 222 mg/dL — ABNORMAL HIGH (ref 70–99)
Glucose-Capillary: 182 mg/dL — ABNORMAL HIGH (ref 70–99)

## 2019-01-02 LAB — CBC WITH DIFFERENTIAL/PLATELET
Abs Immature Granulocytes: 0.78 10*3/uL — ABNORMAL HIGH (ref 0.00–0.07)
Basophils Absolute: 0.1 10*3/uL (ref 0.0–0.1)
Basophils Relative: 1 %
Eosinophils Absolute: 0 10*3/uL (ref 0.0–0.5)
Eosinophils Relative: 0 %
HCT: 45 % (ref 36.0–46.0)
Hemoglobin: 14.7 g/dL (ref 12.0–15.0)
Immature Granulocytes: 4 %
Lymphocytes Relative: 9 %
Lymphs Abs: 1.9 10*3/uL (ref 0.7–4.0)
MCH: 29.1 pg (ref 26.0–34.0)
MCHC: 32.7 g/dL (ref 30.0–36.0)
MCV: 89.1 fL (ref 80.0–100.0)
Monocytes Absolute: 1.7 10*3/uL — ABNORMAL HIGH (ref 0.1–1.0)
Monocytes Relative: 8 %
Neutro Abs: 15.6 10*3/uL — ABNORMAL HIGH (ref 1.7–7.7)
Neutrophils Relative %: 78 %
Platelets: 419 10*3/uL — ABNORMAL HIGH (ref 150–400)
RBC: 5.05 MIL/uL (ref 3.87–5.11)
RDW: 14.2 % (ref 11.5–15.5)
WBC: 20.1 10*3/uL — ABNORMAL HIGH (ref 4.0–10.5)
nRBC: 0 % (ref 0.0–0.2)

## 2019-01-02 LAB — C-REACTIVE PROTEIN: CRP: 0.8 mg/dL (ref <1.0)

## 2019-01-02 LAB — MAGNESIUM: Magnesium: 2.3 mg/dL (ref 1.7–2.4)

## 2019-01-02 LAB — D-DIMER, QUANTITATIVE: D-Dimer, Quant: 1.14 ug/mL-FEU — ABNORMAL HIGH (ref 0.00–0.50)

## 2019-01-02 LAB — PHOSPHORUS: Phosphorus: 3.9 mg/dL (ref 2.5–4.6)

## 2019-01-02 LAB — FERRITIN: Ferritin: 249 ng/mL (ref 11–307)

## 2019-01-02 LAB — LACTATE DEHYDROGENASE: LDH: 330 U/L — ABNORMAL HIGH (ref 98–192)

## 2019-01-02 NOTE — Consult Note (Signed)
Consultation Note Date: 01/02/2019   Patient Name: Kristin Estrada  DOB: [birth year]  MRN: [id number]  Age / Sex: 78 y.o., female  PCP: Frazier Richards, MD Referring Physician: Allie Bossier, MD  Reason for Consultation: Establishing goals of care and Psychosocial/spiritual support  HPI/Patient Profile: 79 y.o. female  with past medical history of COPD, chronic back pain, diabetes, GERD, fibromyalgia, hypothyroidism,HTN/HLD, history of breast cancer left 2019, hearing loss, insomnia takes Ambien and trazodone nightly, macular degeneration, admitted on 12/28/2018 with pneumonia due to COVID-19.   Clinical Assessment and Goals of Care: Telephonic consult due to Covid status.  Chart review completed, conference with attending related to patient condition.  Call to Kristin Estrada in her room, she is unable to hear me and hangs up. Call again, but line in busy.    Call to son, Kristin Estrada at [phone]. No answer, unable to leave VM message.   Detailed conversation with attending related to patient condition, needs, code status discussions.   PMT to continue to follow.    HCPOA     NEXT OF KIN -son, Kristin Estrada.   SUMMARY OF RECOMMENDATIONS   Continue to treat the treatable.  Continue code status discussions.   Code Status/Advance Care Planning:  Full code  Symptom Management:   Per hospitalist, no additional needs at this time.  Palliative Prophylaxis:   Frequent Pain Assessment and Turn Reposition  Additional Recommendations (Limitations, Scope, Preferences):  Full Scope Treatment  Psycho-social/Spiritual:   Desire for further Chaplaincy support:no  Additional Recommendations: Caregiving  Support/Resources and Education on Hospice  Prognosis:  Unable to determine, based on outcomes.  Condition guarded.  Discharge Planning: To be determined, based on outcomes.       Primary Diagnoses: Present on Admission: . Pneumonia due to COVID-19 virus . Acute respiratory failure due to COVID-19 ([place]) . Acute respiratory failure with hypoxia and hypercarbia (HCC) . Chronic diastolic CHF (congestive heart failure) ([place]) . Essential hypertension . 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 Primary cancer of lower outer quadrant of left female breast ([place]) . HLD (hyperlipidemia) . Diabetes mellitus type 2, uncontrolled, with complications ([place]) . Glaucoma   I have reviewed the medical record, interviewed the patient and family, and examined the patient. The following aspects are pertinent.  Past Medical History:  Diagnosis Date  . Arthritis   . Bronchitis   . Bruises easily   . Cancer (HCC)    breast  . Cataracts, bilateral   . Chronic airway obstruction, not elsewhere classified    Symbicort daily and Albuterol daily as needed  . Chronic back pain    arthritis  . Chronic bronchitis ([place])   . Cramps of left lower extremity   . Diabetes mellitus without complication ([place])    borderline-diet and exercise;takes Cinnamon daily  . Esophageal reflux    takes Nexium daily and Dexilant daily as needed  . Fibromyalgia   . Fibromyalgia   . Headache    occasionally  . HOH (hard  of hearing)   . Hyperlipidemia    takes CO Q10 daily  . Hypothyroidism   . Insomnia    takes Ambien and Trazodone nightly  . Joint pain   . Joint swelling   . Macular degeneration, right eye    wet and to get injection tomorrow  . Pneumonia    hx of x 3 with most recent one 2009  . Seasonal allergies    takes Zyrtec daily as well as using Nasonex  . Shortness of breath dyspnea   . Shoulder fracture   . Unspecified essential hypertension    takes Amlodipine and Losartan daily  . URI (upper respiratory infection)    was on Prednisone and Antibiotic-to complete today  . Urinary urgency    Social History   Socioeconomic History  . Marital status:  Widowed    Spouse name: Not on file  . Number of children: Not on file  . Years of education: Not on file  . Highest education level: Not on file  Occupational History  . Occupation: Retired  Scientific laboratory technician  . Financial resource strain: Not on file  . Food insecurity    Worry: Not on file    Inability: Not on file  . Transportation needs    Medical: Not on file    Non-medical: Not on file  Tobacco Use  . Smoking status: Former Smoker    Packs/day: 2.00    Years: 30.00    Pack years: 60.00    Types: Cigarettes    Quit date: 02/17/2004    Years since quitting: 14.8  . Smokeless tobacco: Never Used  . Tobacco comment: quit smoking in 2006  Substance and Sexual Activity  . Alcohol use: No    Alcohol/week: 0.0 standard drinks  . Drug use: No  . Sexual activity: Not on file  Lifestyle  . Physical activity    Days per week: Not on file    Minutes per session: Not on file  . Stress: Not on file  Relationships  . Social Herbalist on phone: Not on file    Gets together: Not on file    Attends religious service: Not on file    Active member of club or organization: Not on file    Attends meetings of clubs or organizations: Not on file    Relationship status: Not on file  Other Topics Concern  . Not on file  Social History Narrative  . Not on file   Family History  Problem Relation Age of Onset  . Heart disease Other   . Breast cancer Neg Hx    Scheduled Meds: . sodium chloride   Intravenous Once  . amLODipine  5 mg Oral Daily  . dexamethasone (DECADRON) injection  6 mg Intravenous Q24H  . ezetimibe  10 mg Oral Daily  . famotidine  20 mg Oral BID  . furosemide  60 mg Intravenous BID  . influenza vaccine adjuvanted  0.5 mL Intramuscular Tomorrow-1000  . insulin aspart  0-20 Units Subcutaneous Q4H  . insulin glargine  8 Units Subcutaneous Daily  . Ipratropium-Albuterol  1 puff Inhalation Q6H  . levothyroxine  50 mcg Oral QAC breakfast  . metoprolol succinate   25 mg Oral Daily  . pneumococcal 23 valent vaccine  0.5 mL Intramuscular Tomorrow-1000  . polyethylene glycol  17 g Oral BID  . sodium chloride flush  3 mL Intravenous Q12H  . vitamin C  500 mg Oral Daily  . zinc  sulfate  220 mg Oral Daily   Continuous Infusions: . sodium chloride     PRN Meds:.sodium chloride, acetaminophen, chlorpheniramine-HYDROcodone, diazepam, guaiFENesin-dextromethorphan, magnesium oxide, ondansetron **OR** ondansetron (ZOFRAN) IV, polyvinyl alcohol, sodium chloride flush Medications Prior to Admission:  Prior to Admission medications   Medication Sig Start Date End Date Taking? Authorizing Provider  acetaminophen (TYLENOL) 500 MG tablet Take 500-1,000 mg by mouth every 6 (six) hours as needed for moderate pain or headache.    Yes [provider]  albuterol (PROAIR HFA) 108 (90 BASE) MCG/ACT inhaler Inhale 2 puffs into the lungs every 4 (four) hours as needed. Patient taking differently: Inhale 2 puffs into the lungs every 4 (four) hours as needed for wheezing or shortness of breath.  12/26/13  Yes [name], MD  amLODipine (NORVASC) 5 MG tablet Take 5 mg by mouth daily.   Yes [provider]  Bevacizumab (AVASTIN) 100 MG/4ML SOLN Place 1 Dose into both eyes every 30 (thirty) days.    Yes [provider]  budesonide-formoterol (SYMBICORT) 160-4.5 MCG/ACT inhaler Inhale 2 puffs into the lungs 2 (two) times daily.   Yes [provider]  cetirizine (ZYRTEC) 10 MG tablet Take 10 mg by mouth at bedtime.  03/24/11  Yes [name], MD  cholecalciferol (VITAMIN D) 1000 UNITS tablet Take 1,000 Units by mouth daily.   Yes [provider]  clotrimazole (LOTRIMIN) 1 % cream Apply 1 application topically 2 (two) times daily as needed (for yeast infection).   Yes [provider]  Coenzyme Q10 (CO Q 10) 10 MG CAPS Take 10 mg by mouth daily.    Yes [provider]  cyclobenzaprine (FLEXERIL) 5 MG tablet Take 5  mg by mouth 3 (three) times daily as needed for muscle spasms.   Yes [provider]  diazepam (VALIUM) 5 MG tablet Take 0.5-1 tablets (2.5-5 mg total) by mouth every 6 (six) hours as needed for muscle spasms or sedation. Patient taking differently: Take 5 mg by mouth at bedtime.  04/20/14  Yes Shuford, Olivia Mackie, PA-C  diclofenac (VOLTAREN) 75 MG EC tablet Take 75 mg by mouth 2 (two) times daily.     Yes [provider]  esomeprazole (NEXIUM) 40 MG capsule Take 40 mg by mouth daily at 12 noon.   Yes [provider]  Homeopathic Products ([employer] LEG CRAMPS PAIN RELIEF PO) Take 2 tablets by mouth every 4 (four) hours as needed (for leg cramps).    Yes [provider]  Ipratropium-Albuterol (COMBIVENT) 20-100 MCG/ACT AERS respimat Inhale 1 puff into the lungs every 6 (six) hours. 12/25/2018  Yes Lavina Hamman, MD  levofloxacin (LEVAQUIN) 500 MG tablet Take 500 mg by mouth daily. For 7 days  Started on 12-19-18 12/19/18  Yes [provider]  levothyroxine (SYNTHROID) 50 MCG tablet Take 50 mcg by mouth daily before breakfast.    Yes [provider]  losartan (COZAAR) 100 MG tablet Take 100 mg by mouth daily.    Yes [provider]  Magnesium 400 MG CAPS Take 400 mg by mouth See admin instructions. Take 400 mg by mouth in the morning every day and take 400 mg by mouth 3 times weekly at bedtime   Yes [provider]  metoprolol succinate (TOPROL-XL) 25 MG 24 hr tablet Take 25 mg by mouth daily.   Yes [provider]  Misc Natural Products (GLUCOSAMINE CHOND DOUBLE STR PO) Take 1 tablet by mouth daily.   Yes [provider]  Multiple Vitamin (MULTIVITAMIN WITH MINERALS) TABS tablet Take 1 tablet by mouth daily.   Yes [provider]  Multiple Vitamins-Minerals (ICAPS) CAPS Take 1 capsule by mouth daily.   Yes [provider]  nystatin (MYCOSTATIN) 100000 UNIT/ML suspension Take 5 mLs (500,000 Units total) by  mouth as needed. Patient taking differently: Take 5 mLs by mouth daily as needed (for mouth due to inhaler).  03/27/16  Yes Wilhelmina Mcardle, MD  Polyethyl Glycol-Propyl Glycol (SYSTANE OP) Place 1 drop into both eyes 3 (three) times daily as needed (for dry eyes (at least once daily)).    Yes [provider]  vitamin B-12 (CYANOCOBALAMIN) 1000 MCG tablet Take 1,000 mcg by mouth daily.   Yes [provider]  vitamin C (ASCORBIC ACID) 500 MG tablet Take 500 mg by mouth daily.   Yes [provider]  famotidine (PEPCID) 20 MG tablet Take 1 tablet (20 mg total) by mouth 2 (two) times daily. When coughing Patient not taking: Reported on 01/16/2019 01/29/14   [name], MD  furosemide (LASIX) 10 MG/ML injection Inject 6 mLs (60 mg total) into the vein 2 (two) times daily. 12/28/2018   Lavina Hamman, MD  methylPREDNISolone sodium succinate (SOLU-MEDROL) 125 mg/2 mL injection Inject 0.96 mLs (60 mg total) into the vein every 6 (six) hours. 01/09/2019   Lavina Hamman, MD  tamoxifen (NOLVADEX) 20 MG tablet Take 1 tablet (20 mg total) by mouth daily. Patient not taking: Reported on 12/25/2018 03/04/18   Lloyd Huger, MD  fluticasone (VERAMYST) 27.5 MCG/SPRAY nasal spray Place 2 sprays into the nose daily as needed. 12/16/10 04/28/11  [name], MD  prochlorperazine (COMPAZINE) 10 MG tablet Take 1 tablet (10 mg total) by mouth every 6 (six) hours as needed (Nausea or vomiting). Patient not taking: Reported on 08/05/2017 07/30/17 08/08/17  Lloyd Huger, MD   Allergies  Allergen Reactions  . Protonix [Pantoprazole Sodium] Other (See Comments)    abd cramps  . Prednisone Other (See Comments)    Agitation, insomina, mood change. Tolerates injection- not PO Agitation, insomina, mood change. Tolerates injection- not PO  . Statins Other (See Comments)    Legs hurt Legs hurt Legs hurt   Review of Systems  Unable to perform ROS: Acuity of condition    Physical  Exam Vitals signs and nursing note reviewed.  Constitutional:      Comments: Telephonic consult due to Covid diagnosis     Vital Signs: BP (!) 136/46 (BP Location: Right Arm)   Pulse 85   Temp 98.2 F (36.8 C) (Oral)   Resp (!) 23   Ht 5\' 2"  (1.575 m)   Wt 81.4 kg Comment: Standing  SpO2 91%   BMI 32.82 kg/m  Pain Scale: 0-10 POSS *See Group Information*: 1-Acceptable,Awake and alert Pain Score: 0-No pain   SpO2: SpO2: 91 % O2 Device:SpO2: 91 % O2 Flow Rate: .O2 Flow Rate (L/min): 15 L/min  IO: Intake/output summary:   Intake/Output Summary (Last 24 hours) at 01/02/2019 1436 Last data filed at 01/02/2019 [id number] Gross per 24 hour  Intake 480 ml  Output 1100 ml  Net -620 ml    LBM: Last BM Date: 01/02/19 Baseline Weight: Weight: 80.6 kg Most recent weight: Weight: 81.4 kg(Standing)     Palliative Assessment/Data:   Flowsheet Rows     Most Recent Value  Intake Tab  Referral Department  Hospitalist  Unit at Time of Referral  ICU  Palliative Care Primary Diagnosis  Pulmonary  Date Notified  12/31/18  Palliative Care Type  New Palliative care  Reason for referral  Clarify Goals of Care  Date of Admission  12/19/2018  Date first seen by Palliative Care  01/02/19  # of days Palliative referral response time  2 Day(s)  # of days IP prior to Palliative referral  5  Clinical Assessment  Palliative Performance Scale Score  40%  Pain Max last 24 hours  Not able to report  Pain Min Last 24 hours  Not able to report  Dyspnea Max Last 24 Hours  Not able to report  Dyspnea Min Last 24 hours  Not able to report  Psychosocial & Spiritual Assessment  Palliative Care Outcomes      Time In: 1420 Time Out: 1450 Time Total: 30 minutes  Greater than 50%  of this time was spent counseling and coordinating care related to the above assessment and plan.  Signed by: Drue Novel, NP   Please contact Palliative Medicine Team phone at [phone] for questions and concerns.  For  individual provider: See Shea Evans

## 2019-01-02 NOTE — Progress Notes (Signed)
Spoke with patient's son [name] to update him on patient's status. All family's questions answered at this time.

## 2019-01-02 NOTE — Progress Notes (Signed)
PROGRESS NOTE    Kristin Estrada  [id number] DOB: December 10, 1940 DOA: 01/07/2019 PCP: Frazier Richards, MD   Brief Narrative:  78 y.o. WF PMHx Fibromyalgia, chronic pain syndrome (back), COPD, diabetes type 2 uncontrolled with complication, HLD,, chronic diastolic CHF, left breast cancer ER positive (negative XRT/chemotherapy)  Sent to ED for sob and hypoxia.  She has been worsening for several days with a lot of coughing.  No cp or abd pain.  No n/v/d.  dound to have covid pna and referred for admission for such. Feeling better on nrb.  Was prev on bipap now off.  mentating normally at this time.   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   Primary cancer of lower outer quadrant of left female breast ([place])   Acute respiratory failure due to COVID-19 [facility])   Acute respiratory failure with hypoxia and hypercarbia (HCC)   Chronic diastolic CHF (congestive heart failure) (HCC)   HLD (hyperlipidemia)   Diabetes mellitus type 2, uncontrolled, with complications (HCC)   Glaucoma   Covid pneumonia/acute respiratory failure with hypoxia COVID-19 Labs  Recent Labs    12/31/18 0039 01/01/19 0255 01/01/19 1000 01/02/19 0517  DDIMER 1.22*  --  1.51*  --   FERRITIN 215 238  --   --   LDH 327* 383*  --  330*  CRP 3.5*  --  1.2*  --   -Combivent QID -Discontinue Solu-Medrol changed to Decadron 6 mg daily -Remdesivir per pharmacy protocol -Flutter valve -Prone patient 16 hours/day, if patient unable to tolerate prone patient 2 to 3 hours per shift. -11/12 Actemra x1 dose -11/12 transfuse 1 unit Covid convalescent plasma  Lab Results  Component Value Date   SARSCOV2NAA POSITIVE (A) 12/28/2018    COPD -Secondary to long history of  smoking -See Covid pneumonia  Chronic diastolic CHF -We will slowly restart home CHF medication -Amlodipine 5 mg daily -11/11 Toprol 25 mg daily -Echocardiogram grade 1 diastolic dysfunction see results below -Strict in and out +780..7 L -Daily weight Filed Weights   01/01/19 0447 01/02/19 0431 01/02/19 0432  Weight: 79.3 kg 81.4 kg 81.4 kg  -11/13 Albumin 25 g  + Lasix IV 60 mg x 1 (keep patient net even) -11/15 Lasix IV 60 mg BID -11/15 PCXR pending  Essential HTN -See CHF  Diabetes type 2 uncontrolled with complication -[id number] hemoglobin A1c= 7.1 -Lantus 8 units daily -Resistant SSI  HLD -11/12 LDL= 145 -Unable to use statin secondary to patient having leg pain with its use. -11/13 Zetia 10 mg daily  Glaucoma -Liquifilm Tears  Hypokalemia -Potassium goal> 4   Goals of care -11/14 palliative care consult; discuss with patient DNR given the seriousness of her disease process.  Will await recommendation     DVT prophylaxis: Lovenox per pharmacy Covid protocol Code Status: Full Family Communication: 11/14 was informed by colleague that [name] (son) also here hospitalized at [facility]  Disposition Plan: TBD    Consultants:  None   Procedures/Significant Events:  11/11 echocardiogram;Left Ventricle: EF = 65 to 70%.  -Grade I diastolic dysfunction (impaired relaxation).  11/12 PCXR;I-Persistent prominent bilateral pulmonary interstitial prominence consistent with active interstitial process including pneumonitis and/or interstitial edema, slightly (I believe appear slightly worse than previous exam) 11/12 transfuse 1 unit Covid  convalescent plasma     I have personally reviewed and interpreted all radiology studies and my findings are as above.  VENTILATOR SETTINGS: HFNC Flow; 14 L/min SPO2 92%    Cultures 11/11 SARS coronavirus positive 11/11 respiratory virus panel negative      Antimicrobials: Anti-infectives (From admission, onward)   Start      Stop   12/28/18 1000  remdesivir 100 mg in sodium chloride 0.9 % 250 mL IVPB  Status:  Discontinued     12/27/18 1320   12/27/18 1600  remdesivir 100 mg in sodium chloride 0.9 % 250 mL IVPB     12/31/18 1559       Devices    LINES / TUBES:      Continuous Infusions: . sodium chloride       Objective: Vitals:   01/02/19 0431 01/02/19 0432 01/02/19 0434 01/02/19 0802  BP:   (!) 128/55 (!) 138/57  Pulse:   63 78  Resp:   15 19  Temp:   98.2 F (36.8 C) 98.1 F (36.7 C)  TempSrc:   Axillary Oral  SpO2:   96% 92%  Weight: 81.4 kg 81.4 kg    Height:        Intake/Output Summary (Last 24 hours) at 01/02/2019 0840 Last data filed at 01/01/2019 1853 Gross per 24 hour  Intake 360 ml  Output 1 ml  Net 359 ml   Filed Weights   01/01/19 0447 01/02/19 0431 01/02/19 0432  Weight: 79.3 kg 81.4 kg 81.4 kg   Physical Exam:  General: A/O x4, positive acute respiratory distress Eyes: negative scleral hemorrhage, negative anisocoria, negative icterus ENT: Negative Runny nose, negative gingival bleeding, Neck:  Negative scars, masses, torticollis, lymphadenopathy, JVD Lungs: Clear to auscultation bilaterally without wheezes or crackles Cardiovascular: Regular rate and rhythm without murmur gallop or rub normal S1 and S2 Abdomen: negative abdominal pain, nondistended, positive soft, bowel sounds, no rebound, no ascites, no appreciable mass Extremities: No significant cyanosis, clubbing, or edema bilateral lower extremities Skin: Negative rashes, lesions, ulcers Psychiatric:  Negative depression, positive anxiety, negative fatigue, negative mania  Central nervous system:  Cranial nerves II through XII intact, tongue/uvula midline, all extremities muscle strength 5/5, sensation intact throughout, negative dysarthria, negative expressive aphasia, negative receptive aphasia.   Data Reviewed: Care during the described time interval was provided by me .  I have reviewed this  patient's available data, including medical history, events of note, physical examination, and all test results as part of my evaluation.   CBC: Recent Labs  Lab 12/29/18 0330 12/30/18 0000 12/31/18 0039 01/01/19 1000 01/02/19 0517  WBC 19.1* 15.0* 12.8* 19.5* 20.1*  NEUTROABS 14.3* 11.5* 9.9* 15.9* 15.6*  HGB 14.6 13.8 12.7 15.0 14.7  HCT 46.0 42.6 39.2 46.6* 45.0  MCV 91.1 90.8 90.3 91.0 89.1  PLT 446* 394 381 479* [id number]*   Basic Metabolic Panel: Recent Labs  Lab 12/29/18 0330 12/29/18 1435 12/30/18 0000 12/31/18 0039 01/01/19 0255 01/01/19 1000 01/02/19 0517  NA 142  --  141 141  --  135 136  K 3.1* 4.4 3.9 3.6  --  3.9 4.2  CL 99  --  100 98  --  95* 94*  CO2 31  --  29 32  --  28 29  GLUCOSE 71  --  116* 135*  --  328* 95  BUN 47*  --  42* 37*  --  50* 50*  CREATININE 0.71  --  0.59 0.63  --  1.03* 0.73  CALCIUM 8.8*  --  8.3* 8.7*  --  8.9 8.8*  MG 2.6* 2.3 2.4 2.2 2.4  --  2.3  PHOS 2.3*  --  2.8 3.4 3.5  --  3.9   GFR: Estimated Creatinine Clearance: 55.4 mL/min (by C-G formula based on SCr of 0.73 mg/dL). Liver Function Tests: Recent Labs  Lab 12/29/18 0330 12/30/18 0000 12/31/18 0039 01/01/19 1000 01/02/19 0517  AST 18 14* 13* 17 13*  ALT 20 17 59* 17 15  ALKPHOS 75 71 67 75 65  BILITOT 0.4 0.7 0.7 1.0 0.6  PROT 6.7 6.2* 6.5 7.3 6.3*  ALBUMIN 2.9* 2.8* 3.6 3.7 3.3*   No results for input(s): LIPASE, AMYLASE in the last 168 hours. No results for input(s): AMMONIA in the last 168 hours. Coagulation Profile: No results for input(s): INR, PROTIME in the last 168 hours. Cardiac Enzymes: No results for input(s): CKTOTAL, CKMB, CKMBINDEX, TROPONINI in the last 168 hours. BNP (last 3 results) No results for input(s): PROBNP in the last 8760 hours. HbA1C: No results for input(s): HGBA1C in the last 72 hours. CBG: Recent Labs  Lab 12/30/18 0724 12/30/18 2209 12/31/18 2356 01/02/19 0425 01/02/19 0723  GLUCAP 122* 181* 154* 115* 100*   Lipid  Profile: No results for input(s): CHOL, HDL, LDLCALC, TRIG, CHOLHDL, LDLDIRECT in the last 72 hours. Thyroid Function Tests: No results for input(s): TSH, T4TOTAL, FREET4, T3FREE, THYROIDAB in the last 72 hours. Anemia Panel: Recent Labs    12/31/18 0039 01/01/19 0255  FERRITIN 215 238   Urine analysis: No results found for: COLORURINE, APPEARANCEUR, LABSPEC, PHURINE, GLUCOSEU, HGBUR, BILIRUBINUR, KETONESUR, PROTEINUR, UROBILINOGEN, NITRITE, LEUKOCYTESUR Sepsis Labs: @LABRCNTIP (procalcitonin:4,lacticidven:4)  ) Recent Results (from the past 240 hour(s))  Blood Culture (routine x 2)     Status: None   Collection Time: 01/11/2019  2:38 AM   Specimen: BLOOD  Result Value Ref Range Status   Specimen Description BLOOD LEFT FOREARM  Final   Special Requests   Final    BOTTLES DRAWN AEROBIC AND ANAEROBIC Blood Culture adequate volume   Culture   Final    NO GROWTH 5 DAYS Performed at [facility], [address], [place], [place] [address]    Report Status 12/31/2018 FINAL  Final  Blood Culture (routine x 2)     Status: None   Collection Time: 12/27/2018  2:38 AM   Specimen: BLOOD  Result Value Ref Range Status   Specimen Description BLOOD RIGHT HAND  Final   Special Requests   Final    BOTTLES DRAWN AEROBIC AND ANAEROBIC Blood Culture adequate volume   Culture   Final    NO GROWTH 5 DAYS Performed at [facility], [address], [place], [place] [address]    Report Status 12/31/2018 FINAL  Final  SARS CORONAVIRUS 2 (TAT 6-24 HRS) Nasopharyngeal Nasopharyngeal Swab     Status: Abnormal   Collection Time: 12/28/18  8:04 AM   Specimen: Nasopharyngeal Swab  Result Value Ref Range Status   SARS Coronavirus 2 POSITIVE (A) NEGATIVE Final    Comment: RESULT CALLED TO, READ BACK BY AND VERIFIED WITH: RN ERICA DODO AT 0300 BY MESSAN HOUEGNIFIO ON 12/29/2018 (NOTE) SARS-CoV-2 target nucleic acids are DETECTED. The SARS-CoV-2 RNA is generally detectable in upper and  lower respiratory specimens during the acute phase of infection. Positive results are indicative of active infection with SARS-CoV-2. Clinical  correlation with patient history and other diagnostic information is necessary to determine patient infection status.  Positive results do  not rule out bacterial infection or co-infection with other viruses. The expected result is Negative. Fact Sheet for Patients: SugarRoll.be Fact Sheet for Healthcare Providers: https://www.woods-mathews.com/ This test is not yet approved or cleared by the [place] FDA and  has been authorized for detection and/or diagnosis of SARS-CoV-2 by FDA under an Emergency Use Authorization (EUA). This EUA will remain  in effect (meaning this te st can be used) for the duration of the COVID-19 declaration under Section 564(b)(1) of the Act, 21 U.S.C. section 360bbb-3(b)(1), unless the authorization is terminated or revoked sooner. Performed at [facility], [place] [address], [place], [place] [address]   Respiratory Panel by PCR     Status: None   Collection Time: 12/28/18  8:04 AM   Specimen: Nasopharyngeal Swab; Respiratory  Result Value Ref Range Status   Adenovirus NOT DETECTED NOT DETECTED Final   Coronavirus 229E NOT DETECTED NOT DETECTED Final    Comment: (NOTE) The Coronavirus on the Respiratory Panel, DOES NOT test for the novel  Coronavirus (2019 nCoV)    Coronavirus HKU1 NOT DETECTED NOT DETECTED Final   Coronavirus NL63 NOT DETECTED NOT DETECTED Final   Coronavirus OC43 NOT DETECTED NOT DETECTED Final   Metapneumovirus NOT DETECTED NOT DETECTED Final   Rhinovirus / Enterovirus NOT DETECTED NOT DETECTED Final   Influenza A NOT DETECTED NOT DETECTED Final   Influenza B NOT DETECTED NOT DETECTED Final   Parainfluenza Virus 1 NOT DETECTED NOT DETECTED Final   Parainfluenza Virus 2 NOT DETECTED NOT DETECTED Final   Parainfluenza Virus 3 NOT DETECTED NOT  DETECTED Final   Parainfluenza Virus 4 NOT DETECTED NOT DETECTED Final   Respiratory Syncytial Virus NOT DETECTED NOT DETECTED Final   Bordetella pertussis NOT DETECTED NOT DETECTED Final   Chlamydophila pneumoniae NOT DETECTED NOT DETECTED Final   Mycoplasma pneumoniae NOT DETECTED NOT DETECTED Final    Comment: Performed at [facility], [place]. [address], [place], [place] [address]         Radiology Studies: Dg Chest Port 1 View  Result Date: 01/01/2019 CLINICAL DATA:  COVID positive, shortness of breath EXAM: PORTABLE CHEST 1 VIEW COMPARISON:  12/29/2018 FINDINGS: No significant change in diffuse bilateral interstitial pulmonary opacity, superimposed upon emphysema. No new airspace opacity. Cardiomegaly. IMPRESSION: 1. No significant change in diffuse bilateral interstitial pulmonary opacity, superimposed upon emphysema. No new airspace opacity. Findings remain consistent with infection or edema. 2.  Cardiomegaly. Electronically Signed   By: Eddie Candle M.D.   On: 01/01/2019 16:11        Scheduled Meds: . sodium chloride   Intravenous Once  . amLODipine  5 mg Oral Daily  . dexamethasone (DECADRON) injection  6 mg Intravenous Q24H  . ezetimibe  10 mg Oral Daily  . famotidine  20 mg Oral BID  . furosemide  60 mg Intravenous BID  . influenza vaccine adjuvanted  0.5 mL Intramuscular Tomorrow-1000  . insulin aspart  0-20 Units Subcutaneous Q4H  . insulin glargine  8 Units Subcutaneous Daily  . Ipratropium-Albuterol  1 puff Inhalation Q6H  . levothyroxine  50 mcg Oral QAC breakfast  . metoprolol succinate  25 mg Oral Daily  . pneumococcal 23 valent vaccine  0.5 mL Intramuscular Tomorrow-1000  . polyethylene glycol  17 g Oral BID  . sodium chloride flush  3 mL Intravenous Q12H  . vitamin C  500 mg Oral Daily  . zinc sulfate  220 mg Oral Daily  Continuous Infusions: . sodium chloride       LOS: 7 days   The patient is critically ill with multiple organ systems  failure and requires high complexity decision making for assessment and support, frequent evaluation and titration of therapies, application of advanced monitoring technologies and extensive interpretation of multiple databases. Critical Care Time devoted to patient care services described in this note  Time spent: 40 minutes     WOODS, Geraldo Docker, MD [facility] Pager [phone]  If 7PM-7AM, please contact night-coverage [email or web address] Password [email or web address] 01/02/2019, 8:40 AM

## 2019-01-02 NOTE — Plan of Care (Signed)
Teaching continued with patient and son this AM, family teaching done via phone. All questions answered at this time. Emotional support provided to patient and family, patient seems to be in good spirits today. Remains on 15L HFNC, no c/o of SOB at this time. Respirations unlabored at this ime. Bilateral lung sounds diminished throughout with some fine crackles noted to posterior mid-bases. Continuing with 60mg  IV lasix. D/c planning ongoing, pt still not appropriate for d/c at this time. VSS. Afebrile. NSR with occasional PVCs noted. Encouraging OOB to [facility] and chair, ambulating short distances in room as tolerated. Full liquid diet, good PO intake at this time. Vdg adequate amts on BSC. +BS/+BM 01/02/19. No c/o pain at this time. Safe environment of care maintained. Mepilex to sacrum intact, no skin breakdown noted. Will continue to monitor.

## 2019-01-02 NOTE — Progress Notes (Addendum)
Inpatient Diabetes Program Recommendations  AACE/ADA: New Consensus Statement on Inpatient Glycemic Control (2015)  Target Ranges:  Prepandial:   less than 140 mg/dL      Peak postprandial:   less than 180 mg/dL (1-2 hours)      Critically ill patients:  140 - 180 mg/dL   Lab Results  Component Value Date   GLUCAP 100 (H) 01/02/2019   HGBA1C 7.1 (H) 12/28/2018   Diabetes history: Type 2 DM- Diet controlled Outpatient Diabetes medications: none Current orders for Inpatient glycemic control: Novolog 0-20 units Q4H, Lantus 8 units QD Decadron 6 mg QD Inpatient Diabetes Program Recommendations:    If post pranidals continue to exceed 200's mg/dL, consider adding Novolog 4 units TID (assuming patient is consuming >50% of meal).   Thanks, Bronson Curb, MSN, RNC-OB Diabetes Coordinator [phone] (8a-5p)

## 2019-01-03 DIAGNOSIS — Z7189 Other specified counseling: Secondary | ICD-10-CM

## 2019-01-03 LAB — GLUCOSE, CAPILLARY
Glucose-Capillary: 88 mg/dL (ref 70–99)
Glucose-Capillary: 105 mg/dL — ABNORMAL HIGH (ref 70–99)
Glucose-Capillary: 100 mg/dL — ABNORMAL HIGH (ref 70–99)
Glucose-Capillary: 106 mg/dL — ABNORMAL HIGH (ref 70–99)
Glucose-Capillary: 108 mg/dL — ABNORMAL HIGH (ref 70–99)
Glucose-Capillary: 108 mg/dL — ABNORMAL HIGH (ref 70–99)
Glucose-Capillary: 111 mg/dL — ABNORMAL HIGH (ref 70–99)
Glucose-Capillary: 161 mg/dL — ABNORMAL HIGH (ref 70–99)
Glucose-Capillary: 162 mg/dL — ABNORMAL HIGH (ref 70–99)
Glucose-Capillary: 165 mg/dL — ABNORMAL HIGH (ref 70–99)
Glucose-Capillary: 177 mg/dL — ABNORMAL HIGH (ref 70–99)
Glucose-Capillary: 235 mg/dL — ABNORMAL HIGH (ref 70–99)
Glucose-Capillary: 276 mg/dL — ABNORMAL HIGH (ref 70–99)
Glucose-Capillary: 287 mg/dL — ABNORMAL HIGH (ref 70–99)
Glucose-Capillary: 142 mg/dL — ABNORMAL HIGH (ref 70–99)
Glucose-Capillary: 213 mg/dL — ABNORMAL HIGH (ref 70–99)
Glucose-Capillary: 92 mg/dL (ref 70–99)
Glucose-Capillary: 237 mg/dL — ABNORMAL HIGH (ref 70–99)
Glucose-Capillary: 272 mg/dL — ABNORMAL HIGH (ref 70–99)
Glucose-Capillary: 227 mg/dL — ABNORMAL HIGH (ref 70–99)

## 2019-01-03 LAB — COMPREHENSIVE METABOLIC PANEL
ALT: 15 U/L (ref 0–44)
AST: 15 U/L (ref 15–41)
Albumin: 3.3 g/dL — ABNORMAL LOW (ref 3.5–5.0)
Alkaline Phosphatase: 61 U/L (ref 38–126)
Anion gap: 15 (ref 5–15)
BUN: 43 mg/dL — ABNORMAL HIGH (ref 8–23)
CO2: 29 mmol/L (ref 22–32)
Calcium: 8.7 mg/dL — ABNORMAL LOW (ref 8.9–10.3)
Chloride: 92 mmol/L — ABNORMAL LOW (ref 98–111)
Creatinine, Ser: 0.77 mg/dL (ref 0.44–1.00)
GFR calc Af Amer: >60 mL/min (ref >60)
GFR calc non Af Amer: >60 mL/min (ref >60)
Glucose, Bld: 114 mg/dL — ABNORMAL HIGH (ref 70–99)
Potassium: 3.7 mmol/L (ref 3.5–5.1)
Sodium: 136 mmol/L (ref 135–145)
Total Bilirubin: 0.8 mg/dL (ref 0.3–1.2)
Total Protein: 6.2 g/dL — ABNORMAL LOW (ref 6.5–8.1)

## 2019-01-03 LAB — CBC WITH DIFFERENTIAL/PLATELET
Abs Immature Granulocytes: 0.53 10*3/uL — ABNORMAL HIGH (ref 0.00–0.07)
Basophils Absolute: 0.1 10*3/uL (ref 0.0–0.1)
Basophils Relative: 0 %
Eosinophils Absolute: 0 10*3/uL (ref 0.0–0.5)
Eosinophils Relative: 0 %
HCT: 45.8 % (ref 36.0–46.0)
Hemoglobin: 15 g/dL (ref 12.0–15.0)
Immature Granulocytes: 3 %
Lymphocytes Relative: 10 %
Lymphs Abs: 1.8 10*3/uL (ref 0.7–4.0)
MCH: 29 pg (ref 26.0–34.0)
MCHC: 32.8 g/dL (ref 30.0–36.0)
MCV: 88.4 fL (ref 80.0–100.0)
Monocytes Absolute: 1.9 10*3/uL — ABNORMAL HIGH (ref 0.1–1.0)
Monocytes Relative: 10 %
Neutro Abs: 14 10*3/uL — ABNORMAL HIGH (ref 1.7–7.7)
Neutrophils Relative %: 77 %
Platelets: 431 10*3/uL — ABNORMAL HIGH (ref 150–400)
RBC: 5.18 MIL/uL — ABNORMAL HIGH (ref 3.87–5.11)
RDW: 14.2 % (ref 11.5–15.5)
WBC: 18.3 10*3/uL — ABNORMAL HIGH (ref 4.0–10.5)
nRBC: 0 % (ref 0.0–0.2)

## 2019-01-03 LAB — C-REACTIVE PROTEIN: CRP: <0.8 mg/dL (ref <1.0)

## 2019-01-03 LAB — D-DIMER, QUANTITATIVE: D-Dimer, Quant: 0.96 ug/mL-FEU — ABNORMAL HIGH (ref 0.00–0.50)

## 2019-01-03 LAB — FERRITIN: Ferritin: 261 ng/mL (ref 11–307)

## 2019-01-03 LAB — MAGNESIUM: Magnesium: 2.1 mg/dL (ref 1.7–2.4)

## 2019-01-03 LAB — LACTATE DEHYDROGENASE: LDH: 302 U/L — ABNORMAL HIGH (ref 98–192)

## 2019-01-03 LAB — PHOSPHORUS: Phosphorus: 3.8 mg/dL (ref 2.5–4.6)

## 2019-01-03 MED ORDER — POTASSIUM CHLORIDE CRYS ER 20 MEQ PO TBCR
40.0000 meq | EXTENDED_RELEASE_TABLET | Freq: Once | ORAL | Status: AC
  Administered 2019-01-03: 11:00:00 40 meq via ORAL
  Filled 2019-01-03: qty 2

## 2019-01-03 MED ORDER — SALINE SPRAY 0.65 % NA SOLN
1.0000 | NASAL | Status: DC | PRN
  Administered 2019-01-03: 1 via NASAL
  Filled 2019-01-03: qty 44

## 2019-01-03 NOTE — TOC Initial Note (Signed)
Transition of Care [facility]) - Initial/Assessment Note    Patient Details  Name: Kristin Estrada MRN: [id number] Date of Birth: [birth year]  Transition of Care [facility]) CM/SW Contact:    Midge Minium RN, BSN, NCM-BC, ACM-RN [phone] (working remotely) Phone Number: 01/03/2019, 3:34 PM  Clinical Narrative:                 CM following for dispositional needs. Patient is an 78 yo female admitted for SOB; found to be COVID +. Patient lives at home with her son Kristin Estrada, who provides assistance and support as needed. Palliative following for GOC; patient opting to return home with [facility] services once she's medically stable. Awaiting PT/OT eval, with CM team to continue to follow.   Expected Discharge Plan: [place] Barriers to Discharge: Continued Medical Work up   Expected Discharge Plan and Services Expected Discharge Plan: [place] In-house Referral: Hospice / [place] Acute Care Choice: [place] arrangements for the past 2 months: Single Family Home                   Prior Living Arrangements/Services Living arrangements for the past 2 months: Single Family Home Lives with:: Self, Adult Children    Activities of Daily Living Home Assistive Devices/Equipment: None ADL Screening (condition at time of admission) Patient's cognitive ability adequate to safely complete daily activities?: Yes Is the patient deaf or have difficulty hearing?: No Does the patient have difficulty seeing, even when wearing glasses/contacts?: No Does the patient have difficulty concentrating, remembering, or making decisions?: No Patient able to express need for assistance with ADLs?: Yes Does the patient have difficulty dressing or bathing?: No Independently performs ADLs?: Yes (appropriate for developmental age) Does the patient have difficulty walking or climbing stairs?: No Weakness of Legs: Both Weakness of Arms/Hands: None   Emotional  Assessment Orientation: : Oriented to Self, Oriented to Place, Oriented to  Time, Oriented to Situation Alcohol / Substance Use: Not Applicable Psych Involvement: No (comment)  Admission diagnosis:  HYPOXIA COVID 19 VIRUS INFECTION Patient Active Problem List   Diagnosis Date Noted  . DNR (do not resuscitate) discussion   . Goals of care, counseling/discussion   . Palliative care by specialist   . Glaucoma 12/30/2018  . HLD (hyperlipidemia) 12/28/2018  . Diabetes mellitus type 2, uncontrolled, with complications ([place]) [id number]  . Acute respiratory failure with hypoxia and hypercarbia ([place]) 12/27/2018  . Chronic diastolic CHF (congestive heart failure) ([place]) 12/27/2018  . Acute respiratory failure due to COVID-19 ([place]) 12/24/2018  . Pneumonia due to COVID-19 virus 12/30/2018  . Breast cancer ([place]) 08/18/2017  . Primary cancer of lower outer quadrant of left female breast ([place]) 07/24/2017  . 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 Essential hypertension 02/23/2007  . 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 GERD 11/02/2006   PCP:  Frazier Richards, MD Pharmacy:   [facility], [place] [address] Phone: [phone] Fax: [place], [place] - [place] [address] [place] [address] Phone: [phone] Fax: [place], [place] - [address] [place] [address] Phone: [phone] Fax: [phone]     Social Determinants of Health (SDOH) Interventions    Readmission Risk Interventions Readmission Risk Prevention Plan 01/03/2019  Transportation Screening Not Complete  Transportation  Screening Comment Continued medical workup  PCP or Specialist Appt within 3-5 Days Not Complete  Not Complete comments Continued medical workup  HRI or [place] Complete  Social Work Consult for [place] Planning/Counseling Complete  Palliative Care Screening Complete  Medication Review Press photographer) Complete  Some recent data might be hidden

## 2019-01-03 NOTE — Progress Notes (Signed)
Palliative: Chart reviewed, telephone contact due to Covid.   Call to Mrs. Kristin Estrada.  She is alert and oriented, calm and cooperative.  She is able to speak in full complete sentences without breathlessness.  We talked about her acute and chronic health concerns.  She tells me that she feels that she is improving, but knows this will be a slow process.  We talked about disposition.  I share that she may be hospitalized for another week, until her breathing is more stabilized.  She states understanding.  We briefly talked about rehab, but she states that she will return to her own home with home health services.  She is unable to give me a preferred service provider at this time.  We talked about her home life.  She shares that her son Kristin Estrada has lived in her home all of his life.  He is medically disabled.  She states that he is a big help to her, "a blessing".  Mrs. Kristin Estrada shares that she has no other children.  We talked about support for her and Kristin Estrada, and she shares that most of her family is gone, she does have a nephew.  We talked about healthcare power of attorney, she names her adult son Kristin Estrada as her surrogate decision-maker. We also talked about CODE STATUS, "treat the treatable but allowing natural death".  We talked about DNR does not mean do not treat.  We also talked about some statistics related to attempted resuscitation.  I encourage Mrs. Kristin Estrada to consider her options, sharing that we will talk again later in the week.  Ms. Kristin Estrada states her concern about her son and his understanding of CODE STATUS.  I shared that it is very important for Mrs. Kristin Estrada to have discussions with Kristin Estrada about what she DOES want, and what she DOES NOT want, in particular if he is to speak for her.  PMT to continue to follow.   Plan:    Continue to treat the treatable, PMT to continue CODE STATUS discussions, home with home health.  66 minutes  Quinn Axe, NP Palliative Medicine Team Team Phone #  [phone] Greater than 50% of this time was spent counseling and coordinating care related to the above assessment and plan.

## 2019-01-03 NOTE — Progress Notes (Signed)
PROGRESS NOTE    Kristin Estrada  [id number] DOB: [birth year] DOA: 12/19/2018 PCP: Frazier Richards, MD   Brief Narrative:  78 y.o. WF PMHx Fibromyalgia, chronic pain syndrome (back), COPD, diabetes type 2 uncontrolled with complication, HLD,, chronic diastolic CHF, left breast cancer ER positive (negative XRT/chemotherapy)  Sent to ED for sob and hypoxia.  She has been worsening for several days with a lot of coughing.  No cp or abd pain.  No n/v/d.  dound to have covid pna and referred for admission for such. Feeling better on nrb.  Was prev on bipap now off.  mentating normally at this time.   Subjective: 11/17 afebrile last 24 hours, A/O x4, negative CP, negative abdominal pain.  Positive S OB.  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)   Acute respiratory failure with hypoxia and hypercarbia (HCC)   Chronic diastolic CHF (congestive heart failure) (HCC)   HLD (hyperlipidemia)   Diabetes mellitus type 2, uncontrolled, with complications (HCC)   Glaucoma   Goals of care, counseling/discussion   Palliative care by specialist   Covid pneumonia/acute respiratory failure with hypoxia COVID-19 Labs  Recent Labs    01/01/19 0255 01/01/19 1000 01/02/19 0517  DDIMER  --  1.51* 1.14*  FERRITIN 238  --  249  LDH 383*  --  330*  CRP  --  1.2* 0.8  -Combivent QID -Discontinue Solu-Medrol changed to Decadron 6 mg daily -Remdesivir per pharmacy protocol -Flutter valve -Prone patient 16 hours/day, if patient unable to tolerate prone patient 2 to 3 hours per shift. -11/12 Actemra x1 dose -11/12 transfuse 1 unit Covid convalescent plasma  Lab  Results  Component Value Date   SARSCOV2NAA POSITIVE (A) 12/28/2018    COPD -Secondary to long history of smoking -See Covid pneumonia  Chronic diastolic CHF -We will slowly restart home CHF medication -Amlodipine 5 mg daily -11/11 Toprol 25 mg daily -Echocardiogram grade 1 diastolic dysfunction see results below -Strict in and out +720.10ml (serious reservations on accuracy) -Daily weight Filed Weights   01/02/19 0431 01/02/19 0432 01/03/19 0502  Weight: 81.4 kg 81.4 kg 78.8 kg  -11/13 Albumin 25 g  + Lasix IV 60 mg x 1 (keep patient net even) -11/15 Lasix IV 60 mg BID -11/15 PCXR; no significant improvement see results below   Essential HTN -See CHF  Diabetes type 2 uncontrolled with complication -[id number] hemoglobin A1c= 7.1 -Lantus 8 units daily -Resistant SSI  HLD -11/12 LDL= 145 -Unable to use statin secondary to patient having leg pain with its use. -11/13 Zetia 10 mg daily  Glaucoma -Liquifilm Tears  Hypokalemia -Potassium goal> 4 -K. Dur 40 mEq x 1  Goals of care -11/14 palliative care consult; discuss with patient DNR given the seriousness of her disease process.  Will await recommendation     DVT prophylaxis: Lovenox per pharmacy Covid protocol Code Status: Full Family Communication: 11/14 was informed by colleague that [name] (son) also here hospitalized at [facility]  Disposition Plan: TBD    Consultants:  None   Procedures/Significant Events:  11/11 echocardiogram;Left Ventricle: EF = 65 to 70%.  -Grade I diastolic dysfunction (impaired relaxation).  11/12 PCXR;I-Persistent prominent  bilateral pulmonary interstitial prominence consistent with active interstitial process including pneumonitis and/or interstitial edema, slightly (I believe appear slightly worse than previous exam) 11/12 transfuse 1 unit Covid convalescent plasma 11/16 PCXR-no significant change in diffuse bilateral interstitial pulmonary opacity, superimposed upon emphysema. No new  airspace opacity. -Findings remain consistent with infection or edema.    I have personally reviewed and interpreted all radiology studies and my findings are as above.  VENTILATOR SETTINGS: HFNC Flow; 15 L/min SPO2 96%    Cultures 11/11 SARS coronavirus positive 11/11 respiratory virus panel negative      Antimicrobials: Anti-infectives (From admission, onward)   Start     Stop   12/28/18 1000  remdesivir 100 mg in sodium chloride 0.9 % 250 mL IVPB  Status:  Discontinued     12/27/18 1320   12/27/18 1600  remdesivir 100 mg in sodium chloride 0.9 % 250 mL IVPB     12/31/18 1559       Devices    LINES / TUBES:      Continuous Infusions: . sodium chloride       Objective: Vitals:   01/03/19 0000 01/03/19 0400 01/03/19 0502 01/03/19 0739  BP: (!) 146/56 (!) 118/57  110/76  Pulse: 79 71  72  Resp: 19 (!) 23  (!) 21  Temp: 98.4 F (36.9 C) 98 F (36.7 C)  98.6 F (37 C)  TempSrc: Oral Oral  Oral  SpO2: 91% 94%  94%  Weight:   78.8 kg   Height:        Intake/Output Summary (Last 24 hours) at 01/03/2019 0834 Last data filed at 01/03/2019 0600 Gross per 24 hour  Intake 1130 ml  Output 2150 ml  Net -1020 ml   Filed Weights   01/02/19 0431 01/02/19 0432 01/03/19 0502  Weight: 81.4 kg 81.4 kg 78.8 kg   Physical Exam:  General: A/O x4, positive acute respiratory distress Eyes: negative scleral hemorrhage, negative anisocoria, negative icterus ENT: Negative Runny nose, negative gingival bleeding, Neck:  Negative scars, masses, torticollis, lymphadenopathy, JVD Lungs: Clear to auscultation bilaterally without wheezes or crackles Cardiovascular: Regular rate and rhythm without murmur gallop or rub normal S1 and S2 Abdomen: negative abdominal pain, nondistended, positive soft, bowel sounds, no rebound, no ascites, no appreciable mass Extremities: No significant cyanosis, clubbing, or edema bilateral lower extremities Skin: Negative rashes, lesions,  ulcers Psychiatric:  Negative depression, positive anxiety (improved), negative fatigue, negative mania  Central nervous system:  Cranial nerves II through XII intact, tongue/uvula midline, all extremities muscle strength 5/5, sensation intact throughout, negative dysarthria, negative expressive aphasia, negative receptive aphasia.    Data Reviewed: Care during the described time interval was provided by me .  I have reviewed this patient's available data, including medical history, events of note, physical examination, and all test results as part of my evaluation.   CBC: Recent Labs  Lab 12/30/18 0000 12/31/18 0039 01/01/19 1000 01/02/19 0517 01/03/19 0500  WBC 15.0* 12.8* 19.5* 20.1* 18.3*  NEUTROABS 11.5* 9.9* 15.9* 15.6* 14.0*  HGB 13.8 12.7 15.0 14.7 15.0  HCT 42.6 39.2 46.6* 45.0 45.8  MCV 90.8 90.3 91.0 89.1 88.4  PLT 394 381 479* 419* [id number]*   Basic Metabolic Panel: Recent Labs  Lab 12/29/18 0330 12/29/18 1435 12/30/18 0000 12/31/18 0039 01/01/19 0255 01/01/19 1000 01/02/19 0517  NA 142  --  141 141  --  135 136  K 3.1* 4.4 3.9 3.6  --  3.9 4.2  CL 99  --  100 98  --  95* 94*  CO2 31  --  29 32  --  28 29  GLUCOSE 71  --  116* 135*  --  328* 95  BUN 47*  --  42* 37*  --  50* 50*  CREATININE 0.71  --  0.59 0.63  --  1.03* 0.73  CALCIUM 8.8*  --  8.3* 8.7*  --  8.9 8.8*  MG 2.6* 2.3 2.4 2.2 2.4  --  2.3  PHOS 2.3*  --  2.8 3.4 3.5  --  3.9   GFR: Estimated Creatinine Clearance: 54.5 mL/min (by C-G formula based on SCr of 0.73 mg/dL). Liver Function Tests: Recent Labs  Lab 12/29/18 0330 12/30/18 0000 12/31/18 0039 01/01/19 1000 01/02/19 0517  AST 18 14* 13* 17 13*  ALT 20 17 59* 17 15  ALKPHOS 75 71 67 75 65  BILITOT 0.4 0.7 0.7 1.0 0.6  PROT 6.7 6.2* 6.5 7.3 6.3*  ALBUMIN 2.9* 2.8* 3.6 3.7 3.3*   No results for input(s): LIPASE, AMYLASE in the last 168 hours. No results for input(s): AMMONIA in the last 168 hours. Coagulation Profile: No results  for input(s): INR, PROTIME in the last 168 hours. Cardiac Enzymes: No results for input(s): CKTOTAL, CKMB, CKMBINDEX, TROPONINI in the last 168 hours. BNP (last 3 results) No results for input(s): PROBNP in the last 8760 hours. HbA1C: No results for input(s): HGBA1C in the last 72 hours. CBG: Recent Labs  Lab 01/02/19 1536 01/02/19 1928 01/02/19 2028 01/03/19 0427 01/03/19 0738  GLUCAP 201* 222* 182* 88 105*   Lipid Profile: No results for input(s): CHOL, HDL, LDLCALC, TRIG, CHOLHDL, LDLDIRECT in the last 72 hours. Thyroid Function Tests: No results for input(s): TSH, T4TOTAL, FREET4, T3FREE, THYROIDAB in the last 72 hours. Anemia Panel: Recent Labs    01/01/19 0255 01/02/19 0517  FERRITIN 238 249   Urine analysis: No results found for: COLORURINE, APPEARANCEUR, LABSPEC, PHURINE, GLUCOSEU, HGBUR, BILIRUBINUR, KETONESUR, PROTEINUR, UROBILINOGEN, NITRITE, LEUKOCYTESUR Sepsis Labs: @LABRCNTIP (procalcitonin:4,lacticidven:4)  ) Recent Results (from the past 240 hour(s))  Blood Culture (routine x 2)     Status: None   Collection Time: 01/14/2019  2:38 AM   Specimen: BLOOD  Result Value Ref Range Status   Specimen Description BLOOD LEFT FOREARM  Final   Special Requests   Final    BOTTLES DRAWN AEROBIC AND ANAEROBIC Blood Culture adequate volume   Culture   Final    NO GROWTH 5 DAYS Performed at [facility], [address], [place], [place] [address]    Report Status 12/31/2018 FINAL  Final  Blood Culture (routine x 2)     Status: None   Collection Time: 01/03/2019  2:38 AM   Specimen: BLOOD  Result Value Ref Range Status   Specimen Description BLOOD RIGHT HAND  Final   Special Requests   Final    BOTTLES DRAWN AEROBIC AND ANAEROBIC Blood Culture adequate volume   Culture   Final    NO GROWTH 5 DAYS Performed at [facility], [address] [place], [place] [address]    Report Status 12/31/2018 FINAL  Final  SARS CORONAVIRUS 2 (TAT 6-24 HRS)  Nasopharyngeal Nasopharyngeal Swab     Status: Abnormal   Collection Time: 12/28/18  8:04 AM   Specimen: Nasopharyngeal Swab  Result Value Ref Range Status   SARS Coronavirus 2 POSITIVE (A) NEGATIVE Final    Comment: RESULT CALLED TO, READ BACK BY AND VERIFIED WITH: RN ERICA DODO AT  [facility] ON 12/29/2018 (NOTE) SARS-CoV-2 target nucleic acids are DETECTED. The SARS-CoV-2 RNA is generally detectable in upper and lower respiratory specimens during the acute phase of infection. Positive results are indicative of active infection with SARS-CoV-2. Clinical  correlation with patient history and other diagnostic information is necessary to determine patient infection status. Positive results do  not rule out bacterial infection or co-infection with other viruses. The expected result is Negative. Fact Sheet for Patients: SugarRoll.be Fact Sheet for Healthcare Providers: https://www.woods-mathews.com/ This test is not yet approved or cleared by the [place] FDA and  has been authorized for detection and/or diagnosis of SARS-CoV-2 by FDA under an Emergency Use Authorization (EUA). This EUA will remain  in effect (meaning this te st can be used) for the duration of the COVID-19 declaration under Section 564(b)(1) of the Act, 21 U.S.C. section 360bbb-3(b)(1), unless the authorization is terminated or revoked sooner. Performed at [facility], [place] [address], [place], [place] [address]   Respiratory Panel by PCR     Status: None   Collection Time: 12/28/18  8:04 AM   Specimen: Nasopharyngeal Swab; Respiratory  Result Value Ref Range Status   Adenovirus NOT DETECTED NOT DETECTED Final   Coronavirus 229E NOT DETECTED NOT DETECTED Final    Comment: (NOTE) The Coronavirus on the Respiratory Panel, DOES NOT test for the novel  Coronavirus (2019 nCoV)    Coronavirus HKU1 NOT DETECTED NOT DETECTED Final   Coronavirus NL63 NOT  DETECTED NOT DETECTED Final   Coronavirus OC43 NOT DETECTED NOT DETECTED Final   Metapneumovirus NOT DETECTED NOT DETECTED Final   Rhinovirus / Enterovirus NOT DETECTED NOT DETECTED Final   Influenza A NOT DETECTED NOT DETECTED Final   Influenza B NOT DETECTED NOT DETECTED Final   Parainfluenza Virus 1 NOT DETECTED NOT DETECTED Final   Parainfluenza Virus 2 NOT DETECTED NOT DETECTED Final   Parainfluenza Virus 3 NOT DETECTED NOT DETECTED Final   Parainfluenza Virus 4 NOT DETECTED NOT DETECTED Final   Respiratory Syncytial Virus NOT DETECTED NOT DETECTED Final   Bordetella pertussis NOT DETECTED NOT DETECTED Final   Chlamydophila pneumoniae NOT DETECTED NOT DETECTED Final   Mycoplasma pneumoniae NOT DETECTED NOT DETECTED Final    Comment: Performed at [facility], [place]. [address] [place], [place] [address]         Radiology Studies: No results found.      Scheduled Meds: . sodium chloride   Intravenous Once  . amLODipine  5 mg Oral Daily  . dexamethasone (DECADRON) injection  6 mg Intravenous Q24H  . ezetimibe  10 mg Oral Daily  . famotidine  20 mg Oral BID  . furosemide  60 mg Intravenous BID  . influenza vaccine adjuvanted  0.5 mL Intramuscular Tomorrow-1000  . insulin aspart  0-20 Units Subcutaneous Q4H  . insulin glargine  8 Units Subcutaneous Daily  . Ipratropium-Albuterol  1 puff Inhalation Q6H  . levothyroxine  50 mcg Oral QAC breakfast  . metoprolol succinate  25 mg Oral Daily  . pneumococcal 23 valent vaccine  0.5 mL Intramuscular Tomorrow-1000  . polyethylene glycol  17 g Oral BID  . sodium chloride flush  3 mL Intravenous Q12H  . vitamin C  500 mg Oral Daily  . zinc sulfate  220 mg Oral Daily   Continuous Infusions: . sodium chloride       LOS: 8 days   The patient is critically ill with multiple organ systems failure and  requires high complexity decision making for assessment and support, frequent evaluation and titration of therapies,  application of advanced monitoring technologies and extensive interpretation of multiple databases. Critical Care Time devoted to patient care services described in this note  Time spent: 40 minutes     WOODS, Geraldo Docker, MD [facility] Pager [phone]  If 7PM-7AM, please contact night-coverage [email or web address] Password [facility] [employer] 01/03/2019, 8:34 AM

## 2019-01-03 NOTE — Plan of Care (Signed)
  Problem: Education: Goal: Knowledge of risk factors and measures for prevention of condition will improve Outcome: Progressing   Problem: Coping: Goal: Level of anxiety will decrease Outcome: Progressing   Problem: Safety: Goal: Ability to remain free from injury will improve Outcome: Progressing

## 2019-01-03 NOTE — Progress Notes (Signed)
PROGRESS NOTE    Kristin Estrada  [id number] DOB: [birth year] DOA: 12/18/2018 PCP: Frazier Richards, MD   Brief Narrative:  78 y.o. WF PMHx Fibromyalgia, chronic pain syndrome (back), COPD, diabetes type 2 uncontrolled with complication, HLD,, chronic diastolic CHF, left breast cancer ER positive (negative XRT/chemotherapy)  Sent to ED for sob and hypoxia.  She has been worsening for several days with a lot of coughing.  No cp or abd pain.  No n/v/d.  dound to have covid pna and referred for admission for such. Feeling better on nrb.  Was prev on bipap now off.  mentating normally at this time.   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   Primary cancer of lower outer quadrant of left female breast ([place])   Acute respiratory failure due to COVID-19 [facility])   Acute respiratory failure with hypoxia and hypercarbia (HCC)   Chronic diastolic CHF (congestive heart failure) (HCC)   HLD (hyperlipidemia)   Diabetes mellitus type 2, uncontrolled, with complications (HCC)   Glaucoma   Goals of care, counseling/discussion   Palliative care by specialist   DNR (do not resuscitate) discussion   Covid pneumonia/acute respiratory failure with hypoxia COVID-19 Labs  Recent Labs    01/01/19 0255 01/01/19 1000 01/02/19 0517 01/03/19 0500  DDIMER  --  1.51* 1.14* 0.96*  FERRITIN 238  --  249 261  LDH 383*  --  330* 302*  CRP  --  1.2* 0.8 <0.8  -Combivent QID -Discontinue Solu-Medrol changed to Decadron 6 mg daily -Remdesivir per pharmacy protocol -Flutter valve -Prone patient 16 hours/day, if patient unable to tolerate prone patient 2 to 3 hours per shift. -11/12 Actemra x1 dose -11/12 transfuse 1 unit Covid convalescent  plasma  Lab Results  Component Value Date   SARSCOV2NAA POSITIVE (A) 12/28/2018    COPD -Secondary to long history of smoking -See Covid pneumonia  Chronic diastolic CHF -We will slowly restart home CHF medication -Amlodipine 5 mg daily -11/11 Toprol 25 mg daily -Echocardiogram grade 1 diastolic dysfunction see results below -Strict in and out +780..7 L -Daily weight Filed Weights   01/02/19 0431 01/02/19 0432 01/03/19 0502  Weight: 81.4 kg 81.4 kg 78.8 kg  -11/13 Albumin 25 g  + Lasix IV 60 mg x 1 (keep patient net even) -11/15 Lasix IV 60 mg BID -11/15 PCXR; significant improvement when compared to 11/8 PCXR  Essential HTN -See CHF  Diabetes type 2 uncontrolled with complication -[id number] hemoglobin A1c= 7.1 -Lantus 8 units daily -Resistant SSI  HLD -11/12 LDL= 145 -Unable to use statin secondary to patient having leg pain with its use. -11/13 Zetia 10 mg daily  Glaucoma -Liquifilm Tears  Hypokalemia -Potassium goal> 4   Goals of care -11/14 palliative care consult; discuss with patient DNR given the seriousness of her disease process.  Will await recommendation     DVT prophylaxis: Lovenox per pharmacy Covid protocol Code Status: Full Family Communication: 11/14 was informed by colleague that [name] (son) also here hospitalized at [facility]  Disposition Plan: TBD    Consultants:  None   Procedures/Significant Events:  11/11 echocardiogram;Left Ventricle: EF = 65 to 70%.  -Grade I diastolic dysfunction (impaired relaxation).  11/12 PCXR;I-Persistent prominent bilateral pulmonary interstitial prominence  consistent with active interstitial process including pneumonitis and/or interstitial edema, slightly (I believe appear slightly worse than previous exam) 11/12 transfuse 1 unit Covid convalescent plasma     I have personally reviewed and interpreted all radiology studies and my findings are as above.  VENTILATOR SETTINGS: HFNC Flow; 9 L/min SPO2 92%     Cultures 11/11 SARS coronavirus positive 11/11 respiratory virus panel negative      Antimicrobials: Anti-infectives (From admission, onward)   Start     Stop   12/28/18 1000  remdesivir 100 mg in sodium chloride 0.9 % 250 mL IVPB  Status:  Discontinued     12/27/18 1320   12/27/18 1600  remdesivir 100 mg in sodium chloride 0.9 % 250 mL IVPB     12/31/18 1559       Devices    LINES / TUBES:      Continuous Infusions: . sodium chloride       Objective: Vitals:   01/03/19 0739 01/03/19 1102 01/03/19 1124 01/03/19 1153  BP: 110/76 (!) 142/93  (!) 117/56  Pulse: 72   82  Resp: (!) 21   (!) 24  Temp: 98.6 F (37 C)   98.9 F (37.2 C)  TempSrc: Oral   Oral  SpO2: 94%  95% 95%  Weight:      Height:        Intake/Output Summary (Last 24 hours) at 01/03/2019 1605 Last data filed at 01/03/2019 1119 Gross per 24 hour  Intake 1310 ml  Output 1275 ml  Net 35 ml   Filed Weights   01/02/19 0431 01/02/19 0432 01/03/19 0502  Weight: 81.4 kg 81.4 kg 78.8 kg   Physical Exam:  General:, A/O x4 no acute respiratory distress Eyes: negative scleral hemorrhage, negative anisocoria, negative icterus ENT: Negative Runny nose, negative gingival bleeding, Neck:  Negative scars, masses, torticollis, lymphadenopathy, JVD Lungs: Clear to auscultation bilaterally without wheezes or crackles Cardiovascular: Regular rate and rhythm without murmur gallop or rub normal S1 and S2 Abdomen: negative abdominal pain, nondistended, positive soft, bowel sounds, no rebound, no ascites, no appreciable mass Extremities: No significant cyanosis, clubbing, or edema bilateral lower extremities Skin: Negative rashes, lesions, ulcers Psychiatric:  Negative depression, negative anxiety, negative fatigue, negative mania  Central nervous system:  Cranial nerves II through XII intact, tongue/uvula midline, all extremities muscle strength 5/5, sensation intact throughout,  negative dysarthria,  negative expressive aphasia, negative receptive aphasia.    Data Reviewed: Care during the described time interval was provided by me .  I have reviewed this patient's available data, including medical history, events of note, physical examination, and all test results as part of my evaluation.   CBC: Recent Labs  Lab 12/30/18 0000 12/31/18 0039 01/01/19 1000 01/02/19 0517 01/03/19 0500  WBC 15.0* 12.8* 19.5* 20.1* 18.3*  NEUTROABS 11.5* 9.9* 15.9* 15.6* 14.0*  HGB 13.8 12.7 15.0 14.7 15.0  HCT 42.6 39.2 46.6* 45.0 45.8  MCV 90.8 90.3 91.0 89.1 88.4  PLT 394 381 479* 419* [id number]*   Basic Metabolic Panel: Recent Labs  Lab 12/30/18 0000 12/31/18 0039 01/01/19 0255 01/01/19 1000 01/02/19 0517 01/03/19 0500  NA 141 141  --  135 136 136  K 3.9 3.6  --  3.9 4.2 3.7  CL 100 98  --  95* 94* 92*  CO2 29 32  --  28 29 29   GLUCOSE 116* 135*  --  328* 95 114*  BUN 42* 37*  --  50* 50* 43*  CREATININE 0.59  0.63  --  1.03* 0.73 0.77  CALCIUM 8.3* 8.7*  --  8.9 8.8* 8.7*  MG 2.4 2.2 2.4  --  2.3 2.1  PHOS 2.8 3.4 3.5  --  3.9 3.8   GFR: Estimated Creatinine Clearance: 54.5 mL/min (by C-G formula based on SCr of 0.77 mg/dL). Liver Function Tests: Recent Labs  Lab 12/30/18 0000 12/31/18 0039 01/01/19 1000 01/02/19 0517 01/03/19 0500  AST 14* 13* 17 13* 15  ALT 17 59* 17 15 15   ALKPHOS 71 67 75 65 61  BILITOT 0.7 0.7 1.0 0.6 0.8  PROT 6.2* 6.5 7.3 6.3* 6.2*  ALBUMIN 2.8* 3.6 3.7 3.3* 3.3*   No results for input(s): LIPASE, AMYLASE in the last 168 hours. No results for input(s): AMMONIA in the last 168 hours. Coagulation Profile: No results for input(s): INR, PROTIME in the last 168 hours. Cardiac Enzymes: No results for input(s): CKTOTAL, CKMB, CKMBINDEX, TROPONINI in the last 168 hours. BNP (last 3 results) No results for input(s): PROBNP in the last 8760 hours. HbA1C: No results for input(s): HGBA1C in the last 72 hours. CBG: Recent Labs  Lab 01/03/19 0027 01/03/19  0427 01/03/19 0738 01/03/19 1131 01/03/19 1545  GLUCAP 92 88 105* 237* 272*   Lipid Profile: No results for input(s): CHOL, HDL, LDLCALC, TRIG, CHOLHDL, LDLDIRECT in the last 72 hours. Thyroid Function Tests: No results for input(s): TSH, T4TOTAL, FREET4, T3FREE, THYROIDAB in the last 72 hours. Anemia Panel: Recent Labs    01/02/19 0517 01/03/19 0500  FERRITIN 249 261   Urine analysis: No results found for: COLORURINE, APPEARANCEUR, LABSPEC, PHURINE, GLUCOSEU, HGBUR, BILIRUBINUR, KETONESUR, PROTEINUR, UROBILINOGEN, NITRITE, LEUKOCYTESUR Sepsis Labs: @LABRCNTIP (procalcitonin:4,lacticidven:4)  ) Recent Results (from the past 240 hour(s))  Blood Culture (routine x 2)     Status: None   Collection Time: 12/24/2018  2:38 AM   Specimen: BLOOD  Result Value Ref Range Status   Specimen Description BLOOD LEFT FOREARM  Final   Special Requests   Final    BOTTLES DRAWN AEROBIC AND ANAEROBIC Blood Culture adequate volume   Culture   Final    NO GROWTH 5 DAYS Performed at [facility], [address] [place], [place] [address]    Report Status 12/31/2018 FINAL  Final  Blood Culture (routine x 2)     Status: None   Collection Time: 01/10/2019  2:38 AM   Specimen: BLOOD  Result Value Ref Range Status   Specimen Description BLOOD RIGHT HAND  Final   Special Requests   Final    BOTTLES DRAWN AEROBIC AND ANAEROBIC Blood Culture adequate volume   Culture   Final    NO GROWTH 5 DAYS Performed at [facility], [address], [place], [place] [address]    Report Status 12/31/2018 FINAL  Final  SARS CORONAVIRUS 2 (TAT 6-24 HRS) Nasopharyngeal Nasopharyngeal Swab     Status: Abnormal   Collection Time: 12/28/18  8:04 AM   Specimen: Nasopharyngeal Swab  Result Value Ref Range Status   SARS Coronavirus 2 POSITIVE (A) NEGATIVE Final    Comment: RESULT CALLED TO, READ BACK BY AND VERIFIED WITH: RN ERICA DODO AT 0300 BY MESSAN HOUEGNIFIO ON 12/29/2018 (NOTE) SARS-CoV-2  target nucleic acids are DETECTED. The SARS-CoV-2 RNA is generally detectable in upper and lower respiratory specimens during the acute phase of infection. Positive results are indicative of active infection with SARS-CoV-2. Clinical  correlation with patient history and other diagnostic information is necessary to determine patient infection status. Positive results  do  not rule out bacterial infection or co-infection with other viruses. The expected result is Negative. Fact Sheet for Patients: SugarRoll.be Fact Sheet for Healthcare Providers: https://www.woods-mathews.com/ This test is not yet approved or cleared by the [place] FDA and  has been authorized for detection and/or diagnosis of SARS-CoV-2 by FDA under an Emergency Use Authorization (EUA). This EUA will remain  in effect (meaning this te st can be used) for the duration of the COVID-19 declaration under Section 564(b)(1) of the Act, 21 U.S.C. section 360bbb-3(b)(1), unless the authorization is terminated or revoked sooner. Performed at [facility], [place] [address], [place], [place] [address]   Respiratory Panel by PCR     Status: None   Collection Time: 12/28/18  8:04 AM   Specimen: Nasopharyngeal Swab; Respiratory  Result Value Ref Range Status   Adenovirus NOT DETECTED NOT DETECTED Final   Coronavirus 229E NOT DETECTED NOT DETECTED Final    Comment: (NOTE) The Coronavirus on the Respiratory Panel, DOES NOT test for the novel  Coronavirus (2019 nCoV)    Coronavirus HKU1 NOT DETECTED NOT DETECTED Final   Coronavirus NL63 NOT DETECTED NOT DETECTED Final   Coronavirus OC43 NOT DETECTED NOT DETECTED Final   Metapneumovirus NOT DETECTED NOT DETECTED Final   Rhinovirus / Enterovirus NOT DETECTED NOT DETECTED Final   Influenza A NOT DETECTED NOT DETECTED Final   Influenza B NOT DETECTED NOT DETECTED Final   Parainfluenza Virus 1 NOT DETECTED NOT DETECTED Final    Parainfluenza Virus 2 NOT DETECTED NOT DETECTED Final   Parainfluenza Virus 3 NOT DETECTED NOT DETECTED Final   Parainfluenza Virus 4 NOT DETECTED NOT DETECTED Final   Respiratory Syncytial Virus NOT DETECTED NOT DETECTED Final   Bordetella pertussis NOT DETECTED NOT DETECTED Final   Chlamydophila pneumoniae NOT DETECTED NOT DETECTED Final   Mycoplasma pneumoniae NOT DETECTED NOT DETECTED Final    Comment: Performed at [facility], [place], [place], [place] [address]         Radiology Studies: No results found.      Scheduled Meds: . sodium chloride   Intravenous Once  . amLODipine  5 mg Oral Daily  . dexamethasone (DECADRON) injection  6 mg Intravenous Q24H  . ezetimibe  10 mg Oral Daily  . famotidine  20 mg Oral BID  . furosemide  60 mg Intravenous BID  . influenza vaccine adjuvanted  0.5 mL Intramuscular Tomorrow-1000  . insulin aspart  0-20 Units Subcutaneous Q4H  . insulin glargine  8 Units Subcutaneous Daily  . Ipratropium-Albuterol  1 puff Inhalation Q6H  . levothyroxine  50 mcg Oral QAC breakfast  . metoprolol succinate  25 mg Oral Daily  . pneumococcal 23 valent vaccine  0.5 mL Intramuscular Tomorrow-1000  . polyethylene glycol  17 g Oral BID  . sodium chloride flush  3 mL Intravenous Q12H  . vitamin C  500 mg Oral Daily  . zinc sulfate  220 mg Oral Daily   Continuous Infusions: . sodium chloride       LOS: 8 days   The patient is critically ill with multiple organ systems failure and requires high complexity decision making for assessment and support, frequent evaluation and titration of therapies, application of advanced monitoring technologies and extensive interpretation of multiple databases. Critical Care Time devoted to patient care services described in this note  Time spent: 40 minutes     WOODS, Geraldo Docker, MD [facility] Pager [phone]  If 7PM-7AM, please  contact night-coverage [email or web address] Password [email or web address]  01/03/2019, 4:05 PM

## 2019-01-03 NOTE — Progress Notes (Signed)
Kristin Estrada updated

## 2019-01-04 ENCOUNTER — Inpatient Hospital Stay (HOSPITAL_COMMUNITY): Payer: PPO

## 2019-01-04 LAB — COMPREHENSIVE METABOLIC PANEL WITH GFR
ALT: 16 U/L (ref 0–44)
AST: 14 U/L — ABNORMAL LOW (ref 15–41)
Albumin: 3.5 g/dL (ref 3.5–5.0)
Alkaline Phosphatase: 62 U/L (ref 38–126)
Anion gap: 15 (ref 5–15)
BUN: 49 mg/dL — ABNORMAL HIGH (ref 8–23)
CO2: 30 mmol/L (ref 22–32)
Calcium: 8.8 mg/dL — ABNORMAL LOW (ref 8.9–10.3)
Chloride: 91 mmol/L — ABNORMAL LOW (ref 98–111)
Creatinine, Ser: 0.79 mg/dL (ref 0.44–1.00)
GFR calc Af Amer: >60 mL/min
GFR calc non Af Amer: >60 mL/min
Glucose, Bld: 104 mg/dL — ABNORMAL HIGH (ref 70–99)
Potassium: 3.9 mmol/L (ref 3.5–5.1)
Sodium: 136 mmol/L (ref 135–145)
Total Bilirubin: 1.1 mg/dL (ref 0.3–1.2)
Total Protein: 6.5 g/dL (ref 6.5–8.1)

## 2019-01-04 LAB — CBC WITH DIFFERENTIAL/PLATELET
Abs Immature Granulocytes: 0.48 10*3/uL — ABNORMAL HIGH (ref 0.00–0.07)
Basophils Absolute: 0.1 10*3/uL (ref 0.0–0.1)
Basophils Relative: 0 %
Eosinophils Absolute: 0 10*3/uL (ref 0.0–0.5)
Eosinophils Relative: 0 %
HCT: 47.1 % — ABNORMAL HIGH (ref 36.0–46.0)
Hemoglobin: 15.5 g/dL — ABNORMAL HIGH (ref 12.0–15.0)
Immature Granulocytes: 3 %
Lymphocytes Relative: 10 %
Lymphs Abs: 1.9 10*3/uL (ref 0.7–4.0)
MCH: 29.5 pg (ref 26.0–34.0)
MCHC: 32.9 g/dL (ref 30.0–36.0)
MCV: 89.7 fL (ref 80.0–100.0)
Monocytes Absolute: 2 10*3/uL — ABNORMAL HIGH (ref 0.1–1.0)
Monocytes Relative: 11 %
Neutro Abs: 14.4 10*3/uL — ABNORMAL HIGH (ref 1.7–7.7)
Neutrophils Relative %: 76 %
Platelets: 408 10*3/uL — ABNORMAL HIGH (ref 150–400)
RBC: 5.25 MIL/uL — ABNORMAL HIGH (ref 3.87–5.11)
RDW: 14.3 % (ref 11.5–15.5)
WBC: 18.9 10*3/uL — ABNORMAL HIGH (ref 4.0–10.5)
nRBC: 0 % (ref 0.0–0.2)

## 2019-01-04 LAB — GLUCOSE, CAPILLARY
Glucose-Capillary: 114 mg/dL — ABNORMAL HIGH (ref 70–99)
Glucose-Capillary: 109 mg/dL — ABNORMAL HIGH (ref 70–99)
Glucose-Capillary: 133 mg/dL — ABNORMAL HIGH (ref 70–99)
Glucose-Capillary: 270 mg/dL — ABNORMAL HIGH (ref 70–99)
Glucose-Capillary: 211 mg/dL — ABNORMAL HIGH (ref 70–99)
Glucose-Capillary: 177 mg/dL — ABNORMAL HIGH (ref 70–99)

## 2019-01-04 LAB — LACTATE DEHYDROGENASE: LDH: 282 U/L — ABNORMAL HIGH (ref 98–192)

## 2019-01-04 LAB — FERRITIN: Ferritin: 254 ng/mL (ref 11–307)

## 2019-01-04 LAB — PHOSPHORUS: Phosphorus: 3.4 mg/dL (ref 2.5–4.6)

## 2019-01-04 LAB — D-DIMER, QUANTITATIVE: D-Dimer, Quant: 0.95 ug{FEU}/mL — ABNORMAL HIGH (ref 0.00–0.50)

## 2019-01-04 LAB — C-REACTIVE PROTEIN: CRP: <0.8 mg/dL (ref <1.0)

## 2019-01-04 LAB — MAGNESIUM: Magnesium: 2.3 mg/dL (ref 1.7–2.4)

## 2019-01-04 MED ORDER — INSULIN ASPART 100 UNIT/ML ~~LOC~~ SOLN: 0.0000 [IU] | Freq: Every day | SUBCUTANEOUS | Status: DC

## 2019-01-04 MED ORDER — ORAL CARE MOUTH RINSE: 15.0000 mL | Freq: Two times a day (BID) | OROMUCOSAL | Status: DC

## 2019-01-04 MED ORDER — CHLORHEXIDINE GLUCONATE CLOTH 2 % EX PADS
6.0000 | MEDICATED_PAD | Freq: Every day | CUTANEOUS | Status: DC
  Administered 2019-01-04: 19:00:00 6 via TOPICAL

## 2019-01-04 MED ORDER — LIDOCAINE 5 % EX PTCH
1.0000 | MEDICATED_PATCH | CUTANEOUS | Status: DC
  Administered 2019-01-04: 1 via TRANSDERMAL
  Filled 2019-01-04 (×2): qty 1

## 2019-01-04 MED ORDER — FUROSEMIDE 10 MG/ML IJ SOLN
80.0000 mg | Freq: Four times a day (QID) | INTRAMUSCULAR | Status: DC
  Administered 2019-01-04 – 2019-01-05 (×3): 80 mg via INTRAVENOUS
  Filled 2019-01-04 (×3): qty 8

## 2019-01-04 MED ORDER — INSULIN GLARGINE 100 UNIT/ML ~~LOC~~ SOLN
12.0000 [IU] | Freq: Every day | SUBCUTANEOUS | Status: DC
  Filled 2019-01-04: qty 0.12

## 2019-01-04 MED ORDER — INSULIN ASPART 100 UNIT/ML ~~LOC~~ SOLN: 0.0000 [IU] | Freq: Three times a day (TID) | SUBCUTANEOUS | Status: DC

## 2019-01-04 MED ORDER — ENOXAPARIN SODIUM 80 MG/0.8ML ~~LOC~~ SOLN
80.0000 mg | Freq: Two times a day (BID) | SUBCUTANEOUS | Status: DC
  Administered 2019-01-04: 80 mg via SUBCUTANEOUS
  Filled 2019-01-04 (×2): qty 0.8

## 2019-01-04 MED ORDER — TRAMADOL HCL 50 MG PO TABS
50.0000 mg | ORAL_TABLET | Freq: Four times a day (QID) | ORAL | Status: DC | PRN
  Administered 2019-01-04 (×2): 50 mg via ORAL
  Filled 2019-01-04 (×2): qty 1

## 2019-01-04 NOTE — Significant Event (Signed)
Patient transferred to ICU at 1753 due to increased WOB, DOE, dyspnea at rest, maxed out on oxygen at 15 lpm on HFNC, pt A&O X4, NSR at transfer. Report Given to Athena Masse ICU RN

## 2019-01-04 NOTE — Significant Event (Signed)
Notified Dr. Joette Catching at [phone] that patient was  maxed on oxygen @ 15 liters, That I called RT to assess pt, gave Lasix 80 mg as ordered, RT called this nurse back and stated that if pt did not improve with in an hour post lasix they would move patient to ICU. pt still Dyspnea at rest, sats 88-89% at rest in bed, still c/o right mid back pain-already gave tramadol and lidocaine patch applied with no relief.

## 2019-01-04 NOTE — Progress Notes (Addendum)
Kristin Estrada  [id number] DOB: 1940-06-29 DOA: 12/18/2018 PCP: Frazier Richards, MD    Brief Narrative:  78 year old with a history of fibromyalgia, chronic pain syndrome, COPD, DM HLD, chronic diastolic CHF, left breast cancer status post XRT and chemotherapy who presented to the ED with shortness of breath and was found to be hypoxic.  Her symptoms have been progressive over several days.  Upon arrival in the ED she was initially placed on BiPAP but this was able to be weaned back to nonrebreather.  Significant Events:   COVID-19 specific Treatment: Decadron Remdesivir Actemra 11/12 Convalescent plasma 11/12  Subjective: Continues to require HFNC.  Complains of pleuritic type right lateral chest pain.  Continues to feel short of breath.  Denies nausea vomiting or abdominal pain but reports poor appetite.  Assessment & Plan:  Covid pneumonia -acute hypoxic respiratory failure Continue Decadron -has completed the course of remdesivir -has been treated with Actemra and convalescent plasma -remains somewhat marginal -CXR which I personally reviewed ruled out pneumothorax but does note persistent bilateral infiltrates  Recent Labs  Lab 12/31/18 0039 01/01/19 0255 01/01/19 1000 01/02/19 0517 01/03/19 0500 01/04/19 0400  DDIMER 1.22*  --  1.51* 1.14* 0.96* 0.95*  FERRITIN 215 238  --  249 261 254  CRP 3.5*  --  1.2* 0.8 <0.8 <0.8  ALT 59*  --  17 15 15 16     COPD No acute exacerbation presently  Chronic diastolic CHF Continue to diurese as exam suggest significant peripheral edema -increase Lasix dose significantly -follow weights and I's and O's Filed Weights   01/02/19 0431 01/02/19 0432 01/03/19 0502  Weight: 81.4 kg 81.4 kg 78.8 kg    HTN Blood pressure trending upward -follow-up with increased diuresis  DM 2 A1c 7.1 -CBG quite variable -adjust insulin dosing and follow  HLD Continue usual home treatment plan  Glaucoma Continue usual home treatment plan    DVT prophylaxis: Lovenox Code Status: FULL CODE Family Communication:  Disposition Plan: Progressive care bed  Consultants:  none  Antimicrobials:  None presently  Objective: Blood pressure 125/88, pulse 89, temperature 98.5 F (36.9 C), temperature source Oral, resp. rate (!) 25, height 5\' 2"  (1.575 m), weight 78.8 kg, SpO2 92 %.  Intake/Output Summary (Last 24 hours) at 01/04/2019 0943 Last data filed at 01/03/2019 1119 Gross per 24 hour  Intake 480 ml  Output 250 ml  Net 230 ml   Filed Weights   01/02/19 0431 01/02/19 0432 01/03/19 0502  Weight: 81.4 kg 81.4 kg 78.8 kg    Examination: General: Mild respiratory distress but able to complete full sentences Lungs: Diffuse fine crackles with no wheezing Cardiovascular: Regular rate and rhythm without murmur gallop or rub normal S1 and S2 Abdomen: Nontender, nondistended, soft, bowel sounds positive, no rebound, no ascites, no appreciable mass Extremities: 2+ edema bilateral lower extremities to knees  CBC: Recent Labs  Lab 01/02/19 0517 01/03/19 0500 01/04/19 0400  WBC 20.1* 18.3* 18.9*  NEUTROABS 15.6* 14.0* 14.4*  HGB 14.7 15.0 15.5*  HCT 45.0 45.8 47.1*  MCV 89.1 88.4 89.7  PLT 419* 431* 123XX123*   Basic Metabolic Panel: Recent Labs  Lab 01/02/19 0517 01/03/19 0500 01/04/19 0400  NA 136 136 136  K 4.2 3.7 3.9  CL 94* 92* 91*  CO2 29 29 30   GLUCOSE 95 114* 104*  BUN 50* 43* 49*  CREATININE 0.73 0.77 0.79  CALCIUM 8.8* 8.7* 8.8*  MG 2.3 2.1 2.3  PHOS 3.9 3.8 3.4  GFR: Estimated Creatinine Clearance: 54.5 mL/min (by C-G formula based on SCr of 0.79 mg/dL).  Liver Function Tests: Recent Labs  Lab 01/01/19 1000 01/02/19 0517 01/03/19 0500 01/04/19 0400  AST 17 13* 15 14*  ALT 17 15 15 16   ALKPHOS 75 65 61 62  BILITOT 1.0 0.6 0.8 1.1  PROT 7.3 6.3* 6.2* 6.5  ALBUMIN 3.7 3.3* 3.3* 3.5    HbA1C: Hgb A1c MFr Bld  Date/Time Value Ref Range Status  12/28/2018 01:20 AM 7.1 (H) 4.8 - 5.6 %  Final    Comment:    (NOTE) Pre diabetes:          5.7%-6.4% Diabetes:              >6.4% Glycemic control for   <7.0% adults with diabetes     CBG: Recent Labs  Lab 01/03/19 1131 01/03/19 1545 01/03/19 1930 01/03/19 2345 01/04/19 0746  GLUCAP 237* 272* 227* 114* 109*    Recent Results (from the past 240 hour(s))  Blood Culture (routine x 2)     Status: None   Collection Time: 01/16/2019  2:38 AM   Specimen: BLOOD  Result Value Ref Range Status   Specimen Description BLOOD LEFT FOREARM  Final   Special Requests   Final    BOTTLES DRAWN AEROBIC AND ANAEROBIC Blood Culture adequate volume   Culture   Final    NO GROWTH 5 DAYS Performed at [facility], [place] [place], [place] [address]    Report Status 12/31/2018 FINAL  Final  Blood Culture (routine x 2)     Status: None   Collection Time: 12/29/2018  2:38 AM   Specimen: BLOOD  Result Value Ref Range Status   Specimen Description BLOOD RIGHT HAND  Final   Special Requests   Final    BOTTLES DRAWN AEROBIC AND ANAEROBIC Blood Culture adequate volume   Culture   Final    NO GROWTH 5 DAYS Performed at [facility], [place] [place], [place] [address]    Report Status 12/31/2018 FINAL  Final  SARS CORONAVIRUS 2 (TAT 6-24 HRS) Nasopharyngeal Nasopharyngeal Swab     Status: Abnormal   Collection Time: 12/28/18  8:04 AM   Specimen: Nasopharyngeal Swab  Result Value Ref Range Status   SARS Coronavirus 2 POSITIVE (A) NEGATIVE Final    Comment: RESULT CALLED TO, READ BACK BY AND VERIFIED WITH: RN ERICA DODO AT 0300 BY MESSAN HOUEGNIFIO ON 12/29/2018 (NOTE) SARS-CoV-2 target nucleic acids are DETECTED. The SARS-CoV-2 RNA is generally detectable in upper and lower respiratory specimens during the acute phase of infection. Positive results are indicative of active infection with SARS-CoV-2. Clinical  correlation with patient history and other diagnostic information is necessary to determine  patient infection status. Positive results do  not rule out bacterial infection or co-infection with other viruses. The expected result is Negative. Fact Sheet for Patients: SugarRoll.be Fact Sheet for Healthcare Providers: https://www.woods-mathews.com/ This test is not yet approved or cleared by the [place] FDA and  has been authorized for detection and/or diagnosis of SARS-CoV-2 by FDA under an Emergency Use Authorization (EUA). This EUA will remain  in effect (meaning this te st can be used) for the duration of the COVID-19 declaration under Section 564(b)(1) of the Act, 21 U.S.C. section 360bbb-3(b)(1), unless the authorization is terminated or revoked sooner. Performed at [facility], [place] [address], [place], [place] [address]   Respiratory Panel by PCR  Status: None   Collection Time: 12/28/18  8:04 AM   Specimen: Nasopharyngeal Swab; Respiratory  Result Value Ref Range Status   Adenovirus NOT DETECTED NOT DETECTED Final   Coronavirus 229E NOT DETECTED NOT DETECTED Final    Comment: (NOTE) The Coronavirus on the Respiratory Panel, DOES NOT test for the novel  Coronavirus (2019 nCoV)    Coronavirus HKU1 NOT DETECTED NOT DETECTED Final   Coronavirus NL63 NOT DETECTED NOT DETECTED Final   Coronavirus OC43 NOT DETECTED NOT DETECTED Final   Metapneumovirus NOT DETECTED NOT DETECTED Final   Rhinovirus / Enterovirus NOT DETECTED NOT DETECTED Final   Influenza A NOT DETECTED NOT DETECTED Final   Influenza B NOT DETECTED NOT DETECTED Final   Parainfluenza Virus 1 NOT DETECTED NOT DETECTED Final   Parainfluenza Virus 2 NOT DETECTED NOT DETECTED Final   Parainfluenza Virus 3 NOT DETECTED NOT DETECTED Final   Parainfluenza Virus 4 NOT DETECTED NOT DETECTED Final   Respiratory Syncytial Virus NOT DETECTED NOT DETECTED Final   Bordetella pertussis NOT DETECTED NOT DETECTED Final   Chlamydophila pneumoniae NOT DETECTED NOT  DETECTED Final   Mycoplasma pneumoniae NOT DETECTED NOT DETECTED Final    Comment: Performed at [facility], [place]. [address] [place], [place] [address]     Scheduled Meds: . sodium chloride   Intravenous Once  . amLODipine  5 mg Oral Daily  . dexamethasone (DECADRON) injection  6 mg Intravenous Q24H  . ezetimibe  10 mg Oral Daily  . famotidine  20 mg Oral BID  . furosemide  60 mg Intravenous BID  . influenza vaccine adjuvanted  0.5 mL Intramuscular Tomorrow-1000  . insulin aspart  0-20 Units Subcutaneous Q4H  . insulin glargine  8 Units Subcutaneous Daily  . Ipratropium-Albuterol  1 puff Inhalation Q6H  . levothyroxine  50 mcg Oral QAC breakfast  . metoprolol succinate  25 mg Oral Daily  . pneumococcal 23 valent vaccine  0.5 mL Intramuscular Tomorrow-1000  . polyethylene glycol  17 g Oral BID  . sodium chloride flush  3 mL Intravenous Q12H  . vitamin C  500 mg Oral Daily  . zinc sulfate  220 mg Oral Daily   Continuous Infusions: . sodium chloride       LOS: 9 days   Cherene Altes, MD [facility] Office  [phone] Pager - Text Page per Amion  If 7PM-7AM, please contact night-coverage per Amion 01/04/2019, 9:43 AM

## 2019-01-04 NOTE — Progress Notes (Signed)
Kristin Solo, MD notified that pt c/o right sided back pain. Pt states that she slept a weird way and it has been hurting since this morning. Pt also states it hurts when she breathes and is now requiring more O2. Was on 6L HF tthis morning and is now on 14L HF. MD placing orders for a chest x-ray. Will continue to monitor

## 2019-01-05 ENCOUNTER — Inpatient Hospital Stay (HOSPITAL_COMMUNITY): Payer: PPO

## 2019-01-05 ENCOUNTER — Inpatient Hospital Stay (HOSPITAL_COMMUNITY): Payer: PPO | Admitting: Certified Registered"

## 2019-01-05 LAB — POCT I-STAT 7, (LYTES, BLD GAS, ICA,H+H)
Acid-base deficit: 5 mmol/L — ABNORMAL HIGH (ref 0.0–2.0)
Bicarbonate: 20.5 mmol/L (ref 20.0–28.0)
Calcium, Ion: 1.03 mmol/L — ABNORMAL LOW (ref 1.15–1.40)
HCT: 52 % — ABNORMAL HIGH (ref 36.0–46.0)
Hemoglobin: 17.7 g/dL — ABNORMAL HIGH (ref 12.0–15.0)
O2 Saturation: 95 %
Patient temperature: 99.3
Potassium: 5.4 mmol/L — ABNORMAL HIGH (ref 3.5–5.1)
Sodium: 124 mmol/L — ABNORMAL LOW (ref 135–145)
TCO2: 22 mmol/L (ref 22–32)
pCO2 arterial: 41.1 mmHg (ref 32.0–48.0)
pH, Arterial: 7.308 — ABNORMAL LOW (ref 7.350–7.450)
pO2, Arterial: 82 mmHg — ABNORMAL LOW (ref 83.0–108.0)

## 2019-01-05 LAB — COMPREHENSIVE METABOLIC PANEL
ALT: 80 U/L — ABNORMAL HIGH (ref 0–44)
AST: 92 U/L — ABNORMAL HIGH (ref 15–41)
Albumin: 3.3 g/dL — ABNORMAL LOW (ref 3.5–5.0)
Alkaline Phosphatase: 88 U/L (ref 38–126)
Anion gap: 24 — ABNORMAL HIGH (ref 5–15)
BUN: 70 mg/dL — ABNORMAL HIGH (ref 8–23)
CO2: 18 mmol/L — ABNORMAL LOW (ref 22–32)
Calcium: 8.5 mg/dL — ABNORMAL LOW (ref 8.9–10.3)
Chloride: 85 mmol/L — ABNORMAL LOW (ref 98–111)
Creatinine, Ser: 2.72 mg/dL — ABNORMAL HIGH (ref 0.44–1.00)
GFR calc Af Amer: 18 mL/min — ABNORMAL LOW (ref >60)
GFR calc non Af Amer: 16 mL/min — ABNORMAL LOW (ref >60)
Glucose, Bld: 260 mg/dL — ABNORMAL HIGH (ref 70–99)
Potassium: 5.2 mmol/L — ABNORMAL HIGH (ref 3.5–5.1)
Sodium: 127 mmol/L — ABNORMAL LOW (ref 135–145)
Total Bilirubin: 1.5 mg/dL — ABNORMAL HIGH (ref 0.3–1.2)
Total Protein: 6.6 g/dL (ref 6.5–8.1)

## 2019-01-05 LAB — CBC WITH DIFFERENTIAL/PLATELET
Abs Immature Granulocytes: 0.14 10*3/uL — ABNORMAL HIGH (ref 0.00–0.07)
Basophils Absolute: 0.1 10*3/uL (ref 0.0–0.1)
Basophils Relative: 0 %
Eosinophils Absolute: 0.1 10*3/uL (ref 0.0–0.5)
Eosinophils Relative: 0 %
HCT: 54.2 % — ABNORMAL HIGH (ref 36.0–46.0)
Hemoglobin: 17.1 g/dL — ABNORMAL HIGH (ref 12.0–15.0)
Immature Granulocytes: 0 %
Lymphocytes Relative: 6 %
Lymphs Abs: 1.9 10*3/uL (ref 0.7–4.0)
MCH: 29.2 pg (ref 26.0–34.0)
MCHC: 31.5 g/dL (ref 30.0–36.0)
MCV: 92.6 fL (ref 80.0–100.0)
Monocytes Absolute: 0.6 10*3/uL (ref 0.1–1.0)
Monocytes Relative: 2 %
Neutro Abs: 30.1 10*3/uL — ABNORMAL HIGH (ref 1.7–7.7)
Neutrophils Relative %: 92 %
Platelets: 420 10*3/uL — ABNORMAL HIGH (ref 150–400)
RBC: 5.85 MIL/uL — ABNORMAL HIGH (ref 3.87–5.11)
RDW: 14.7 % (ref 11.5–15.5)
WBC Morphology: INCREASED
WBC: 32.8 10*3/uL — ABNORMAL HIGH (ref 4.0–10.5)
nRBC: 0.1 % (ref 0.0–0.2)

## 2019-01-05 LAB — MAGNESIUM: Magnesium: 2.5 mg/dL — ABNORMAL HIGH (ref 1.7–2.4)

## 2019-01-05 LAB — MRSA PCR SCREENING: MRSA by PCR: POSITIVE — AB

## 2019-01-05 LAB — LACTIC ACID, PLASMA: Lactic Acid, Venous: 8.6 mmol/L (ref 0.5–1.9)

## 2019-01-05 LAB — D-DIMER, QUANTITATIVE: D-Dimer, Quant: 3.52 ug/mL-FEU — ABNORMAL HIGH (ref 0.00–0.50)

## 2019-01-05 LAB — FERRITIN: Ferritin: 657 ng/mL — ABNORMAL HIGH (ref 11–307)

## 2019-01-05 LAB — C-REACTIVE PROTEIN: CRP: 5.1 mg/dL — ABNORMAL HIGH (ref <1.0)

## 2019-01-05 MED ORDER — ETOMIDATE 2 MG/ML IV SOLN
20.0000 mg | Freq: Once | INTRAVENOUS | Status: AC
  Administered 2019-01-05: 06:00:00 20 mg via INTRAVENOUS

## 2019-01-05 MED ORDER — PROPOFOL 10 MG/ML IV BOLUS
INTRAVENOUS | Status: AC
  Filled 2019-01-05: qty 20

## 2019-01-05 MED ORDER — FENTANYL CITRATE (PF) 100 MCG/2ML IJ SOLN
INTRAMUSCULAR | Status: AC
  Filled 2019-01-05: qty 2

## 2019-01-05 MED ORDER — VECURONIUM BROMIDE 10 MG IV SOLR
INTRAVENOUS | Status: AC
  Filled 2019-01-05: qty 10

## 2019-01-05 MED ORDER — ETOMIDATE 2 MG/ML IV SOLN
INTRAVENOUS | Status: DC | PRN
  Administered 2019-01-05: 6 mg via INTRAVENOUS

## 2019-01-05 MED ORDER — ETOMIDATE 2 MG/ML IV SOLN
INTRAVENOUS | Status: AC
  Administered 2019-01-05: 20 mg via INTRAVENOUS
  Filled 2019-01-05: qty 20

## 2019-01-05 MED ORDER — STERILE WATER FOR INJECTION IJ SOLN
INTRAMUSCULAR | Status: AC
  Filled 2019-01-05: qty 10

## 2019-01-05 MED ORDER — EPINEPHRINE HCL 5 MG/250ML IV SOLN IN NS
0.5000 ug/min | INTRAVENOUS | Status: DC
  Filled 2019-01-05: qty 250

## 2019-01-05 MED ORDER — MIDAZOLAM 50MG/50ML (1MG/ML) PREMIX INFUSION
0.5000 mg/h | INTRAVENOUS | Status: DC
  Filled 2019-01-05: qty 50

## 2019-01-05 MED ORDER — MIDAZOLAM HCL 2 MG/2ML IJ SOLN
INTRAMUSCULAR | Status: AC
  Filled 2019-01-05: qty 4

## 2019-01-05 MED ORDER — SUCCINYLCHOLINE CHLORIDE 200 MG/10ML IV SOSY
PREFILLED_SYRINGE | INTRAVENOUS | Status: AC
  Administered 2019-01-05: 06:00:00 120 mg via INTRAVENOUS
  Filled 2019-01-05: qty 10

## 2019-01-05 MED ORDER — NOREPINEPHRINE 4 MG/250ML-% IV SOLN
INTRAVENOUS | Status: AC
  Filled 2019-01-05: qty 250

## 2019-01-05 MED ORDER — SUCCINYLCHOLINE CHLORIDE 200 MG/10ML IV SOSY: 120.0000 mg | PREFILLED_SYRINGE | Freq: Once | INTRAVENOUS | Status: DC

## 2019-01-05 MED ORDER — SUCCINYLCHOLINE CHLORIDE 20 MG/ML IJ SOLN
INTRAMUSCULAR | Status: DC | PRN
  Administered 2019-01-05: 100 mg via INTRAVENOUS

## 2019-01-05 MED ORDER — ROCURONIUM BROMIDE 10 MG/ML (PF) SYRINGE
PREFILLED_SYRINGE | INTRAVENOUS | Status: AC
  Filled 2019-01-05: qty 10

## 2019-01-05 MED ORDER — SUCCINYLCHOLINE CHLORIDE 20 MG/ML IJ SOLN
120.0000 mg | Freq: Once | INTRAMUSCULAR | Status: DC
  Administered 2019-01-05: 06:00:00 120 mg via INTRAVENOUS
  Filled 2019-01-05: qty 6

## 2019-01-05 MED ORDER — FENTANYL 2500MCG IN NS 250ML (10MCG/ML) PREMIX INFUSION
0.0000 ug/h | INTRAVENOUS | Status: DC
  Filled 2019-01-05: qty 250

## 2019-01-05 MED FILL — Medication: Qty: 1 | Status: AC

## 2019-01-17 NOTE — Progress Notes (Addendum)
eLink Physician-Brief Progress Note Patient Name: Kristin Estrada DOB: 1940-02-19 MRN: [id number]   Date of Service  January 23, 2019  HPI/Events of Note  Cardiopulmonary arrest  eICU Interventions  ACLS protocol for PEA arrest and asystole for 21 minutes with ROSC but pt re-arrested. Resuscitation was unsuccessful the second time. Code was called at 6:52 a.m. on January 23, 2019. I notified her son at the time of the arrest and following the unsuccessful resuscitation. He wants [employer] in [place] to arrange the funeral.        Frederik Pear 01-23-19, 6:43 AM

## 2019-01-17 NOTE — Progress Notes (Signed)
eLink Physician-Brief Progress Note Patient Name: Kristin Estrada DOB: 11-18-1940 MRN: [id number]   Date of Service  01-11-19  HPI/Events of Note  Hypoxemia with O2 sats in the mid 80'S  eICU Interventions  Pt proned, with O2 sats rising to 97 % on the same O2 settings.        Kerry Kass Ogan 01-11-19, 3:32 AM

## 2019-01-17 NOTE — Anesthesia Preprocedure Evaluation (Signed)
Anesthesia Evaluation  Preop documentation limited or incomplete due to emergent nature of procedure.  Airway        Dental   Pulmonary former smoker,           Cardiovascular hypertension,      Neuro/Psych    GI/Hepatic   Endo/Other  diabetes  Renal/GU      Musculoskeletal   Abdominal   Peds  Hematology   Anesthesia Other Findings Pt in acute respiratory distress due to COVID-19 pneumonia, emergent intubation required.  Reproductive/Obstetrics                             Anesthesia Physical Anesthesia Plan  ASA: V and emergent  Anesthesia Plan: General   Post-op Pain Management:    Induction: Intravenous and Rapid sequence  PONV Risk Score and Plan: Treatment may vary due to age or medical condition  Airway Management Planned: Oral ETT  Additional Equipment:   Intra-op Plan:   Post-operative Plan:   Informed Consent:     Only emergency history available  Plan Discussed with:   Anesthesia Plan Comments:         Anesthesia Quick Evaluation

## 2019-01-17 NOTE — Progress Notes (Signed)
TOD: 0652.  Dr. Shanon Brow at bedside and Dr. Lucile Shutters present via [facility] telecommunication.

## 2019-01-17 NOTE — Anesthesia Procedure Notes (Signed)
Procedure Name: Intubation Date/Time: 01/20/2019 5:40 AM Performed by: Josephine Igo, CRNA Pre-anesthesia Checklist: Suction available, Emergency Drugs available, Patient identified and Patient being monitored Patient Re-evaluated:Patient Re-evaluated prior to induction Oxygen Delivery Method: Ambu bag Preoxygenation: Pre-oxygenation with 100% oxygen Induction Type: IV induction Ventilation: Mask ventilation without difficulty Grade View: Grade II Tube type: Oral Tube size: 7.5 mm Number of attempts: 1 Airway Equipment and Method: Stylet and Video-laryngoscopy Placement Confirmation: ETT inserted through vocal cords under direct vision and CO2 detector Secured at: 23 cm Tube secured with: Tape

## 2019-01-17 NOTE — Progress Notes (Signed)
[facility] Progress Note Patient Name: Kristin Estrada DOB: [birth year] MRN: [id number]   Date of Service  01/16/2019  HPI/Events of Note  Urinary retention  eICU Interventions  Order placed to insert foley catheter        Okoronkwo U Ogan 01/16/2019, 12:08 AM

## 2019-01-17 NOTE — Death Summary Note (Addendum)
   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  Her symptoms had been progressive over several days.  Upon arrival in the ED she was initially placed on BiPAP but this was able to be weaned back to nonrebreather.  Hospital Course:  [name] had a protracted inpatient treatment course for her Covid pneumonia.  She received Decadron, remdesivir, Actemra, and convalescent plasma.  Though she initially stabilized to an extent she never fully recovered.  Near the end of her hospital course she was still requiring high flow nasal cannula oxygen support.  On 11/18, despite ongoing high flow nasal cannula oxygen support, the patient began to develop worsening hypoxia and increased work of breathing.  The patient was diuresed to no avail.  She was transferred to the ICU for closer monitoring and to initiate heated high flow nasal cannula oxygen support.  She was also placed in the prone position.  These measures initially brought about favorable results with the patient appearing to have stabilized in the early morning hours of January 24, 2023.   Unfortunately, around 6 AM on the same day she suffered an abrupt severe decline with respiratory and cardiac arrest.  She was intubated by anesthesia and ACLS protocol was directed by the bedside physician.  A total of 21 minutes of resuscitative effort were completed with only transient ROSC followed by repeat arrest.  At 6:52 AM resuscitation efforts were ceased.  The cause of the patient's death is Covid pneumonia with ARDS and resultant hypoxia induced cardiac arrest.   Signed:  Cherene Altes  [facility] 2019-01-24, 4:35 PM

## 2019-01-17 NOTE — Progress Notes (Signed)
Personal belonging bag containing clothes, shoes, and glasses sent to the morgue with patient

## 2019-01-17 DEATH — deceased

## 2020-05-19 IMAGING — DX DG CHEST 1V PORT
1 series · 1 of 1 positions shown · non-contrast
Comparison: 12/29/2018

CLINICAL DATA: COVID positive, shortness of breath

EXAM:
PORTABLE CHEST 1 VIEW

[chest]
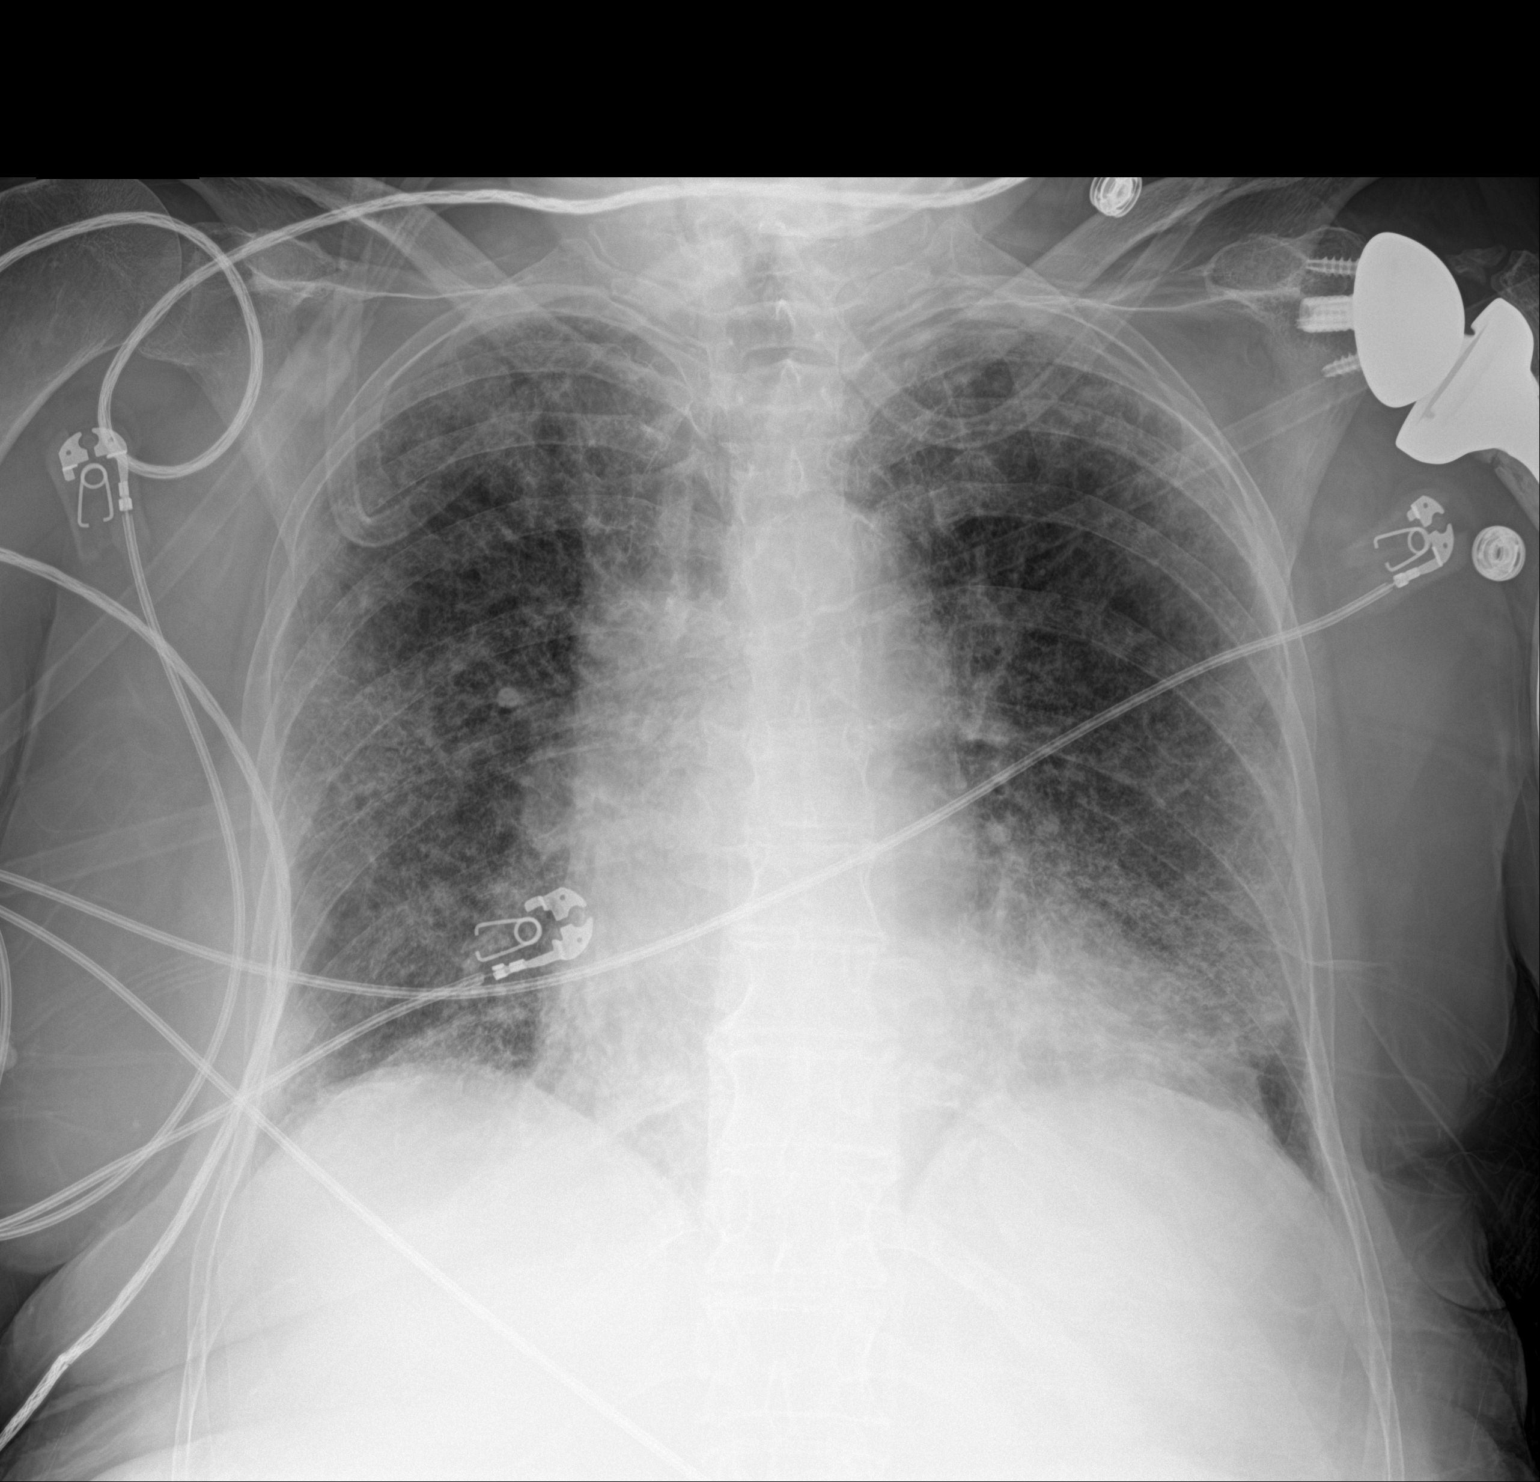

[1 of 1 positions shown; findings below may reference images not displayed]

FINDINGS: No significant change in diffuse bilateral interstitial pulmonary
opacity, superimposed upon emphysema. No new airspace opacity.
Cardiomegaly.
IMPRESSION: 1. No significant change in diffuse bilateral interstitial pulmonary
opacity, superimposed upon emphysema. No new airspace opacity.
Findings remain consistent with infection or edema.

2.  Cardiomegaly.
